# Patient Record
Sex: Female | Born: 1960 | Race: White | Hispanic: No | Smoking: Former smoker
Health system: Southern US, Community
[De-identification: ages and names within clinical notes are randomized; demographics above are authoritative.]

## PROBLEM LIST (undated history)

## (undated) DIAGNOSIS — I69351 Hemiplegia and hemiparesis following cerebral infarction affecting right dominant side: Secondary | ICD-10-CM

## (undated) DIAGNOSIS — R9430 Abnormal result of cardiovascular function study, unspecified: Secondary | ICD-10-CM

## (undated) DIAGNOSIS — R531 Weakness: Secondary | ICD-10-CM

## (undated) DIAGNOSIS — R4701 Aphasia: Secondary | ICD-10-CM

## (undated) DIAGNOSIS — I69320 Aphasia following cerebral infarction: Secondary | ICD-10-CM

## (undated) DIAGNOSIS — E0500 Thyrotoxicosis with diffuse goiter without thyrotoxic crisis or storm: Secondary | ICD-10-CM

## (undated) DIAGNOSIS — E785 Hyperlipidemia, unspecified: Secondary | ICD-10-CM

## (undated) DIAGNOSIS — G629 Polyneuropathy, unspecified: Secondary | ICD-10-CM

## (undated) DIAGNOSIS — L400 Psoriasis vulgaris: Secondary | ICD-10-CM

## (undated) DIAGNOSIS — S43001A Unspecified subluxation of right shoulder joint, initial encounter: Secondary | ICD-10-CM

## (undated) DIAGNOSIS — W19XXXA Unspecified fall, initial encounter: Secondary | ICD-10-CM

## (undated) DIAGNOSIS — I10 Essential (primary) hypertension: Secondary | ICD-10-CM

## (undated) DIAGNOSIS — R931 Abnormal findings on diagnostic imaging of heart and coronary circulation: Secondary | ICD-10-CM

## (undated) DIAGNOSIS — I429 Cardiomyopathy, unspecified: Secondary | ICD-10-CM

## (undated) DIAGNOSIS — I639 Cerebral infarction, unspecified: Secondary | ICD-10-CM

## (undated) DIAGNOSIS — I69891 Dysphagia following other cerebrovascular disease: Secondary | ICD-10-CM

## (undated) DIAGNOSIS — R1310 Dysphagia, unspecified: Secondary | ICD-10-CM

## (undated) DIAGNOSIS — Z8673 Personal history of transient ischemic attack (TIA), and cerebral infarction without residual deficits: Secondary | ICD-10-CM

## (undated) HISTORY — DX: Aphasia following cerebral infarction: I69.320

## (undated) HISTORY — DX: Unspecified fall, initial encounter: W19.XXXA

## (undated) HISTORY — DX: Hemiplegia and hemiparesis following cerebral infarction affecting right dominant side: I69.351

## (undated) HISTORY — DX: Aphasia: R47.01

## (undated) HISTORY — DX: Abnormal result of cardiovascular function study, unspecified: R94.30

## (undated) HISTORY — DX: Cardiomyopathy, unspecified: I42.9

## (undated) HISTORY — DX: Thyrotoxicosis with diffuse goiter without thyrotoxic crisis or storm: E05.00

## (undated) HISTORY — PX: NO PAST SURGERIES: SHX2092

## (undated) HISTORY — DX: Dysphagia following other cerebrovascular disease: I69.891

## (undated) HISTORY — DX: Unspecified subluxation of right shoulder joint, initial encounter: S43.001A

## (undated) HISTORY — DX: Psoriasis vulgaris: L40.0

## (undated) HISTORY — DX: Polyneuropathy, unspecified: G62.9

## (undated) HISTORY — DX: Weakness: R53.1

## (undated) HISTORY — DX: Cerebral infarction, unspecified: I63.9

## (undated) HISTORY — DX: Dysphagia, unspecified: R13.10

## (undated) HISTORY — DX: Hyperlipidemia, unspecified: E78.5

## (undated) HISTORY — DX: Personal history of transient ischemic attack (TIA), and cerebral infarction without residual deficits: Z86.73

## (undated) HISTORY — DX: Essential (primary) hypertension: I10

## (undated) HISTORY — DX: Abnormal findings on diagnostic imaging of heart and coronary circulation: R93.1

---

## 2017-04-24 DIAGNOSIS — I429 Cardiomyopathy, unspecified: Secondary | ICD-10-CM

## 2017-04-24 HISTORY — DX: Cardiomyopathy, unspecified: I42.9

## 2017-05-19 DIAGNOSIS — I639 Cerebral infarction, unspecified: Secondary | ICD-10-CM

## 2017-05-19 DIAGNOSIS — R1310 Dysphagia, unspecified: Secondary | ICD-10-CM

## 2017-05-19 DIAGNOSIS — I69320 Aphasia following cerebral infarction: Secondary | ICD-10-CM

## 2017-05-19 DIAGNOSIS — G629 Polyneuropathy, unspecified: Secondary | ICD-10-CM

## 2017-05-19 HISTORY — DX: Cerebral infarction, unspecified: I63.9

## 2017-05-19 HISTORY — DX: Polyneuropathy, unspecified: G62.9

## 2017-05-19 HISTORY — DX: Aphasia following cerebral infarction: I69.320

## 2017-05-19 HISTORY — DX: Dysphagia, unspecified: R13.10

## 2017-09-16 ENCOUNTER — Ambulatory Visit (HOSPITAL_BASED_OUTPATIENT_CLINIC_OR_DEPARTMENT_OTHER)
Admission: RE | Admit: 2017-09-16 | Discharge: 2017-09-16 | Disposition: A | Discharge status: Home / Self Care | Payer: BLUE CROSS/BLUE SHIELD | Source: Ambulatory Visit | Attending: Family Medicine | Admitting: Family Medicine

## 2017-09-16 ENCOUNTER — Ambulatory Visit (INDEPENDENT_AMBULATORY_CARE_PROVIDER_SITE_OTHER): Payer: BLUE CROSS/BLUE SHIELD | Admitting: Family Medicine

## 2017-09-16 ENCOUNTER — Encounter: Payer: Self-pay | Admitting: Family Medicine

## 2017-09-16 VITALS — BP 109/74 | HR 81 | Temp 97.7°F | Ht 62.0 in | Wt 125.6 lb

## 2017-09-16 DIAGNOSIS — I429 Cardiomyopathy, unspecified: Secondary | ICD-10-CM

## 2017-09-16 DIAGNOSIS — M25511 Pain in right shoulder: Secondary | ICD-10-CM

## 2017-09-16 DIAGNOSIS — R531 Weakness: Secondary | ICD-10-CM

## 2017-09-16 DIAGNOSIS — R4701 Aphasia: Secondary | ICD-10-CM

## 2017-09-16 DIAGNOSIS — L400 Psoriasis vulgaris: Secondary | ICD-10-CM

## 2017-09-16 DIAGNOSIS — E0500 Thyrotoxicosis with diffuse goiter without thyrotoxic crisis or storm: Secondary | ICD-10-CM | POA: Diagnosis not present

## 2017-09-16 DIAGNOSIS — R9430 Abnormal result of cardiovascular function study, unspecified: Secondary | ICD-10-CM

## 2017-09-16 DIAGNOSIS — W19XXXA Unspecified fall, initial encounter: Secondary | ICD-10-CM

## 2017-09-16 DIAGNOSIS — I69351 Hemiplegia and hemiparesis following cerebral infarction affecting right dominant side: Secondary | ICD-10-CM | POA: Diagnosis not present

## 2017-09-16 DIAGNOSIS — R931 Abnormal findings on diagnostic imaging of heart and coronary circulation: Secondary | ICD-10-CM | POA: Diagnosis not present

## 2017-09-16 DIAGNOSIS — G629 Polyneuropathy, unspecified: Secondary | ICD-10-CM

## 2017-09-16 DIAGNOSIS — Z7689 Persons encountering health services in other specified circumstances: Secondary | ICD-10-CM

## 2017-09-16 DIAGNOSIS — S43001A Unspecified subluxation of right shoulder joint, initial encounter: Secondary | ICD-10-CM | POA: Diagnosis not present

## 2017-09-16 DIAGNOSIS — I69391 Dysphagia following cerebral infarction: Secondary | ICD-10-CM | POA: Diagnosis not present

## 2017-09-16 DIAGNOSIS — I639 Cerebral infarction, unspecified: Secondary | ICD-10-CM | POA: Diagnosis not present

## 2017-09-16 DIAGNOSIS — Z8673 Personal history of transient ischemic attack (TIA), and cerebral infarction without residual deficits: Secondary | ICD-10-CM

## 2017-09-16 NOTE — Progress Notes (Signed)
Patient ID: Margaret Ortiz, female  DOB: Nov 08, 1960, 57 y.o.   MRN: [id number] Patient Care Team    Relationship Specialty Notifications Start End  Ma Hillock, DO PCP - General Family Medicine  09/16/17     Chief Complaint  Patient presents with  . Establish Care    pt c/o shoulder pain due to falling down 4 days ago. she states that she also fell two months ago and dislocated her shoulder    Subjective:  Margaret Ortiz is a 57 y.o.  female present for new patient establishment. All past medical history, surgical history, allergies, family history, immunizations, medications and social history were obtained and entered in the electronic medical record today. All recent labs, ED visits and hospitalizations within the last year were reviewed.  Patient is seen today for acute compliant and establishment of care only. She many chronic medical conditions that will need to be addressed on return visit.  History is obtained through sister and brother-in-law, secondary to patient suffering from a aphasia from recent stroke.  She has just moved to this area to live with her sister secondary to disability after her stroke.  No records were received prior to appointment today.  Once received all records will be loaded into electronic medical record and history update again.  Fall: Patient's family reports she fell 4 days ago after being her into their home.  They did not seek immediate treatment.  They state she fell backwards and landed in a bush, fell on her right shoulder.  This is the same shoulder she had dislocated 2 months prior when she had her stroke.  Asked to point to the location of discomfort she points to her deltoid and mid tricep area of her right arm.  Her arm is in a sling today and she is holding her right arm with her left arm.  She has deficits of her right side secondary to stroke 2 months ago.  They are concerned she has a dislocated shoulder.  History of recent CVA with  right hemiparesis: Patient was found down May 19, 2017 was found to be secondary to a left MCA stroke.  Was felt to likely have been cardioembolic from undiagnosed cardiomyopathy since her ejection fraction was found to be 15-20%.  Repeat echo on May 23, 2017 showed improved ejection fraction of 20-25%.  She suffered from dysphasia required an NG tube intially.  He was admitted to acute rehab December 4 to July 01, 2017.  She was upgraded to a regular diet with thin liquids via MBS on June 27, 2017.  Apparently ambulated with assistance at acute rehab.  She had been following in the outpatient setting with  visits including cardiology and neurology appointments. Otherwise she stayed at a SAR ([facility]) until 09/11/2017 when she was discharged and moved to [facility].   Depression screen [facility] 2/9 09/16/2017  Decreased Interest 0  Down, Depressed, Hopeless 0  PHQ - 2 Score 0   No flowsheet data found.   Exercise limited by: neurologic condition(s);cardiac condition(s) Fall Risk  09/16/2017  Falls in the past year? Yes  Number falls in past yr: 2 or more  Injury with Fall? Yes  Comment shoulder dislocation.  Risk Factor Category  High Fall Risk  Risk for fall due to : History of fall(s);Impaired balance/gait;Impaired mobility  Follow up Education provided;Falls evaluation completed     There is no immunization history on file for this patient.  No exam data present  Past Medical  History:  Diagnosis Date  . 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  . Cardiac LV ejection fraction 21-30%   . Cardioembolic stroke ([place]) 54/00/8676   Right hemiparesis  . Cardiomyopathy ([place]) 04/2017  . Dysphagia 05/19/2017   Last known diet upgraded to regular diet with thin liquids on 06/27/2017, no records available  . Graves disease   . Hyperlipidemia   . Hypertension   . Neuropathy 05/19/2017   s/p stroke  . Plaque  psoriasis    Allergies  Allergen Reactions  . Dust Mite Extract   . Penicillins    Past Surgical History:  Procedure Laterality Date  . NO PAST SURGERIES     Family History  Problem Relation Age of Onset  . Hyperlipidemia Mother   . Hypertension Mother   . Early death Father   . Hypertension Sister   . Heart attack Brother   . Mental illness Brother    Social History   Socioeconomic History  . Marital status: Divorced    Spouse name: Not on file  . Number of children: Not on file  . Years of education: 56  . Highest education level: Not on file  Occupational History  . 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 Financial resource strain: Not on file  . Food insecurity:    Worry: Not on file    Inability: Not on file  . Transportation needs:    Medical: Not on file    Non-medical: Not on file  Tobacco Use  . Smoking status: Former Research scientist (life sciences)  . Smokeless tobacco: Never Used  Substance and Sexual Activity  . Alcohol use: Never    Frequency: Never  . Drug use: Never  . Sexual activity: Not Currently  Lifestyle  . Physical activity:    Days per week: Not on file    Minutes per session: Not on file  . Stress: Not on file  Relationships  . Social connections:    Talks on phone: Not on file    Gets together: Not on file    Attends religious service: Not on file    Active member of club or organization: Not on file    Attends meetings of clubs or organizations: Not on file    Relationship status: Not on file  . Intimate partner violence:    Fear of current or ex partner: Not on file    Emotionally abused: Not on file    Physically abused: Not on file    Forced sexual activity: Not on file  Other Topics Concern  . Not on file  Social History Narrative   Divorced.  From [place], moved to [place] to stay with her family after having a stroke March 2019   Graduate degree, works as a Community education officer.   Former smoker.   Exercise routinely prior to stroke.     Wears a hearing aid.   Smoke alarms in the home.   Wears her seatbelt.   Allergies as of 09/16/2017      Reactions   Dust Mite Extract    Penicillins       Medication List        Accurate as of 09/16/17 11:59 PM. Always use your most recent med list.          ACETAMINOPHEN PO Take by mouth.   acetaminophen 325 MG tablet Commonly known as:  TYLENOL Take 650 mg by mouth  every 6 (six) hours as needed.   atorvastatin 40 MG tablet Commonly known as:  LIPITOR Take 40 mg by mouth daily.   docusate sodium 100 MG capsule Commonly known as:  COLACE Take 100 mg by mouth 2 (two) times daily.   ELIQUIS 5 MG Tabs tablet Generic drug:  apixaban Take 5 mg by mouth 2 (two) times daily.   esomeprazole 40 MG capsule Commonly known as:  NEXIUM Take 40 mg by mouth daily at 12 noon.   gabapentin 300 MG capsule Commonly known as:  NEURONTIN Take 300 mg by mouth 1 day or 1 dose.   senna 8.6 MG Tabs tablet Commonly known as:  SENOKOT Take 1 tablet by mouth.   sodium chloride 0.65 % Soln nasal spray Commonly known as:  OCEAN Place 1 spray into both nostrils as needed for congestion.   traMADol 50 MG tablet Commonly known as:  ULTRAM Take by mouth every 4 (four) hours as needed.       All past medical history, surgical history, allergies, family history, immunizations andmedications were updated in the EMR today and reviewed under the history and medication portions of their EMR.    No results found for this or any previous visit (from the past 2160 hour(s)).  Patient was never admitted.   ROS: 14 pt review of systems performed and negative (unless mentioned in an HPI)  Objective: BP 109/74 (BP Location: Left Arm, Patient Position: Sitting, Cuff Size: Normal)   Pulse 81   Temp 97.7 F (36.5 C) (Oral)   Ht 5\' 2"  (1.575 m)   Wt 125 lb 9.6 oz (57 kg)   SpO2 95%   BMI 22.97 kg/m  Gen: Afebrile. No acute distress. Nontoxic in appearance, well-developed, well-nourished,  in a wheelchair.  Difficult to communicate secondary to aphasia. HENT: AT. [place].  MMM, no oral lesions, no cough on exam Eyes:Pupils Equal Round Reactive to light, Extraocular movements intact,  Conjunctiva without redness, discharge or icterus. Neck/lymp/endocrine: Supple CV: RRR no murmurs, rubs or gallops, trace edema Chest: CTAB, no wheeze, rhonchi or crackles. Abd: Soft. NTND. BS present  skin: No bruising.  Warm and well-perfused. Skin intact. Neuro/Msk:  In wheel chair, rt. Hemiparesis, aphasia.  Normal hearing. PERLA. EOMi. Alert. Oriented.  Guarding right arm/shoulder.  difficult to assess shoulder without known mobility.  Patient moves upper body with shoulder when attempting to manipulate uncertain if this protective or she does not understand the directions. Psych: Normal affect, dress and demeanor. Normal speech. Normal thought content and judgment.   Assessment/plan: Margaret Ortiz is a 57 y.o. female present for new patient establishment with initial encounter for a fall in a very complicated patient unknown to this provider.  Extremely limited records available (1 paragraph in a CIR summary) and history mostly obtained from family members present today.  Pt Unable to communicate secondary to aphasia, making case even more difficult. Fall, initial encounter/ Acute pain of right shoulder/Shoulder subluxation, right, initial encounter -The instability and weakness secondary to recent stroke right hemiparesis.  Recently moved into a new location,  unfamiliar with her surroundings fell on right shoulder.  Of note, right shoulder subluxation occurred after stroke as well. 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  There is requested from neurology and cardiology,  as well as PCP.  In obvious need  of home health resources for medication management, PT/OT/speech therapy, safety eval, eval and treat, gait training and strengthening. -Prescribed atorvastatin 40 mg daily, Eliquis 5 mg twice daily. -Gabapentin 300 mg nightly and tramadol 50 mg every 4 hours as needed. - Ambulatory referral to [facility] - Ambulatory referral to Neurology - Ambulatory referral to Cardiology  Cardiomyopathy, unspecified type (HCC)/Cardiac LV ejection fraction 21-30% Extremely limited records.  Records requested.  Last known ejection fraction of 20-25% May 23, 2017.  Cardiomyopathy present.  Cardioembolic stroke. -Only taking atorvastatin 40 mg daily, Eliquis 5 mg twice daily. - Ambulatory referral to Cardiology  Graves disease Awaiting further records  Neuropathy Currently on gabapentin 300 mg nightly - Ambulatory referral to Neurology  Plaque psoriasis Awaiting records   Return in about 2 weeks (around 09/30/2017) for [facility] est.  Greater than 60 minutes was spent with patient, greater than 50% of that time was spent face-to-face with patient counseling and coordinating care.   Note is dictated utilizing voice recognition software. Although note has been proof read prior to signing, occasional typographical errors still can be missed. If any questions arise, please do not hesitate to call for verification.  Electronically signed by: Howard Pouch, DO [employer]

## 2017-09-16 NOTE — Patient Instructions (Signed)
Please have xray completed at [facility] on [address].  After I get these results we will call you and make a plan.  We will also request all records and start referrals to cardiology and neurology and therapy.    Please help [place] help you:  We are honored you have chosen [facility] for your Primary Care home. Below you will find basic instructions that you may need to access in the future. Please help [place] help you by reading the instructions, which cover many of the frequent questions we experience.   Prescription refills and request:  -In order to allow more efficient response time, please call your pharmacy for all refills. They will forward the request electronically to [place]. This allows for the quickest possible response. Request left on a nurse line can take longer to refill, since these are checked as time allows between office patients and other phone calls.  - refill request can take up to 3-5 working days to complete.  - If request is sent electronically and request is appropiate, it is usually completed in 1-2 business days.  - all patients will need to be seen routinely for all chronic medical conditions requiring prescription medications (see follow-up below). If you are overdue for follow up on your condition, you will be asked to make an appointment and we will call in enough medication to cover you until your appointment (up to 30 days).  - all controlled substances will require a face to face visit to request/refill.  - if you desire your prescriptions to go through a new pharmacy, and have an active script at original pharmacy, you will need to call your pharmacy and have scripts transferred to new pharmacy. This is completed between the pharmacy locations and not by your provider.    Results: If any images or labs were ordered, it can take up to 1 week to get results depending on the test ordered and the lab/facility running and resulting the test. - Normal or stable  results, which do not need further discussion, may be released to your mychart immediately with attached note to you. A call may not be generated for normal results. Please make certain to sign up for mychart. If you have questions on how to activate your mychart you can call the front office.  - If your results need further discussion, our office will attempt to contact you via phone, and if unable to reach you after 2 attempts, we will release your abnormal result to your mychart with instructions.  - All results will be automatically released in mychart after 1 week.  - Your provider will provide you with explanation and instruction on all relevant material in your results. Please keep in mind, results and labs may appear confusing or abnormal to the untrained eye, but it does not mean they are actually abnormal for you personally. If you have any questions about your results that are not covered, or you desire more detailed explanation than what was provided, you should make an appointment with your provider to do so.   Our office handles many outgoing and incoming calls daily. If we have not contacted you within 1 week about your results, please check your mychart to see if there is a message first and if not, then contact our office.  In helping with this matter, you help decrease call volume, and therefore allow [place] to be able to respond to patients needs more efficiently.   Acute office visits (  sick visit):  An acute visit is intended for a new problem and are scheduled in shorter time slots to allow schedule openings for patients with new problems. This is the appropriate visit to discuss a new problem. In order to provide you with excellent quality medical care with proper time for you to explain your problem, have an exam and receive treatment with instructions, these appointments should be limited to one new problem per visit. If you experience a new problem, in which you desire to be addressed,  please make an acute office visit, we save openings on the schedule to accommodate you. Please do not save your new problem for any other type of visit, let us take care of it properly and quickly for you.   Follow up visits:  Depending on your condition(s) your provider will need to see you routinely in order to provide you with quality care and prescribe medication(s). Most chronic conditions (Example: hypertension, Diabetes, depression/anxiety... etc), require visits a couple times a year. Your provider will instruct you on proper follow up for your personal medical conditions and history. Please make certain to make follow up appointments for your condition as instructed. Failing to do so could result in lapse in your medication treatment/refills. If you request a refill, and are overdue to be seen on a condition, we will always provide you with a 30 day script (once) to allow you time to schedule.    Medicare wellness (well visit): - we have a wonderful Nurse Maudie Mercury), that will meet with you and provide you will yearly medicare wellness visits. These visits should occur yearly (can not be scheduled less than 1 calendar year apart) and cover preventive health, immunizations, advance directives and screenings you are entitled to yearly through your medicare benefits. Do not miss out on your entitled benefits, this is when medicare will pay for these benefits to be ordered for you.  These are strongly encouraged by your provider and is the appropriate type of visit to make certain you are up to date with all preventive health benefits. If you have not had your medicare wellness exam in the last 12 months, please make certain to schedule one by calling the office and schedule your medicare wellness with Maudie Mercury as soon as possible.   Yearly physical (well visit):  - Adults are recommended to be seen yearly for physicals. Check with your insurance and date of your last physical, most insurances require one  calendar year between physicals. Physicals include all preventive health topics, screenings, medical exam and labs that are appropriate for gender/age and history. You may have fasting labs needed at this visit. This is a well visit (not a sick visit), new problems should not be covered during this visit (see acute visit).  - Pediatric patients are seen more frequently when they are younger. Your provider will advise you on well child visit timing that is appropriate for your their age. - This is not a medicare wellness visit. Medicare wellness exams do not have an exam portion to the visit. Some medicare companies allow for a physical, some do not allow a yearly physical. If your medicare allows a yearly physical you can schedule the medicare wellness with our nurse Maudie Mercury and have your physical with your provider after, on the same day. Please check with insurance for your full benefits.   Late Policy/No Shows:  - all new patients should arrive 15-30 minutes earlier than appointment to allow [place] time  to  obtain all  personal demographics,  insurance information and for you to complete office paperwork. - All established patients should arrive 10-15 minutes earlier than appointment time to update all information and be checked in .  - In our best efforts to run on time, if you are late for your appointment you will be asked to either reschedule or if able, we will work you back into the schedule. There will be a wait time to work you back in the schedule,  depending on availability.  - If you are unable to make it to your appointment as scheduled, please call 24 hours ahead of time to allow [place] to fill the time slot with someone else who needs to be seen. If you do not cancel your appointment ahead of time, you may be charged a no show fee.

## 2017-09-17 ENCOUNTER — Telehealth: Payer: Self-pay | Admitting: Family Medicine

## 2017-09-17 ENCOUNTER — Encounter: Payer: Self-pay | Admitting: Family Medicine

## 2017-09-17 DIAGNOSIS — I69891 Dysphagia following other cerebrovascular disease: Secondary | ICD-10-CM

## 2017-09-17 DIAGNOSIS — Z8673 Personal history of transient ischemic attack (TIA), and cerebral infarction without residual deficits: Secondary | ICD-10-CM

## 2017-09-17 DIAGNOSIS — R4701 Aphasia: Secondary | ICD-10-CM | POA: Insufficient documentation

## 2017-09-17 DIAGNOSIS — S43001A Unspecified subluxation of right shoulder joint, initial encounter: Secondary | ICD-10-CM

## 2017-09-17 DIAGNOSIS — W19XXXA Unspecified fall, initial encounter: Secondary | ICD-10-CM | POA: Insufficient documentation

## 2017-09-17 DIAGNOSIS — I69321 Dysphasia following cerebral infarction: Secondary | ICD-10-CM | POA: Insufficient documentation

## 2017-09-17 DIAGNOSIS — R531 Weakness: Secondary | ICD-10-CM | POA: Insufficient documentation

## 2017-09-17 DIAGNOSIS — E0500 Thyrotoxicosis with diffuse goiter without thyrotoxic crisis or storm: Secondary | ICD-10-CM | POA: Insufficient documentation

## 2017-09-17 DIAGNOSIS — M25511 Pain in right shoulder: Secondary | ICD-10-CM | POA: Insufficient documentation

## 2017-09-17 DIAGNOSIS — L400 Psoriasis vulgaris: Secondary | ICD-10-CM | POA: Insufficient documentation

## 2017-09-17 DIAGNOSIS — I429 Cardiomyopathy, unspecified: Secondary | ICD-10-CM | POA: Insufficient documentation

## 2017-09-17 DIAGNOSIS — I639 Cerebral infarction, unspecified: Secondary | ICD-10-CM | POA: Insufficient documentation

## 2017-09-17 DIAGNOSIS — I69351 Hemiplegia and hemiparesis following cerebral infarction affecting right dominant side: Secondary | ICD-10-CM | POA: Insufficient documentation

## 2017-09-17 DIAGNOSIS — R931 Abnormal findings on diagnostic imaging of heart and coronary circulation: Secondary | ICD-10-CM | POA: Insufficient documentation

## 2017-09-17 DIAGNOSIS — R9430 Abnormal result of cardiovascular function study, unspecified: Secondary | ICD-10-CM | POA: Insufficient documentation

## 2017-09-17 HISTORY — DX: Hemiplegia and hemiparesis following cerebral infarction affecting right dominant side: I69.351

## 2017-09-17 HISTORY — DX: Aphasia: R47.01

## 2017-09-17 HISTORY — DX: Personal history of transient ischemic attack (TIA), and cerebral infarction without residual deficits: Z86.73

## 2017-09-17 HISTORY — DX: Weakness: R53.1

## 2017-09-17 HISTORY — DX: Unspecified subluxation of right shoulder joint, initial encounter: S43.001A

## 2017-09-17 HISTORY — DX: Unspecified fall, initial encounter: W19.XXXA

## 2017-09-17 HISTORY — DX: Dysphagia following other cerebrovascular disease: I69.891

## 2017-09-17 NOTE — Telephone Encounter (Signed)
I called brother-in-law to give appointment information for urgent referral. Appointment 10:30 today at [facility]. Also gave results from xray, read physician note to brother-in-law.

## 2017-09-17 NOTE — Telephone Encounter (Signed)
Noted  

## 2017-09-17 NOTE — Telephone Encounter (Signed)
Please inform patient (family) the following information: Her shoulder is dislocated. I have referred her to orthopedics. Keep in sling until seen. They will call to schedule very soon.  I am working on getting her other referrals concerning her stroke as well and obtaining her full records. We will inform them when we do and when to follow up here again.

## 2017-09-18 ENCOUNTER — Telehealth: Payer: Self-pay | Admitting: Family Medicine

## 2017-09-18 NOTE — Telephone Encounter (Signed)
Please inform patient/family the following information: -Make them aware that I have placed referrals to cardiology, neurology, PT/OT and speech therapy home health.  They should be receiving calls from multiple locations to start scheduling appointments. -We are still waiting on her records here, I hope to have them within the next week.  I would like her to follow-up here in 2-4 weeks to cover all of her chronic medical conditions.

## 2017-09-18 NOTE — Telephone Encounter (Signed)
Spoke with patients brother in law [place] reviewed information and instructions .Richardson Landry verbalized understanding.

## 2017-09-19 ENCOUNTER — Encounter: Payer: Self-pay | Admitting: Neurology

## 2017-09-22 ENCOUNTER — Ambulatory Visit: Payer: Self-pay | Admitting: Family Medicine

## 2017-09-23 DIAGNOSIS — I69398 Other sequelae of cerebral infarction: Secondary | ICD-10-CM

## 2017-09-23 DIAGNOSIS — I69351 Hemiplegia and hemiparesis following cerebral infarction affecting right dominant side: Secondary | ICD-10-CM

## 2017-09-23 DIAGNOSIS — I10 Essential (primary) hypertension: Secondary | ICD-10-CM

## 2017-09-23 DIAGNOSIS — L400 Psoriasis vulgaris: Secondary | ICD-10-CM

## 2017-09-23 DIAGNOSIS — Z7901 Long term (current) use of anticoagulants: Secondary | ICD-10-CM

## 2017-09-23 DIAGNOSIS — S43001D Unspecified subluxation of right shoulder joint, subsequent encounter: Secondary | ICD-10-CM

## 2017-09-23 DIAGNOSIS — Z8639 Personal history of other endocrine, nutritional and metabolic disease: Secondary | ICD-10-CM

## 2017-09-23 DIAGNOSIS — I429 Cardiomyopathy, unspecified: Secondary | ICD-10-CM

## 2017-09-23 DIAGNOSIS — I69320 Aphasia following cerebral infarction: Secondary | ICD-10-CM

## 2017-09-23 DIAGNOSIS — Z87891 Personal history of nicotine dependence: Secondary | ICD-10-CM

## 2017-09-23 DIAGNOSIS — Z9181 History of falling: Secondary | ICD-10-CM

## 2017-09-23 DIAGNOSIS — M6281 Muscle weakness (generalized): Secondary | ICD-10-CM

## 2017-09-24 ENCOUNTER — Telehealth: Payer: Self-pay | Admitting: Family Medicine

## 2017-09-24 NOTE — Telephone Encounter (Signed)
Copied from [place] [phone]. Topic: General - Other >> Sep 24, 2017  2:40 PM Valla Leaver wrote: Reason for CRM: Elsie Lincoln, PT with [facility] calling for verbal orders 2x a wk for 4 wks. He has completed her initial eval.

## 2017-09-24 NOTE — Telephone Encounter (Signed)
Spoke with Ronalee Belts at [facility] care [phone] verbal order given for PT.

## 2017-09-26 ENCOUNTER — Telehealth: Payer: Self-pay | Admitting: Family Medicine

## 2017-09-26 NOTE — Telephone Encounter (Signed)
Copied from [place] [phone]. Topic: Quick Communication - See Telephone Encounter >> Sep 26, 2017  2:23 PM Synthia Innocent wrote: CRM for notification. See Telephone encounter for: 09/26/17. Requesting verbal orders for 2x for 3 weeks for speech therapy

## 2017-09-29 ENCOUNTER — Telehealth: Payer: Self-pay | Admitting: Family Medicine

## 2017-09-29 NOTE — Telephone Encounter (Signed)
Patient experiencing cough, wants to know what otc medication she should take.  Also, requesting refills of the following medications:  Baclofen 10mg  tablet  apixaban (ELIQUIS) 5 MG TABS tablet  esomeprazole (NEXIUM) 40 MG capsule  gabapentin (NEURONTIN) 300 MG capsule  traMADol (ULTRAM) 50 MG tablet   Pharmacy:  [facility]

## 2017-09-29 NOTE — Telephone Encounter (Signed)
Patient has an appt schedule for 10/01/17 left message for patient to bring her medication bottles to her appt along with any medical records she might have.

## 2017-09-29 NOTE — Telephone Encounter (Signed)
Verbal order ok to give.

## 2017-09-29 NOTE — Telephone Encounter (Signed)
I have yet to receive any records on this patient.  She was seen 2 weeks ago for establish appointment with an acute issue. -At the very least I would like to have patient come in for labs and bring her medication bottles with her so that we can verify medications and doses.  30 minute appointment.

## 2017-10-01 ENCOUNTER — Encounter: Payer: Self-pay | Admitting: Family Medicine

## 2017-10-01 ENCOUNTER — Ambulatory Visit (INDEPENDENT_AMBULATORY_CARE_PROVIDER_SITE_OTHER): Payer: BLUE CROSS/BLUE SHIELD | Admitting: Family Medicine

## 2017-10-01 VITALS — BP 120/80 | HR 76 | Temp 98.2°F | Resp 20 | Ht 62.0 in | Wt 125.0 lb

## 2017-10-01 DIAGNOSIS — I639 Cerebral infarction, unspecified: Secondary | ICD-10-CM

## 2017-10-01 DIAGNOSIS — R4701 Aphasia: Secondary | ICD-10-CM

## 2017-10-01 DIAGNOSIS — Z79899 Other long term (current) drug therapy: Secondary | ICD-10-CM | POA: Diagnosis not present

## 2017-10-01 DIAGNOSIS — S43001A Unspecified subluxation of right shoulder joint, initial encounter: Secondary | ICD-10-CM

## 2017-10-01 DIAGNOSIS — I429 Cardiomyopathy, unspecified: Secondary | ICD-10-CM | POA: Diagnosis not present

## 2017-10-01 DIAGNOSIS — E0500 Thyrotoxicosis with diffuse goiter without thyrotoxic crisis or storm: Secondary | ICD-10-CM | POA: Diagnosis not present

## 2017-10-01 DIAGNOSIS — G629 Polyneuropathy, unspecified: Secondary | ICD-10-CM | POA: Diagnosis not present

## 2017-10-01 DIAGNOSIS — R531 Weakness: Secondary | ICD-10-CM

## 2017-10-01 DIAGNOSIS — R931 Abnormal findings on diagnostic imaging of heart and coronary circulation: Secondary | ICD-10-CM

## 2017-10-01 DIAGNOSIS — R9430 Abnormal result of cardiovascular function study, unspecified: Secondary | ICD-10-CM

## 2017-10-01 DIAGNOSIS — Z8673 Personal history of transient ischemic attack (TIA), and cerebral infarction without residual deficits: Secondary | ICD-10-CM

## 2017-10-01 DIAGNOSIS — I69351 Hemiplegia and hemiparesis following cerebral infarction affecting right dominant side: Secondary | ICD-10-CM

## 2017-10-01 DIAGNOSIS — I69891 Dysphagia following other cerebrovascular disease: Secondary | ICD-10-CM

## 2017-10-01 DIAGNOSIS — L400 Psoriasis vulgaris: Secondary | ICD-10-CM | POA: Diagnosis not present

## 2017-10-01 DIAGNOSIS — Z7901 Long term (current) use of anticoagulants: Secondary | ICD-10-CM

## 2017-10-01 LAB — CBC
HCT: 41.4 % (ref 36.0–46.0)
Hemoglobin: 13.6 g/dL (ref 12.0–15.0)
MCHC: 32.9 g/dL (ref 30.0–36.0)
MCV: 91.1 fl (ref 78.0–100.0)
Platelets: 332 10*3/uL (ref 150.0–400.0)
RBC: 4.55 Mil/uL (ref 3.87–5.11)
RDW: 14.3 % (ref 11.5–15.5)
WBC: 7.1 10*3/uL (ref 4.0–10.5)

## 2017-10-01 LAB — COMPREHENSIVE METABOLIC PANEL
ALT: 21 U/L (ref 0–35)
AST: 22 U/L (ref 0–37)
Albumin: 4.4 g/dL (ref 3.5–5.2)
Alkaline Phosphatase: 90 U/L (ref 39–117)
BUN: 11 mg/dL (ref 6–23)
CO2: 29 mEq/L (ref 19–32)
CREATININE: 0.84 mg/dL (ref 0.40–1.20)
Calcium: 10.2 mg/dL (ref 8.4–10.5)
Chloride: 101 mEq/L (ref 96–112)
GFR: 74.39 mL/min (ref >60.00)
GLUCOSE: 85 mg/dL (ref 70–99)
Potassium: 4.7 mEq/L (ref 3.5–5.1)
SODIUM: 136 meq/L (ref 135–145)
TOTAL PROTEIN: 7.3 g/dL (ref 6.0–8.3)
Total Bilirubin: 0.8 mg/dL (ref 0.2–1.2)

## 2017-10-01 LAB — T4, FREE: FREE T4: 0.99 ng/dL (ref 0.60–1.60)

## 2017-10-01 LAB — TSH: TSH: 1.27 u[IU]/mL (ref 0.35–4.50)

## 2017-10-01 LAB — T3, FREE: T3, Free: 3.8 pg/mL (ref 2.3–4.2)

## 2017-10-01 MED ORDER — GABAPENTIN 300 MG PO CAPS: 600.0000 mg | ORAL_CAPSULE | Freq: Every day | ORAL | 1 refills | Status: DC

## 2017-10-01 MED ORDER — BACLOFEN 5 MG PO TABS: 5.0000 mg | ORAL_TABLET | Freq: Three times a day (TID) | ORAL | 1 refills | Status: DC

## 2017-10-01 MED ORDER — ATORVASTATIN CALCIUM 40 MG PO TABS: 40.0000 mg | ORAL_TABLET | Freq: Every day | ORAL | 1 refills | Status: DC

## 2017-10-01 MED ORDER — APIXABAN 5 MG PO TABS
5.0000 mg | ORAL_TABLET | Freq: Two times a day (BID) | ORAL | 1 refills | Status: DC
Start: 2017-10-01 — End: 2018-03-16

## 2017-10-01 MED ORDER — CLOBETASOL PROPIONATE 0.05 % EX CREA: 1.0000 "application " | TOPICAL_CREAM | Freq: Two times a day (BID) | CUTANEOUS | 0 refills | Status: DC

## 2017-10-01 MED ORDER — ESOMEPRAZOLE MAGNESIUM 40 MG PO CPDR: 40.0000 mg | DELAYED_RELEASE_CAPSULE | Freq: Every day | ORAL | 1 refills | Status: DC

## 2017-10-01 NOTE — Progress Notes (Signed)
Lynzy, Rawles May 08, 1961, 57 y.o., female MRN: [id number] Patient Care Team    Relationship Specialty Notifications Start End  Ma Hillock, DO PCP - General Family Medicine  09/16/17     Chief Complaint  Patient presents with  . Establish Care    chronic conditions     Subjective: Pt presents with brother-in-law and sister today, who help with HPI.    Shoulder pain: Much improved. Seen ortho. Mobility is improving.  Prior note:  Patient's family reports she fell 4 days ago after being her into their home.  They did not seek immediate treatment.  They state she fell backwards and landed in a bush, fell on her right shoulder.  This is the same shoulder she had dislocated 2 months prior when she had her stroke.  Asked to point to the location of discomfort she points to her deltoid and mid tricep area of her right arm.  Her arm is in a sling today and she is holding her right arm with her left arm.  She has deficits of her right side secondary to stroke 2 months ago.  They are concerned she has a dislocated shoulder.  History of recent CVA with right hemiparesis:   Graves disease Pt reports she has graves disease and had to take medications, but doe snot recall when that was and if she has been tested recently.   Dysphagia Taking omeprazole. Not having nay continue difficulties. Working with Theme park manager.   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 Compliant with atorvastatin and eliquis. Taking gabapentin 300 mg QHS. Still having trouble sleeping. Was not taking baclofen. Feels like she occassionally get shaky, especially after PT.  Prior note: FULL RECORDS STILL NOT RECEIVED 10/01/2017 Patient was found down May 19, 2017 was found to be secondary to a left MCA stroke.  Was felt to likely have been cardioembolic from undiagnosed cardiomyopathy since her  ejection fraction was found to be 15-20%.  Repeat echo on May 23, 2017 showed improved ejection fraction of 20-25%.  She suffered from dysphasia required an NG tube intially.  SHe was admitted to acute rehab December 4 to July 01, 2017.  She was upgraded to a regular diet with thin liquids via MBS on June 27, 2017.  Apparently ambulated with assistance at acute rehab.  She had been following in the outpatient setting with  visits including cardiology and neurology appointments. Otherwise she stayed at a SAR (CIR) until 09/11/2017 when she was discharged and moved to [facility].  Cardiomyopathy, unspecified type (HCC)/Cardiac LV ejection fraction 21-30% She has establishment appt with Dr. Chelsea Aus scheduled. Still awaiting records from prior PCP and cardio.    Depression screen [facility] 2/9 09/16/2017  Decreased Interest 0  Down, Depressed, Hopeless 0  PHQ - 2 Score 0    Allergies  Allergen Reactions  . Dust Mite Extract   . Penicillins    Social History   Tobacco Use  . Smoking status: Former Research scientist (life sciences)  . Smokeless tobacco: Never Used  Substance Use Topics  . Alcohol use: Never    Frequency: Never   Past Medical History:  Diagnosis Date  . 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  . Cardiac LV ejection fraction 21-30%   . Cardioembolic stroke ([place]) 66/44/0347   Right hemiparesis  . Cardiomyopathy ([place]) 04/2017  . Dysphagia 05/19/2017  Last known diet upgraded to regular diet with thin liquids on 06/27/2017, no records available  . Graves disease   . Hyperlipidemia   . Hypertension   . Neuropathy 05/19/2017   s/p stroke  . Plaque psoriasis    Past Surgical History:  Procedure Laterality Date  . NO PAST SURGERIES     Family History  Problem Relation Age of Onset  . Hyperlipidemia Mother   . Hypertension Mother   . Early death Father   . Hypertension Sister   . Heart attack Brother   .  Mental illness Brother    Allergies as of 10/01/2017      Reactions   Dust Mite Extract    Penicillins       Medication List        Accurate as of 10/01/17 10:18 AM. Always use your most recent med list.          acetaminophen 325 MG tablet Commonly known as:  TYLENOL Take 650 mg by mouth every 6 (six) hours as needed.   atorvastatin 40 MG tablet Commonly known as:  LIPITOR Take 40 mg by mouth daily.   baclofen 10 MG tablet Commonly known as:  LIORESAL Take 1 tablet by mouth 3 (three) times daily.   docusate sodium 100 MG capsule Commonly known as:  COLACE Take 100 mg by mouth 2 (two) times daily.   ELIQUIS 5 MG Tabs tablet Generic drug:  apixaban Take 5 mg by mouth 2 (two) times daily.   esomeprazole 40 MG capsule Commonly known as:  NEXIUM Take 40 mg by mouth daily at 12 noon.   gabapentin 300 MG capsule Commonly known as:  NEURONTIN Take 300 mg by mouth at bedtime.   senna 8.6 MG Tabs tablet Commonly known as:  SENOKOT Take 1 tablet by mouth.   sodium chloride 0.65 % Soln nasal spray Commonly known as:  OCEAN Place 1 spray into both nostrils as needed for congestion.   traMADol 50 MG tablet Commonly known as:  ULTRAM Take by mouth every 4 (four) hours as needed.       All past medical history, surgical history, allergies, family history, immunizations andmedications were updated in the EMR today and reviewed under the history and medication portions of their EMR.     ROS: Negative, with the exception of above mentioned in HPI   Objective:  BP 120/80 (BP Location: Left Arm, Patient Position: Sitting, Cuff Size: Normal)   Pulse 76   Temp 98.2 F (36.8 C)   Resp 20   Ht 5\' 2"  (1.575 m)   Wt 125 lb (56.7 kg)   SpO2 97%   BMI 22.86 kg/m  Body mass index is 22.86 kg/m. Gen: Afebrile. No acute distress. Nontoxic in appearance, well developed, well nourished. Walking with cane today. Speech is slurred, but much improved . HENT: AT. [place].  MMM Eyes:Pupils Equal Round Reactive to light, Extraocular movements intact,  Conjunctiva without redness, discharge or icterus. CV: RRR no murmur, noedema Chest: CTAB, no wheeze or crackles. Good air movement, normal resp effort.  Abd: Soft. NTND. BS present. no Masses palpated. No rebound or guarding.  Skin: WWW. Intact.   Neuro/MSK:  walking with cane and leg brace today. PERLA. EOMi. Alert. Oriented x3. Doing great, talking, communication better. Has very little movement.mobility and control of right arm but has improved. RLE foot/heel brace in place, moving leg well, foot drop an issue.  Psych: Normal affect, dress and demeanor. Normal thought content and judgment.  No exam data present No results found. No results found for this or any previous visit (from the past 24 hour(s)).  Assessment/Plan: Margaret Ortiz is a 57 y.o. female present for OV for  Fall, initial encounter/ Acute pain of right shoulder/Shoulder subluxation, right, initial encounter - much improved. 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 - pt is working hard with PT/OT/speech therapy, safety eval, eval and treat, gait training and strengthening. She is doing rather well and is walking with assistance today. Speech has also improved.  - Prescribed atorvastatin 40 mg daily, Eliquis 5 mg twice daily.--> refilled today - Gabapentin 600 mg nightly.--> increased to 600 mg QHS - continue baclofen 5 mg TID. - Ambulatory referral to Home Health--> working with and doing well.  - Ambulatory referral to Neurology--> now scheduled - Ambulatory referral to Cardiology--> now scheduled - f/u 4-8 weeks as she graduates from [facility] and will need outpatient neuro rehab Cardiomyopathy, unspecified type (HCC)/Cardiac LV ejection fraction 21-30% Still awaiting  records, multiple attempts to receive.Last known ejection fraction of 20-25% May 23, 2017.  Cardiomyopathy present.  Cardioembolic stroke. - taking atorvastatin 40 mg daily, Eliquis 5 mg twice daily. - Ambulatory referral to Cardiology scheduled.   Graves disease Awaiting further records. Labs collected today  Neuropathy Currently on gabapentin 300 mg nightly, still having symptoms, increase to 600 mg QHS - Ambulatory referral to Neurology scheduled.   Plaque psoriasis Awaiting records. Clobetasol cream ordered today.    Reviewed expectations re: course of current medical issues.  Discussed self-management of symptoms.  Outlined signs and symptoms indicating need for more acute intervention.  Patient verbalized understanding and all questions were answered.  Patient received an After-Visit Summary.    No orders of the defined types were placed in this encounter.    Note is dictated utilizing voice recognition software. Although note has been proof read prior to signing, occasional typographical errors still can be missed. If any questions arise, please do not hesitate to call for verification.   electronically signed by:  Howard Pouch, DO  [place]

## 2017-10-01 NOTE — Patient Instructions (Signed)
I have refilled all your medications.  Followup with me after PT releases you from home health and we will get you to outpatient neuro rehab. Probably about 4 weeks, no longer than 3 months.   You are doing really well!! Keep up the good work!

## 2017-10-03 ENCOUNTER — Telehealth: Payer: Self-pay | Admitting: Family Medicine

## 2017-10-03 NOTE — Telephone Encounter (Signed)
Copied from [place] [id number]. Topic: Quick Communication - See Telephone Encounter >> Oct 03, 2017  9:56 AM Robina Ade, Helene Kelp D wrote: CRM for notification. See Telephone encounter for: 10/03/17. Bruce with [facility] called requesting verbal orders as follow: 2X for 3 week for occupational therapy. He can be reached at [phone].

## 2017-10-03 NOTE — Telephone Encounter (Signed)
Verbal order given as requested. 

## 2017-10-14 ENCOUNTER — Encounter: Payer: Self-pay | Admitting: Family Medicine

## 2017-10-14 ENCOUNTER — Ambulatory Visit (INDEPENDENT_AMBULATORY_CARE_PROVIDER_SITE_OTHER): Payer: BLUE CROSS/BLUE SHIELD | Admitting: Family Medicine

## 2017-10-14 VITALS — BP 113/75 | HR 67 | Temp 98.2°F | Ht 62.0 in | Wt 124.0 lb

## 2017-10-14 DIAGNOSIS — I69891 Dysphagia following other cerebrovascular disease: Secondary | ICD-10-CM | POA: Diagnosis not present

## 2017-10-14 DIAGNOSIS — I69351 Hemiplegia and hemiparesis following cerebral infarction affecting right dominant side: Secondary | ICD-10-CM

## 2017-10-14 DIAGNOSIS — S43001A Unspecified subluxation of right shoulder joint, initial encounter: Secondary | ICD-10-CM

## 2017-10-14 NOTE — Progress Notes (Signed)
Margaret Ortiz, Margaret Ortiz, 57 y.o., female MRN: [id number] Patient Care Team    Relationship Specialty Notifications Start End  Ma Hillock, DO PCP - General Family Medicine  09/16/17     Chief Complaint  Patient presents with  . Follow-up    [facility]     Subjective: Pt presents with brother-in-law and sister today, who help with HPI.    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 Compliant with atorvastatin, baclofen, gabapentin, PPI and eliquis. Making great progress with HH and graduating to outpatient program. She is excited to start at  the outpatient facility.  Prior note: FULL RECORDS STILL NOT RECEIVED 10/01/2017 Patient was found down May 19, 2017 was found to be secondary to a left MCA stroke.  Was felt to likely have been cardioembolic from undiagnosed cardiomyopathy since her ejection fraction was found to be 15-20%.  Repeat echo on May 23, 2017 showed improved ejection fraction of 20-25%.  She suffered from dysphasia required an NG tube intially.  SHe was admitted to acute rehab December 4 to July 01, 2017.  She was upgraded to a regular diet with thin liquids via MBS on June 27, 2017.  Apparently ambulated with assistance at acute rehab.  She had been following in the outpatient setting with  visits including cardiology and neurology appointments. Otherwise she stayed at a SAR (CIR) until 09/11/2017 when she was discharged and moved to [facility].  Cardiomyopathy, unspecified type (HCC)/Cardiac LV ejection fraction 21-30% She has establishment appt with Dr. Chelsea Aus scheduled. Records recived from prior cardiology.   Depression screen [facility] 2/9 09/16/2017  Decreased Interest 0  Down, Depressed, Hopeless 0  PHQ - 2 Score 0    Allergies  Allergen Reactions  . Dust Mite Extract   . Penicillins    Social History   Tobacco Use  . Smoking  status: Former Research scientist (life sciences)  . Smokeless tobacco: Never Used  Substance Use Topics  . Alcohol use: Never    Frequency: Never   Past Medical History:  Diagnosis Date  . 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  . Cardiac LV ejection fraction 21-30%   . Cardioembolic stroke ([place]) 73/22/0254   Right hemiparesis  . Cardiomyopathy ([place]) 04/2017  . Dysphagia 05/19/2017   Last known diet upgraded to regular diet with thin liquids on 06/27/2017, no records available  . Graves disease   . Hyperlipidemia   . Hypertension   . Neuropathy 05/19/2017   s/p stroke  . Plaque psoriasis    Past Surgical History:  Procedure Laterality Date  . NO PAST SURGERIES     Family History  Problem Relation Age of Onset  . Hyperlipidemia Mother   . Hypertension Mother   . Early death Father   . Hypertension Sister   . Heart attack Brother   . Mental illness Brother    Allergies as of 10/14/2017      Reactions   Dust Mite Extract    Penicillins       Medication List        Accurate as of 10/14/17 11:23 AM. Always use your most recent med list.          acetaminophen 325 MG tablet Commonly known as:  TYLENOL Take 650 mg by mouth every 6 (six) hours as needed.   apixaban 5 MG Tabs tablet Commonly  known as:  ELIQUIS Take 1 tablet (5 mg total) by mouth 2 (two) times daily.   atorvastatin 40 MG tablet Commonly known as:  LIPITOR Take 1 tablet (40 mg total) by mouth daily.   Baclofen 5 MG Tabs Take 5 mg by mouth 3 (three) times daily.   clobetasol cream 0.05 % Commonly known as:  TEMOVATE Apply 1 application topically 2 (two) times daily.   esomeprazole 40 MG capsule Commonly known as:  NEXIUM Take 1 capsule (40 mg total) by mouth daily at 12 noon.   gabapentin 300 MG capsule Commonly known as:  NEURONTIN Take 2 capsules (600 mg total) by mouth at bedtime.   senna 8.6 MG Tabs tablet Commonly known  as:  SENOKOT Take 1 tablet by mouth.   sodium chloride 0.65 % Soln nasal spray Commonly known as:  OCEAN Place 1 spray into both nostrils as needed for congestion.       All past medical history, surgical history, allergies, family history, immunizations andmedications were updated in the EMR today and reviewed under the history and medication portions of their EMR.     ROS: Negative, with the exception of above mentioned in HPI   Objective:  BP 113/75 (BP Location: Left Arm, Patient Position: Sitting, Cuff Size: Normal)   Pulse 67   Temp 98.2 F (36.8 C) (Oral)   Ht 5\' 2"  (1.575 m)   Wt 124 lb (56.2 kg)   SpO2 96%   BMI 22.68 kg/m  Body mass index is 22.68 kg/m.  Gen: Afebrile. No acute distress.  HENT: AT. [place].MMM.  Eyes:Pupils Equal Round Reactive to light, Extraocular movements intact,  Conjunctiva without redness, discharge or icterus. CV: RRR, no murmur, no edema, +2/4 P posterior tibialis pulses Chest: CTAB, no wheeze or crackles Abd: Soft. NTND. BS present Skin:  WWW. Intact. Neuro: walking holding cane up today, using for back up. PERLA. EOMi. Alert. Oriented x 3. Talking better, improved RUE, still very weak, but has some hand grasping strength returned since last visit. RLE foot/hheel brace in place.  Psych: Normal affect, dress and demeanor. Normal speech. Normal thought content and judgment..    No exam data present No results found. No results found for this or any previous visit (from the past 24 hour(s)).  Assessment/Plan: Margaret Ortiz is a 57 y.o. 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 - pt is working hard with PT/OT/speech therapy, safety eval, eval and treat, gait training and strengthening. She is doing great  and is walking with assistance today (cane for back up). Speech has also  improved greatly.  - Continue atorvastatin 40 mg daily, Eliquis 5 mg twice daily. - Gabapentin 600 mg nightly. - continue baclofen 5 mg TID. -  Home Health--> graduated HH--> referral to outpatient rehab for PT/OT and speech. - Ambulatory referral to Neurology--> now scheduled - Ambulatory referral to Cardiology--> now scheduled  Cardiomyopathy, unspecified type (HCC)/Cardiac LV ejection fraction 21-30% Last known ejection fraction of 20-25% May 23, 2017.  Cardiomyopathy present.  Cardioembolic stroke. - Continue taking atorvastatin 40 mg daily, Eliquis 5 mg twice daily. - Ambulatory referral to Cardiology scheduled. Records from prior cardiology received and scanned. A hard copy was also provided to pt to take to the cardiologist   Neuropathy Currently on gabapentin 600 mg nightly - Ambulatory referral to Neurology scheduled.    Reviewed expectations re: course  of current medical issues.  Discussed self-management of symptoms.  Outlined signs and symptoms indicating need for more acute intervention.  Patient verbalized understanding and all questions were answered.  Patient received an After-Visit Summary.    No orders of the defined types were placed in this encounter.    Note is dictated utilizing voice recognition software. Although note has been proof read prior to signing, occasional typographical errors still can be missed. If any questions arise, please do not hesitate to call for verification.   electronically signed by:  Howard Pouch, DO  [place]

## 2017-10-14 NOTE — Patient Instructions (Signed)
I have copied your cardiology notes, take the copy to  cardiology appt, just in case the scanned copies are not uploaded yet.   I have referred you to the outpatient Neuro rehab for PT/OT and speech. If they do not do speech therapy there or need extra referral just call in and I will place that referral in the system for you.    You are doing great.

## 2017-10-15 ENCOUNTER — Telehealth: Payer: Self-pay | Admitting: Family Medicine

## 2017-10-15 NOTE — Telephone Encounter (Signed)
Spoke with Ebony Hail all orders are in [employer]. She states she will get patient scheduled.

## 2017-10-15 NOTE — Telephone Encounter (Signed)
Copied from [place] #[id number]. Topic: Quick Communication - See Telephone Encounter >> Oct 15, 2017  9:30 AM Percell Belt A wrote: CRM for notification. See Telephone encounter for: 10/15/17. Ebony Hail speak therapist [facility] - [phone] Need orders for outpatient therapy for speak therapy, OT, and PT  They would like her to go to [facility] out patient - once they have order Ebony Hail would like a call back to help sch appt for pt Fax number is [phone]

## 2017-10-15 NOTE — Telephone Encounter (Signed)
Please check referrals tab. This was completed yesterday during her appt for [facility], if there is something else that needs to be ordered I will be happy to do it. Placed order for Pt/OT and speech yesterday.

## 2017-10-17 ENCOUNTER — Encounter: Payer: Self-pay | Admitting: Interventional Cardiology

## 2017-10-22 ENCOUNTER — Encounter: Payer: Self-pay | Admitting: Neurology

## 2017-10-27 ENCOUNTER — Encounter: Payer: Self-pay | Admitting: Rehabilitative and Restorative Service Providers"

## 2017-10-27 ENCOUNTER — Other Ambulatory Visit: Payer: Self-pay

## 2017-10-27 ENCOUNTER — Ambulatory Visit: Payer: BLUE CROSS/BLUE SHIELD | Admitting: Occupational Therapy

## 2017-10-27 ENCOUNTER — Ambulatory Visit
Payer: BLUE CROSS/BLUE SHIELD | Attending: Family Medicine | Admitting: Rehabilitative and Restorative Service Providers"

## 2017-10-27 DIAGNOSIS — R29818 Other symptoms and signs involving the nervous system: Secondary | ICD-10-CM | POA: Diagnosis present

## 2017-10-27 DIAGNOSIS — R208 Other disturbances of skin sensation: Secondary | ICD-10-CM | POA: Insufficient documentation

## 2017-10-27 DIAGNOSIS — R41842 Visuospatial deficit: Secondary | ICD-10-CM | POA: Insufficient documentation

## 2017-10-27 DIAGNOSIS — R471 Dysarthria and anarthria: Secondary | ICD-10-CM | POA: Diagnosis present

## 2017-10-27 DIAGNOSIS — M6281 Muscle weakness (generalized): Secondary | ICD-10-CM

## 2017-10-27 DIAGNOSIS — R278 Other lack of coordination: Secondary | ICD-10-CM | POA: Diagnosis present

## 2017-10-27 DIAGNOSIS — I69318 Other symptoms and signs involving cognitive functions following cerebral infarction: Secondary | ICD-10-CM | POA: Diagnosis present

## 2017-10-27 DIAGNOSIS — R4701 Aphasia: Secondary | ICD-10-CM | POA: Insufficient documentation

## 2017-10-27 DIAGNOSIS — R2681 Unsteadiness on feet: Secondary | ICD-10-CM

## 2017-10-27 DIAGNOSIS — M25511 Pain in right shoulder: Secondary | ICD-10-CM | POA: Diagnosis present

## 2017-10-27 DIAGNOSIS — I69351 Hemiplegia and hemiparesis following cerebral infarction affecting right dominant side: Secondary | ICD-10-CM

## 2017-10-27 DIAGNOSIS — G8929 Other chronic pain: Secondary | ICD-10-CM

## 2017-10-27 DIAGNOSIS — R2689 Other abnormalities of gait and mobility: Secondary | ICD-10-CM

## 2017-10-27 NOTE — Therapy (Signed)
[facility] [address] [place], [place], [address] Phone: [phone]   Fax:  [phone]  Occupational Therapy Evaluation  Patient Details  Name: Margaret Ortiz MRN: [id number] Date of Birth: [birth year] Referring Provider: Howard Pouch, DO   Encounter Date: 10/27/2017  OT End of Session - 10/27/17 1334    Visit Number  1    Number of Visits  24    Date for OT Re-Evaluation  12/27/17    Authorization Type  BC/BS    OT Start Time  1015    OT Stop Time  1100    OT Time Calculation (min)  45 min    Activity Tolerance  Patient tolerated treatment well    Behavior During Therapy  [facility] for tasks assessed/performed       Past Medical History:  Diagnosis Date  . 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  . Cardiac LV ejection fraction 21-30%   . Cardioembolic stroke ([place]) 81/85/6314   Right hemiparesis  . Cardiomyopathy ([place]) 04/2017  . Dysphagia 05/19/2017   Last known diet upgraded to regular diet with thin liquids on 06/27/2017, no records available  . Graves disease   . Hyperlipidemia   . Hypertension   . Neuropathy 05/19/2017   s/p stroke  . Plaque psoriasis     Past Surgical History:  Procedure Laterality Date  . NO PAST SURGERIES      There were no vitals filed for this visit.  Subjective Assessment - 10/27/17 1023    Patient is accompained by:  Family member Sister and brother-n-law    Pertinent History  Lt MCA CVA 05/19/2017, Graves dz    Currently in Pain?  Yes    Pain Score  -- none today, but up to 7/10    Pain Location  Shoulder    Pain Orientation  Right    Pain Descriptors / Indicators  Aching    Pain Type  Acute pain    Pain Onset  More than a month ago    Pain Frequency  Intermittent    Aggravating Factors   malpositioning    Pain Relieving Factors  exercising it        [facility] OT Assessment -  10/27/17 1029      Assessment   Medical Diagnosis  CVA Rt dominant side hemiparesis    Referring Provider  Howard Pouch, DO    Onset Date/Surgical Date  05/19/17    Hand Dominance  Right    Prior Therapy  Had therapy in IP rehab [place], [place] and home health [place], [place]      Precautions   Precautions  Fall    Precaution Comments  has had a fall, Rt shoulder subluxation    Other Brace/Splint  R AFO      Restrictions   Weight Bearing Restrictions  No      Balance Screen   Has the patient fallen in the past 6 months  Yes    How many times?  2      Home  Environment   Additional Comments  Currently living with sister and brother-n-law since March 2019 in 2 story house with basement, 2 steps to enter from back. Pt stays on 1st floor. (home in [place] has 4 steps to enter, then 2 story home with only 1/2 bath on 1st floor)     Lives With  Alone prior to stroke  Prior Function   Level of Independence  Independent    Vocation  Full time employment    Journalist, newspaper in Cardiovascular studies at [employer]    Leisure  Exercise      ADL   Eating/Feeding  Needs assist with cutting food eating w/ Lt non dominant hand    Grooming  Minimal assistance assist to style hair, uses Lt non dominant hand    Upper Body Bathing  Maximal assistance    Lower Body Bathing  Maximal assistance    Upper Body Dressing  Increased time    Lower Body Dressing  Increased time    Toilet Transfer  Modified independent    Toileting - Clothing Manipulation  Modified independent    Toileting -  Hygiene  Modified Independent    Tub/Shower Transfer  Minimal assistance      IADL   Shopping  Completely unable to shop    Light Housekeeping  Does not participate in any housekeeping tasks    Meal Prep  -- only gets simple snack, cannot get coffee     Community Mobility  Relies on family or friends for transportation    Medication Management  Has difficulty remembering to  take medication;Takes responsibility if medication is prepared in advance in seperate dosage    Financial Management  Requires assistance      Mobility   Mobility Status  Needs assist    Mobility Status Comments  walks with quad cane      Written Expression   Dominant Hand  Right      Vision - History   Baseline Vision  Wears glasses only for reading    Additional Comments  blurred vision, occasional diplopia w/o glasses      Cognition   Overall Cognitive Status  Difficult to assess    Cognition Comments  noted dysarthria, difficulty expressing deficits      Observation/Other Assessments   Observations  subluxation Rt shoulder, Rt scapula winging. Pt does have 50% scapula elevation and retraction/protraction. Pt appears to have inattention to Rt and Lt sides, but difficult to determine d/t possible visual and cognitive deficits - will need to assess further but unable today d/t time constraints.       Sensation   Light Touch  Impaired by gross assessment not formally assessed d/t time constraints      Coordination   Gross Motor Movements are Fluid and Coordinated  No    Fine Motor Movements are Fluid and Coordinated  No    Coordination  NO functional movement RUE, only approx 25% gross finger flexion dominated by synergy pattern      Edema   Edema  very mild edema Rt hand      Tone   Assessment Location  Right Upper Extremity      ROM / Strength   AROM / PROM / Strength  AROM;PROM      AROM   Overall AROM Comments  Pt has little A/ROM - approx 50% scapula elevation, protraction, and retraction, and some elbow and finger flexion dominated by synergy pattern      PROM   Overall PROM Comments  Pt can tolerate approx 75% passive shoulder flex and abduction when facilitating scapula. Elbow distally WFL's passively      RUE Tone   RUE Tone  Hypotonic                      OT Education - 10/27/17 1353  Education provided  Yes    Education Details  bed  positioning for hemiplegic side, OT POC    Person(s) Educated  Patient family   family   Methods  Handout    Comprehension  Verbalized understanding       OT Short Term Goals - 10/27/17 1352      OT SHORT TERM GOAL #1   Title  Pt/family independent with HEP for RUE    Time  4    Period  Weeks    Status  New    Target Date  11/27/17      OT SHORT TERM GOAL #2   Title  Independent with splint wear and care for Rt hand prn    Time  4    Period  Weeks    Status  New      OT SHORT TERM GOAL #3   Title  Pt/family to verbalize understanding with A/E and task modifications to increase indpendence with ADLS/IADLS (for bathing, tying shoes, cutting food, simple meal prep)    Time  4    Period  Weeks    Status  New      OT SHORT TERM GOAL #4   Title  Pt to perform bathing with only min assist and A/E prn while seated     Time  4    Period  Weeks    Status  New      OT SHORT TERM GOAL #5   Title  Pt to make sandwich w/ only supervision/cues prn    Time  4    Period  Weeks    Status  New      Additional Short Term Goals   Additional Short Term Goals  Yes      OT SHORT TERM GOAL #6   Title  Pt to demo 25 degrees shoulder flexion in prep for low level reaching with gross finger flexion    Time  4    Period  Weeks    Status  New      OT SHORT TERM GOAL #7   Title  Pt/family to verbalize understanding with strategies to increase independence and safety with medication management including memory strategies prn     Time  4    Period  Weeks    Status  New        OT Long Term Goals - 10/27/17 1357      OT LONG TERM GOAL #1   Title  Pt to perform shower transfers at mod I level w/ DME prn and no LOB    Time  8    Period  Weeks    Status  New    Target Date  12/27/17      OT LONG TERM GOAL #2   Title  Pt to perform simple meal prep at sup level using A/E and task modifications prn    Time  8    Period  Weeks    Status  New      OT LONG TERM GOAL #3   Title  Pt to  perform light house management tasks at sup level including: laundry, washing dishes, cleaning, making bed    Time  8    Period  Weeks    Status  New      OT LONG TERM GOAL #4   Title  Pt to demo 25% finger extension in prep for releasing objects Rt hand     Time  8  Period  Weeks    Status  New      OT LONG TERM GOAL #5   Title  Pt to perform environmental scanning while performing simple physical task at 90% accuracy    Time  8    Period  Weeks    Status  New      Long Term Additional Goals   Additional Long Term Goals  Yes      OT LONG TERM GOAL #6   Title  Pt to use Rt hand as stabalizer 25% of the time    Time  8    Period  Weeks    Status  New            Plan - 10/27/17 1344    Clinical Impression Statement  Pt is a 57 y.o. female who presents to outpatient rehab s/p Lt MCA CVA on 05/19/17 with significant residual Rt dominant side UE hemiplegia. Pt also with pain in Rt shoulder, decreased balance, visual and cognitive deficits from stroke. Pt was living alone in [place], [place] prior to stroke and working full time in research at [employer] currently living with sister and her husband and needs assist with all ADLS at this time.     Occupational Profile and client history currently impacting functional performance  no significant PMH but current impairments extensive    Occupational performance deficits (Please refer to evaluation for details):  ADL's;IADL's;Work;Leisure;Social Participation    Rehab Potential  Fair    Current Impairments/barriers affecting progress:  severity of deficits    OT Frequency  3x / week    OT Duration  8 weeks may reduce to 2x/wk when appropriate, or if pt has insurance visit limitations    OT Treatment/Interventions  Self-care/ADL training;Moist Heat;DME and/or AE instruction;Splinting;Therapeutic activities;Psychosocial skills training;Aquatic Therapy;Cognitive remediation/compensation;Therapeutic exercise;Coping strategies  training;Neuromuscular education;Functional Mobility Training;Passive range of motion;Visual/perceptual remediation/compensation;Electrical Stimulation;Manual Therapy;Patient/family education    Plan  Further assess vision, sensation, and inattention as able; add/adjust goals prn.  Assess current splint and fabricate new resting hand splint prn. If not required, update/initiate HEP prn    Clinical Decision Making  Several treatment options, min-mod task modification necessary    Consulted and Agree with Plan of Care  Patient;Family member/caregiver    Family Member Consulted  sister and brother-n-law       Patient will benefit from skilled therapeutic intervention in order to improve the following deficits and impairments:  Decreased coordination, Decreased range of motion, Difficulty walking, Improper body mechanics, Decreased endurance, Decreased safety awareness, Impaired sensation, Improper spinal/pelvic alignment, Impaired tone, Decreased knowledge of precautions, Decreased knowledge of use of DME, Decreased balance, Impaired UE functional use, Pain, Decreased cognition, Decreased mobility, Decreased strength, Impaired perceived functional ability, Impaired vision/preception  Visit Diagnosis: Hemiplegia and hemiparesis following cerebral infarction affecting right dominant side ([place]) - Plan: Ot plan of care cert/re-cert  Unsteadiness on feet - Plan: Ot plan of care cert/re-cert  Other symptoms and signs involving cognitive functions following cerebral infarction - Plan: Ot plan of care cert/re-cert  Visuospatial deficit - Plan: Ot plan of care cert/re-cert  Other disturbances of skin sensation - Plan: Ot plan of care cert/re-cert  Chronic right shoulder pain - Plan: Ot plan of care cert/re-cert  Muscle weakness (generalized) - Plan: Ot plan of care cert/re-cert  Other lack of coordination - Plan: Ot plan of care cert/re-cert    Problem List Patient Active Problem List   Diagnosis  Date Noted  . Fall 09/17/2017  .  Hemiparesis of right dominant side as late effect of cerebral infarction ([place]) 09/17/2017  . Shoulder subluxation, right, initial encounter 09/17/2017  . Cardioembolic stroke ([place]) 56/25/6389  . Weakness 09/17/2017  . Aphasia 09/17/2017  . 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 Cardiomyopathy ([place]) 09/17/2017  . H/O ischemic left MCA stroke 09/17/2017  . Cardiac LV ejection fraction 21-30%   . Graves disease   . Plaque psoriasis   . Neuropathy 05/19/2017    Carey Bullocks, OTR/L 10/27/2017, 2:06 PM  [facility] [address] [place], [place], [address] Phone: [phone]   Fax:  [phone]  Name: Margaret Ortiz MRN: [id number] Date of Birth: 15-Dec-1960

## 2017-10-28 NOTE — Therapy (Signed)
[facility] [address] [place], [place], [address] Phone: [phone]   Fax:  [phone]  Physical Therapy Evaluation  Patient Details  Name: Margaret Ortiz MRN: [id number] Date of Birth: [birth year] Referring Provider: Howard Pouch, DO   Encounter Date: 10/27/2017  PT End of Session - 10/27/17 0627    Visit Number  1    Number of Visits  17    Date for PT Re-Evaluation  12/27/17    Authorization Type  [employer] *awaiting information on plan    PT Start Time  0935    PT Stop Time  1020    PT Time Calculation (min)  45 min    Equipment Utilized During Treatment  Gait belt    Activity Tolerance  Patient tolerated treatment well    Behavior During Therapy  [facility] for tasks assessed/performed       Past Medical History:  Diagnosis Date  . 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  . Cardiac LV ejection fraction 21-30%   . Cardioembolic stroke ([place]) 29/79/8921   Right hemiparesis  . Cardiomyopathy ([place]) 04/2017  . Dysphagia 05/19/2017   Last known diet upgraded to regular diet with thin liquids on 06/27/2017, no records available  . Graves disease   . Hyperlipidemia   . Hypertension   . Neuropathy 05/19/2017   s/p stroke  . Plaque psoriasis     Past Surgical History:  Procedure Laterality Date  . NO PAST SURGERIES      There were no vitals filed for this visit.   Subjective Assessment - 10/27/17 0945    Subjective  The patient is s/p CVA 05/19/2017 with hospitalization in [place].  She moved to [facility] in 08/2017 to live with her sister and brother in law.  Her cc: imbalance on her feet and she is still needing pain medicine for her shoulder.  She notes the brace bothers her with walking (she is wearing a right AFO).      Patient is accompained by:  Family member sister and brother in Sports coach.    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      Currently in Pain?  No/denies no pain at rest; has h/o shoulder pain    Pain Score  -- does have intermittent pain.         [facility] PT Assessment - 10/27/17 0953      Assessment   Medical Diagnosis  CVA    Referring Provider  Howard Pouch, DO    Onset Date/Surgical Date  05/19/17    Prior Therapy  Had therapy in IP rehab [place], [place] and home health [place], [place]      Precautions   Precautions  Fall    Precaution Comments  has had a fall    Required Braces or Orthoses  Other Brace/Splint    Other Brace/Splint  R AFO      Restrictions   Weight Bearing Restrictions  No      Balance Screen   Has the patient fallen in the past 6 months  Yes    How many times?  2 or 3    Has the patient had a decrease in activity level because of a fear of falling?   Yes due to stroke  in 04/2017    Is the patient reluctant to leave their home because of a fear of falling?   No      Home Social worker  Private residence    Living Arrangements  -- sibling- moved to [place] to enter    Entrance [place] of Steps  2 uses back entrance    [place] - quad;Shower seat;Wheelchair - manual    Additional Comments  She has 4 steps to front porch in [place] and then 14 steps to go upstairs      Prior Function   Level of Independence  Independent    Vocation  Full time employment    Journalist, newspaper in Cardiovascular studies    Leisure  Exercise      Cognition   Overall Cognitive Status  -- hard to assess due to aphasia- see ST note      Sensation   Light Touch  -- diminished right foot      Posture/Postural Control   Posture Comments  Shifts weight to the left side with dec'd right  weight bearing; arm maintained in sling with IR and forward right shoulder      ROM / Strength   AROM / PROM / Strength  AROM;Strength      Strength   Overall Strength Comments  L LE is 4/5 for hip flexion knee extension, knee fleixon, ankle DF.  R hip flexion is 2/5, knee extension 3/5, knee flexion 2/5, *did not isolate ankle.  Muscle tone may be hindering accurate MMT R LE.       Ambulation/Gait   Ambulation/Gait  Yes    Ambulation/Gait Assistance  5: Supervision notes ambulates mod indep with SBQC in home per sister's rep    Ambulation/Gait Assistance Details  Patient has decreased knee flexion/hip flexion at initial swing phase R side leading to R rotation of the hips in the transverse plane to initiate R swing phase.  She has dec'd right foot clearance with some circumduction.  She responds well to tactile cues for R hip initiation.    Ambulation Distance (Feet)  150 Feet    Assistive device  Small based quad cane    Gait Pattern  Decreased stance time - right;Decreased step length - left;Decreased stride length;Decreased hip/knee flexion - right;Decreased dorsiflexion - right;Decreased weight shift to right;Right circumduction;Right hip hike;Lateral hip instability;Poor foot clearance - right R arm held in flexion with sling.    Ambulation Surface  Level;Indoor    Gait velocity  38.60 seconds/ 0.85 ft/sec pace.     Stairs  Yes    Stairs Assistance  4: Min assist    Stair Management Technique  Step to pattern;One rail Left    Number of Stairs  4    Gait Comments  Patient wears R custom, articulating AFO.  She uses circumduction on stairs to advance the right LE.      Standardized Balance Assessment   Standardized Balance Assessment  Berg Balance Test      Berg Balance Test   Sit to Stand  Able to stand  independently using hands    Standing Unsupported  Able to stand safely 2 minutes    Sitting with Back Unsupported but Feet Supported on Floor or Stool  Able to sit safely and  securely 2 minutes    Stand to  Sit  Controls descent by using hands    Transfers  Able to transfer safely, definite need of hands    Standing Unsupported with Eyes Closed  Able to stand 10 seconds with supervision    Standing Ubsupported with Feet Together  Able to place feet together independently and stand for 1 minute with supervision    From Standing, Reach Forward with Outstretched Arm  Reaches forward but needs supervision    From Standing Position, Pick up Object from [place] to pick up shoe, needs supervision    From Standing Position, Turn to Look Behind Over each Shoulder  Looks behind from both sides and weight shifts well    Turn 360 Degrees  Needs assistance while turning    Standing Unsupported, Alternately Place Feet on Step/Stool  Needs assistance to keep from falling or unable to try    Standing Unsupported, One Foot in Front  Needs help to step but can hold 15 seconds    Standing on One Leg  Unable to try or needs assist to prevent fall    Total Score  32    Berg comment:  32/56 indicating high fall risk.                Objective measurements completed on examination: See above findings.              PT Education - 10/27/17 1527    Education provided  Yes    Education Details  PT plan of care    Person(s) Educated  Patient    Methods  Explanation    Comprehension  Verbalized understanding       PT Short Term Goals - 10/27/17 1761      PT SHORT TERM GOAL #1   Title  The patient will perform HEP with cues from family for LE strength, standing balance, and motor control.    Baseline  STG due date 11/27/2017    Time  4    Period  Weeks    Target Date  11/27/17      PT SHORT TERM GOAL #2   Title  The patient will improve gait speed from 0.85 ft/sec to > or equal to 1.4 ft/sec to demonstrate transition from "household ambulator" to "limited community ambulator" classification of gait.    Time  4    Period  Weeks    Target Date  11/27/17       PT SHORT TERM GOAL #3   Title  The patient will improve Berg balance score from 32/56 to > or equal to 38/56 to demo dec'ing risk for falls.    Time  4    Period  Weeks    Target Date  11/27/17      PT SHORT TERM GOAL #4   Title  The patient will negotiate 4 steps with one rail and step to pattern mod indep.    Time  4    Period  Weeks    Target Date  11/27/17      PT SHORT TERM GOAL #5   Title  The patient will ambulate x 500 ft nonstop mod indep for improving access to community surfaces.    Time  4    Period  Weeks    Target Date  11/27/17        PT Long Term Goals - 10/28/17 0740      PT LONG TERM GOAL #1   Title  The patient will perform HEP with  intermittent assist for post d/c progression of exercise.    Time  8    Period  Weeks    Target Date  12/27/17      PT LONG TERM GOAL #2   Title  The patient will move floor<>stand with mod indep using L UE support on surface due to h/o falls.    Time  8    Period  Weeks    Target Date  12/27/17      PT LONG TERM GOAL #3   Title  The patient will improve gait speed from 0.85 ft/sec to > or equal to 1.8 ft/sec to demo dec'ing risk for falls.    Time  8    Period  Weeks    Target Date  12/27/17      PT LONG TERM GOAL #4   Title  The patient will improve Berg score from 32/56 to > or equal to 44/56 to demo dec'ing risk for falls.    Time  8    Period  Weeks    Target Date  12/27/17      PT LONG TERM GOAL #5   Title  The patient will negotiate 12 steps without a handrail with step to pattern mod indep to access home entries.    Time  8    Period  Weeks    Target Date  12/27/17             Plan - 10/27/17 0743    Clinical Impression Statement  The patient is a 57 year old female s/p left MCA stroke 05/19/2017 presenting to OP physical therapy with impairments in right LE motor control, right UE motor control (OT addressing), balance, gait, sensation. and endurance.  Anticipate some visual/perceptual  impairments that need further assessment.  Impairments are limiting her role in work activities, household activities, community integration, and recreational tasks.  The patient is currently residing with family due to need for supervision.      History and Personal Factors relevant to plan of care:  L MCA, aphasia, R hemimotor impairments, falls, cardiomyopathy, graves disease, shoulder subluxation s/p CVA    Clinical Presentation  Evolving    Clinical Presentation due to:  recent h/o falls, moving in with sister/family    Clinical Decision Making  Moderate    Rehab Potential  Good    Clinical Impairments Affecting Rehab Potential  Ability to follow commands hindered during evaluation, patient appears motivated to participate    PT Frequency  2x / week eval +    PT Duration  8 weeks    PT Treatment/Interventions  ADLs/Self Care Home Management;Gait training;Stair training;Functional mobility training;Therapeutic activities;Therapeutic exercise;Balance training;Neuromuscular re-education;Patient/family education;Orthotic Fit/Training;Electrical Stimulation;Manual techniques;DME Instruction    PT Next Visit Plan  Establish HEP, gait training (in clinic without SBQC to encourage more reciprocal pattern), set up on Bioness functional e-stim (*PT reviewing precautions for cardiomyopathy?), R side strengthening, hip initiation during gait tasks, right LE loading/weight shifting.    Consulted and Agree with Plan of Care  Family member/caregiver;Patient       Patient will benefit from skilled therapeutic intervention in order to improve the following deficits and impairments:  Abnormal gait, Impaired sensation, Pain, Postural dysfunction, Impaired tone, Decreased mobility, Decreased coordination, Decreased activity tolerance, Decreased endurance, Decreased strength, Difficulty walking, Decreased balance  Visit Diagnosis: Other abnormalities of gait and mobility  Muscle weakness (generalized)  Other  symptoms and signs involving the nervous system     Problem List Patient Active  Problem List   Diagnosis Date Noted  . Fall 09/17/2017  . Hemiparesis of right dominant side as late effect of cerebral infarction ([place]) 09/17/2017  . Shoulder subluxation, right, initial encounter 09/17/2017  . Cardioembolic stroke ([place]) 63/89/3734  . Weakness 09/17/2017  . Aphasia 09/17/2017  . 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 Cardiomyopathy ([place]) 09/17/2017  . H/O ischemic left MCA stroke 09/17/2017  . Cardiac LV ejection fraction 21-30%   . Graves disease   . Plaque psoriasis   . Neuropathy 05/19/2017    Sheanna Dail,PT 10/28/2017, 7:55 AM  [facility] [address] [place], [place], [address] Phone: [phone]   Fax:  [phone]  Name: Lilyann Gravelle MRN: [id number] Date of Birth: 01/02/1961

## 2017-11-03 ENCOUNTER — Other Ambulatory Visit: Payer: Self-pay

## 2017-11-03 ENCOUNTER — Ambulatory Visit: Payer: BLUE CROSS/BLUE SHIELD | Admitting: Speech Pathology

## 2017-11-03 DIAGNOSIS — R471 Dysarthria and anarthria: Secondary | ICD-10-CM

## 2017-11-03 DIAGNOSIS — R4701 Aphasia: Secondary | ICD-10-CM

## 2017-11-03 DIAGNOSIS — R2689 Other abnormalities of gait and mobility: Secondary | ICD-10-CM | POA: Diagnosis not present

## 2017-11-04 ENCOUNTER — Ambulatory Visit (INDEPENDENT_AMBULATORY_CARE_PROVIDER_SITE_OTHER): Payer: BLUE CROSS/BLUE SHIELD | Admitting: Interventional Cardiology

## 2017-11-04 ENCOUNTER — Encounter: Payer: Self-pay | Admitting: Interventional Cardiology

## 2017-11-04 ENCOUNTER — Encounter (INDEPENDENT_AMBULATORY_CARE_PROVIDER_SITE_OTHER): Payer: Self-pay

## 2017-11-04 VITALS — BP 102/74 | HR 67 | Ht 62.0 in | Wt 120.0 lb

## 2017-11-04 DIAGNOSIS — I639 Cerebral infarction, unspecified: Secondary | ICD-10-CM

## 2017-11-04 DIAGNOSIS — I429 Cardiomyopathy, unspecified: Secondary | ICD-10-CM | POA: Diagnosis not present

## 2017-11-04 DIAGNOSIS — I340 Nonrheumatic mitral (valve) insufficiency: Secondary | ICD-10-CM

## 2017-11-04 DIAGNOSIS — I5022 Chronic systolic (congestive) heart failure: Secondary | ICD-10-CM

## 2017-11-04 MED ORDER — METOPROLOL TARTRATE 25 MG PO TABS: 12.5000 mg | ORAL_TABLET | Freq: Two times a day (BID) | ORAL | 3 refills | Status: DC

## 2017-11-04 NOTE — Progress Notes (Signed)
Cardiology Office Note   Date:  11/04/2017   ID:  Margaret Ortiz, DOB 1960-12-30, MRN [id number]  PCP:  Howard Pouch A, DO    No chief complaint on file.  Systolic heart failure  Wt Readings from Last 3 Encounters:  11/04/17 120 lb (54.4 kg)  10/14/17 124 lb (56.2 kg)  10/01/17 125 lb (56.7 kg)       History of Present Illness: Margaret Ortiz is a 57 y.o. female  Who had a stroke in November 2018.  Per the records: "Patient was found down May 19, 2017 was found to be secondary to a left MCA stroke. Was felt to likely have been cardioembolic from undiagnosed cardiomyopathy since her ejection fraction was found to be 15-20%. Repeat echo on November 30, 2018showed improved ejection fraction of 20-25%. She suffered from dysphasia required an NG tubeintially. SHe was admitted to acute rehab December 4 to July 01, 2017. She was upgraded to a regular diet with thin liquids via MBS on June 27, 2017. Apparently ambulated with assistance at acute rehab. She had been following in the outpatient setting withvisits including cardiology and neurology appointments. Otherwise she stayed at a [facility] ([facility]) until 09/11/2017 when she was discharged and moved to [facility]. Cardiomyopathy, unspecified type (HCC)/Cardiac LV ejection fraction 21-30%"  Most recent echocardiogram done in [place] in February 2019 showed ejection fraction 15 to 20% with mild to moderate mitral regurgitation.  She continues to exercise daily.  She is in OP rehab.  SHe is doing the exercises that PT gaive as an inpatient.    She was started on lisinopril but she had low BP.    History is obtained with the help of the patient's sister and brother-in-law.  Past Medical History:  Diagnosis Date  . 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  . Cardiac LV ejection fraction 21-30%   . Cardioembolic stroke ([place]) 63/33/5456   Right  hemiparesis  . Cardiomyopathy ([place]) 04/2017  . Dysphagia 05/19/2017   Last known diet upgraded to regular diet with thin liquids on 06/27/2017, no records available  . Graves disease   . Hyperlipidemia   . Hypertension   . Neuropathy 05/19/2017   s/p stroke  . Plaque psoriasis     Past Surgical History:  Procedure Laterality Date  . NO PAST SURGERIES       Current Outpatient Medications  Medication Sig Dispense Refill  . acetaminophen (TYLENOL) 325 MG tablet Take 650 mg by mouth every 6 (six) hours as needed.    Marland Kitchen apixaban (ELIQUIS) 5 MG TABS tablet Take 1 tablet (5 mg total) by mouth 2 (two) times daily. 180 tablet 1  . atorvastatin (LIPITOR) 40 MG tablet Take 1 tablet (40 mg total) by mouth daily. 90 tablet 1  . baclofen 5 MG TABS Take 5 mg by mouth 3 (three) times daily. 270 tablet 1  . clobetasol cream (TEMOVATE) 2.56 % Apply 1 application topically 2 (two) times daily. 30 g 0  . esomeprazole (NEXIUM) 40 MG capsule Take 1 capsule (40 mg total) by mouth daily at 12 noon. 90 capsule 1  . gabapentin (NEURONTIN) 300 MG capsule Take 2 capsules (600 mg total) by mouth at bedtime. 180 capsule 1  . senna (SENOKOT) 8.6 MG TABS tablet Take 1 tablet by mouth.    . sodium chloride (OCEAN) 0.65 % SOLN nasal spray Place 1 spray into both nostrils as needed for congestion.    Marland Kitchen  metoprolol tartrate (LOPRESSOR) 25 MG tablet Take 0.5 tablets (12.5 mg total) by mouth 2 (two) times daily. 90 tablet 3   No current facility-administered medications for this visit.     Allergies:   Dust mite extract and Penicillins    Social History:  The patient  reports that she has quit smoking. She has never used smokeless tobacco. She reports that she does not drink alcohol or use drugs.   Family History:  The patient's family history includes Early death in her father; Heart attack in her brother; Hyperlipidemia in her mother; Hypertension in her mother and sister; Mental illness in her brother.    ROS:   Please see the history of present illness.   Otherwise, review of systems are positive for difficulty with speech since her stroke.   All other systems are reviewed and negative.    PHYSICAL EXAM: VS:  BP 102/74   Pulse 67   Ht 5\' 2"  (1.575 m)   Wt 120 lb (54.4 kg)   SpO2 98%   BMI 21.95 kg/m  , BMI Body mass index is 21.95 kg/m. GEN: Well nourished, well developed, in no acute distress  HEENT: normal  Neck: no JVD, carotid bruits, or masses Cardiac: RRR; no murmurs, rubs, or gallops,no edema  Respiratory:  clear to auscultation bilaterally, normal work of breathing GI: soft, nontender, nondistended, + BS MS: no deformity or atrophy  Skin: warm and dry, no rash Neuro:  Strength and sensation are intact Psych: euthymic mood, full affect   EKG:   The ekg ordered today demonstrates normal sinus rhythm, septal Q waves, no ST-T wave changes   Recent Labs: 10/01/2017: ALT 21; BUN 11; Creatinine, Ser 0.84; Hemoglobin 13.6; Platelets 332.0; Potassium 4.7; Sodium 136; TSH 1.27   Lipid Panel No results found for: CHOL, TRIG, HDL, CHOLHDL, VLDL, LDLCALC, LDLDIRECT   Other studies Reviewed: Additional studies/ records that were reviewed today with results demonstrating: Prior echocardiograms reviewed.   ASSESSMENT AND PLAN:  1. Chronic systolic heart failure : She appears euvolemic.  She did not tolerate lisinopril apparently due to low blood pressure.  We will try low-dose beta-blocker to see if her LV systolic function improves.  We will plan echocardiogram in 3 months. 2. Prior CVA: She is now taking Eliquis for anticoagulation due to suspicion that this was from a cardioembolic source.  She did not have any mention of an LV thrombus on echo.  She is certainly at higher risk of atrial fibrillation given her structural heart disease.  She is not having any bleeding problems.  Continue anticoagulation for now. 3. Mitral regurgitation: Mild to moderate by last echo.  No evidence of heart  failure on exam.   Current medicines are reviewed at length with the patient today.  The patient concerns regarding her medicines were addressed.  The following changes have been made:  No change  Labs/ tests ordered today include:   Orders Placed This Encounter  Procedures  . EKG 12-Lead  . ECHOCARDIOGRAM COMPLETE    Recommend 150 minutes/week of aerobic exercise Low fat, low carb, high fiber diet recommended  Disposition:   FU in 4 months   Signed, Larae Grooms, MD  11/04/2017 2:59 PM    [facility] [address], [place], [place]  [address] Phone: [phone]; Fax: [phone]

## 2017-11-04 NOTE — Therapy (Signed)
[facility] [address] [place], [place], [address] Phone: [phone]   Fax:  [phone]  Speech Language Pathology Evaluation  Patient Details  Name: Margaret Ortiz MRN: [id number] Date of Birth: January 23, 1961 Referring Provider: Ma Hillock, DO   Encounter Date: 11/03/2017  End of Session - 11/03/17 1820    Visit Number  1    Number of Visits  17    Date for SLP Re-Evaluation  01/09/18    Authorization Type  BCBS    SLP Start Time  0840    SLP Stop Time   0932    SLP Time Calculation (min)  52 min    Activity Tolerance  Patient tolerated treatment well       Past Medical History:  Diagnosis Date  . 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  . Cardiac LV ejection fraction 21-30%   . Cardioembolic stroke ([place]) 96/22/2979   Right hemiparesis  . Cardiomyopathy ([place]) 04/2017  . Dysphagia 05/19/2017   Last known diet upgraded to regular diet with thin liquids on 06/27/2017, no records available  . Graves disease   . Hyperlipidemia   . Hypertension   . Neuropathy 05/19/2017   s/p stroke  . Plaque psoriasis     Past Surgical History:  Procedure Laterality Date  . NO PAST SURGERIES      There were no vitals filed for this visit.  Subjective Assessment - 11/03/17 0849    Subjective  "i probably have st-stomich."    Patient is accompained by:  Family member Houria    Currently in Pain?  No/denies         SLP Evaluation [facility] - 11/03/17 0849      SLP Visit Information   SLP Received On  11/03/17    Referring Provider  Howard Pouch A, DO    Onset Date  05/19/2017    Medical Diagnosis  L MCA CVA      Subjective   Patient/Family Stated Goal  go back to work      General Information   HPI  Margaret Ortiz is a 57 y.o. female who had Lt MCA CVA 05/19/2017, with resulting right hemiparesis, aphasia, dysphagia (Last  known diet upgraded to regular diet with thin liquids on 06/27/2017), hx Graves dz. She had therapy in IP rehab [place], [place] and home health [place], [place].    Behavioral/Cognition  alert, cooperative    Mobility Status  ambulates with cane      Balance Screen   Has the patient fallen in the past 6 months  Yes currently seeing PT    How many times?  2      Prior Functional Status   Cognitive/Linguistic Baseline  Within functional limits     Lives With  Other (Comment) sister    Available Support  Family    Education  Ph.D from [employer]   Overall Cognitive Status  -- Will assess further; suspect language>cognitive impairments    Attention  Focused;Sustained;Selective    Focused Attention  Appears intact    Sustained Attention  Appears intact    Selective Attention  Appears intact      Auditory Comprehension   Overall Auditory Comprehension  Impaired    Yes/No Questions  Within Functional Limits    Commands  Impaired    One Step Basic Commands  75-100% accurate  Two Step Basic Commands  75-100% accurate    Multistep Basic Commands  -- 3+ steps impaired    Complex Commands  25-49% accurate    Conversation  Simple    EffectiveTechniques  Pausing;Repetition;Other (Comment);Visual/Gestural cues rephrasing      Visual Recognition/Discrimination   Discrimination  Within Function Limits      Reading Comprehension   Reading Status  Impaired    Word level  76-100% accurate    Sentence Level  51-75% accurate 75%    Paragraph Level  Not tested      Expression   Primary Mode of Expression  Verbal      Verbal Expression   Overall Verbal Expression  Impaired    Initiation  No impairment    Automatic Speech  Name;Social Response;Counting days (70%), months impaired (40% accuracy)    Level of Generative/Spontaneous Verbalization  Sentence    Repetition  Impaired    Level of Impairment  Sentence level    Naming  Impairment    Responsive  0-25% accurate 20%     Confrontation  0-24% accurate    Convergent  -- 2/15 BNT short form    Pragmatics  No impairment    Non-Verbal Means of Communication  Not applicable      Written Expression   Dominant Hand  Right    Written Expression  Not tested will assess further      Oral Motor/Sensory Function   Overall Oral Motor/Sensory Function  Impaired    Labial ROM  Reduced right    Labial Symmetry  Abnormal symmetry right    Labial Strength  Within Functional Limits    Lingual ROM  Within Functional Limits    Lingual Symmetry  Within Functional Limits    Lingual Strength  Within Functional Limits    Facial ROM  Reduced right    Facial Symmetry  Right droop    Velum  Within Functional Limits    Mandible  Within Functional Limits      Motor Speech   Overall Motor Speech  Impaired    Respiration  Impaired    Level of Impairment  Word    Phonation  Breathy;Low vocal intensity    Resonance  Within functional limits    Articulation  Within functional limitis    Intelligibility  Intelligibility reduced    Sentence  75-100% accurate ~85% in quiet environment; paraphasias/neologisms contribute    Motor Planning  Witnin functional limits    Phonation  Impaired    Volume  Soft    Pitch  Low      Standardized Assessments   Standardized Assessments   [place] Diagnostic Apashia Examiniation-3rd    [place] Diagnostic Apashia Examiniation-3rd edition   Mild right sided CN VII deficits impacting facial ROM; voice is low intensity and breathy, however palate elevation intact bilaterally. Suspect pt is using decreased vocal effort due to her insecurity re: language deficits vs vocal cord impairment. Picture description is fluent with English-like syntax, consisting of primarily neologisms, paraphasias with some intact phrases ("taking a cookie", "they are in a kitchen"). Word comprehension 15/16, Commands 7/10 (multistep and complex impaired), complex ideational material 5/6. Automatized sequences 3/4; numbers 1-21  intact; days of the week 5/7, months 4/12 with perseverations and paraphasias, single word repetition 5/5 (paraphasias), sentence 1/2, responsive naming 2/10, word ID 3/4, oral reading of words 5/5, oral reading with comprehension 5/5, sentences and paragraphs 3/4.  SLP Education - 11/03/17 1822    Education provided  Yes    Education Details  aphasia education handout, ST plan of care    Person(s) Educated  Patient;Other (comment) sister   sister   Methods  Explanation    Comprehension  Verbalized understanding       SLP Short Term Goals - 11/03/17 0845      SLP SHORT TERM GOAL #1   Title  Pt will complete simple naming tasks with error awareness 80% accuracy with occasional min A (compensations allowed)    Time  4    Period  Weeks    Status  New      SLP SHORT TERM GOAL #2   Title  Pt will participate functionally in 5 minutes simple conversation with conversational supports for aphasia over 3 sessions.     Time  4    Period  Weeks    Status  New      SLP SHORT TERM GOAL #3   Title  Pt will comprehend 3-step/3 component verbal directions with occasional min A 80% accuracy    Time  4    Period  Weeks    Status  New      SLP SHORT TERM GOAL #4   Title  Pt will undergo further assessment of writing, reading comprehension.    Time  4    Period  Weeks    Status  New      SLP SHORT TERM GOAL #5   Title  Pt will demo understanding of 8 minutes of simple conversation over 3 sessions with compensations for comprehension    Time  4    Period  Weeks    Status  New       SLP Long Term Goals - 11/03/17 0845      SLP LONG TERM GOAL #1   Title  Pt will participate functionally in 10 minutes simple-mod complex conversation with conversational supports for aphasia over 3 sessions.    Time  8    Period  Weeks    Status  New      SLP LONG TERM GOAL #2   Title  Pt will demo understanding of 10 minutes simple-mod complex conversation with  compensations over 3 sessions    Time  8    Period  Weeks    Status  New      SLP LONG TERM GOAL #3   Title  Pt will demo error awareness by attempting correction or by nonverbal response to errors with 90% success    Time  8    Period  Weeks    Status  New       Plan - 11/03/17 1819    Clinical Impression Statement  Ms. Lalley presents with moderate-severe Wernicke's aphasia, with greater expressive vs receptive impairments, resulting from L MCA in 04/2017. Vocal quality is breathy and low intensity; this appears to be behavioral vs due to CN impairment. Speech is fluent with English-like syntax, with frequent neologisms, paraphasias and occasionally intact words and phrases. Auditory comprehension impaired for multi-step and complex commands. Naming 2/15, however pt able to identify 15/15 from f:4 written words. 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 Pt is bilingual; her first language is [place]. She has a goal to return to work again. She is eating a regular diet with thin liquids and denies difficulties. Cognition, writing, voice not formally assessed today;  will assess as appropriate during future sessions and add goals if necessary. I recommend skilled ST to address aphasia in order to improve functional communication and quality of life.     Speech Therapy Frequency  2x / week    Duration  -- 8 weeks or 16 additional visits    Treatment/Interventions  Multimodal communcation approach;Compensatory strategies;Language facilitation;Compensatory techniques;Cueing hierarchy;Internal/external aids;Functional tasks;SLP instruction and feedback;Patient/family education    Potential to Achieve Goals  Good    Potential Considerations  Other (comment);Severity of impairments 6 months post-onset    SLP Home Exercise Plan  provided    Consulted and Agree with Plan of Care  Patient;Family member/caregiver       Patient will benefit from  skilled therapeutic intervention in order to improve the following deficits and impairments:   Aphasia  Dysarthria and anarthria    Problem List Patient Active Problem List   Diagnosis Date Noted  . Fall 09/17/2017  . Hemiparesis of right dominant side as late effect of cerebral infarction ([place]) 09/17/2017  . Shoulder subluxation, right, initial encounter 09/17/2017  . Cardioembolic stroke ([place]) 12/75/1700  . Weakness 09/17/2017  . Aphasia 09/17/2017  . 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 Cardiomyopathy ([place]) 09/17/2017  . H/O ischemic left MCA stroke 09/17/2017  . Cardiac LV ejection fraction 21-30%   . Graves disease   . Plaque psoriasis   . Neuropathy 05/19/2017   Deneise Lever, [place], [place] 11/04/2017, 8:07 AM  [facility] [address] [place], [place], [address] Phone: [phone]   Fax:  [phone]  Name: Annise Boran MRN: [id number] Date of Birth: [birth year]

## 2017-11-04 NOTE — Patient Instructions (Addendum)
Medication Instructions:  Your physician has recommended you make the following change in your medication:   START: metoprolol tartrate 25 mg tablet: Take 1/2 tablet (12.5 mg total) by mouth TWICE A DAY   Labwork: None ordered  Testing/Procedures: Your physician has requested that you have an echocardiogram IN 3 MONTHS. Echocardiography is a painless test that uses sound waves to create images of your heart. It provides your doctor with information about the size and shape of your heart and how well your heart's chambers and valves are working. This procedure takes approximately one hour. There are no restrictions for this procedure.    Follow-Up: Your physician recommends that you schedule a follow-up appointment in: 3 months with Dr. Irish Lack    Any Other Special Instructions Will Be Listed Below (If Applicable).  CONTINUE TO MONITOR YOUR BLOOD PRESSURE AT HOME   Echocardiogram An echocardiogram, or echocardiography, uses sound waves (ultrasound) to produce an image of your heart. The echocardiogram is simple, painless, obtained within a short period of time, and offers valuable information to your health care provider. The images from an echocardiogram can provide information such as:  Evidence of coronary artery disease (CAD).  Heart size.  Heart muscle function.  Heart valve function.  Aneurysm detection.  Evidence of a past heart attack.  Fluid buildup around the heart.  Heart muscle thickening.  Assess heart valve function.  Tell a health care provider about:  Any allergies you have.  All medicines you are taking, including vitamins, herbs, eye drops, creams, and over-the-counter medicines.  Any problems you or family members have had with anesthetic medicines.  Any blood disorders you have.  Any surgeries you have had.  Any medical conditions you have.  Whether you are pregnant or may be pregnant. What happens before the procedure? No special  preparation is needed. Eat and drink normally. What happens during the procedure?  In order to produce an image of your heart, gel will be applied to your chest and a wand-like tool (transducer) will be moved over your chest. The gel will help transmit the sound waves from the transducer. The sound waves will harmlessly bounce off your heart to allow the heart images to be captured in real-time motion. These images will then be recorded.  You may need an IV to receive a medicine that improves the quality of the pictures. What happens after the procedure? You may return to your normal schedule including diet, activities, and medicines, unless your health care provider tells you otherwise. This information is not intended to replace advice given to you by your health care provider. Make sure you discuss any questions you have with your health care provider. Document Released: 06/07/2000 Document Revised: 01/27/2016 Document Reviewed: 02/15/2013 Elsevier Interactive Patient Education  2017 [employer].    If you need a refill on your cardiac medications before your next appointment, please call your pharmacy.

## 2017-11-05 ENCOUNTER — Ambulatory Visit: Payer: BLUE CROSS/BLUE SHIELD | Admitting: Occupational Therapy

## 2017-11-05 ENCOUNTER — Ambulatory Visit: Payer: BLUE CROSS/BLUE SHIELD

## 2017-11-05 DIAGNOSIS — I69351 Hemiplegia and hemiparesis following cerebral infarction affecting right dominant side: Secondary | ICD-10-CM

## 2017-11-05 DIAGNOSIS — I69318 Other symptoms and signs involving cognitive functions following cerebral infarction: Secondary | ICD-10-CM

## 2017-11-05 DIAGNOSIS — R471 Dysarthria and anarthria: Secondary | ICD-10-CM

## 2017-11-05 DIAGNOSIS — M25511 Pain in right shoulder: Secondary | ICD-10-CM

## 2017-11-05 DIAGNOSIS — G8929 Other chronic pain: Secondary | ICD-10-CM

## 2017-11-05 DIAGNOSIS — R4701 Aphasia: Secondary | ICD-10-CM

## 2017-11-05 DIAGNOSIS — R2689 Other abnormalities of gait and mobility: Secondary | ICD-10-CM | POA: Diagnosis not present

## 2017-11-05 NOTE — Therapy (Signed)
[facility] [address] [place], [place], [address] Phone: [phone]   Fax:  [phone]  Occupational Therapy Treatment  Patient Details  Name: Margaret Ortiz MRN: [id number] Date of Birth: 1960-08-13 Referring Provider: Howard Pouch, DO   Encounter Date: 11/05/2017  OT End of Session - 11/05/17 1223    Visit Number  2    Number of Visits  24    Date for OT Re-Evaluation  12/27/17    Authorization Type  BC/BS    OT Start Time  1015    OT Stop Time  1110    OT Time Calculation (min)  55 min    Activity Tolerance  Patient tolerated treatment well    Behavior During Therapy  [facility] for tasks assessed/performed       Past Medical History:  Diagnosis Date  . 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  . Cardiac LV ejection fraction 21-30%   . Cardioembolic stroke ([place]) 89/21/1941   Right hemiparesis  . Cardiomyopathy ([place]) 04/2017  . Dysphagia 05/19/2017   Last known diet upgraded to regular diet with thin liquids on 06/27/2017, no records available  . Graves disease   . Hyperlipidemia   . Hypertension   . Neuropathy 05/19/2017   s/p stroke  . Plaque psoriasis     Past Surgical History:  Procedure Laterality Date  . NO PAST SURGERIES      There were no vitals filed for this visit.  Subjective Assessment - 11/05/17 1024    Pertinent History  Lt MCA CVA 05/19/2017, Graves dz    Currently in Pain?  No/denies however sister reports asking for tylenol everyday for Rt shoulder but cannot rate or describe         [facility] OT Assessment - 11/05/17 0001      Vision Assessment   Ocular Range of Motion  Within Functional Limits    Tracking/Visual Pursuits  Impaired - to be further tested in functional context    Visual Fields  No apparent deficits however difficult to assess d/t cognitive deficits    Comment  pt loses stimuli  with visual tracking - unsure if due to visual inattention or decreased ability to follow directions, or decr. vision w/ tracking. Pt reports bluriness but pt also aphasic and difficulty expressing deficits. Pt appears to have some ptosis Lt eye      Sensation   Light Touch  Appears Intact Rt hand with testing    Stereognosis  -- unable to test d/t inability to use Rt hand to move object    Additional Comments  pt may still have deficits other sensory tracts but light touch appears intact with gross testing               OT Treatments/Exercises (OP) - 11/05/17 0001      ADLs   ADL Comments  Pt's family forgot to bring in splints previously issued - instructed to bring in next session to assess if fitting well or if splint needs to be fabricated      Neurological Re-education Exercises   Other Exercises 1  Pt/family able to demo ex's from HEP pt is doing for RUE (already previously issued from inpatient rehab facility) including self ROM for Rt shoulder, elbow, forearm, wrist, and fingers, and wt bearing ex's in standing, sit-stand, and over elbows. Reviewed proper technique in each self ROM ex and wt bearing ex  for proper joint alignment and best quality of movement and control for maximum benefit.     Other Exercises 2  Standing: wt bearing over RUE while disengaging LUE to place coins in cups at various positions on table to activate RUE as able w/ mod facilitation - pt requires mod simple cueing                OT Short Term Goals - 11/05/17 1224      OT SHORT TERM GOAL #1   Title  Pt/family independent with HEP for RUE    Time  4    Period  Weeks    Status  On-going Pt has HEP already previously issued (reviewed by outpatient therapist)       OT SHORT TERM GOAL #2   Title  Independent with splint wear and care for Rt hand prn    Time  4    Period  Weeks    Status  New      OT SHORT TERM GOAL #3   Title  Pt/family to verbalize understanding with A/E and task  modifications to increase indpendence with ADLS/IADLS (for bathing, tying shoes, cutting food, simple meal prep)    Time  4    Period  Weeks    Status  New      OT SHORT TERM GOAL #4   Title  Pt to perform bathing with only min assist and A/E prn while seated     Time  4    Period  Weeks    Status  New      OT SHORT TERM GOAL #5   Title  Pt to make sandwich w/ only supervision/cues prn    Time  4    Period  Weeks    Status  New      OT SHORT TERM GOAL #6   Title  Pt to demo 25 degrees shoulder flexion in prep for low level reaching with gross finger flexion    Time  4    Period  Weeks    Status  New      OT SHORT TERM GOAL #7   Title  Pt/family to verbalize understanding with strategies to increase independence and safety with medication management including memory strategies prn     Time  4    Period  Weeks    Status  New        OT Long Term Goals - 10/27/17 1357      OT LONG TERM GOAL #1   Title  Pt to perform shower transfers at mod I level w/ DME prn and no LOB    Time  8    Period  Weeks    Status  New    Target Date  12/27/17      OT LONG TERM GOAL #2   Title  Pt to perform simple meal prep at sup level using A/E and task modifications prn    Time  8    Period  Weeks    Status  New      OT LONG TERM GOAL #3   Title  Pt to perform light house management tasks at sup level including: laundry, washing dishes, cleaning, making bed    Time  8    Period  Weeks    Status  New      OT LONG TERM GOAL #4   Title  Pt to demo 25% finger extension in prep for releasing objects Rt hand  Time  8    Period  Weeks    Status  New      OT LONG TERM GOAL #5   Title  Pt to perform environmental scanning while performing simple physical task at 90% accuracy    Time  8    Period  Weeks    Status  New      Long Term Additional Goals   Additional Long Term Goals  Yes      OT LONG TERM GOAL #6   Title  Pt to use Rt hand as stabalizer 25% of the time    Time  8     Period  Weeks    Status  New            Plan - 11/05/17 1225    Clinical Impression Statement  Pt limited by severity of deficits. Pt's sister appears to have unrealistic expectations of pt's outcomes in re: to RUE.     Occupational Profile and client history currently impacting functional performance  no significant PMH but current impairments extensive    Occupational performance deficits (Please refer to evaluation for details):  ADL's;IADL's;Work;Leisure;Social Participation    Rehab Potential  Fair    Current Impairments/barriers affecting progress:  severity of deficits    OT Frequency  3x / week    OT Duration  8 weeks may reduce to 2x/wk when appropriate    OT Treatment/Interventions  Self-care/ADL training;Moist Heat;DME and/or AE instruction;Splinting;Therapeutic activities;Psychosocial skills training;Aquatic Therapy;Cognitive remediation/compensation;Therapeutic exercise;Coping strategies training;Neuromuscular education;Functional Mobility Training;Passive range of motion;Visual/perceptual remediation/compensation;Electrical Stimulation;Manual Therapy;Patient/family education    Plan  assess current splint and fabricate new resting hand splint prn, begin exploring A/E (shoe options, rocker knife, bathing, styling hair)    Consulted and Agree with Plan of Care  Patient;Family member/caregiver    Family Member Consulted  sister       Patient will benefit from skilled therapeutic intervention in order to improve the following deficits and impairments:  Decreased coordination, Decreased range of motion, Difficulty walking, Improper body mechanics, Decreased endurance, Decreased safety awareness, Impaired sensation, Improper spinal/pelvic alignment, Impaired tone, Decreased knowledge of precautions, Decreased knowledge of use of DME, Decreased balance, Impaired UE functional use, Pain, Decreased cognition, Decreased mobility, Decreased strength, Impaired perceived functional ability,  Impaired vision/preception  Visit Diagnosis: Hemiplegia and hemiparesis following cerebral infarction affecting right dominant side (HCC)  Chronic right shoulder pain  Other symptoms and signs involving cognitive functions following cerebral infarction    Problem List Patient Active Problem List   Diagnosis Date Noted  . Fall 09/17/2017  . Hemiparesis of right dominant side as late effect of cerebral infarction ([place]) 09/17/2017  . Shoulder subluxation, right, initial encounter 09/17/2017  . Cardioembolic stroke ([place]) 09/32/6712  . Weakness 09/17/2017  . Aphasia 09/17/2017  . 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 Cardiomyopathy ([place]) 09/17/2017  . H/O ischemic left MCA stroke 09/17/2017  . Cardiac LV ejection fraction 21-30%   . Graves disease   . Plaque psoriasis   . Neuropathy 05/19/2017    Carey Bullocks, OTR/L 11/05/2017, 12:29 PM  [facility] [address] [place], [place], [address] Phone: [phone]   Fax:  [phone]  Name: Margaret Ortiz MRN: [id number] Date of Birth: [birth year]

## 2017-11-05 NOTE — Therapy (Signed)
[facility] [address] [place], [place], [address] Phone: [phone]   Fax:  [phone]  Speech Language Pathology Treatment  Patient Details  Name: Margaret Ortiz MRN: [id number] Date of Birth: 10-07-1960 Referring Provider: Ma Hillock, DO   Encounter Date: 11/05/2017  End of Session - 11/05/17 1703    Visit Number  2    Number of Visits  17    Date for SLP Re-Evaluation  01/09/18    SLP Start Time  1105    SLP Stop Time   1145    SLP Time Calculation (min)  40 min    Activity Tolerance  Patient tolerated treatment well       Past Medical History:  Diagnosis Date  . 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  . Cardiac LV ejection fraction 21-30%   . Cardioembolic stroke ([place]) 69/62/9528   Right hemiparesis  . Cardiomyopathy ([place]) 04/2017  . Dysphagia 05/19/2017   Last known diet upgraded to regular diet with thin liquids on 06/27/2017, no records available  . Graves disease   . Hyperlipidemia   . Hypertension   . Neuropathy 05/19/2017   s/p stroke  . Plaque psoriasis     Past Surgical History:  Procedure Laterality Date  . NO PAST SURGERIES      There were no vitals filed for this visit.  Subjective Assessment - 11/05/17 1152    Subjective  (gibberish)    Currently in Pain?  No/denies            ADULT SLP TREATMENT - 11/05/17 1152      General Information   Behavior/Cognition  Cooperative;Alert flat affect      Treatment Provided   Treatment provided  Cognitive-Linquistic      Cognitive-Linquistic Treatment   Treatment focused on  Aphasia    Skilled Treatment  SLP engaged pt in conversation re: meaningful life items including her house in [place], and her son. Pt was able to convey a salient message with extra time and mod SLP questioning cues. With review at the end of the session (over items  SLP learned about pt) she filled in ends of phrases/sentences with correct articulation and word choice 100% of the time with phonemic cue x1.      Assessment / Recommendations / Plan   Plan  Continue with current plan of care      Progression Toward Goals   Progression toward goals  Progressing toward goals         SLP Short Term Goals - 11/05/17 1708      SLP SHORT TERM GOAL #1   Status  On-going      SLP SHORT TERM GOAL #2   Status  On-going      SLP SHORT TERM GOAL #3   Status  On-going      SLP SHORT TERM GOAL #4   Status  On-going      SLP SHORT TERM GOAL #5   Status  On-going       SLP Long Term Goals - 11/05/17 1708      SLP LONG TERM GOAL #1   Title  Pt will participate functionally in 10 minutes simple-mod complex conversation with conversational supports for aphasia over 3 sessions.    Time  8    Period  Weeks    Status  On-going      SLP LONG TERM  GOAL #2   Title  Pt will demo understanding of 10 minutes simple-mod complex conversation with compensations over 3 sessions    Time  8    Period  Weeks    Status  On-going      SLP LONG TERM GOAL #3   Title  Pt will demo error awareness by attempting correction or by nonverbal response to errors with 90% success    Time  8    Period  Weeks    Status  On-going       Plan - 11/05/17 1703    Clinical Impression Statement  Moderate-severe Wernicke's aphasia was seen today in pt's session with SLP.  Neologisms, paraphasias and occasionally intact words and phrases were seen in simple conversation however slower rate tended to abate some of these errors. Extra time was necessary along with SLP questioning cues for listener to gain understanding. SLP had to repeat some simple questions/statements as pt demonstrated decr'd comprhension of auditory message presented to her. I recommend skilled ST to address aphasia in order to improve functional communication and quality of life.     Speech Therapy Frequency  2x /  week    Duration  -- 8 weeks or 16 additional visits    Treatment/Interventions  Multimodal communcation approach;Compensatory strategies;Language facilitation;Compensatory techniques;Cueing hierarchy;Internal/external aids;Functional tasks;SLP instruction and feedback;Patient/family education    Potential to Achieve Goals  Good    Potential Considerations  Other (comment);Severity of impairments 6 months post-onset    SLP Home Exercise Plan  provided    Consulted and Agree with Plan of Care  Patient;Family member/caregiver       Patient will benefit from skilled therapeutic intervention in order to improve the following deficits and impairments:   Aphasia  Dysarthria and anarthria    Problem List Patient Active Problem List   Diagnosis Date Noted  . Fall 09/17/2017  . Hemiparesis of right dominant side as late effect of cerebral infarction ([facility]) 09/17/2017  . Shoulder subluxation, right, initial encounter 09/17/2017  . Cardioembolic stroke ([facility]) 75/03/2584  . Weakness 09/17/2017  . Aphasia 09/17/2017  . 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 Cardiomyopathy ([facility]) 09/17/2017  . H/O ischemic left MCA stroke 09/17/2017  . Cardiac LV ejection fraction 21-30%   . Graves disease   . Plaque psoriasis   . Neuropathy 05/19/2017    [facility] ,[place], CCC-SLP  11/05/2017, 5:09 PM  [facility] [address] [place], [place], [address] Phone: [phone]   Fax:  [phone]   Name: Margaret Ortiz MRN: [id number] Date of Birth: 05/19/1961

## 2017-11-05 NOTE — Patient Instructions (Signed)
[facility]

## 2017-11-06 ENCOUNTER — Encounter: Payer: Self-pay | Admitting: Speech Pathology

## 2017-11-06 ENCOUNTER — Ambulatory Visit: Payer: BLUE CROSS/BLUE SHIELD | Admitting: Speech Pathology

## 2017-11-06 DIAGNOSIS — R2689 Other abnormalities of gait and mobility: Secondary | ICD-10-CM | POA: Diagnosis not present

## 2017-11-06 DIAGNOSIS — R4701 Aphasia: Secondary | ICD-10-CM

## 2017-11-06 NOTE — Therapy (Signed)
[facility] [address] [place], [place], [address] Phone: [phone]   Fax:  [phone]  Speech Language Pathology Treatment  Patient Details  Name: Margaret Ortiz MRN: [id number] Date of Birth: [birth year] Referring Provider: Ma Hillock, DO   Encounter Date: 11/06/2017  End of Session - 11/06/17 1019    Visit Number  3    Number of Visits  17    Date for SLP Re-Evaluation  01/09/18    Authorization Type  BCBS    SLP Start Time  0930    SLP Stop Time   1017    SLP Time Calculation (min)  47 min    Activity Tolerance  Patient tolerated treatment well       Past Medical History:  Diagnosis Date  . 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  . Cardiac LV ejection fraction 21-30%   . Cardioembolic stroke ([facility]) 38/75/6433   Right hemiparesis  . Cardiomyopathy ([facility]) 04/2017  . Dysphagia 05/19/2017   Last known diet upgraded to regular diet with thin liquids on 06/27/2017, no records available  . Graves disease   . Hyperlipidemia   . Hypertension   . Neuropathy 05/19/2017   s/p stroke  . Plaque psoriasis     Past Surgical History:  Procedure Laterality Date  . NO PAST SURGERIES      There were no vitals filed for this visit.  Subjective Assessment - 11/06/17 0938    Subjective  "I got a new for heart - it started yesterday"    Currently in Pain?  No/denies            ADULT SLP TREATMENT - 11/06/17 0939      General Information   Behavior/Cognition  Cooperative;Alert      Treatment Provided   Treatment provided  Cognitive-Linquistic      Cognitive-Linquistic Treatment   Treatment focused on  Aphasia    Skilled Treatment  Facilitated auditory comprehension describing simple items for pt to identify. She required occasional repetition and reduced rate for comprehension of 3 sentence descriptions. Pt  comprehended 85% of utterances. Pt generated 1-2 sentence descriptions with spontaneous use of appropriate gestures to describe simple items with occasional min questioning cues. Written expression facilitated writing at word level names of common objects with      Assessment / Recommendations / Plan   Plan  Continue with current plan of care      Progression Toward Goals   Progression toward goals  Progressing toward goals         SLP Short Term Goals - 11/06/17 1018      SLP SHORT TERM GOAL #1   Title  Pt will complete simple naming tasks with error awareness 80% accuracy with occasional min A (compensations allowed)    Time  4    Period  Weeks    Status  New      SLP SHORT TERM GOAL #2   Title  Pt will participate functionally in 5 minutes simple conversation with conversational supports for aphasia over 3 sessions.     Time  4    Period  Weeks    Status  New      SLP SHORT TERM GOAL #3   Title  Pt will comprehend 3-step/3 component verbal directions with occasional min A 80% accuracy    Time  4    Period  Weeks  Status  New      SLP SHORT TERM GOAL #4   Title  Pt will undergo further assessment of writing, reading comprehension.    Time  4    Period  Weeks    Status  New      SLP SHORT TERM GOAL #5   Title  Pt will demo understanding of 8 minutes of simple conversation over 3 sessions with compensations for comprehension    Time  4    Period  Weeks    Status  New       SLP Long Term Goals - 11/06/17 1019      SLP LONG TERM GOAL #1   Title  Pt will participate functionally in 10 minutes simple-mod complex conversation with conversational supports for aphasia over 3 sessions.    Time  8    Period  Weeks    Status  On-going      SLP LONG TERM GOAL #2   Title  Pt will demo understanding of 10 minutes simple-mod complex conversation with compensations over 3 sessions    Time  8    Period  Weeks    Status  On-going      SLP LONG TERM GOAL #3   Title  Pt  will demo error awareness by attempting correction or by nonverbal response to errors with 90% success    Time  8    Period  Weeks    Status  On-going       Plan - 11/06/17 1018    Clinical Impression Statement  Moderate-severe Wernicke's aphasia was seen today in pt's session with SLP.  Neologisms, paraphasias and occasionally intact words and phrases were seen in simple conversation however slower rate tended to abate some of these errors. Extra time was necessary along with SLP questioning cues for listener to gain understanding. SLP had to repeat some simple questions/statements as pt demonstrated decr'd comprhension of auditory message presented to her. I recommend skilled ST to address aphasia in order to improve functional communication and quality of life.     Speech Therapy Frequency  2x / week    Treatment/Interventions  Multimodal communcation approach;Compensatory strategies;Language facilitation;Compensatory techniques;Cueing hierarchy;Internal/external aids;Functional tasks;SLP instruction and feedback;Patient/family education    Potential Considerations  Other (comment);Severity of impairments       Patient will benefit from skilled therapeutic intervention in order to improve the following deficits and impairments:   Aphasia    Problem List Patient Active Problem List   Diagnosis Date Noted  . Fall 09/17/2017  . Hemiparesis of right dominant side as late effect of cerebral infarction ([place]) 09/17/2017  . Shoulder subluxation, right, initial encounter 09/17/2017  . Cardioembolic stroke ([facility] [place]) 92/04/9416  . Weakness 09/17/2017  . Aphasia 09/17/2017  . 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 Cardiomyopathy ([facility]) 09/17/2017  . H/O ischemic left MCA stroke 09/17/2017  . Cardiac LV ejection fraction 21-30%   . Graves disease   . Plaque psoriasis   . Neuropathy 05/19/2017    Jasleen Riepe, Annye Rusk MS, CCC-SLP 11/06/2017, 10:19 AM  [facility] [address] [place], [place], [address] Phone: [phone]   Fax:  [phone]   Name: Margaret Ortiz MRN: [id number] Date of Birth: 12-03-1960

## 2017-11-10 ENCOUNTER — Ambulatory Visit: Payer: BLUE CROSS/BLUE SHIELD | Admitting: Occupational Therapy

## 2017-11-10 ENCOUNTER — Ambulatory Visit: Payer: BLUE CROSS/BLUE SHIELD | Admitting: Rehabilitation

## 2017-11-10 ENCOUNTER — Encounter: Payer: Self-pay | Admitting: Rehabilitation

## 2017-11-10 DIAGNOSIS — M6281 Muscle weakness (generalized): Secondary | ICD-10-CM

## 2017-11-10 DIAGNOSIS — R2689 Other abnormalities of gait and mobility: Secondary | ICD-10-CM | POA: Diagnosis not present

## 2017-11-10 DIAGNOSIS — I69351 Hemiplegia and hemiparesis following cerebral infarction affecting right dominant side: Secondary | ICD-10-CM

## 2017-11-10 DIAGNOSIS — R2681 Unsteadiness on feet: Secondary | ICD-10-CM

## 2017-11-10 NOTE — Therapy (Signed)
[facility] [address] [place], [place], [address] Phone: [phone]   Fax:  [phone]  Occupational Therapy Treatment  Patient Details  Name: Margaret Ortiz MRN: [id number] Date of Birth: Sep 22, 1960 Referring Provider: Howard Pouch, DO   Encounter Date: 11/10/2017  OT End of Session - 11/10/17 1216    Visit Number  3    Number of Visits  24    Date for OT Re-Evaluation  12/27/17    Authorization Type  BC/BS    OT Start Time  1020    OT Stop Time  1100    OT Time Calculation (min)  40 min    Activity Tolerance  Patient tolerated treatment well    Behavior During Therapy  [facility] for tasks assessed/performed       Past Medical History:  Diagnosis Date  . 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  . Cardiac LV ejection fraction 21-30%   . Cardioembolic stroke ([facility]) 36/64/4034   Right hemiparesis  . Cardiomyopathy ([facility]) 04/2017  . Dysphagia 05/19/2017   Last known diet upgraded to regular diet with thin liquids on 06/27/2017, no records available  . Graves disease   . Hyperlipidemia   . Hypertension   . Neuropathy 05/19/2017   s/p stroke  . Plaque psoriasis     Past Surgical History:  Procedure Laterality Date  . NO PAST SURGERIES      There were no vitals filed for this visit.  Subjective Assessment - 11/10/17 1020    Pertinent History  Lt MCA CVA 05/19/2017, Graves dz    Currently in Pain?  Yes    Pain Score  -- unable to rate d/t aphasia    Pain Location  Shoulder    Pain Orientation  Right    Pain Onset  More than a month ago    Pain Frequency  Intermittent                   OT Treatments/Exercises (OP) - 11/10/17 0001      ADLs   ADL Comments  Assessed current pre-fab splints: resting hand splint does not fit well especially at thumb. Wrist cock up is ok, but may benefit from D-ring splint for  daytime use. Pt also reports having Giv Mohr sling but did not bring with her today. Pt has compression glove but no longer needs d/t resolved edema Rt hand.       Splinting   Splinting  Fabricated and fitted resting hand splint - however did not issue secondary to no family present to educate in splint wear and care, and to time constraints               OT Short Term Goals - 11/05/17 1224      OT SHORT TERM GOAL #1   Title  Pt/family independent with HEP for RUE    Time  4    Period  Weeks    Status  On-going Pt has HEP already previously issued (reviewed by outpatient therapist)       OT SHORT TERM GOAL #2   Title  Independent with splint wear and care for Rt hand prn    Time  4    Period  Weeks    Status  New      OT SHORT TERM GOAL #3   Title  Pt/family to verbalize understanding with A/E and task modifications  to increase indpendence with ADLS/IADLS (for bathing, tying shoes, cutting food, simple meal prep)    Time  4    Period  Weeks    Status  New      OT SHORT TERM GOAL #4   Title  Pt to perform bathing with only min assist and A/E prn while seated     Time  4    Period  Weeks    Status  New      OT SHORT TERM GOAL #5   Title  Pt to make sandwich w/ only supervision/cues prn    Time  4    Period  Weeks    Status  New      OT SHORT TERM GOAL #6   Title  Pt to demo 25 degrees shoulder flexion in prep for low level reaching with gross finger flexion    Time  4    Period  Weeks    Status  New      OT SHORT TERM GOAL #7   Title  Pt/family to verbalize understanding with strategies to increase independence and safety with medication management including memory strategies prn     Time  4    Period  Weeks    Status  New        OT Long Term Goals - 10/27/17 1357      OT LONG TERM GOAL #1   Title  Pt to perform shower transfers at mod I level w/ DME prn and no LOB    Time  8    Period  Weeks    Status  New    Target Date  12/27/17      OT LONG  TERM GOAL #2   Title  Pt to perform simple meal prep at sup level using A/E and task modifications prn    Time  8    Period  Weeks    Status  New      OT LONG TERM GOAL #3   Title  Pt to perform light house management tasks at sup level including: laundry, washing dishes, cleaning, making bed    Time  8    Period  Weeks    Status  New      OT LONG TERM GOAL #4   Title  Pt to demo 25% finger extension in prep for releasing objects Rt hand     Time  8    Period  Weeks    Status  New      OT LONG TERM GOAL #5   Title  Pt to perform environmental scanning while performing simple physical task at 90% accuracy    Time  8    Period  Weeks    Status  New      Long Term Additional Goals   Additional Long Term Goals  Yes      OT LONG TERM GOAL #6   Title  Pt to use Rt hand as stabalizer 25% of the time    Time  8    Period  Weeks    Status  New            Plan - 11/10/17 1216    Clinical Impression Statement  Pt with aphasia which limits her ability to effectively communicate her concerns or questions. Pt however appears concerned and tends to perseverate on shoulder subluxation and wants O.T. to focus on this.     Occupational Profile and client history currently impacting functional  performance  no significant PMH but current impairments extensive    Occupational performance deficits (Please refer to evaluation for details):  ADL's;IADL's;Work;Leisure;Social Participation    Rehab Potential  Fair    Current Impairments/barriers affecting progress:  severity of deficits, unrealistic expectations    OT Frequency  3x / week    OT Duration  8 weeks    OT Treatment/Interventions  Self-care/ADL training;Moist Heat;DME and/or AE instruction;Splinting;Therapeutic activities;Psychosocial skills training;Aquatic Therapy;Cognitive remediation/compensation;Therapeutic exercise;Coping strategies training;Neuromuscular education;Functional Mobility Training;Passive range of  motion;Visual/perceptual remediation/compensation;Electrical Stimulation;Manual Therapy;Patient/family education    Plan  review resting hand splint wear and care and issue, begin exploring A/E prn (shoe A/E options, rocker knife, a/e for bathing and styling hair). Explained to pt that the following session would focus more on RUE NMR      Patient will benefit from skilled therapeutic intervention in order to improve the following deficits and impairments:  Decreased coordination, Decreased range of motion, Difficulty walking, Improper body mechanics, Decreased endurance, Decreased safety awareness, Impaired sensation, Improper spinal/pelvic alignment, Impaired tone, Decreased knowledge of precautions, Decreased knowledge of use of DME, Decreased balance, Impaired UE functional use, Pain, Decreased cognition, Decreased mobility, Decreased strength, Impaired perceived functional ability, Impaired vision/preception  Visit Diagnosis: Hemiplegia and hemiparesis following cerebral infarction affecting right dominant side [facility])    Problem List Patient Active Problem List   Diagnosis Date Noted  . Fall 09/17/2017  . Hemiparesis of right dominant side as late effect of cerebral infarction ([place]) 09/17/2017  . Shoulder subluxation, right, initial encounter 09/17/2017  . Cardioembolic stroke ([place]) 40/98/1191  . Weakness 09/17/2017  . Aphasia 09/17/2017  . 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 Cardiomyopathy ([place]) 09/17/2017  . H/O ischemic left MCA stroke 09/17/2017  . Cardiac LV ejection fraction 21-30%   . Graves disease   . Plaque psoriasis   . Neuropathy 05/19/2017    Carey Bullocks, OTR/L  11/10/2017, 12:20 PM  [facility] [address] [place], [place], [address] Phone: [phone]   Fax:  [phone]  Name: Margaret Ortiz MRN: [id number] Date of Birth: 08-11-1960

## 2017-11-10 NOTE — Therapy (Signed)
[facility] [address] [place], [place], [address] Phone: [phone]   Fax:  [phone]  Physical Therapy Treatment  Patient Details  Name: Margaret Ortiz MRN: [id number] Date of Birth: May 05, 1961 Referring Provider: Howard Pouch, DO   Encounter Date: 11/10/2017  PT End of Session - 11/10/17 1252    Visit Number  2    Number of Visits  17    Date for PT Re-Evaluation  12/27/17    Authorization Type  BCBS *awaiting information on plan    PT Start Time  0847    PT Stop Time  0932    PT Time Calculation (min)  45 min    Equipment Utilized During Treatment  Gait belt    Activity Tolerance  Patient tolerated treatment well    Behavior During Therapy  [facility] for tasks assessed/performed       Past Medical History:  Diagnosis Date  . 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  . Cardiac LV ejection fraction 21-30%   . Cardioembolic stroke ([place]) 76/19/5093   Right hemiparesis  . Cardiomyopathy ([place]) 04/2017  . Dysphagia 05/19/2017   Last known diet upgraded to regular diet with thin liquids on 06/27/2017, no records available  . Graves disease   . Hyperlipidemia   . Hypertension   . Neuropathy 05/19/2017   s/p stroke  . Plaque psoriasis     Past Surgical History:  Procedure Laterality Date  . NO PAST SURGERIES      There were no vitals filed for this visit.  Subjective Assessment - 11/10/17 0853    Subjective  Reports no changes since last visit, no falls.  Went to see cardiologist, she reports they did change medication. Difficult to discern which one due to aphasia.    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      Currently in Pain?  No/denies                        [facility] Adult PT Treatment/Exercise - 11/10/17 0001      Ambulation/Gait   Ambulation/Gait  Yes    Ambulation/Gait Assistance  5: Supervision;4: Min assist    Ambulation/Gait Assistance Details  Pt able to ambulate at S level with [facility] into clinic with somewhat improved hip/knee flexion, still has some increased trunk rotation which she continues to repsond to facilitation well.  Also had her ambulate some during session without AD.  She requires min A with PT providing facilitation at trunk/rib cage for more R lateral/forward weight shift during R stance phase of gait.  She takes short quick steps on LLE to avoid WB through RLE despite facilitation and max cues.     Ambulation Distance (Feet)  40 Feet x 2 reps     Assistive device  Small based quad cane;None    Gait Pattern  Decreased stance time - right;Decreased step length - left;Decreased stride length;Decreased hip/knee flexion - right;Decreased dorsiflexion - right;Decreased weight shift to right;Right circumduction;Right hip hike;Lateral hip instability;Poor foot clearance - right    Ambulation Surface  Level;Indoor    Gait Comments  Pt continues to verbalize not liking her custom AFO.  Would like to trial different braces.  Will plan to do so at  next visit.       Neuro Re-ed    Neuro Re-ed Details   Initiated HEP for RLE NMR/strengthening.  Performed supine BLE bridging, x 10 reps with cues for improved control in RLE.  Supine R hip flex into extension off of edge of mat and elevating back to mat x 10 reps with light assist intermittently to avoid overt hip abd.  Attempted sit to stand for HEP, however pt unable to perform with equal WB despite PT placing 2" step under LLE and use of mirror.  PT then continued with task for in session only with facilitation and cues to "keep hip to my hip" during transitional movements.  Performed x 10 reps in this manner, but did not add to HEP.  Standing at counter top  stepping LLE out to side and back for improved automatic R weight shift and WB x 10 reps with cues for upright posture.  See pt instruction for details on exercises performed.  Brother in law not present during session, so unable to educate on how pt may need assist/cues.               PT Education - 11/10/17 1251    Education provided  Yes    Education Details  HEP    Person(s) Educated  Patient    Methods  Explanation;Demonstration;Handout    Comprehension  Verbalized understanding;Returned demonstration;Verbal cues required;Tactile cues required;Need further instruction       PT Short Term Goals - 10/27/17 7062      PT SHORT TERM GOAL #1   Title  The patient will perform HEP with cues from family for LE strength, standing balance, and motor control.    Baseline  STG due date 11/27/2017    Time  4    Period  Weeks    Target Date  11/27/17      PT SHORT TERM GOAL #2   Title  The patient will improve gait speed from 0.85 ft/sec to > or equal to 1.4 ft/sec to demonstrate transition from "household ambulator" to "limited community ambulator" classification of gait.    Time  4    Period  Weeks    Target Date  11/27/17      PT SHORT TERM GOAL #3   Title  The patient will improve Berg balance score from 32/56 to > or equal to 38/56 to demo dec'ing risk for falls.    Time  4    Period  Weeks    Target Date  11/27/17      PT SHORT TERM GOAL #4   Title  The patient will negotiate 4 steps with one rail and step to pattern mod indep.    Time  4    Period  Weeks    Target Date  11/27/17      PT SHORT TERM GOAL #5   Title  The patient will ambulate x 500 ft nonstop mod indep for improving access to community surfaces.    Time  4    Period  Weeks    Target Date  11/27/17        PT Long Term Goals - 10/28/17 0740      PT LONG TERM GOAL #1   Title  The patient will perform HEP with intermittent assist for post d/c progression of exercise.    Time  8    Period  Weeks     Target Date  12/27/17      PT LONG  TERM GOAL #2   Title  The patient will move floor<>stand with mod indep using L UE support on surface due to h/o falls.    Time  8    Period  Weeks    Target Date  12/27/17      PT LONG TERM GOAL #3   Title  The patient will improve gait speed from 0.85 ft/sec to > or equal to 1.8 ft/sec to demo dec'ing risk for falls.    Time  8    Period  Weeks    Target Date  12/27/17      PT LONG TERM GOAL #4   Title  The patient will improve Berg score from 32/56 to > or equal to 44/56 to demo dec'ing risk for falls.    Time  8    Period  Weeks    Target Date  12/27/17      PT LONG TERM GOAL #5   Title  The patient will negotiate 12 steps without a handrail with step to pattern mod indep to access home entries.    Time  8    Period  Weeks    Target Date  12/27/17            Plan - 11/10/17 1252    Clinical Impression Statement  Skilled session focused on initiating HEP for RLE NMR/strengthening.  See pt instruction for details, however brother in law not present during session and feel she may need some assist/cueing to complete exercises appropriately.  Will continue to go over with family as able.  Also performed short distance ambulation without device for NMR on improved postural control and improved R latera/forward weight shift onto RLE during gait.  Will continue to address.      Rehab Potential  Good    Clinical Impairments Affecting Rehab Potential  Ability to follow commands hindered during evaluation, patient appears motivated to participate    PT Frequency  2x / week eval +    PT Duration  8 weeks    PT Treatment/Interventions  ADLs/Self Care Home Management;Gait training;Stair training;Functional mobility training;Therapeutic activities;Therapeutic exercise;Balance training;Neuromuscular re-education;Patient/family education;Orthotic Fit/Training;Electrical Stimulation;Manual techniques;DME Instruction    PT Next Visit Plan  Go over HEP as  needed-go over with family if able to ensure carryover, gait training (in clinic without SBQC to encourage more reciprocal pattern), set up on [employer] functional e-stim (*PT reviewing precautions for cardiomyopathy?), R side strengthening, hip initiation during gait tasks, right LE loading/weight shifting.    Consulted and Agree with Plan of Care  Family member/caregiver;Patient       Patient will benefit from skilled therapeutic intervention in order to improve the following deficits and impairments:  Abnormal gait, Impaired sensation, Pain, Postural dysfunction, Impaired tone, Decreased mobility, Decreased coordination, Decreased activity tolerance, Decreased endurance, Decreased strength, Difficulty walking, Decreased balance  Visit Diagnosis: Hemiplegia and hemiparesis following cerebral infarction affecting right dominant side (HCC)  Unsteadiness on feet  Muscle weakness (generalized)  Other abnormalities of gait and mobility     Problem List Patient Active Problem List   Diagnosis Date Noted  . Fall 09/17/2017  . 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 Shoulder subluxation, right, initial encounter 09/17/2017  . Cardioembolic stroke ([place]) 71/11/2692  . Weakness 09/17/2017  . Aphasia 09/17/2017  . 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 Cardiomyopathy ([place]) 09/17/2017  . H/O ischemic left MCA stroke 09/17/2017  . Cardiac LV ejection  fraction 21-30%   . Graves disease   . Plaque psoriasis   . Neuropathy 05/19/2017    Cameron Sprang, PT, MPT [facility] [address] [place], [place], [address] Phone: [phone]   Fax:  [phone] 11/10/17, 12:56 PM  Name: Nelly Scriven MRN: [id number] Date of Birth: 02/26/1961

## 2017-11-10 NOTE — Patient Instructions (Signed)
Bridging    Slowly raise buttocks from floor, keeping stomach tight. Repeat __10__ times per set. Do __1__ sets per session. Do __2__ sessions per day.  http://orth.exer.us/1097    Refer to paper instruction for next exercise.  The one where you are lying on your back, knees are bent and you are picking up right leg and lowering off of couch and raising back up.  Do 10 reps of that exercise. 2 times per day.   Adduction: Hip - Knees Together (Hook-Lying)    Lie with hips and knees bent, towel roll between knees. Push knees together. Hold for _5__ seconds. Rest for __2-3_ seconds. Repeat _10__ times. Do __2_ times a day.    Copyright  VHI. All rights reserved.   Hip Abduction (Standing)    Stand with support at countertop. Squeeze pelvic floor and hold. Lift left leg out to side, keeping toe forward. Hold for _2-3__ seconds. Relax for _2-3__ seconds.  Step left foot out tap to floor and back to middle.  Do this VERY slowly and make sure you stand up TALL.   Repeat _10__ times. Do __2_ times a day.    Copyright  VHI. All rights reserved.

## 2017-11-11 ENCOUNTER — Encounter: Payer: Self-pay | Admitting: Speech Pathology

## 2017-11-11 ENCOUNTER — Ambulatory Visit: Payer: BLUE CROSS/BLUE SHIELD | Admitting: Speech Pathology

## 2017-11-11 ENCOUNTER — Ambulatory Visit: Payer: BLUE CROSS/BLUE SHIELD | Admitting: Occupational Therapy

## 2017-11-11 ENCOUNTER — Ambulatory Visit: Payer: BLUE CROSS/BLUE SHIELD

## 2017-11-11 ENCOUNTER — Encounter: Payer: Self-pay | Admitting: Occupational Therapy

## 2017-11-11 DIAGNOSIS — G8929 Other chronic pain: Secondary | ICD-10-CM

## 2017-11-11 DIAGNOSIS — R41842 Visuospatial deficit: Secondary | ICD-10-CM

## 2017-11-11 DIAGNOSIS — R2681 Unsteadiness on feet: Secondary | ICD-10-CM

## 2017-11-11 DIAGNOSIS — I69318 Other symptoms and signs involving cognitive functions following cerebral infarction: Secondary | ICD-10-CM

## 2017-11-11 DIAGNOSIS — R2689 Other abnormalities of gait and mobility: Secondary | ICD-10-CM | POA: Diagnosis not present

## 2017-11-11 DIAGNOSIS — M25511 Pain in right shoulder: Secondary | ICD-10-CM

## 2017-11-11 DIAGNOSIS — R278 Other lack of coordination: Secondary | ICD-10-CM

## 2017-11-11 DIAGNOSIS — I69351 Hemiplegia and hemiparesis following cerebral infarction affecting right dominant side: Secondary | ICD-10-CM

## 2017-11-11 DIAGNOSIS — R29818 Other symptoms and signs involving the nervous system: Secondary | ICD-10-CM

## 2017-11-11 DIAGNOSIS — M6281 Muscle weakness (generalized): Secondary | ICD-10-CM

## 2017-11-11 DIAGNOSIS — R4701 Aphasia: Secondary | ICD-10-CM

## 2017-11-11 DIAGNOSIS — R208 Other disturbances of skin sensation: Secondary | ICD-10-CM

## 2017-11-11 NOTE — Patient Instructions (Signed)
Bridging    Slowly raise buttocks from floor, keeping stomach tight. Repeat __10__ times per set. Do __1__ sets per session. Do __2__ sessions per day.  http://orth.exer.us/1097    Refer to paper instruction for next exercise.  The one where you are lying on your back, knees are bent and you are picking up right leg and lowering off of couch and raising back up.  Do 10 reps of that exercise. 2 times per day.   Adduction: Hip - Knees Together (Hook-Lying)    Lie with hips and knees bent, towel roll between knees. Push knees together. Hold for _5__ seconds. Rest for __2-3_ seconds. Repeat _10__ times. Do __2_ times a day.    Copyright  VHI. All rights reserved.   Hip Abduction (Standing)    Stand with support at countertop. Squeeze pelvic floor and hold. Lift left leg out to side, keeping toe forward. Hold for _2-3__ seconds. Relax for _2-3__ seconds.  Step left foot out tap to floor and back to middle.  Do this VERY slowly and make sure you stand up TALL.   Repeat _10__ times. Do __2_ times a day.    Copyright  VHI. All rights reserved.

## 2017-11-11 NOTE — Patient Instructions (Signed)
Your Splint This splint should initially be fitted by a healthcare practitioner.  The healthcare practitioner is responsible for providing wearing instructions and precautions to the patient, other healthcare practitioners and care provider involved in the patient's care.  This splint was custom made for you. Please read the following instructions to learn about wearing and caring for your splint.  Precautions Should your splint cause any of the following problems, remove the splint immediately and contact your therapist/physician.  Swelling  Severe Pain  Pressure Areas  Stiffness  Numbness  Do not wear your splint while operating machinery unless it has been fabricated for that purpose.  When To Wear Your Splint Where your splint as follows: Day One (tomorrow):  Wear 2 hours in the morning and 2 hours in the afternoon Day Two (Thursday):  Wear 2 hours in the morning and 2 hours in the afternoon Day Three (Friday):  Wear 4 hours in the morning and 4 hours in the late afternoon Day Four (Saturday):  Wear 4 hours in the morning and 4 hours in the late afternoon Day Five (Sunday):  DO NOT WEAR DURING THE DAY. From this point forward only wear at night when you are sleeping   Care and Cleaning of Your Splint 1. Keep your splint away from open flames. 2. Your splint will lose its shape in temperatures over 135 degrees Farenheit, ( in car windows, near radiators, ovens or in hot water).  Never make any adjustments to your splint, if the splint needs adjusting remove it and make an appointment to see your therapist. 3. Your splint, including the cushion liner may be cleaned with soap and lukewarm water.  Do not immerse in hot water over 135 degrees Farenheit. 4. Straps may be washed with soap and water, but do not moisten the self-adhesive portion. 5. For ink or hard to remove spots use a scouring cleanser which contains chlorine.  Rinse the splint thoroughly after using chlorine  cleanser.

## 2017-11-11 NOTE — Therapy (Signed)
[facility] [address] [place], [place], [address] Phone: [phone]   Fax:  [phone]  Speech Language Pathology Treatment  Patient Details  Name: Margaret Ortiz MRN: [id number] Date of Birth: 06-Mar-1961 Referring Provider: Ma Hillock, DO   Encounter Date: 11/11/2017  End of Session - 11/11/17 1203    Visit Number  4    Number of Visits  17    Date for SLP Re-Evaluation  01/09/18    Authorization Type  BCBS    SLP Start Time  4166    SLP Stop Time   1144    SLP Time Calculation (min)  42 min    Activity Tolerance  Patient tolerated treatment well       Past Medical History:  Diagnosis Date  . 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  . Cardiac LV ejection fraction 21-30%   . Cardioembolic stroke ([place]) 12/22/1599   Right hemiparesis  . Cardiomyopathy ([place]) 04/2017  . Dysphagia 05/19/2017   Last known diet upgraded to regular diet with thin liquids on 06/27/2017, no records available  . Graves disease   . Hyperlipidemia   . Hypertension   . Neuropathy 05/19/2017   s/p stroke  . Plaque psoriasis     Past Surgical History:  Procedure Laterality Date  . NO PAST SURGERIES      There were no vitals filed for this visit.  Subjective Assessment - 11/11/17 1112    Subjective  "I uh did 2 strokes, uh exercises"            ADULT SLP TREATMENT - 11/11/17 1128      General Information   Behavior/Cognition  Cooperative;Alert      Treatment Provided   Treatment provided  Cognitive-Linquistic      Cognitive-Linquistic Treatment   Treatment focused on  Aphasia    Skilled Treatment  Pt named 5-6 items in simple category with occasional written and semantic cues. Auditory comprehension of 3 sentence descriptions to ID object with rare min repetition, however occasional written/1st letter cues for naming. Pt  indicates she knows what the object is, but can't think of the word. Verbal expression facilitated with pt generating 2-3 phrases about a simple object with cues to use 2-4 word phrases instead of 1 word utterances. Simple conversation required questioning cues to utilize compensations for word finding and questioning cues for clarification of message due to aphasia.       Assessment / Recommendations / Plan   Plan  Continue with current plan of care      Progression Toward Goals   Progression toward goals  Progressing toward goals         SLP Short Term Goals - 11/11/17 1202      SLP SHORT TERM GOAL #1   Title  Pt will complete simple naming tasks with error awareness 80% accuracy with occasional min A (compensations allowed)    Time  3    Period  Weeks    Status  On-going      SLP SHORT TERM GOAL #2   Title  Pt will participate functionally in 5 minutes simple conversation with conversational supports for aphasia over 3 sessions.     Time  3    Period  Weeks    Status  On-going      SLP SHORT TERM GOAL #3   Title  Pt will comprehend 3-step/3  component verbal directions with occasional min A 80% accuracy    Time  3    Period  Weeks    Status  On-going      SLP SHORT TERM GOAL #4   Title  Pt will undergo further assessment of writing, reading comprehension.    Time  4    Period  Weeks    Status  New      SLP SHORT TERM GOAL #5   Title  Pt will demo understanding of 8 minutes of simple conversation over 3 sessions with compensations for comprehension    Time  3    Period  Weeks    Status  On-going       SLP Long Term Goals - 11/11/17 1203      SLP LONG TERM GOAL #1   Title  Pt will participate functionally in 10 minutes simple-mod complex conversation with conversational supports for aphasia over 3 sessions.    Time  7    Period  Weeks    Status  On-going      SLP LONG TERM GOAL #2   Title  Pt will demo understanding of 10 minutes simple-mod complex conversation  with compensations over 3 sessions    Time  7    Period  Weeks    Status  On-going      SLP LONG TERM GOAL #3   Title  Pt will demo error awareness by attempting correction or by nonverbal response to errors with 90% success    Time  7    Period  Weeks    Status  On-going       Plan - 11/11/17 1158    Clinical Impression Statement  Pt continues to present with moderate fluent aphasia affecting comprhension and expression. Auditory comprehension of multi sentence utterances requires extended time, reduced rate and gestures for A.  Paraphasias and neologisms persist affecting simple conversation. Continue skilled ST to maximize functional communication for independence and QOL.     Speech Therapy Frequency  2x / week    Treatment/Interventions  Multimodal communcation approach;Compensatory strategies;Language facilitation;Compensatory techniques;Cueing hierarchy;Internal/external aids;Functional tasks;SLP instruction and feedback;Patient/family education       Patient will benefit from skilled therapeutic intervention in order to improve the following deficits and impairments:   Aphasia    Problem List Patient Active Problem List   Diagnosis Date Noted  . Fall 09/17/2017  . Hemiparesis of right dominant side as late effect of cerebral infarction ([place]) 09/17/2017  . Shoulder subluxation, right, initial encounter 09/17/2017  . Cardioembolic stroke ([place]) 41/66/0630  . Weakness 09/17/2017  . Aphasia 09/17/2017  . 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 Cardiomyopathy ([place]) 09/17/2017  . H/O ischemic left MCA stroke 09/17/2017  . Cardiac LV ejection fraction 21-30%   . Graves disease   . Plaque psoriasis   . Neuropathy 05/19/2017    Calani Gick, Annye Rusk MS, CCC-SLP 11/11/2017, 12:05 PM  [facility] [address] [place], [place], [address] Phone: [phone]   Fax:  [phone]   Name: Janelli Welling MRN: [id number] Date of Birth: 1961/03/06

## 2017-11-11 NOTE — Therapy (Signed)
[facility] [address] [place], [place], [address] Phone: [phone]   Fax:  [phone]  Occupational Therapy Treatment  Patient Details  Name: Margaret Ortiz MRN: [id number] Date of Birth: 24-Mar-1961 Referring Provider: Howard Pouch, DO   Encounter Date: 11/11/2017  OT End of Session - 11/11/17 1621    Visit Number  4    Number of Visits  24    Date for OT Re-Evaluation  12/27/17    Authorization Type  BC/BS    OT Start Time  1446    OT Stop Time  1533    OT Time Calculation (min)  47 min    Activity Tolerance  Patient tolerated treatment well       Past Medical History:  Diagnosis Date  . 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  . Cardiac LV ejection fraction 21-30%   . Cardioembolic stroke ([facility]) 28/36/6294   Right hemiparesis  . Cardiomyopathy ([facility]) 04/2017  . Dysphagia 05/19/2017   Last known diet upgraded to regular diet with thin liquids on 06/27/2017, no records available  . Graves disease   . Hyperlipidemia   . Hypertension   . Neuropathy 05/19/2017   s/p stroke  . Plaque psoriasis     Past Surgical History:  Procedure Laterality Date  . NO PAST SURGERIES      There were no vitals filed for this visit.  Subjective Assessment - 11/11/17 1454    Subjective   I don't like this (pointing to Give Mohr sling)    Patient is accompained by:  Family member sister    Pertinent History  Lt MCA CVA 05/19/2017, Graves dz    Currently in Pain?  No/denies                   OT Treatments/Exercises (OP) - 11/11/17 0001      ADLs   ADL Comments  Assessed and fitted current sling as sling was not supporting RUE.  Pt able to indicate that she does not like the sling. Observed pt walk with and without sling - pt should be safe to ambulate short distances without sling but discussed that she should wear it  when walking for longer distances or up on her feet for longer periods of time to help with intermittent shoulder pain and to prevent worsening pain. Pt verbalized understanding and in agreement with plan.       Neurological Re-education Exercises   Other Exercises 1  Neuro re ed with standing, stepping and functional ambulation with emphasis on increasing weight shift and active weight bearing on RLE and R trunk/R scapula and more active shoulder girdle.        Splinting   Splinting  Instructed pt and sister in donning/doffing, care and wearing schedule for new splint.  Recommended slower build up for tolerance due to sensory deficits in pt's RUE. Pt and dtr verbalized understanding and all instructions given in writing.              OT Education - 11/11/17 1619    Education provided  Yes    Education Details  splint wear and care, donning and doffing.    Person(s) Educated  Patient;Other (comment) sister   sister   Methods  Explanation;Demonstration;Handout    Comprehension  Verbalized understanding;Returned demonstration       OT Short Term Goals - 11/11/17 1620  OT SHORT TERM GOAL #1   Title  Pt/family independent with HEP for RUE    Time  4    Period  Weeks    Status  On-going Pt has HEP already previously issued (reviewed by outpatient therapist)       OT SHORT TERM GOAL #2   Title  Independent with splint wear and care for Rt hand prn    Time  4    Period  Weeks    Status  On-going      OT SHORT TERM GOAL #3   Title  Pt/family to verbalize understanding with A/E and task modifications to increase indpendence with ADLS/IADLS (for bathing, tying shoes, cutting food, simple meal prep)    Time  4    Period  Weeks    Status  On-going      OT SHORT TERM GOAL #4   Title  Pt to perform bathing with only min assist and A/E prn while seated     Time  4    Period  Weeks    Status  On-going      OT SHORT TERM GOAL #5   Title  Pt to make sandwich w/ only  supervision/cues prn    Time  4    Period  Weeks    Status  On-going      OT SHORT TERM GOAL #6   Title  Pt to demo 25 degrees shoulder flexion in prep for low level reaching with gross finger flexion    Time  4    Period  Weeks    Status  On-going      OT SHORT TERM GOAL #7   Title  Pt/family to verbalize understanding with strategies to increase independence and safety with medication management including memory strategies prn     Time  4    Period  Weeks    Status  On-going        OT Long Term Goals - 11/11/17 1620      OT LONG TERM GOAL #1   Title  Pt to perform shower transfers at mod I level w/ DME prn and no LOB    Time  8    Period  Weeks    Status  New      OT LONG TERM GOAL #2   Title  Pt to perform simple meal prep at sup level using A/E and task modifications prn    Time  8    Period  Weeks    Status  New      OT LONG TERM GOAL #3   Title  Pt to perform light house management tasks at sup level including: laundry, washing dishes, cleaning, making bed    Time  8    Period  Weeks    Status  New      OT LONG TERM GOAL #4   Title  Pt to demo 25% finger extension in prep for releasing objects Rt hand     Time  8    Period  Weeks    Status  New      OT LONG TERM GOAL #5   Title  Pt to perform environmental scanning while performing simple physical task at 90% accuracy    Time  8    Period  Weeks    Status  New      OT LONG TERM GOAL #6   Title  Pt to use Rt hand as stabalizer 25% of the  time    Time  8    Period  Weeks    Status  New            Plan - 11/11/17 1620    Clinical Impression Statement  Pt progressing toward goals. Pt responded well to facilitation today    Occupational Profile and client history currently impacting functional performance  no significant PMH but current impairments extensive    Occupational performance deficits (Please refer to evaluation for details):  ADL's;IADL's;Work;Leisure;Social Participation    Rehab  Potential  Fair    Current Impairments/barriers affecting progress:  severity of deficits, unrealistic expectations    OT Frequency  3x / week    OT Duration  8 weeks    OT Treatment/Interventions  Self-care/ADL training;Moist Heat;DME and/or AE instruction;Splinting;Therapeutic activities;Psychosocial skills training;Aquatic Therapy;Cognitive remediation/compensation;Therapeutic exercise;Coping strategies training;Neuromuscular education;Functional Mobility Training;Passive range of motion;Visual/perceptual remediation/compensation;Electrical Stimulation;Manual Therapy;Patient/family education    Plan  Check splint tolerance, NMR for RUE/trunk/balance to improve proximal control, begin exploring A/E prn (shoe A/E options, rocker knife, a/e for bathing and styling hair)    Consulted and Agree with Plan of Care  Patient;Family member/caregiver    Family Member Consulted  sister       Patient will benefit from skilled therapeutic intervention in order to improve the following deficits and impairments:  Decreased coordination, Decreased range of motion, Difficulty walking, Improper body mechanics, Decreased endurance, Decreased safety awareness, Impaired sensation, Improper spinal/pelvic alignment, Impaired tone, Decreased knowledge of precautions, Decreased knowledge of use of DME, Decreased balance, Impaired UE functional use, Pain, Decreased cognition, Decreased mobility, Decreased strength, Impaired perceived functional ability, Impaired vision/preception  Visit Diagnosis: Hemiplegia and hemiparesis following cerebral infarction affecting right dominant side (HCC)  Unsteadiness on feet  Muscle weakness (generalized)  Chronic right shoulder pain  Other symptoms and signs involving cognitive functions following cerebral infarction  Visuospatial deficit  Other disturbances of skin sensation  Other lack of coordination  Other symptoms and signs involving the nervous system    Problem  List Patient Active Problem List   Diagnosis Date Noted  . Fall 09/17/2017  . Hemiparesis of right dominant side as late effect of cerebral infarction ([facility]) 09/17/2017  . Shoulder subluxation, right, initial encounter 09/17/2017  . Cardioembolic stroke ([facility]) 38/46/6599  . Weakness 09/17/2017  . Aphasia 09/17/2017  . 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 Cardiomyopathy ([facility]) 09/17/2017  . H/O ischemic left MCA stroke 09/17/2017  . Cardiac LV ejection fraction 21-30%   . Graves disease   . Plaque psoriasis   . Neuropathy 05/19/2017    Quay Burow, OTR/L 11/11/2017, 4:23 PM  [facility] [address] [place], [place], [address] Phone: [phone]   Fax:  [phone]  Name: Margaret Ortiz MRN: [id number] Date of Birth: 12-29-1960

## 2017-11-11 NOTE — Therapy (Signed)
[facility] [address] [place], [place], [address] Phone: [phone]   Fax:  [phone]  Physical Therapy Treatment  Patient Details  Name: Margaret Ortiz MRN: [id number] Date of Birth: 01/30/1961 Referring Provider: Howard Pouch, DO   Encounter Date: 11/11/2017  PT End of Session - 11/11/17 1551    Visit Number  3    Number of Visits  17    Date for PT Re-Evaluation  12/27/17    Authorization Type  BCBS *awaiting information on plan    PT Start Time  1404    PT Stop Time  1445    PT Time Calculation (min)  41 min    Equipment Utilized During Treatment  -- S prn    Activity Tolerance  Patient tolerated treatment well    Behavior During Therapy  [facility] for tasks assessed/performed       Past Medical History:  Diagnosis Date  . 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  . Cardiac LV ejection fraction 21-30%   . Cardioembolic stroke ([facility]) 14/48/1856   Right hemiparesis  . Cardiomyopathy ([facility]) 04/2017  . Dysphagia 05/19/2017   Last known diet upgraded to regular diet with thin liquids on 06/27/2017, no records available  . Graves disease   . Hyperlipidemia   . Hypertension   . Neuropathy 05/19/2017   s/p stroke  . Plaque psoriasis     Past Surgical History:  Procedure Laterality Date  . NO PAST SURGERIES      There were no vitals filed for this visit.  Subjective Assessment - 11/11/17 1409    Subjective  Pt denied changes or falls since last visit.     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      Currently in Pain?  No/denies         Neuro re-ed: Pt reviewed HEP and required cues for technique, along  with demo. Please see pt instructions for HEP details. Pt also perform bridges with BLEs on physioball with cues to engage abdominals 3x10reps, supine hip abd with RLE only with pt attempting 10 sec. Hold in neutral hip position for stabilization. 3x10 reps. Pt attempted R clamshells but was unable to perform 2/2 weakness. Clamshells attempted 2/2 pt had difficulty performing standing hip abd. At counter.  Incr. Time required 2/2 aphasia.                       PT Education - 11/11/17 1550    Education provided  Yes    Education Details  PT reviewed HEP with pt and her sister with cues for technique. PT also trialed bridges with physioball, as bridges were "easy" per pt. However, pt would need assist from family to perform at home.     Person(s) Educated  Patient;Other (comment) sister    Methods  Explanation;Demonstration;Handout    Comprehension  Verbalized understanding;Returned demonstration;Verbal cues required;Tactile cues required       PT Short Term Goals - 10/27/17 3149      PT SHORT TERM GOAL #1   Title  The patient will perform HEP with cues from family for LE strength, standing balance, and motor control.  Baseline  STG due date 11/27/2017    Time  4    Period  Weeks    Target Date  11/27/17      PT SHORT TERM GOAL #2   Title  The patient will improve gait speed from 0.85 ft/sec to > or equal to 1.4 ft/sec to demonstrate transition from "household ambulator" to "limited community ambulator" classification of gait.    Time  4    Period  Weeks    Target Date  11/27/17      PT SHORT TERM GOAL #3   Title  The patient will improve Berg balance score from 32/56 to > or equal to 38/56 to demo dec'ing risk for falls.    Time  4    Period  Weeks    Target Date  11/27/17      PT SHORT TERM GOAL #4   Title  The patient will negotiate 4 steps with one rail and step to pattern mod indep.    Time  4    Period  Weeks    Target Date  11/27/17      PT SHORT TERM  GOAL #5   Title  The patient will ambulate x 500 ft nonstop mod indep for improving access to community surfaces.    Time  4    Period  Weeks    Target Date  11/27/17        PT Long Term Goals - 10/28/17 0740      PT LONG TERM GOAL #1   Title  The patient will perform HEP with intermittent assist for post d/c progression of exercise.    Time  8    Period  Weeks    Target Date  12/27/17      PT LONG TERM GOAL #2   Title  The patient will move floor<>stand with mod indep using L UE support on surface due to h/o falls.    Time  8    Period  Weeks    Target Date  12/27/17      PT LONG TERM GOAL #3   Title  The patient will improve gait speed from 0.85 ft/sec to > or equal to 1.8 ft/sec to demo dec'ing risk for falls.    Time  8    Period  Weeks    Target Date  12/27/17      PT LONG TERM GOAL #4   Title  The patient will improve Berg score from 32/56 to > or equal to 44/56 to demo dec'ing risk for falls.    Time  8    Period  Weeks    Target Date  12/27/17      PT LONG TERM GOAL #5   Title  The patient will negotiate 12 steps without a handrail with step to pattern mod indep to access home entries.    Time  8    Period  Weeks    Target Date  12/27/17            Plan - 11/11/17 1552    Clinical Impression Statement  Skilled session focused on reviewing HEP with pt and family to ensure proper technique and carryover at home. Pt requires tactile and verbal cues, along with demo for proper technique. Pt would continue to benefit from skilled PT to improve safety during functional mobility.     Rehab Potential  Good    Clinical Impairments Affecting Rehab Potential  Ability to follow commands hindered during  evaluation, patient appears motivated to participate    PT Frequency  2x / week eval +    PT Duration  8 weeks    PT Treatment/Interventions  ADLs/Self Care Home Management;Gait training;Stair training;Functional mobility training;Therapeutic activities;Therapeutic  exercise;Balance training;Neuromuscular re-education;Patient/family education;Orthotic Fit/Training;Electrical Stimulation;Manual techniques;DME Instruction    PT Next Visit Plan   gait training (in clinic without SBQC to encourage more reciprocal pattern), set up on Bioness functional e-stim (*PT reviewing precautions for cardiomyopathy?), R side strengthening, hip initiation during gait tasks, right LE loading/weight shifting.    Consulted and Agree with Plan of Care  Family member/caregiver;Patient       Patient will benefit from skilled therapeutic intervention in order to improve the following deficits and impairments:  Abnormal gait, Impaired sensation, Pain, Postural dysfunction, Impaired tone, Decreased mobility, Decreased coordination, Decreased activity tolerance, Decreased endurance, Decreased strength, Difficulty walking, Decreased balance  Visit Diagnosis: Hemiplegia and hemiparesis following cerebral infarction affecting right dominant side (HCC)  Unsteadiness on feet  Other abnormalities of gait and mobility     Problem List Patient Active Problem List   Diagnosis Date Noted  . Fall 09/17/2017  . Hemiparesis of right dominant side as late effect of cerebral infarction ([facility]) 09/17/2017  . Shoulder subluxation, right, initial encounter 09/17/2017  . Cardioembolic stroke ([facility]) 96/78/9381  . Weakness 09/17/2017  . Aphasia 09/17/2017  . 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 Cardiomyopathy ([facility]) 09/17/2017  . H/O ischemic left MCA stroke 09/17/2017  . Cardiac LV ejection fraction 21-30%   . Graves disease   . Plaque psoriasis   . Neuropathy 05/19/2017    Emilliano Dilworth L 11/11/2017, 3:54 PM  [facility] [address] [place], [place], [address] Phone: [phone]   Fax:  [phone]  Name: Margaret Ortiz MRN: [id number] Date of Birth: 1961-04-05  Geoffry Paradise, PT,DPT 11/11/17  3:57 PM Phone: [phone] Fax: [phone]

## 2017-11-13 ENCOUNTER — Ambulatory Visit: Payer: BLUE CROSS/BLUE SHIELD | Admitting: Speech Pathology

## 2017-11-13 ENCOUNTER — Encounter: Payer: Self-pay | Admitting: Speech Pathology

## 2017-11-13 DIAGNOSIS — R2689 Other abnormalities of gait and mobility: Secondary | ICD-10-CM | POA: Diagnosis not present

## 2017-11-13 DIAGNOSIS — R4701 Aphasia: Secondary | ICD-10-CM

## 2017-11-13 NOTE — Therapy (Signed)
[facility] [address] [place], [place], [address] Phone: [phone]   Fax:  [phone]  Speech Language Pathology Treatment  Patient Details  Name: Margaret Ortiz MRN: [id number] Date of Birth: 07/21/1960 Referring Provider: Ma Hillock, DO   Encounter Date: 11/13/2017  End of Session - 11/13/17 1153    Visit Number  5       Past Medical History:  Diagnosis Date  . 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  . Cardiac LV ejection fraction 21-30%   . Cardioembolic stroke ([facility]) 41/66/0630   Right hemiparesis  . Cardiomyopathy ([facility]) 04/2017  . Dysphagia 05/19/2017   Last known diet upgraded to regular diet with thin liquids on 06/27/2017, no records available  . Graves disease   . Hyperlipidemia   . Hypertension   . Neuropathy 05/19/2017   s/p stroke  . Plaque psoriasis     Past Surgical History:  Procedure Laterality Date  . NO PAST SURGERIES      There were no vitals filed for this visit.  Subjective Assessment - 11/13/17 1107    Subjective  "I wear it for 4 hour" re: sling/splint    Currently in Pain?  No/denies            ADULT SLP TREATMENT - 11/13/17 1107      General Information   Behavior/Cognition  Cooperative;Alert      Treatment Provided   Treatment provided  Cognitive-Linquistic      Cognitive-Linquistic Treatment   Treatment focused on  Aphasia    Skilled Treatment  Facilitated naming in simple category pt named 3-4 independently, 6-7 with frequent mod written, semantic and phonemic cues. Verbal expression facilitated generating similarities and differences with usual mod questioning cues, written cues. Auditory comprehension of conversation with min request for repetition.       Assessment / Recommendations / Plan   Plan  Continue with current plan of care      Progression Toward Goals    Progression toward goals  Progressing toward goals         SLP Short Term Goals - 11/13/17 1153      SLP SHORT TERM GOAL #1   Title  Pt will complete simple naming tasks with error awareness 80% accuracy with occasional min A (compensations allowed)    Time  3    Period  Weeks    Status  On-going      SLP SHORT TERM GOAL #2   Title  Pt will participate functionally in 5 minutes simple conversation with conversational supports for aphasia over 3 sessions.     Time  3    Period  Weeks    Status  On-going      SLP SHORT TERM GOAL #3   Title  Pt will comprehend 3-step/3 component verbal directions with occasional min A 80% accuracy    Time  3    Period  Weeks    Status  On-going      SLP SHORT TERM GOAL #4   Title  Pt will undergo further assessment of writing, reading comprehension.    Time  4    Period  Weeks    Status  New      SLP SHORT TERM GOAL #5   Title  Pt will demo understanding of 8 minutes of simple conversation over 3 sessions with compensations for comprehension    Time  3    Period  Weeks    Status  On-going       SLP Long Term Goals - 11/13/17 1153      SLP LONG TERM GOAL #1   Title  Pt will participate functionally in 10 minutes simple-mod complex conversation with conversational supports for aphasia over 3 sessions.    Time  7    Period  Weeks    Status  On-going      SLP LONG TERM GOAL #2   Title  Pt will demo understanding of 10 minutes simple-mod complex conversation with compensations over 3 sessions    Time  7    Period  Weeks    Status  On-going      SLP LONG TERM GOAL #3   Title  Pt will demo error awareness by attempting correction or by nonverbal response to errors with 90% success    Time  7    Period  Weeks    Status  On-going       Plan - 11/13/17 1152    Clinical Impression Statement  Pt continues to present with moderate fluent aphasia affecting comprhension and expression. Auditory comprehension of multi sentence  utterances requires extended time, reduced rate and gestures for A.  Paraphasias and neologisms persist affecting simple conversation. Continue skilled ST to maximize functional communication for independence and QOL.     Speech Therapy Frequency  2x / week    Treatment/Interventions  Multimodal communcation approach;Compensatory strategies;Language facilitation;Compensatory techniques;Cueing hierarchy;Internal/external aids;Functional tasks;SLP instruction and feedback;Patient/family education    Potential to Achieve Goals  Good    Potential Considerations  Severity of impairments       Patient will benefit from skilled therapeutic intervention in order to improve the following deficits and impairments:   Aphasia    Problem List Patient Active Problem List   Diagnosis Date Noted  . Fall 09/17/2017  . Hemiparesis of right dominant side as late effect of cerebral infarction ([facility]) 09/17/2017  . Shoulder subluxation, right, initial encounter 09/17/2017  . Cardioembolic stroke ([facility]) 30/86/5784  . Weakness 09/17/2017  . Aphasia 09/17/2017  . 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 Cardiomyopathy ([facility]) 09/17/2017  . H/O ischemic left MCA stroke 09/17/2017  . Cardiac LV ejection fraction 21-30%   . Graves disease   . Plaque psoriasis   . Neuropathy 05/19/2017    Lovvorn, Annye Rusk  MS, CCC-SLP 11/13/2017, 11:54 AM  [facility] [address] [place], [place], [address] Phone: [phone]   Fax:  [phone]   Name: Margaret Ortiz MRN: [id number] Date of Birth: 07/28/1960

## 2017-11-14 ENCOUNTER — Telehealth: Payer: Self-pay | Admitting: Rehabilitative and Restorative Service Providers"

## 2017-11-14 NOTE — Telephone Encounter (Signed)
Note opened in error.

## 2017-11-18 ENCOUNTER — Ambulatory Visit: Payer: BLUE CROSS/BLUE SHIELD

## 2017-11-18 ENCOUNTER — Ambulatory Visit: Payer: BLUE CROSS/BLUE SHIELD | Admitting: Occupational Therapy

## 2017-11-18 DIAGNOSIS — I69351 Hemiplegia and hemiparesis following cerebral infarction affecting right dominant side: Secondary | ICD-10-CM

## 2017-11-18 DIAGNOSIS — R208 Other disturbances of skin sensation: Secondary | ICD-10-CM

## 2017-11-18 DIAGNOSIS — I69318 Other symptoms and signs involving cognitive functions following cerebral infarction: Secondary | ICD-10-CM

## 2017-11-18 DIAGNOSIS — R29818 Other symptoms and signs involving the nervous system: Secondary | ICD-10-CM

## 2017-11-18 DIAGNOSIS — R2689 Other abnormalities of gait and mobility: Secondary | ICD-10-CM | POA: Diagnosis not present

## 2017-11-18 DIAGNOSIS — R2681 Unsteadiness on feet: Secondary | ICD-10-CM

## 2017-11-18 NOTE — Therapy (Signed)
[facility] [address] [place], [place], [address] Phone: [phone]   Fax:  [phone]  Physical Therapy Treatment  Patient Details  Name: Margaret Ortiz MRN: [id number] Date of Birth: 1961-02-20 Referring Provider: Howard Pouch, DO   Encounter Date: 11/18/2017  PT End of Session - 11/18/17 1046    Visit Number  4    Number of Visits  17    Authorization Type  BCBS *awaiting information on plan    PT Start Time  0848    PT Stop Time  0928    PT Time Calculation (min)  40 min    Equipment Utilized During Treatment  -- min guard to S    Activity Tolerance  Patient tolerated treatment well    Behavior During Therapy  [facility] for tasks assessed/performed       Past Medical History:  Diagnosis Date  . 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  . Cardiac LV ejection fraction 21-30%   . Cardioembolic stroke ([place]) 26/37/8588   Right hemiparesis  . Cardiomyopathy ([place]) 04/2017  . Dysphagia 05/19/2017   Last known diet upgraded to regular diet with thin liquids on 06/27/2017, no records available  . Graves disease   . Hyperlipidemia   . Hypertension   . Neuropathy 05/19/2017   s/p stroke  . Plaque psoriasis     Past Surgical History:  Procedure Laterality Date  . NO PAST SURGERIES      There were no vitals filed for this visit.  Subjective Assessment - 11/18/17 0853    Subjective  Pt denied changes or falls since last visit.     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      Currently in Pain?  No/denies                       [facility] Adult PT Treatment/Exercise - 11/18/17 0853      Ambulation/Gait   Ambulation/Gait  Yes    Ambulation/Gait Assistance  5: Supervision;4: Min guard    Ambulation/Gait Assistance Details  Pt trialed amb. with various AFOs today, as her AFO is heavy.  R [place] PLS-pt experienced R foot coming out of shoe 2/2 elastic shoe laces and incr. R genu recurvatum. pt also amb. without AFO and with her R plastic AFO, pt also trialed R anterior [place] with heel wedge to decr. R genu recurvatum. Gait deviations Incr. issues without AFO and pt experienced incr. genu recurvatum and toe clearance noted without AFO donned.     Ambulation Distance (Feet)  40 Feet x4 and 115'     Assistive device  Small based quad cane    Gait Pattern  Decreased stance time - right;Decreased step length - left;Decreased stride length;Decreased hip/knee flexion - right;Decreased dorsiflexion - right;Decreased weight shift to right;Right circumduction;Right hip hike;Lateral hip instability;Poor foot clearance - right    Ambulation Surface  Level;Indoor    Gait Comments  Pt c/o RLE fatigue after amb. with all AFOs. Pt noted to experience R hip hike with her R custom AFO donned.              PT Education - 11/18/17 1045  Education provided  Yes    Education Details  PT educated pt that next time, PT will trial Bioness. PT requested pt wear shorts next session. PT also educated pt on potentially getting velcro shoe vs. elastic shoe strings, as they stretch and the lighter AFOs come out of shoe.     Person(s) Educated  Patient;Other (comment) Steve: brother in law?    Methods  Explanation    Comprehension  Verbalized understanding       PT Short Term Goals - 10/27/17 4742      PT SHORT TERM GOAL #1   Title  The patient will perform HEP with cues from family for LE strength, standing balance, and motor control.    Baseline  STG due date 11/27/2017    Time  4    Period  Weeks    Target Date  11/27/17      PT SHORT TERM GOAL #2   Title  The patient will improve gait speed from 0.85 ft/sec  to > or equal to 1.4 ft/sec to demonstrate transition from "household ambulator" to "limited community ambulator" classification of gait.    Time  4    Period  Weeks    Target Date  11/27/17      PT SHORT TERM GOAL #3   Title  The patient will improve Berg balance score from 32/56 to > or equal to 38/56 to demo dec'ing risk for falls.    Time  4    Period  Weeks    Target Date  11/27/17      PT SHORT TERM GOAL #4   Title  The patient will negotiate 4 steps with one rail and step to pattern mod indep.    Time  4    Period  Weeks    Target Date  11/27/17      PT SHORT TERM GOAL #5   Title  The patient will ambulate x 500 ft nonstop mod indep for improving access to community surfaces.    Time  4    Period  Weeks    Target Date  11/27/17        PT Long Term Goals - 10/28/17 0740      PT LONG TERM GOAL #1   Title  The patient will perform HEP with intermittent assist for post d/c progression of exercise.    Time  8    Period  Weeks    Target Date  12/27/17      PT LONG TERM GOAL #2   Title  The patient will move floor<>stand with mod indep using L UE support on surface due to h/o falls.    Time  8    Period  Weeks    Target Date  12/27/17      PT LONG TERM GOAL #3   Title  The patient will improve gait speed from 0.85 ft/sec to > or equal to 1.8 ft/sec to demo dec'ing risk for falls.    Time  8    Period  Weeks    Target Date  12/27/17      PT LONG TERM GOAL #4   Title  The patient will improve Berg score from 32/56 to > or equal to 44/56 to demo dec'ing risk for falls.    Time  8    Period  Weeks    Target Date  12/27/17      PT LONG TERM GOAL #5   Title  The patient will  negotiate 12 steps without a handrail with step to pattern mod indep to access home entries.    Time  8    Period  Weeks    Target Date  12/27/17            Plan - 11/18/17 1046    Clinical Impression Statement  Pt would benefit from further ambulation with posterior (lightweight) AFOs  donned with velcro shoes vs. elastic shoes, as it is difficult to determine which brace is most effective based on foot rising out of shoe. Pt continues to experience hip hike, decr. R knee flexion, even with her custom AFO donned. PT will trial Bioness next session. Continue with POC.     Rehab Potential  Good    Clinical Impairments Affecting Rehab Potential  Ability to follow commands hindered during evaluation, patient appears motivated to participate    PT Frequency  2x / week eval +    PT Duration  8 weeks    PT Treatment/Interventions  ADLs/Self Care Home Management;Gait training;Stair training;Functional mobility training;Therapeutic activities;Therapeutic exercise;Balance training;Neuromuscular re-education;Patient/family education;Orthotic Fit/Training;Electrical Stimulation;Manual techniques;DME Instruction    PT Next Visit Plan  set up on Bioness functional e-stim (*PT reviewing precautions for cardiomyopathy?), R side strengthening, hip initiation during gait tasks, right LE loading/weight shifting.  gait training (in clinic without SBQC to encourage more reciprocal pattern),     Consulted and Agree with Plan of Care  Family member/caregiver;Patient       Patient will benefit from skilled therapeutic intervention in order to improve the following deficits and impairments:  Abnormal gait, Impaired sensation, Pain, Postural dysfunction, Impaired tone, Decreased mobility, Decreased coordination, Decreased activity tolerance, Decreased endurance, Decreased strength, Difficulty walking, Decreased balance  Visit Diagnosis: Hemiplegia and hemiparesis following cerebral infarction affecting right dominant side (HCC)  Unsteadiness on feet     Problem List Patient Active Problem List   Diagnosis Date Noted  . Fall 09/17/2017  . Hemiparesis of right dominant side as late effect of cerebral infarction ([place]) 09/17/2017  . Shoulder subluxation, right, initial encounter 09/17/2017  .  Cardioembolic stroke ([place]) 93/81/8299  . Weakness 09/17/2017  . Aphasia 09/17/2017  . 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 Cardiomyopathy ([place]) 09/17/2017  . H/O ischemic left MCA stroke 09/17/2017  . Cardiac LV ejection fraction 21-30%   . Graves disease   . Plaque psoriasis   . Neuropathy 05/19/2017    Genee Rann L 11/18/2017, 10:48 AM  [facility] [address] [place], [place], [address] Phone: [phone]   Fax:  [phone]  Name: Margaret Ortiz MRN: [id number] Date of Birth: [birth year]  Geoffry Paradise, PT,DPT 11/18/17 10:48 AM Phone: [phone] Fax: [phone]

## 2017-11-18 NOTE — Therapy (Signed)
[facility] [address] [place] [address], [place], [address] Phone: [phone]   Fax:  [phone]  Occupational Therapy Treatment  Patient Details  Name: Margaret Ortiz MRN: [id number] Date of Birth: 12-28-1960 Referring Provider: Howard Pouch, DO   Encounter Date: 11/18/2017  OT End of Session - 11/18/17 1036    Visit Number  5    Number of Visits  24    Date for OT Re-Evaluation  12/27/17    Authorization Type  BC/BS    OT Start Time  0930    OT Stop Time  1015    OT Time Calculation (min)  45 min    Activity Tolerance  Patient tolerated treatment well    Behavior During Therapy  [facility] for tasks assessed/performed       Past Medical History:  Diagnosis Date  . 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  . Cardiac LV ejection fraction 21-30%   . Cardioembolic stroke ([place]) 54/62/7035   Right hemiparesis  . Cardiomyopathy ([place]) 04/2017  . Dysphagia 05/19/2017   Last known diet upgraded to regular diet with thin liquids on 06/27/2017, no records available  . Graves disease   . Hyperlipidemia   . Hypertension   . Neuropathy 05/19/2017   s/p stroke  . Plaque psoriasis     Past Surgical History:  Procedure Laterality Date  . NO PAST SURGERIES      There were no vitals filed for this visit.  Subjective Assessment - 11/18/17 0933    Subjective   Pt reports splint is doing ok    Pertinent History  Lt MCA CVA 05/19/2017, Graves dz    Currently in Pain?  No/denies                   OT Treatments/Exercises (OP) - 11/18/17 0001      ADLs   ADL Comments  Pt/family educated in recommended A/E to increase ease and independence with BADLS including options for tying shoes (pt/brother-n-law shown how to use shoe buttons), rocker knife for cutting food, LH sponge for bathing, and hair dryer stand for blowing drying hair.  Pt/family also shown how to bathe Lt arm, and suggestions for styling hair using one handed clips      Neurological Re-education Exercises   Other Exercises 1  Closed chain low range AA/ROM RUE against therapist hand, followed by using tilted stool all with max facilitation. Pt has trace activation RUE    Other Weight-Bearing Exercises 1  BUE wt bearing for sit to low level squatting position followed by holding position w/ small wt shifts to Rt side.     Other Weight-Bearing Exercises 2  Wt bearing over Rt elbow with mod to max assist and support Rt shoulder, however, pt using trunk to push back to center (vs. RUE)               OT Short Term Goals - 11/11/17 1620      OT SHORT TERM GOAL #1   Title  Pt/family independent with HEP for RUE    Time  4    Period  Weeks    Status  On-going Pt has HEP already previously issued (reviewed by outpatient therapist)       OT SHORT TERM GOAL #2   Title  Independent with splint wear and care for Rt hand prn    Time  4  Period  Weeks    Status  On-going      OT SHORT TERM GOAL #3   Title  Pt/family to verbalize understanding with A/E and task modifications to increase indpendence with ADLS/IADLS (for bathing, tying shoes, cutting food, simple meal prep)    Time  4    Period  Weeks    Status  On-going      OT SHORT TERM GOAL #4   Title  Pt to perform bathing with only min assist and A/E prn while seated     Time  4    Period  Weeks    Status  On-going      OT SHORT TERM GOAL #5   Title  Pt to make sandwich w/ only supervision/cues prn    Time  4    Period  Weeks    Status  On-going      OT SHORT TERM GOAL #6   Title  Pt to demo 25 degrees shoulder flexion in prep for low level reaching with gross finger flexion    Time  4    Period  Weeks    Status  On-going      OT SHORT TERM GOAL #7   Title  Pt/family to verbalize understanding with strategies to increase independence and safety with medication management including  memory strategies prn     Time  4    Period  Weeks    Status  On-going        OT Long Term Goals - 11/11/17 1620      OT LONG TERM GOAL #1   Title  Pt to perform shower transfers at mod I level w/ DME prn and no LOB    Time  8    Period  Weeks    Status  New      OT LONG TERM GOAL #2   Title  Pt to perform simple meal prep at sup level using A/E and task modifications prn    Time  8    Period  Weeks    Status  New      OT LONG TERM GOAL #3   Title  Pt to perform light house management tasks at sup level including: laundry, washing dishes, cleaning, making bed    Time  8    Period  Weeks    Status  New      OT LONG TERM GOAL #4   Title  Pt to demo 25% finger extension in prep for releasing objects Rt hand     Time  8    Period  Weeks    Status  New      OT LONG TERM GOAL #5   Title  Pt to perform environmental scanning while performing simple physical task at 90% accuracy    Time  8    Period  Weeks    Status  New      OT LONG TERM GOAL #6   Title  Pt to use Rt hand as stabalizer 25% of the time    Time  8    Period  Weeks    Status  New            Plan - 11/18/17 1037    Clinical Impression Statement  Pt slowly progressing toward STG's. Pt responded well to facilitation today    Occupational Profile and client history currently impacting functional performance  no significant PMH but current impairments extensive    Occupational performance  deficits (Please refer to evaluation for details):  ADL's;IADL's;Work;Leisure;Social Participation    Rehab Potential  Fair    Current Impairments/barriers affecting progress:  severity of deficits, unrealistic expectations    OT Frequency  3x / week    OT Duration  8 weeks    OT Treatment/Interventions  Self-care/ADL training;Moist Heat;DME and/or AE instruction;Splinting;Therapeutic activities;Psychosocial skills training;Aquatic Therapy;Cognitive remediation/compensation;Therapeutic exercise;Coping strategies  training;Neuromuscular education;Functional Mobility Training;Passive range of motion;Visual/perceptual remediation/compensation;Electrical Stimulation;Manual Therapy;Patient/family education    Plan  continue NMR, try estim for wrist/fingers    Consulted and Agree with Plan of Care  Patient;Family member/caregiver    Family Member Consulted  brother-n-law       Patient will benefit from skilled therapeutic intervention in order to improve the following deficits and impairments:  Decreased coordination, Decreased range of motion, Difficulty walking, Improper body mechanics, Decreased endurance, Decreased safety awareness, Impaired sensation, Improper spinal/pelvic alignment, Impaired tone, Decreased knowledge of precautions, Decreased knowledge of use of DME, Decreased balance, Impaired UE functional use, Pain, Decreased cognition, Decreased mobility, Decreased strength, Impaired perceived functional ability, Impaired vision/preception  Visit Diagnosis: Hemiplegia and hemiparesis following cerebral infarction affecting right dominant side (HCC)  Other symptoms and signs involving cognitive functions following cerebral infarction  Other disturbances of skin sensation  Other symptoms and signs involving the nervous system    Problem List Patient Active Problem List   Diagnosis Date Noted  . Fall 09/17/2017  . Hemiparesis of right dominant side as late effect of cerebral infarction ([place]) 09/17/2017  . Shoulder subluxation, right, initial encounter 09/17/2017  . Cardioembolic stroke ([place]) 65/68/1275  . Weakness 09/17/2017  . Aphasia 09/17/2017  . 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 Cardiomyopathy ([place]) 09/17/2017  . H/O ischemic left MCA stroke 09/17/2017  . Cardiac LV ejection fraction 21-30%   . Graves disease   . Plaque psoriasis   . Neuropathy 05/19/2017    Carey Bullocks, OTR/L 11/18/2017, 10:39 AM  [facility] [address] [place], [place], [address] Phone: [phone]   Fax:  [phone]  Name: Margaret Ortiz MRN: [id number] Date of Birth: 1960/08/13

## 2017-11-20 ENCOUNTER — Ambulatory Visit: Payer: BLUE CROSS/BLUE SHIELD | Admitting: Occupational Therapy

## 2017-11-20 ENCOUNTER — Ambulatory Visit: Payer: BLUE CROSS/BLUE SHIELD | Admitting: Rehabilitation

## 2017-11-20 ENCOUNTER — Encounter: Payer: Self-pay | Admitting: Rehabilitation

## 2017-11-20 ENCOUNTER — Ambulatory Visit: Payer: BLUE CROSS/BLUE SHIELD | Admitting: Speech Pathology

## 2017-11-20 DIAGNOSIS — I69351 Hemiplegia and hemiparesis following cerebral infarction affecting right dominant side: Secondary | ICD-10-CM

## 2017-11-20 DIAGNOSIS — R2681 Unsteadiness on feet: Secondary | ICD-10-CM

## 2017-11-20 DIAGNOSIS — R2689 Other abnormalities of gait and mobility: Secondary | ICD-10-CM

## 2017-11-20 DIAGNOSIS — R4701 Aphasia: Secondary | ICD-10-CM

## 2017-11-20 DIAGNOSIS — R208 Other disturbances of skin sensation: Secondary | ICD-10-CM

## 2017-11-20 DIAGNOSIS — R29818 Other symptoms and signs involving the nervous system: Secondary | ICD-10-CM

## 2017-11-20 DIAGNOSIS — M6281 Muscle weakness (generalized): Secondary | ICD-10-CM

## 2017-11-20 NOTE — Patient Instructions (Signed)
Applications for Aphasia and home therapy activities   [facility] (free 30 day trial) [facility] (for iPhone) totally free [place]  Heads up Stop-fun categories What if [facility]

## 2017-11-20 NOTE — Therapy (Signed)
[facility] [address] [place], [place], [address] Phone: [phone]   Fax:  [phone]  Physical Therapy Treatment  Patient Details  Name: Margaret Ortiz MRN: [id number] Date of Birth: Mar 23, 1961 Referring Provider: Howard Pouch, DO   Encounter Date: 11/20/2017  PT End of Session - 11/20/17 2013    Visit Number  5    Number of Visits  17    Authorization Type  BCBS     PT Start Time  5638    PT Stop Time  1448    PT Time Calculation (min)  43 min    Equipment Utilized During Treatment  -- min guard to S    Activity Tolerance  Patient tolerated treatment well    Behavior During Therapy  [facility] for tasks assessed/performed       Past Medical History:  Diagnosis Date  . 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  . Cardiac LV ejection fraction 21-30%   . Cardioembolic stroke ([place]) 75/64/3329   Right hemiparesis  . Cardiomyopathy ([place]) 04/2017  . Dysphagia 05/19/2017   Last known diet upgraded to regular diet with thin liquids on 06/27/2017, no records available  . Graves disease   . Hyperlipidemia   . Hypertension   . Neuropathy 05/19/2017   s/p stroke  . Plaque psoriasis     Past Surgical History:  Procedure Laterality Date  . NO PAST SURGERIES      There were no vitals filed for this visit.  Subjective Assessment - 11/20/17 1407    Subjective  Pt reports no changes since last visit, no falls.     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      Currently in Pain?  No/denies                       [facility] Adult PT Treatment/Exercise - 11/20/17 1410      Ambulation/Gait   Ambulation/Gait  Yes    Ambulation/Gait Assistance  4: Min assist;3: Mod assist    Ambulation/Gait Assistance Details  Initiated use of Bioness with use of R lower cuff and R thigh cuff on hamstring for improved DF activation for improved foot clearance and decreased R knee recurvatum in stance phase of gait.  Had pt ambulate with SPC during training in order to better facilitate R lateral weight shift and R LE weight bearing.  Max cues for increased L step length (along with more controlled L step).  Requires max facilitation at pelvis for adequate weight shift and forward weight shift during gait.  Note marked improvement during second rep with use of SPC.     Ambulation Distance (Feet)  20 Feet then another 39' x 1, 70' x 1    Assistive device  Straight cane    Gait Pattern  Decreased stance time - right;Decreased step length - left;Decreased stride length;Decreased hip/knee flexion - right;Decreased dorsiflexion - right;Decreased weight shift to right;Right circumduction;Right hip hike;Lateral hip instability;Poor foot clearance - right    Ambulation Surface  Level;Indoor    Gait Comments  Pt needing several standing breaks initially as she wanted to keep stating her ankle was demonstrating increased ankle eversion.  Made adjustments as needed within session for more pure ankle DF.  Self-Care   Self-Care  Other Self-Care Comments    Other Self-Care Comments   Purpose and use of Bioness on RLE      Modalities   Modalities  Electrical Stimulation      Electrical Stimulation   Electrical Stimulation Location  R lower leg for DF activation, R hamstring to prevent recurvatum during stance.  Note that once quick fit electrodes were donned, stim produced increased ankle inversion, therefore switched to steering electrodes.  This took many attempts to get adequate DF with most neutral position.  Pt with increased sensitivity to stim and had difficulty not having overt ankle inversion/eversion.      Electrical Stimulation Action  ankle DF and R hamstring     Electrical Stimulation  Parameters  See parameters in tablet 1    Electrical Stimulation Goals  Neuromuscular facilitation             PT Education - 11/20/17 2012    Education provided  Yes    Education Details  purpose and benefits of Bioness on RLE    Person(s) Educated  Patient    Methods  Explanation    Comprehension  Verbalized understanding       PT Short Term Goals - 10/27/17 0738      PT SHORT TERM GOAL #1   Title  The patient will perform HEP with cues from family for LE strength, standing balance, and motor control.    Baseline  STG due date 11/27/2017    Time  4    Period  Weeks    Target Date  11/27/17      PT SHORT TERM GOAL #2   Title  The patient will improve gait speed from 0.85 ft/sec to > or equal to 1.4 ft/sec to demonstrate transition from "household ambulator" to "limited community ambulator" classification of gait.    Time  4    Period  Weeks    Target Date  11/27/17      PT SHORT TERM GOAL #3   Title  The patient will improve Berg balance score from 32/56 to > or equal to 38/56 to demo dec'ing risk for falls.    Time  4    Period  Weeks    Target Date  11/27/17      PT SHORT TERM GOAL #4   Title  The patient will negotiate 4 steps with one rail and step to pattern mod indep.    Time  4    Period  Weeks    Target Date  11/27/17      PT SHORT TERM GOAL #5   Title  The patient will ambulate x 500 ft nonstop mod indep for improving access to community surfaces.    Time  4    Period  Weeks    Target Date  11/27/17        PT Long Term Goals - 10/28/17 0740      PT LONG TERM GOAL #1   Title  The patient will perform HEP with intermittent assist for post d/c progression of exercise.    Time  8    Period  Weeks    Target Date  12/27/17      PT LONG TERM GOAL #2   Title  The patient will move floor<>stand with mod indep using L UE support on surface due to h/o falls.    Time  8    Period  Weeks    Target Date  12/27/17  PT LONG TERM GOAL #3   Title  The  patient will improve gait speed from 0.85 ft/sec to > or equal to 1.8 ft/sec to demo dec'ing risk for falls.    Time  8    Period  Weeks    Target Date  12/27/17      PT LONG TERM GOAL #4   Title  The patient will improve Berg score from 32/56 to > or equal to 44/56 to demo dec'ing risk for falls.    Time  8    Period  Weeks    Target Date  12/27/17      PT LONG TERM GOAL #5   Title  The patient will negotiate 12 steps without a handrail with step to pattern mod indep to access home entries.    Time  8    Period  Weeks    Target Date  12/27/17            Plan - 11/20/17 2013    Clinical Impression Statement  Session initiated use of Bioness on R lower leg for ankle DF during swing phase of gait (improved foot clearance) and R hamstrings for decreased R knee recurvatum during stance phase of gait.  Pt tolerated very well and began to note carryover with improved gait quality with stim removed.     Rehab Potential  Good    Clinical Impairments Affecting Rehab Potential  Ability to follow commands hindered during evaluation, patient appears motivated to participate    PT Frequency  2x / week eval +    PT Duration  8 weeks    PT Treatment/Interventions  ADLs/Self Care Home Management;Gait training;Stair training;Functional mobility training;Therapeutic activities;Therapeutic exercise;Balance training;Neuromuscular re-education;Patient/family education;Orthotic Fit/Training;Electrical Stimulation;Manual techniques;DME Instruction    PT Next Visit Plan  Continue with Bioness (tablet 1) for both lower and upper unit RLE, R side strengthening, hip initiation during gait tasks, right LE loading/weight shifting.  gait training (in clinic without SBQC to encourage more reciprocal pattern),     Consulted and Agree with Plan of Care  Family member/caregiver;Patient       Patient will benefit from skilled therapeutic intervention in order to improve the following deficits and impairments:   Abnormal gait, Impaired sensation, Pain, Postural dysfunction, Impaired tone, Decreased mobility, Decreased coordination, Decreased activity tolerance, Decreased endurance, Decreased strength, Difficulty walking, Decreased balance  Visit Diagnosis: Hemiplegia and hemiparesis following cerebral infarction affecting right dominant side (HCC)  Unsteadiness on feet  Muscle weakness (generalized)  Other abnormalities of gait and mobility     Problem List Patient Active Problem List   Diagnosis Date Noted  . Fall 09/17/2017  . Hemiparesis of right dominant side as late effect of cerebral infarction ([place]) 09/17/2017  . Shoulder subluxation, right, initial encounter 09/17/2017  . Cardioembolic stroke ([place]) 38/75/6433  . Weakness 09/17/2017  . Aphasia 09/17/2017  . 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 Cardiomyopathy ([place]) 09/17/2017  . H/O ischemic left MCA stroke 09/17/2017  . Cardiac LV ejection fraction 21-30%   . Graves disease   . Plaque psoriasis   . Neuropathy 05/19/2017    Cameron Sprang, PT, MPT [facility] [address] [place], [place], [address] Phone: [phone]   Fax:  [phone] 11/20/17, 8:16 PM  Name: Margaret Ortiz MRN: [id number] Date of Birth: June 25, 1960

## 2017-11-20 NOTE — Therapy (Signed)
[facility] [address] [place], [place], [address] Phone: [phone]   Fax:  [phone]  Occupational Therapy Treatment  Patient Details  Name: Margaret Ortiz MRN: [id number] Date of Birth: 1961/02/20 Referring Provider: Howard Pouch, DO   Encounter Date: 11/20/2017  OT End of Session - 11/20/17 1354    Visit Number  6    Number of Visits  24    Date for OT Re-Evaluation  12/27/17    Authorization Type  BC/BS    OT Start Time  1315    OT Stop Time  1400    OT Time Calculation (min)  45 min    Activity Tolerance  Patient tolerated treatment well       Past Medical History:  Diagnosis Date  . 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  . Cardiac LV ejection fraction 21-30%   . Cardioembolic stroke ([place]) 19/50/9326   Right hemiparesis  . Cardiomyopathy ([place]) 04/2017  . Dysphagia 05/19/2017   Last known diet upgraded to regular diet with thin liquids on 06/27/2017, no records available  . Graves disease   . Hyperlipidemia   . Hypertension   . Neuropathy 05/19/2017   s/p stroke  . Plaque psoriasis     Past Surgical History:  Procedure Laterality Date  . NO PAST SURGERIES      There were no vitals filed for this visit.  Subjective Assessment - 11/20/17 1321    Pertinent History  Lt MCA CVA 05/19/2017, Graves dz    Currently in Pain?  No/denies                   OT Treatments/Exercises (OP) - 11/20/17 0001      Neurological Re-education Exercises   Other Exercises 1  Supine: attempted elbow extension w/ no active movement against gravity. Chest press motion with max assist RUE. Followed by P/ROM RUE    Other Exercises 2  Closed chain low range AA/ROM for shoulder flex with elbow ext using tilted stool - pt w/ slightly better activation and control today    Other Weight-Bearing Exercises 1  BUE wt bearing  for sit to low level squatting position followed by holding position w/ small wt shifts to Rt side.     Other Weight-Bearing Exercises 2  Wt bearing in standing w/ small weight shifts to Rt side - pt fearful of shifting wt to Rt side      Modalities   Modalities  Electrical Stimulation      Electrical Stimulation   Electrical Stimulation Location  dorsal forearm    Electrical Stimulation Action  wrist and finger extension    Electrical Stimulation Parameters  50 pps, 250 pw, 10 sec. on/off cycle, ramp 2 x 15 minutes    Electrical Stimulation Goals  Neuromuscular facilitation               OT Short Term Goals - 11/11/17 1620      OT SHORT TERM GOAL #1   Title  Pt/family independent with HEP for RUE    Time  4    Period  Weeks    Status  On-going Pt has HEP already previously issued (reviewed by outpatient therapist)       OT SHORT TERM GOAL #2   Title  Independent with splint wear and care for Rt hand prn    Time  4    Period  Weeks    Status  On-going      OT SHORT TERM GOAL #3   Title  Pt/family to verbalize understanding with A/E and task modifications to increase indpendence with ADLS/IADLS (for bathing, tying shoes, cutting food, simple meal prep)    Time  4    Period  Weeks    Status  On-going      OT SHORT TERM GOAL #4   Title  Pt to perform bathing with only min assist and A/E prn while seated     Time  4    Period  Weeks    Status  On-going      OT SHORT TERM GOAL #5   Title  Pt to make sandwich w/ only supervision/cues prn    Time  4    Period  Weeks    Status  On-going      OT SHORT TERM GOAL #6   Title  Pt to demo 25 degrees shoulder flexion in prep for low level reaching with gross finger flexion    Time  4    Period  Weeks    Status  On-going      OT SHORT TERM GOAL #7   Title  Pt/family to verbalize understanding with strategies to increase independence and safety with medication management including memory strategies prn     Time  4     Period  Weeks    Status  On-going        OT Long Term Goals - 11/11/17 1620      OT LONG TERM GOAL #1   Title  Pt to perform shower transfers at mod I level w/ DME prn and no LOB    Time  8    Period  Weeks    Status  New      OT LONG TERM GOAL #2   Title  Pt to perform simple meal prep at sup level using A/E and task modifications prn    Time  8    Period  Weeks    Status  New      OT LONG TERM GOAL #3   Title  Pt to perform light house management tasks at sup level including: laundry, washing dishes, cleaning, making bed    Time  8    Period  Weeks    Status  New      OT LONG TERM GOAL #4   Title  Pt to demo 25% finger extension in prep for releasing objects Rt hand     Time  8    Period  Weeks    Status  New      OT LONG TERM GOAL #5   Title  Pt to perform environmental scanning while performing simple physical task at 90% accuracy    Time  8    Period  Weeks    Status  New      OT LONG TERM GOAL #6   Title  Pt to use Rt hand as stabalizer 25% of the time    Time  8    Period  Weeks    Status  New            Plan - 11/20/17 1355    Clinical Impression Statement  Pt slowly progressing toward STG's. Pt responded well to facilitation today    Occupational Profile and client history currently impacting functional performance  no significant PMH but current impairments extensive    Occupational performance deficits (Please  refer to evaluation for details):  ADL's;IADL's;Work;Leisure;Social Participation    Rehab Potential  Fair    Current Impairments/barriers affecting progress:  severity of deficits, unrealistic expectations    OT Frequency  2x / week    OT Duration  8 weeks    OT Treatment/Interventions  Self-care/ADL training;Moist Heat;DME and/or AE instruction;Splinting;Therapeutic activities;Psychosocial skills training;Aquatic Therapy;Cognitive remediation/compensation;Therapeutic exercise;Coping strategies training;Neuromuscular education;Functional  Mobility Training;Passive range of motion;Visual/perceptual remediation/compensation;Electrical Stimulation;Manual Therapy;Patient/family education    Plan  continue NMR, try estim for wrist/fingers    Consulted and Agree with Plan of Care  Patient       Patient will benefit from skilled therapeutic intervention in order to improve the following deficits and impairments:  Decreased coordination, Decreased range of motion, Difficulty walking, Improper body mechanics, Decreased endurance, Decreased safety awareness, Impaired sensation, Improper spinal/pelvic alignment, Impaired tone, Decreased knowledge of precautions, Decreased knowledge of use of DME, Decreased balance, Impaired UE functional use, Pain, Decreased cognition, Decreased mobility, Decreased strength, Impaired perceived functional ability, Impaired vision/preception  Visit Diagnosis: Hemiplegia and hemiparesis following cerebral infarction affecting right dominant side (HCC)  Other disturbances of skin sensation  Other symptoms and signs involving the nervous system  Unsteadiness on feet    Problem List Patient Active Problem List   Diagnosis Date Noted  . Fall 09/17/2017  . 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 Shoulder subluxation, right, initial encounter 09/17/2017  . Cardioembolic stroke ([place]) 01/02/1974  . Weakness 09/17/2017  . Aphasia 09/17/2017  . 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 Cardiomyopathy ([place]) 09/17/2017  . H/O ischemic left MCA stroke 09/17/2017  . Cardiac LV ejection fraction 21-30%   . Graves disease   . Plaque psoriasis   . Neuropathy 05/19/2017    Carey Bullocks, OTR/L 11/20/2017, 1:56 PM  [facility] [address] [place], [place], [address] Phone: [phone]   Fax:  [phone]  Name: Gertude Benito MRN: [id number] Date of Birth: 05-May-1961

## 2017-11-21 NOTE — Therapy (Signed)
[facility] [address] [place], [place], [address] Phone: [phone]   Fax:  [phone]  Speech Language Pathology Treatment  Patient Details  Name: Margaret Ortiz MRN: [id number] Date of Birth: 01-Dec-1960 Referring Provider: Ma Hillock, DO   Encounter Date: 11/20/2017  End of Session - 11/20/17 1449    Visit Number  6    Number of Visits  17    Date for SLP Re-Evaluation  01/09/18    Authorization Type  BCBS    SLP Start Time  1449    SLP Stop Time   1533    SLP Time Calculation (min)  44 min    Activity Tolerance  Patient tolerated treatment well       Past Medical History:  Diagnosis Date  . 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  . Cardiac LV ejection fraction 21-30%   . Cardioembolic stroke ([place]) 83/66/2947   Right hemiparesis  . Cardiomyopathy ([place]) 04/2017  . Dysphagia 05/19/2017   Last known diet upgraded to regular diet with thin liquids on 06/27/2017, no records available  . Graves disease   . Hyperlipidemia   . Hypertension   . Neuropathy 05/19/2017   s/p stroke  . Plaque psoriasis     Past Surgical History:  Procedure Laterality Date  . NO PAST SURGERIES      There were no vitals filed for this visit.  Subjective Assessment - 11/20/17 1452    Subjective  "I haven't seen you for long time."    Currently in Pain?  No/denies            ADULT SLP TREATMENT - 11/20/17 1449      General Information   Behavior/Cognition  Cooperative;Alert    Patient Positioning  Upright in chair      Treatment Provided   Treatment provided  Cognitive-Linquistic      Pain Assessment   Pain Assessment  No/denies pain      Cognitive-Linquistic Treatment   Treatment focused on  Aphasia    Skilled Treatment  SLP targeted simple naming with pictured icons on Lingraphica device; pt accuracy 85% for 1 and 2  syllable simple words. Word scrambles with picture icons 90% accuracy. With supportive conversation techniques and question cues to confirm pt's message, she was able to convey her questions about whether she would recover her language in 1 year, and whether this SLP had seen other pt's with her condition. SLP facilitated verbal expression and comprehension of simple conversation re: recovery timeline for stroke written keywords, drawings. SLP educated pt re: range of deficits with aphasia and factors on recovery including severity of CVA/impairments, time since onset. SLP explained that pt has deficits in both expressive and receptive language, and that given her level of impairment and stage of recovery it would not be expected for her to return to her baseline. SLP also encouraged pt, explaining that by making efforts, she has a good prognosis to continue to improve as she recovers some language abilities and learns new ways of communicating.      Assessment / Recommendations / Plan   Plan  Continue with current plan of care      Progression Toward Goals   Progression toward goals  Progressing toward goals       SLP Education - 11/20/17 1449    Education provided  Yes    Education Details  timeline and  prognosis for recovery, deficits, aphasia    Person(s) Educated  Patient    Methods  Explanation;Demonstration;Other (comment) visual cues   visual cues   Comprehension  Verbalized understanding;Need further instruction       SLP Short Term Goals - 11/20/17 1917      SLP SHORT TERM GOAL #1   Title  Pt will complete simple naming tasks with error awareness 80% accuracy with occasional min A (compensations allowed)    Time  2    Period  Weeks    Status  On-going      SLP SHORT TERM GOAL #2   Title  Pt will participate functionally in 5 minutes simple conversation with conversational supports for aphasia over 3 sessions.     Time  2    Period  Weeks    Status  On-going      SLP SHORT  TERM GOAL #3   Title  Pt will comprehend 3-step/3 component verbal directions with occasional min A 80% accuracy    Time  2    Period  Weeks    Status  On-going      SLP SHORT TERM GOAL #4   Title  Pt will undergo further assessment of writing, reading comprehension.    Time  2    Period  Weeks    Status  On-going      SLP SHORT TERM GOAL #5   Title  Pt will demo understanding of 8 minutes of simple conversation over 3 sessions with compensations for comprehension    Time  2    Period  Weeks    Status  On-going       SLP Long Term Goals - 11/20/17 1449      SLP LONG TERM GOAL #1   Title  Pt will participate functionally in 10 minutes simple-mod complex conversation with conversational supports for aphasia over 3 sessions.    Time  6    Period  Weeks    Status  On-going      SLP LONG TERM GOAL #2   Title  Pt will demo understanding of 10 minutes simple-mod complex conversation with compensations over 3 sessions    Time  6    Period  Weeks    Status  On-going      SLP LONG TERM GOAL #3   Title  Pt will demo error awareness by attempting correction or by nonverbal response to errors with 90% success    Time  6    Period  Weeks    Status  On-going       Plan - 11/20/17 1449    Clinical Impression Statement  Pt continues to present with moderate fluent aphasia affecting comprhension and expression. Auditory comprehension of multi sentence utterances requires extended time, reduced rate and gestures for A.  Paraphasias and neologisms persist affecting simple conversation. Continue skilled ST to maximize functional communication for independence and QOL.     Speech Therapy Frequency  2x / week    Treatment/Interventions  Multimodal communcation approach;Compensatory strategies;Language facilitation;Compensatory techniques;Cueing hierarchy;Internal/external aids;Functional tasks;SLP instruction and feedback;Patient/family education    Potential to Achieve Goals  Good     Potential Considerations  Severity of impairments    SLP Home Exercise Plan  provided    Consulted and Agree with Plan of Care  Patient       Patient will benefit from skilled therapeutic intervention in order to improve the following deficits and impairments:   Aphasia  Problem List Patient Active Problem List   Diagnosis Date Noted  . Fall 09/17/2017  . Hemiparesis of right dominant side as late effect of cerebral infarction ([place]) 09/17/2017  . Shoulder subluxation, right, initial encounter 09/17/2017  . Cardioembolic stroke ([place]) 18/29/9371  . Weakness 09/17/2017  . Aphasia 09/17/2017  . 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 Cardiomyopathy ([place]) 09/17/2017  . H/O ischemic left MCA stroke 09/17/2017  . Cardiac LV ejection fraction 21-30%   . Graves disease   . Plaque psoriasis   . Neuropathy 05/19/2017   Deneise Lever, [place], [place] Speech-Language Pathologist  Aliene Altes 11/21/2017, 7:15 AM  [facility] [address] [place], [place], [address] Phone: [phone]   Fax:  [phone]   Name: Margaret Ortiz MRN: [id number] Date of Birth: 1961/06/17

## 2017-11-24 ENCOUNTER — Ambulatory Visit: Payer: BLUE CROSS/BLUE SHIELD | Admitting: Speech Pathology

## 2017-11-24 ENCOUNTER — Ambulatory Visit: Payer: BLUE CROSS/BLUE SHIELD | Admitting: Occupational Therapy

## 2017-11-24 ENCOUNTER — Ambulatory Visit
Payer: BLUE CROSS/BLUE SHIELD | Attending: Family Medicine | Admitting: Rehabilitative and Restorative Service Providers"

## 2017-11-24 ENCOUNTER — Encounter: Payer: Self-pay | Admitting: Rehabilitative and Restorative Service Providers"

## 2017-11-24 DIAGNOSIS — R2681 Unsteadiness on feet: Secondary | ICD-10-CM | POA: Diagnosis present

## 2017-11-24 DIAGNOSIS — R29818 Other symptoms and signs involving the nervous system: Secondary | ICD-10-CM | POA: Diagnosis present

## 2017-11-24 DIAGNOSIS — R41842 Visuospatial deficit: Secondary | ICD-10-CM | POA: Insufficient documentation

## 2017-11-24 DIAGNOSIS — R208 Other disturbances of skin sensation: Secondary | ICD-10-CM | POA: Insufficient documentation

## 2017-11-24 DIAGNOSIS — R2689 Other abnormalities of gait and mobility: Secondary | ICD-10-CM | POA: Diagnosis present

## 2017-11-24 DIAGNOSIS — G8929 Other chronic pain: Secondary | ICD-10-CM | POA: Insufficient documentation

## 2017-11-24 DIAGNOSIS — I69351 Hemiplegia and hemiparesis following cerebral infarction affecting right dominant side: Secondary | ICD-10-CM | POA: Insufficient documentation

## 2017-11-24 DIAGNOSIS — R4701 Aphasia: Secondary | ICD-10-CM | POA: Insufficient documentation

## 2017-11-24 DIAGNOSIS — M25511 Pain in right shoulder: Secondary | ICD-10-CM | POA: Diagnosis present

## 2017-11-24 DIAGNOSIS — M6281 Muscle weakness (generalized): Secondary | ICD-10-CM

## 2017-11-24 DIAGNOSIS — I69318 Other symptoms and signs involving cognitive functions following cerebral infarction: Secondary | ICD-10-CM | POA: Insufficient documentation

## 2017-11-24 DIAGNOSIS — R278 Other lack of coordination: Secondary | ICD-10-CM | POA: Insufficient documentation

## 2017-11-24 DIAGNOSIS — R471 Dysarthria and anarthria: Secondary | ICD-10-CM | POA: Diagnosis present

## 2017-11-24 NOTE — Therapy (Signed)
[facility] [address] [place], [place], [address] Phone: [phone]   Fax:  [phone]  Speech Language Pathology Treatment  Patient Details  Name: Margaret Ortiz MRN: [id number] Date of Birth: 1961/01/22 Referring Provider: Ma Hillock, DO   Encounter Date: 11/24/2017  End of Session - 11/24/17 1807    Visit Number  7    Number of Visits  17    Date for SLP Re-Evaluation  01/09/18    SLP Start Time  1452 pt used restroom after PT    SLP Stop Time   5188    SLP Time Calculation (min)  38 min    Activity Tolerance  Patient tolerated treatment well       Past Medical History:  Diagnosis Date  . 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  . Cardiac LV ejection fraction 21-30%   . Cardioembolic stroke ([place]) 41/66/0630   Right hemiparesis  . Cardiomyopathy ([place]) 04/2017  . Dysphagia 05/19/2017   Last known diet upgraded to regular diet with thin liquids on 06/27/2017, no records available  . Graves disease   . Hyperlipidemia   . Hypertension   . Neuropathy 05/19/2017   s/p stroke  . Plaque psoriasis     Past Surgical History:  Procedure Laterality Date  . NO PAST SURGERIES      There were no vitals filed for this visit.  Subjective Assessment - 11/24/17 1452    Subjective  "Very slow."    Currently in Pain?  No/denies            ADULT SLP TREATMENT - 11/24/17 1452      General Information   Behavior/Cognition  Cooperative;Alert    Patient Positioning  Upright in chair      Treatment Provided   Treatment provided  Cognitive-Linquistic      Pain Assessment   Pain Assessment  No/denies pain      Cognitive-Linquistic Treatment   Treatment focused on  Aphasia    Skilled Treatment  SLP facilitated simple responsive naming with verbal, written cues required 75% of the time. Usual cues for awareness of  paraphasias. Auditory comprehension simple with cues 80% of the time (writing, drawing, gestures). Pt generated synonyms and antonyms, mod cues.      Assessment / Recommendations / Plan   Plan  Continue with current plan of care      Progression Toward Goals   Progression toward goals  Progressing toward goals         SLP Short Term Goals - 11/24/17 1807      SLP SHORT TERM GOAL #1   Title  Pt will complete simple naming tasks with error awareness 80% accuracy with occasional min A (compensations allowed)    Time  1    Period  Weeks    Status  On-going      SLP SHORT TERM GOAL #2   Title  Pt will participate functionally in 5 minutes simple conversation with conversational supports for aphasia over 3 sessions.     Time  1    Period  Weeks    Status  On-going      SLP SHORT TERM GOAL #3   Title  Pt will comprehend 3-step/3 component verbal directions with occasional min A 80% accuracy    Time  1    Period  Weeks    Status  On-going  SLP SHORT TERM GOAL #4   Title  Pt will undergo further assessment of writing, reading comprehension.    Time  1    Period  Weeks    Status  On-going      SLP SHORT TERM GOAL #5   Title  Pt will demo understanding of 8 minutes of simple conversation over 3 sessions with compensations for comprehension    Time  1    Period  Weeks    Status  On-going       SLP Long Term Goals - 11/24/17 1808      SLP LONG TERM GOAL #1   Title  Pt will participate functionally in 10 minutes simple-mod complex conversation with conversational supports for aphasia over 3 sessions.    Time  5    Period  Weeks    Status  On-going      SLP LONG TERM GOAL #2   Title  Pt will demo understanding of 10 minutes simple-mod complex conversation with compensations over 3 sessions    Time  5    Period  Weeks    Status  On-going      SLP LONG TERM GOAL #3   Title  Pt will demo error awareness by attempting correction or by nonverbal response to errors with  90% success    Time  5    Period  Weeks    Status  On-going       Plan - 11/24/17 1807    Clinical Impression Statement  Pt continues to present with moderate fluent aphasia affecting comprhension and expression. Auditory comprehension of multi sentence utterances requires extended time, reduced rate and gestures for A.  Paraphasias and neologisms persist affecting simple conversation. Continue skilled ST to maximize functional communication for independence and QOL.     Speech Therapy Frequency  2x / week    Treatment/Interventions  Multimodal communcation approach;Compensatory strategies;Language facilitation;Compensatory techniques;Cueing hierarchy;Internal/external aids;Functional tasks;SLP instruction and feedback;Patient/family education    Potential to Achieve Goals  Good    Potential Considerations  Severity of impairments    SLP Home Exercise Plan  provided    Consulted and Agree with Plan of Care  Patient       Patient will benefit from skilled therapeutic intervention in order to improve the following deficits and impairments:   Aphasia    Problem List Patient Active Problem List   Diagnosis Date Noted  . Fall 09/17/2017  . Hemiparesis of right dominant side as late effect of cerebral infarction ([facility] [place]) 09/17/2017  . Shoulder subluxation, right, initial encounter 09/17/2017  . Cardioembolic stroke ([place]) 09/73/5329  . Weakness 09/17/2017  . Aphasia 09/17/2017  . 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 Cardiomyopathy ([facility]) 09/17/2017  . H/O ischemic left MCA stroke 09/17/2017  . Cardiac LV ejection fraction 21-30%   . Graves disease   . Plaque psoriasis   . Neuropathy 05/19/2017   Deneise Lever, [place], [place] Speech-Language Pathologist  Aliene Altes 11/24/2017, 6:09 PM  [facility] [address] [place], [place], [address] Phone: [phone]   Fax:  [phone]   Name: Kelicia Youtz MRN: [id number] Date of Birth: 19-Oct-1960

## 2017-11-24 NOTE — Therapy (Signed)
[facility] [address] [place], [place], [address] Phone: [phone]   Fax:  [phone]  Occupational Therapy Treatment  Patient Details  Name: Margaret Ortiz MRN: [id number] Date of Birth: [birth year] Referring Provider: Howard Pouch, DO   Encounter Date: 11/24/2017  OT End of Session - 11/24/17 1351    Visit Number  7    Number of Visits  24    Date for OT Re-Evaluation  12/27/17    Authorization Type  BC/BS    OT Start Time  1318    OT Stop Time  1400    OT Time Calculation (min)  42 min    Activity Tolerance  Patient tolerated treatment well    Behavior During Therapy  [facility] for tasks assessed/performed       Past Medical History:  Diagnosis Date  . 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  . Cardiac LV ejection fraction 21-30%   . Cardioembolic stroke ([employer]) 62/70/3500   Right hemiparesis  . Cardiomyopathy ([place]) 04/2017  . Dysphagia 05/19/2017   Last known diet upgraded to regular diet with thin liquids on 06/27/2017, no records available  . Graves disease   . Hyperlipidemia   . Hypertension   . Neuropathy 05/19/2017   s/p stroke  . Plaque psoriasis     Past Surgical History:  Procedure Laterality Date  . NO PAST SURGERIES      There were no vitals filed for this visit.  Subjective Assessment - 11/24/17 1318    Subjective   My arm is ok    Pertinent History  Lt MCA CVA 05/19/2017, Graves dz    Currently in Pain?  No/denies                   OT Treatments/Exercises (OP) - 11/24/17 0001      Neurological Re-education Exercises   Other Exercises 1  AA/ROM in low range closed chain sh. flexion w/ elbow flexion using tilted stool w/ mod facilitation    Other Weight-Bearing Exercises 1  BUE wt bearing for sit to low level squatting position followed by holding position w/ small wt shifts to Rt side.      Other Weight-Bearing Exercises 2  Above exercise progressing to disengaging LUE w/ max support provided Rt sh. girdle      Electrical Stimulation   Electrical Stimulation Location  dorsal forearm    Electrical Stimulation Action  wrist and finger extension    Electrical Stimulation Parameters  50 pps, 250 pw, 10 sec. on/off cycle, ramp 2 sec. x 10 mintues total     Electrical Stimulation Goals  Neuromuscular facilitation               OT Short Term Goals - 11/11/17 1620      OT SHORT TERM GOAL #1   Title  Pt/family independent with HEP for RUE    Time  4    Period  Weeks    Status  On-going Pt has HEP already previously issued (reviewed by outpatient therapist)       OT SHORT TERM GOAL #2   Title  Independent with splint wear and care for Rt hand prn    Time  4    Period  Weeks    Status  On-going      OT SHORT TERM GOAL #3   Title  Pt/family to verbalize understanding with A/E and  task modifications to increase indpendence with ADLS/IADLS (for bathing, tying shoes, cutting food, simple meal prep)    Time  4    Period  Weeks    Status  On-going      OT SHORT TERM GOAL #4   Title  Pt to perform bathing with only min assist and A/E prn while seated     Time  4    Period  Weeks    Status  On-going      OT SHORT TERM GOAL #5   Title  Pt to make sandwich w/ only supervision/cues prn    Time  4    Period  Weeks    Status  On-going      OT SHORT TERM GOAL #6   Title  Pt to demo 25 degrees shoulder flexion in prep for low level reaching with gross finger flexion    Time  4    Period  Weeks    Status  On-going      OT SHORT TERM GOAL #7   Title  Pt/family to verbalize understanding with strategies to increase independence and safety with medication management including memory strategies prn     Time  4    Period  Weeks    Status  On-going        OT Long Term Goals - 11/11/17 1620      OT LONG TERM GOAL #1   Title  Pt to perform shower transfers at mod I  level w/ DME prn and no LOB    Time  8    Period  Weeks    Status  New      OT LONG TERM GOAL #2   Title  Pt to perform simple meal prep at sup level using A/E and task modifications prn    Time  8    Period  Weeks    Status  New      OT LONG TERM GOAL #3   Title  Pt to perform light house management tasks at sup level including: laundry, washing dishes, cleaning, making bed    Time  8    Period  Weeks    Status  New      OT LONG TERM GOAL #4   Title  Pt to demo 25% finger extension in prep for releasing objects Rt hand     Time  8    Period  Weeks    Status  New      OT LONG TERM GOAL #5   Title  Pt to perform environmental scanning while performing simple physical task at 90% accuracy    Time  8    Period  Weeks    Status  New      OT LONG TERM GOAL #6   Title  Pt to use Rt hand as stabalizer 25% of the time    Time  8    Period  Weeks    Status  New            Plan - 11/24/17 1352    Clinical Impression Statement  Pt progressing slowly RUE, with increased arm activation in closed chain low level AA/ROM and wt bearing activities    Occupational Profile and client history currently impacting functional performance  no significant PMH but current impairments extensive    Occupational performance deficits (Please refer to evaluation for details):  ADL's;IADL's;Work;Leisure;Social Participation    Rehab Potential  Fair    Current Impairments/barriers affecting  progress:  severity of deficits, unrealistic expectations    OT Frequency  2x / week    OT Duration  8 weeks    OT Treatment/Interventions  Self-care/ADL training;Moist Heat;DME and/or AE instruction;Splinting;Therapeutic activities;Psychosocial skills training;Aquatic Therapy;Cognitive remediation/compensation;Therapeutic exercise;Coping strategies training;Neuromuscular education;Functional Mobility Training;Passive range of motion;Visual/perceptual remediation/compensation;Electrical Stimulation;Manual  Therapy;Patient/family education    Plan  memory strategies, simple snack prep. If time, continue NMR and estim    Consulted and Agree with Plan of Care  Patient       Patient will benefit from skilled therapeutic intervention in order to improve the following deficits and impairments:  Decreased coordination, Decreased range of motion, Difficulty walking, Improper body mechanics, Decreased endurance, Decreased safety awareness, Impaired sensation, Improper spinal/pelvic alignment, Impaired tone, Decreased knowledge of precautions, Decreased knowledge of use of DME, Decreased balance, Impaired UE functional use, Pain, Decreased cognition, Decreased mobility, Decreased strength, Impaired perceived functional ability, Impaired vision/preception  Visit Diagnosis: Hemiplegia and hemiparesis following cerebral infarction affecting right dominant side (HCC)  Other disturbances of skin sensation  Other symptoms and signs involving the nervous system    Problem List Patient Active Problem List   Diagnosis Date Noted  . Fall 09/17/2017  . 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 Shoulder subluxation, right, initial encounter 09/17/2017  . Cardioembolic stroke ([place] [facility]) 62/94/7654  . Weakness 09/17/2017  . Aphasia 09/17/2017  . 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 Cardiomyopathy ([place]) 09/17/2017  . H/O ischemic left MCA stroke 09/17/2017  . Cardiac LV ejection fraction 21-30%   . Graves disease   . Plaque psoriasis   . Neuropathy 05/19/2017    [facility] Bullocks, OTR/L  11/24/2017, 1:54 PM  [facility] [address] [place], [place], [address] Phone: [phone]   Fax:  [phone]  Name: Margaret Ortiz MRN: [id number] Date of Birth: [birth year]

## 2017-11-24 NOTE — Therapy (Signed)
[facility] [address] [place], [place], [address] Phone: [phone]   Fax:  [phone]  Physical Therapy Treatment  Patient Details  Name: Margaret Ortiz MRN: [id number] Date of Birth: [birth year] Referring Provider: Howard Pouch, DO   Encounter Date: 11/24/2017  PT End of Session - 11/24/17 1509    Visit Number  6    Number of Visits  17    Date for PT Re-Evaluation  12/27/17    Authorization Type  BCBS     PT Start Time  6644    PT Stop Time  1445    PT Time Calculation (min)  40 min    Equipment Utilized During Treatment  Gait belt min guard to S    Activity Tolerance  Patient tolerated treatment well;Patient limited by fatigue    Behavior During Therapy  [facility] for tasks assessed/performed       Past Medical History:  Diagnosis Date  . 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  . Cardiac LV ejection fraction 21-30%   . Cardioembolic stroke ([place]) 03/47/4259   Right hemiparesis  . Cardiomyopathy ([place]) 04/2017  . Dysphagia 05/19/2017   Last known diet upgraded to regular diet with thin liquids on 06/27/2017, no records available  . Graves disease   . Hyperlipidemia   . Hypertension   . Neuropathy 05/19/2017   s/p stroke  . Plaque psoriasis     Past Surgical History:  Procedure Laterality Date  . NO PAST SURGERIES      There were no vitals filed for this visit.  Subjective Assessment - 11/24/17 1508    Subjective  The patient asks about removing AFO to walk.  PT reviewed that we can take it off for walking when using Bioness, but for safety, it is recommended to use it during other gait.    Pertinent History  cardiomyopathy, graves disease, shoulder subluxation s/p CVA    Patient Stated Goals  To get better to live independently and go back to research at [employer]      Currently in Pain?  No/denies                        [facility] Adult PT Treatment/Exercise - 11/24/17 1509      Transfers   Transfers  Sit to Stand;Stand to Sit    Sit to Stand  6: Modified independent (Device/Increase time)    Stand to Sit  6: Modified independent (Device/Increase time)      Ambulation/Gait   Ambulation/Gait  Yes    Ambulation/Gait Assistance  4: Min assist    Ambulation/Gait Assistance Details  continued use of Bioness R distal LE and hamstrings for knee flexion and foot clearance. PT had to decrease intensity from last session for tolerance to stimulation.  PT provided moderate tactile/facilitation cues at pelvis to increase right weight shifting, increase L step length and maintain hip upright (versus retraction).    Ambulation Distance (Feet)  60 Feet x 3 reps    Assistive device  Straight cane;Small based quad cane    Gait Pattern  Decreased stance time - right;Decreased step length - left;Decreased stride length;Decreased hip/knee flexion - right;Decreased dorsiflexion - right;Decreased weight shift to right;Right circumduction;Right hip hike;Lateral hip instability;Poor foot clearance - right    Ambulation Surface  Level;Indoor    Gait Comments  Began with SBQC until we got parameters  on bioness comfortable, then transitioned to [facility].  Also performed gait in parallel bars dec'ing UE support with min A x 10 feet x 6 repetitions.      Neuro Re-ed    Neuro Re-ed Details   Neuromuscular facilitation loading R LE for weight shifting in parallel bars performing step ups onto 4" step dec'ing L UE support with min A for R knee control and balance.      Exercises   Exercises  Other Exercises    Other Exercises   With bioness, faciitated knee flexion standing x 8 reps with PT assisting with movement.  Used bioness during right weight shifting activites.      Modalities   Modalities  Teacher, English as a foreign language Location  R anterior tiblialis and  hamstring    Electrical Stimulation Action  ankle DF and knee flexion    Electrical Stimulation Parameters  see parameters in bioness    Electrical Stimulation Goals  Neuromuscular facilitation               PT Short Term Goals - 10/27/17 0738      PT SHORT TERM GOAL #1   Title  The patient will perform HEP with cues from family for LE strength, standing balance, and motor control.    Baseline  STG due date 11/27/2017    Time  4    Period  Weeks    Target Date  11/27/17      PT SHORT TERM GOAL #2   Title  The patient will improve gait speed from 0.85 ft/sec to > or equal to 1.4 ft/sec to demonstrate transition from "household ambulator" to "limited community ambulator" classification of gait.    Time  4    Period  Weeks    Target Date  11/27/17      PT SHORT TERM GOAL #3   Title  The patient will improve Berg balance score from 32/56 to > or equal to 38/56 to demo dec'ing risk for falls.    Time  4    Period  Weeks    Target Date  11/27/17      PT SHORT TERM GOAL #4   Title  The patient will negotiate 4 steps with one rail and step to pattern mod indep.    Time  4    Period  Weeks    Target Date  11/27/17      PT SHORT TERM GOAL #5   Title  The patient will ambulate x 500 ft nonstop mod indep for improving access to community surfaces.    Time  4    Period  Weeks    Target Date  11/27/17        PT Long Term Goals - 10/28/17 0740      PT LONG TERM GOAL #1   Title  The patient will perform HEP with intermittent assist for post d/c progression of exercise.    Time  8    Period  Weeks    Target Date  12/27/17      PT LONG TERM GOAL #2   Title  The patient will move floor<>stand with mod indep using L UE support on surface due to h/o falls.    Time  8    Period  Weeks    Target Date  12/27/17      PT LONG TERM GOAL #3   Title  The patient will improve gait speed  from 0.85 ft/sec to > or equal to 1.8 ft/sec to demo dec'ing risk for falls.    Time  8     Period  Weeks    Target Date  12/27/17      PT LONG TERM GOAL #4   Title  The patient will improve Berg score from 32/56 to > or equal to 44/56 to demo dec'ing risk for falls.    Time  8    Period  Weeks    Target Date  12/27/17      PT LONG TERM GOAL #5   Title  The patient will negotiate 12 steps without a handrail with step to pattern mod indep to access home entries.    Time  8    Period  Weeks    Target Date  12/27/17            Plan - 11/24/17 1514    Clinical Impression Statement  The patient continues to need moderate tactile cues for improved gait mechanics.  Due to motor planning and communication challenges, tactile cues appear to be most effective at increasing right stance time.  PT to check STGs at next session to assess progress.  Plan to continue working to STGs/LTGs.     PT Treatment/Interventions  ADLs/Self Care Home Management;Gait training;Stair training;Functional mobility training;Therapeutic activities;Therapeutic exercise;Balance training;Neuromuscular re-education;Patient/family education;Orthotic Fit/Training;Electrical Stimulation;Manual techniques;DME Instruction    PT Next Visit Plan  CHECK SHORT TERM GOALS*  Continue with Bioness (tablet 1) for both lower and upper unit RLE, R side strengthening, hip initiation during gait tasks, right LE loading/weight shifting.  gait training (in clinic without SBQC to encourage more reciprocal pattern),     Consulted and Agree with Plan of Care  Patient       Patient will benefit from skilled therapeutic intervention in order to improve the following deficits and impairments:  Abnormal gait, Impaired sensation, Pain, Postural dysfunction, Impaired tone, Decreased mobility, Decreased coordination, Decreased activity tolerance, Decreased endurance, Decreased strength, Difficulty walking, Decreased balance  Visit Diagnosis: Other symptoms and signs involving the nervous system  Unsteadiness on feet  Muscle weakness  (generalized)  Other abnormalities of gait and mobility     Problem List Patient Active Problem List   Diagnosis Date Noted  . Fall 09/17/2017  . Hemiparesis of right dominant side as late effect of cerebral infarction ([place]) 09/17/2017  . Shoulder subluxation, right, initial encounter 09/17/2017  . Cardioembolic stroke ([place]) 99/24/2683  . Weakness 09/17/2017  . Aphasia 09/17/2017  . 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 Cardiomyopathy ([place]) 09/17/2017  . H/O ischemic left MCA stroke 09/17/2017  . Cardiac LV ejection fraction 21-30%   . Graves disease   . Plaque psoriasis   . Neuropathy 05/19/2017    Gaberiel Youngblood,PT 11/24/2017, 3:16 PM  [facility] [address] [place], [place], [address] Phone: [phone]   Fax:  [phone]  Name: Margaret Ortiz MRN: [id number] Date of Birth: [birth year]

## 2017-11-26 ENCOUNTER — Encounter: Payer: Self-pay | Admitting: Rehabilitative and Restorative Service Providers"

## 2017-11-26 ENCOUNTER — Ambulatory Visit: Payer: BLUE CROSS/BLUE SHIELD | Admitting: Occupational Therapy

## 2017-11-26 ENCOUNTER — Ambulatory Visit: Payer: BLUE CROSS/BLUE SHIELD | Admitting: Rehabilitative and Restorative Service Providers"

## 2017-11-26 DIAGNOSIS — R2681 Unsteadiness on feet: Secondary | ICD-10-CM

## 2017-11-26 DIAGNOSIS — R29818 Other symptoms and signs involving the nervous system: Secondary | ICD-10-CM

## 2017-11-26 DIAGNOSIS — I69351 Hemiplegia and hemiparesis following cerebral infarction affecting right dominant side: Secondary | ICD-10-CM

## 2017-11-26 DIAGNOSIS — M6281 Muscle weakness (generalized): Secondary | ICD-10-CM

## 2017-11-26 DIAGNOSIS — R2689 Other abnormalities of gait and mobility: Secondary | ICD-10-CM

## 2017-11-26 NOTE — Therapy (Signed)
[facility] [address] [place], [place], [address] Phone: [phone]   Fax:  [phone]  Physical Therapy Treatment  Patient Details  Name: Margaret Ortiz MRN: [id number] Date of Birth: Nov 08, 1960 Referring Provider: Howard Pouch, DO   Encounter Date: 11/26/2017  PT End of Session - 11/26/17 1536    Visit Number  7    Number of Visits  17    Date for PT Re-Evaluation  12/27/17    Authorization Type  BCBS     PT Start Time  1320    PT Stop Time  1408    PT Time Calculation (min)  48 min    Equipment Utilized During Treatment  Gait belt min guard to S    Activity Tolerance  Patient tolerated treatment well;Patient limited by fatigue    Behavior During Therapy  [facility] for tasks assessed/performed       Past Medical History:  Diagnosis Date  . 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  . Cardiac LV ejection fraction 21-30%   . Cardioembolic stroke ([place]) 72/82/0601   Right hemiparesis  . Cardiomyopathy ([place]) 04/2017  . Dysphagia 05/19/2017   Last known diet upgraded to regular diet with thin liquids on 06/27/2017, no records available  . Graves disease   . Hyperlipidemia   . Hypertension   . Neuropathy 05/19/2017   s/p stroke  . Plaque psoriasis     Past Surgical History:  Procedure Laterality Date  . NO PAST SURGERIES      There were no vitals filed for this visit.  Subjective Assessment - 11/26/17 1529    Subjective  The patient inquires about removing AFO to walk again.  PT reviewed that she needs it for now at home, but that we can try to begin working without it some in therapy.    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      Currently in Pain?  No/denies                        [facility] Adult PT Treatment/Exercise - 11/26/17 1530      Ambulation/Gait   Ambulation/Gait  Yes    Ambulation/Gait Assistance  4: Min assist    Ambulation/Gait Assistance Details  PT utilized stockinette to facilitate right foot clearance during gait activities.  *emphasized need for AFO to patient.    Ambulation Distance (Feet)  115 Feet x 2 reps    Assistive device  Straight cane;Small based quad cane    Gait Pattern  Decreased stance time - right;Decreased step length - left;Decreased stride length;Decreased hip/knee flexion - right;Decreased dorsiflexion - right;Decreased weight shift to right;Right circumduction;Right hip hike;Lateral hip instability;Poor foot clearance - right    Ambulation Surface  Level;Indoor    Gait Comments  Tactile, demo and verbal cues to elongate L step length.      Neuro Re-ed    Neuro Re-ed Details   Standing right weight shift without AFO donned.  Activities included:  !) Standing with L foot on compliant surface with PT facilitating R weight shift with some hip and knee cues for elongation.  2)  Standing in stride position emphasizing weight through right heel and faciitating hip positioing.  3) Sit>Stand with right  foot posteriorly placed and tactile cues to weight shift through right LE.  4) single leg stance on R leg with min to mod tactile cues with L UE support.  5) Standing with R UE supported on handle of walking stick (and PT blocking degrees of freedom of walking stick) with facilitation for R scapular and trunk engagement.      Exercises   Exercises  Other Exercises    Other Exercises   Seated ankle DF/PF with rocker board and then lateral mobility with board.  Supine knee to chest with manual facilitation, sidelying PNF D1/D2 pattern with facilitation of movement.  Unilateral R pelvic tilt.               PT Short Term Goals - 10/27/17 2440      PT SHORT TERM GOAL #1   Title  The patient will perform HEP  with cues from family for LE strength, standing balance, and motor control.    Baseline  STG due date 11/27/2017    Time  4    Period  Weeks    Target Date  11/27/17      PT SHORT TERM GOAL #2   Title  The patient will improve gait speed from 0.85 ft/sec to > or equal to 1.4 ft/sec to demonstrate transition from "household ambulator" to "limited community ambulator" classification of gait.    Time  4    Period  Weeks    Target Date  11/27/17      PT SHORT TERM GOAL #3   Title  The patient will improve Berg balance score from 32/56 to > or equal to 38/56 to demo dec'ing risk for falls.    Time  4    Period  Weeks    Target Date  11/27/17      PT SHORT TERM GOAL #4   Title  The patient will negotiate 4 steps with one rail and step to pattern mod indep.    Time  4    Period  Weeks    Target Date  11/27/17      PT SHORT TERM GOAL #5   Title  The patient will ambulate x 500 ft nonstop mod indep for improving access to community surfaces.    Time  4    Period  Weeks    Target Date  11/27/17        PT Long Term Goals - 10/28/17 0740      PT LONG TERM GOAL #1   Title  The patient will perform HEP with intermittent assist for post d/c progression of exercise.    Time  8    Period  Weeks    Target Date  12/27/17      PT LONG TERM GOAL #2   Title  The patient will move floor<>stand with mod indep using L UE support on surface due to h/o falls.    Time  8    Period  Weeks    Target Date  12/27/17      PT LONG TERM GOAL #3   Title  The patient will improve gait speed from 0.85 ft/sec to > or equal to 1.8 ft/sec to demo dec'ing risk for falls.    Time  8    Period  Weeks    Target Date  12/27/17      PT LONG TERM GOAL #4   Title  The patient will improve Berg score from 32/56 to > or equal to 44/56  to demo dec'ing risk for falls.    Time  8    Period  Weeks    Target Date  12/27/17      PT LONG TERM GOAL #5   Title  The patient will negotiate 12 steps without a handrail  with step to pattern mod indep to access home entries.    Time  8    Period  Weeks    Target Date  12/27/17            Plan - 11/26/17 1537    Clinical Impression Statement  PT beginning to review STGs anticipating that patient has made progress towards goals, but not met.  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  Also emphasizing R motor control and weight shifting.  Plan to formally assess goals next visit.     PT Treatment/Interventions  ADLs/Self Care Home Management;Gait training;Stair training;Functional mobility training;Therapeutic activities;Therapeutic exercise;Balance training;Neuromuscular re-education;Patient/family education;Orthotic Fit/Training;Electrical Stimulation;Manual techniques;DME Instruction    PT Next Visit Plan  CHECK SHORT TERM GOALS*  Continue with Bioness (tablet 1) for both lower and upper unit RLE, R side strengthening, hip initiation during gait tasks, right LE loading/weight shifting.  gait training (in clinic without SBQC to encourage more reciprocal pattern),     Consulted and Agree with Plan of Care  Patient       Patient will benefit from skilled therapeutic intervention in order to improve the following deficits and impairments:  Abnormal gait, Impaired sensation, Pain, Postural dysfunction, Impaired tone, Decreased mobility, Decreased coordination, Decreased activity tolerance, Decreased endurance, Decreased strength, Difficulty walking, Decreased balance  Visit Diagnosis: Unsteadiness on feet  Muscle weakness (generalized)  Other abnormalities of gait and mobility  Other symptoms and signs involving the nervous system     Problem List Patient Active Problem List   Diagnosis Date Noted  . Fall 09/17/2017  . Hemiparesis of right dominant side as late effect of cerebral infarction ([place]) 09/17/2017  . Shoulder subluxation, right, initial encounter 09/17/2017  . Cardioembolic stroke ([place]) 38/93/7342   . Weakness 09/17/2017  . Aphasia 09/17/2017  . 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 Cardiomyopathy ([place]) 09/17/2017  . H/O ischemic left MCA stroke 09/17/2017  . Cardiac LV ejection fraction 21-30%   . Graves disease   . Plaque psoriasis   . Neuropathy 05/19/2017    Kadin Bera, PT 11/26/2017, 3:38 PM  [facility] [address] [place], [place], [address] Phone: [phone]   Fax:  [phone]  Name: Margaret Ortiz MRN: [id number] Date of Birth: 1961-02-26

## 2017-11-26 NOTE — Patient Instructions (Signed)

## 2017-11-26 NOTE — Therapy (Signed)
[facility] [address] [place], [place], [address] Phone: [phone]   Fax:  [phone]  Occupational Therapy Treatment  Patient Details  Name: Margaret Ortiz MRN: [id number] Date of Birth: 1961/04/19 Referring Provider: Howard Pouch, DO   Encounter Date: 11/26/2017  OT End of Session - 11/26/17 1446    Visit Number  8    Number of Visits  24    Date for OT Re-Evaluation  12/27/17    Authorization Type  BC/BS    OT Start Time  1230    OT Stop Time  1318    OT Time Calculation (min)  48 min    Activity Tolerance  Patient tolerated treatment well    Behavior During Therapy  [facility] for tasks assessed/performed       Past Medical History:  Diagnosis Date  . 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  . Cardiac LV ejection fraction 21-30%   . Cardioembolic stroke ([place]) 78/93/8101   Right hemiparesis  . Cardiomyopathy ([place]) 04/2017  . Dysphagia 05/19/2017   Last known diet upgraded to regular diet with thin liquids on 06/27/2017, no records available  . Graves disease   . Hyperlipidemia   . Hypertension   . Neuropathy 05/19/2017   s/p stroke  . Plaque psoriasis     Past Surgical History:  Procedure Laterality Date  . NO PAST SURGERIES      There were no vitals filed for this visit.  Subjective Assessment - 11/26/17 1236    Subjective   I don't want to work on this stuff. I already know how to do it (re: ADLS/snack prep)    Pertinent History  Lt MCA CVA 05/19/2017, Graves dz    Currently in Pain?  No/denies                   OT Treatments/Exercises (OP) - 11/26/17 0001      ADLs   Functional Mobility  Functional mobility with quad cane in kitchen to prepare snack. Therapist had pt get out bread and butter and spread butter on bread, but pt had difficulty w/ this and therapist offered suggestions to make  easier. However, pt did not want to work on [employer] and only wants to focus on RUE retraining. Therapist explained rationale for working on functional tasks including to increase ease, safety and independence with ADLS in prep for potential returning to living alone, but pt did not wish to pursue further    ADL Comments  Pt shown and recommended cup holder for cane, one handed cutting board, pot stabalizer, ? one handed cutting kit to including cutting board, jar opener, can opener, and rocker knife. Pt provided handouts of each (therapist discussed A/E and provided handouts while pt on estim). Pt also provided w/ memory strategies handout, but reports no difficulty remembering medications, appts.       Acupuncturist Location  dorsal forearm    Electrical Stimulation Action  wrist and finger ext    Electrical Stimulation Parameters  50 pps, 250 pw, 10 sec. on/off cycle x 10 minutes while pt on estim - pt was shown A/E recommendations (see above)    Electrical Stimulation Goals  Neuromuscular facilitation             OT Education - 11/26/17 1430    Education provided  Yes    Education Details  memory  strategies, further A/E recommendation     Person(s) Educated  Patient    Methods  Explanation;Handout    Comprehension  Verbalized understanding       OT Short Term Goals - 11/26/17 1447      OT SHORT TERM GOAL #1   Title  Pt/family independent with HEP for RUE - 11/27/17    Time  4    Period  Weeks    Status  Achieved Pt has HEP already previously issued (reviewed by outpatient therapist)       OT SHORT TERM GOAL #2   Title  Independent with splint wear and care for Rt hand prn    Time  4    Period  Weeks    Status  Achieved      OT SHORT TERM GOAL #3   Title  Pt/family to verbalize understanding with A/E and task modifications to increase indpendence with ADLS/IADLS (for bathing, tying shoes, cutting food, simple meal prep)    Time  4    Period   Weeks    Status  Achieved      OT SHORT TERM GOAL #4   Title  Pt to perform bathing with only min assist and A/E prn while seated     Time  4    Period  Weeks    Status  On-going      OT SHORT TERM GOAL #5   Title  Pt to make sandwich w/ only supervision/cues prn    Time  4    Period  Weeks    Status  Achieved however, needs modifications and/or A/E to be independent      OT SHORT TERM GOAL #6   Title  Pt to demo 25 degrees shoulder flexion in prep for low level reaching with gross finger flexion    Time  4    Period  Weeks    Status  On-going      OT SHORT TERM GOAL #7   Title  Pt/family to verbalize understanding with strategies to increase independence and safety with medication management including memory strategies prn     Time  4    Period  Weeks    Status  Deferred        OT Long Term Goals - 11/26/17 1450      OT LONG TERM GOAL #1   Title  Pt to perform shower transfers at mod I level w/ DME prn and no LOB - 12/27/17    Time  8    Period  Weeks    Status  Deferred pt no longer wants to work on this      [place] #2   Title  Pt to perform simple meal prep at sup level using A/E and task modifications prn    Time  8    Period  Weeks    Status  Deferred Pt no longer wants to work on this      [place] #3   Title  Pt to perform light house management tasks at sup level including: laundry, washing dishes, cleaning, making bed    Time  8    Period  Weeks    Status  On-going Pt reports she is already washing dishes      OT LONG TERM GOAL #4   Title  Pt to demo 25% finger extension in prep for releasing objects Rt hand     Time  8  Period  Weeks    Status  On-going      OT LONG TERM GOAL #5   Title  Pt to perform environmental scanning while performing simple physical task at 90% accuracy    Time  8    Period  Weeks    Status  New      OT LONG TERM GOAL #6   Title  Pt to use Rt hand as stabalizer 25% of the time    Time  8    Period   Weeks    Status  New            Plan - 11/26/17 1448    Clinical Impression Statement  Pt has met some STG's. Pt resistant to accept task modifications and/or A/E for one handed techniques because she expects recovery in RUE. Pt became upset/tearful today and wants to just focus on RUE retraining going forward    Occupational Profile and client history currently impacting functional performance  no significant PMH but current impairments extensive    Occupational performance deficits (Please refer to evaluation for details):  ADL's;IADL's;Work;Leisure;Social Participation    Rehab Potential  Fair    Current Impairments/barriers affecting progress:  severity of deficits, unrealistic expectations    OT Frequency  2x / week    OT Duration  8 weeks    OT Treatment/Interventions  Self-care/ADL training;Moist Heat;DME and/or AE instruction;Splinting;Therapeutic activities;Psychosocial skills training;Aquatic Therapy;Cognitive remediation/compensation;Therapeutic exercise;Coping strategies training;Neuromuscular education;Functional Mobility Training;Passive range of motion;Visual/perceptual remediation/compensation;Electrical Stimulation;Manual Therapy;Patient/family education    Plan  NMR RUE and trunk, estim       Patient will benefit from skilled therapeutic intervention in order to improve the following deficits and impairments:  Decreased coordination, Decreased range of motion, Difficulty walking, Improper body mechanics, Decreased endurance, Decreased safety awareness, Impaired sensation, Improper spinal/pelvic alignment, Impaired tone, Decreased knowledge of precautions, Decreased knowledge of use of DME, Decreased balance, Impaired UE functional use, Pain, Decreased cognition, Decreased mobility, Decreased strength, Impaired perceived functional ability, Impaired vision/preception  Visit Diagnosis: Hemiplegia and hemiparesis following cerebral infarction affecting right dominant side  (HCC)  Other symptoms and signs involving the nervous system  Unsteadiness on feet    Problem List Patient Active Problem List   Diagnosis Date Noted  . Fall 09/17/2017  . Hemiparesis of right dominant side as late effect of cerebral infarction ([place]) 09/17/2017  . Shoulder subluxation, right, initial encounter 09/17/2017  . Cardioembolic stroke ([place]) 91/66/0600  . Weakness 09/17/2017  . Aphasia 09/17/2017  . 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 Cardiomyopathy ([place]) 09/17/2017  . H/O ischemic left MCA stroke 09/17/2017  . Cardiac LV ejection fraction 21-30%   . Graves disease   . Plaque psoriasis   . Neuropathy 05/19/2017    Carey Bullocks, OTR/L 11/26/2017, 2:52 PM  [facility] [address] [place], [place], [address] Phone: [phone]   Fax:  [phone]  Name: Margaret Ortiz MRN: [id number] Date of Birth: 1961/01/28

## 2017-11-27 ENCOUNTER — Ambulatory Visit: Payer: BLUE CROSS/BLUE SHIELD

## 2017-11-27 DIAGNOSIS — R4701 Aphasia: Secondary | ICD-10-CM

## 2017-11-27 DIAGNOSIS — R29818 Other symptoms and signs involving the nervous system: Secondary | ICD-10-CM | POA: Diagnosis not present

## 2017-11-27 NOTE — Therapy (Signed)
[facility] [address] [place], [place], [address] Phone: [phone]   Fax:  [phone]  Speech Language Pathology Treatment  Patient Details  Name: Margaret Ortiz MRN: [id number] Date of Birth: January 22, 1961 Referring Provider: Ma Hillock, DO   Encounter Date: 11/27/2017  End of Session - 11/27/17 1707    Visit Number  8    Number of Visits  17    Date for SLP Re-Evaluation  01/09/18    SLP Start Time  0936    SLP Stop Time   1016    SLP Time Calculation (min)  40 min    Activity Tolerance  Patient tolerated treatment well       Past Medical History:  Diagnosis Date  . 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  . Cardiac LV ejection fraction 21-30%   . Cardioembolic stroke ([place]) 09/73/5329   Right hemiparesis  . Cardiomyopathy ([place]) 04/2017  . Dysphagia 05/19/2017   Last known diet upgraded to regular diet with thin liquids on 06/27/2017, no records available  . Graves disease   . Hyperlipidemia   . Hypertension   . Neuropathy 05/19/2017   s/p stroke  . Plaque psoriasis     Past Surgical History:  Procedure Laterality Date  . NO PAST SURGERIES      There were no vitals filed for this visit.  Subjective Assessment - 11/27/17 1009    Subjective  "I - - have - - to go - so (grimace) - - slow."    Currently in Pain?  No/denies            ADULT SLP TREATMENT - 11/27/17 0001      General Information   Behavior/Cognition  Alert;Cooperative;Pleasant mood      Treatment Provided   Treatment provided  Cognitive-Linquistic      Cognitive-Linquistic Treatment   Treatment focused on  Aphasia    Skilled Treatment  SLP targeted pt's naming ability in strucutured tasks with divergnent naming. Although pt's responses had phonemic paraphasias/apraxic qualities she was able to successfully provide average 6 items in  a simple category with extra time and multiple attempts. Error awareness appeared good (not excellent) during this task. Pt attempted writing answers as well but writing demo'd aphasic qualities.       Assessment / Recommendations / Plan   Plan  Continue with current plan of care      Progression Toward Goals   Progression toward goals  Progressing toward goals         SLP Short Term Goals - 11/27/17 1709      SLP SHORT TERM GOAL #1   Title  Pt will complete simple naming tasks with error awareness 80% accuracy with occasional min A (compensations allowed)    Status  Achieved      SLP SHORT TERM GOAL #2   Title  Pt will participate functionally in 5 minutes simple conversation with conversational supports for aphasia over 3 sessions.     Status  Not Met      SLP SHORT TERM GOAL #3   Title  Pt will comprehend 3-step/3 component verbal directions with occasional min A 80% accuracy    Time  1    Period  Weeks    Status  On-going      SLP SHORT TERM GOAL #4   Title  Pt will undergo further assessment of writing, reading  comprehension.    Status  Partially Met writing      SLP SHORT TERM GOAL #5   Title  Pt will demo understanding of 8 minutes of simple conversation over 3 sessions with compensations for comprehension    Status  Partially Met       SLP Long Term Goals - 11/27/17 1710      SLP LONG TERM GOAL #1   Title  Pt will participate functionally in 10 minutes simple-mod complex conversation with conversational supports for aphasia over 3 sessions.    Time  5    Period  Weeks    Status  On-going      SLP LONG TERM GOAL #2   Title  Pt will demo understanding of 10 minutes simple-mod complex conversation with compensations over 3 sessions    Time  5    Period  Weeks    Status  On-going      SLP LONG TERM GOAL #3   Title  Pt will demo error awareness by attempting correction or by nonverbal response to errors with 90% success    Time  5    Period  Weeks    Status   On-going       Plan - 11/27/17 1707    Clinical Impression Statement  Pt continues to present with 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 Auditory comprehension of simple conversation requires extra time and some repeats to ensure pt understanding.  Paraphasias and neologisms persist affecting simple conversation, and aphasic written language has been observed today. Continue skilled ST to maximize functional communication for independence and QOL.     Speech Therapy Frequency  2x / week    Treatment/Interventions  Multimodal communcation approach;Compensatory strategies;Language facilitation;Compensatory techniques;Cueing hierarchy;Internal/external aids;Functional tasks;SLP instruction and feedback;Patient/family education    Potential to Achieve Goals  Good    Potential Considerations  Severity of impairments    SLP Home Exercise Plan  provided    Consulted and Agree with Plan of Care  Patient       Patient will benefit from skilled therapeutic intervention in order to improve the following deficits and impairments:   Aphasia    Problem List Patient Active Problem List   Diagnosis Date Noted  . Fall 09/17/2017  . Hemiparesis of right dominant side as late effect of cerebral infarction ([place]) 09/17/2017  . Shoulder subluxation, right, initial encounter 09/17/2017  . Cardioembolic stroke ([place]) 66/29/4765  . Weakness 09/17/2017  . Aphasia 09/17/2017  . 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 Cardiomyopathy ([place]) 09/17/2017  . H/O ischemic left MCA stroke 09/17/2017  . Cardiac LV ejection fraction 21-30%   . Graves disease   . Plaque psoriasis   . Neuropathy 05/19/2017    [facility] ,[place], CCC-SLP  11/27/2017, 5:12 PM  [facility] [address] [place], [place], [address] Phone: [phone]   Fax:  [phone]   Name: Margaret Ortiz MRN: [id number] Date of Birth:  20-Aug-1960

## 2017-12-02 ENCOUNTER — Ambulatory Visit: Payer: BLUE CROSS/BLUE SHIELD | Admitting: Physical Therapy

## 2017-12-02 ENCOUNTER — Ambulatory Visit: Payer: BLUE CROSS/BLUE SHIELD | Admitting: Occupational Therapy

## 2017-12-02 ENCOUNTER — Encounter: Payer: Self-pay | Admitting: Speech Pathology

## 2017-12-02 ENCOUNTER — Encounter: Payer: Self-pay | Admitting: Physical Therapy

## 2017-12-02 ENCOUNTER — Encounter: Payer: Self-pay | Admitting: Occupational Therapy

## 2017-12-02 ENCOUNTER — Ambulatory Visit: Payer: BLUE CROSS/BLUE SHIELD | Admitting: Speech Pathology

## 2017-12-02 DIAGNOSIS — R29818 Other symptoms and signs involving the nervous system: Secondary | ICD-10-CM | POA: Diagnosis not present

## 2017-12-02 DIAGNOSIS — I69318 Other symptoms and signs involving cognitive functions following cerebral infarction: Secondary | ICD-10-CM

## 2017-12-02 DIAGNOSIS — I69351 Hemiplegia and hemiparesis following cerebral infarction affecting right dominant side: Secondary | ICD-10-CM

## 2017-12-02 DIAGNOSIS — R278 Other lack of coordination: Secondary | ICD-10-CM

## 2017-12-02 DIAGNOSIS — G8929 Other chronic pain: Secondary | ICD-10-CM

## 2017-12-02 DIAGNOSIS — R2681 Unsteadiness on feet: Secondary | ICD-10-CM

## 2017-12-02 DIAGNOSIS — M6281 Muscle weakness (generalized): Secondary | ICD-10-CM

## 2017-12-02 DIAGNOSIS — R4701 Aphasia: Secondary | ICD-10-CM

## 2017-12-02 DIAGNOSIS — M25511 Pain in right shoulder: Secondary | ICD-10-CM

## 2017-12-02 DIAGNOSIS — R2689 Other abnormalities of gait and mobility: Secondary | ICD-10-CM

## 2017-12-02 DIAGNOSIS — R41842 Visuospatial deficit: Secondary | ICD-10-CM

## 2017-12-02 DIAGNOSIS — R208 Other disturbances of skin sensation: Secondary | ICD-10-CM

## 2017-12-02 NOTE — Therapy (Signed)
[facility] [address] [place], [place], [address] Phone: [phone]   Fax:  [phone]  Occupational Therapy Treatment  Patient Details  Name: Margaret Ortiz MRN: [id number] Date of Birth: 1960-08-01 Referring Provider: Howard Pouch, DO   Encounter Date: 12/02/2017  OT End of Session - 12/02/17 1625    Visit Number  9    Number of Visits  24    Date for OT Re-Evaluation  12/27/17    Authorization Type  BC/BS    OT Start Time  1448    OT Stop Time  1530    OT Time Calculation (min)  42 min    Activity Tolerance  Patient tolerated treatment well       Past Medical History:  Diagnosis Date  . 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  . Cardiac LV ejection fraction 21-30%   . Cardioembolic stroke ([place]) 41/28/7867   Right hemiparesis  . Cardiomyopathy ([place]) 04/2017  . Dysphagia 05/19/2017   Last known diet upgraded to regular diet with thin liquids on 06/27/2017, no records available  . Graves disease   . Hyperlipidemia   . Hypertension   . Neuropathy 05/19/2017   s/p stroke  . Plaque psoriasis     Past Surgical History:  Procedure Laterality Date  . NO PAST SURGERIES      There were no vitals filed for this visit.  Subjective Assessment - 12/02/17 1453    Subjective   No no when asked if she had pain in her R shoulder    Pertinent History  Lt MCA CVA 05/19/2017, Graves dz    Currently in Pain?  No/denies                   OT Treatments/Exercises (OP) - 12/02/17 0001      Neurological Re-education Exercises   Other Exercises 1  Neuro re ed in supine to address activation of proximal shoulder girdle in closed chain with PVC square ( using faciliated chest press and overhead reach to 95* as well as isolated bilateral elbow extension).  Transitioned into sitting and addressed R side leaning on RUE to  again address increasing proximal activity in R shoulder girdle.Pt initially very fearfull of weight shifting to R onto RUE (therapist with full stabilization of shoulder girdle initially) however as activity increased pt willing to begin to do reaching activities with LUE and eventually able to stabilize over RUE with only min assist from therapist to ensure alignment.  Transitioned into sitting and addressed low unilateral closed chain reach using stool - by end of session pt able to isolate out shoulder flexion along with elbow extension as well as shoulder extension with elbow flexion with vc's only.  Also addressed trunk control and midline postual control in standing and functional ambulation.                OT Short Term Goals - 12/02/17 1623      OT SHORT TERM GOAL #1   Title  Pt/family independent with HEP for RUE - 11/27/17    Time  4    Period  Weeks    Status  Achieved Pt has HEP already previously issued (reviewed by outpatient therapist)       OT SHORT TERM GOAL #2   Title  Independent with splint wear and care for Rt hand prn    Time  4  Period  Weeks    Status  Achieved      OT SHORT TERM GOAL #3   Title  Pt/family to verbalize understanding with A/E and task modifications to increase indpendence with ADLS/IADLS (for bathing, tying shoes, cutting food, simple meal prep)    Time  4    Period  Weeks    Status  Achieved      OT SHORT TERM GOAL #4   Title  Pt to perform bathing with only min assist and A/E prn while seated     Time  4    Period  Weeks    Status  On-going      OT SHORT TERM GOAL #5   Title  Pt to make sandwich w/ only supervision/cues prn    Time  4    Period  Weeks    Status  Achieved however, needs modifications and/or A/E to be independent      OT SHORT TERM GOAL #6   Title  Pt to demo 25 degrees shoulder flexion in prep for low level reaching with gross finger flexion    Time  4    Period  Weeks    Status  On-going      OT SHORT TERM  GOAL #7   Title  Pt/family to verbalize understanding with strategies to increase independence and safety with medication management including memory strategies prn     Time  4    Period  Weeks    Status  Deferred        OT Long Term Goals - 12/02/17 1624      OT LONG TERM GOAL #1   Title  Pt to perform shower transfers at mod I level w/ DME prn and no LOB - 12/27/17    Time  8    Period  Weeks    Status  Deferred pt no longer wants to work on this      [place] #2   Title  Pt to perform simple meal prep at sup level using A/E and task modifications prn    Time  8    Period  Weeks    Status  Deferred Pt no longer wants to work on this      [place] #3   Title  Pt to perform light house management tasks at sup level including: laundry, washing dishes, cleaning, making bed    Time  8    Period  Weeks    Status  On-going Pt reports she is already washing dishes      OT LONG TERM GOAL #4   Title  Pt to demo 25% finger extension in prep for releasing objects Rt hand     Time  8    Period  Weeks    Status  On-going      OT LONG TERM GOAL #5   Title  Pt to perform environmental scanning while performing simple physical task at 90% accuracy    Time  8    Period  Weeks    Status  New      OT LONG TERM GOAL #6   Title  Pt to use Rt hand as stabalizer 25% of the time    Time  8    Period  Weeks    Status  New            Plan - 12/02/17 1624    Clinical Impression Statement  Pt with progress  toward goals. Pt able to initiate active movement for RUE for body on arm as well as arm on body with arm in weight bearing on moveable surface.     Occupational Profile and client history currently impacting functional performance  no significant PMH but current impairments extensive    Occupational performance deficits (Please refer to evaluation for details):  ADL's;IADL's;Work;Leisure;Social Participation    Rehab Potential  Fair    Current Impairments/barriers  affecting progress:  severity of deficits, unrealistic expectations    OT Frequency  2x / week    OT Duration  8 weeks    OT Treatment/Interventions  Self-care/ADL training;Moist Heat;DME and/or AE instruction;Splinting;Therapeutic activities;Psychosocial skills training;Aquatic Therapy;Cognitive remediation/compensation;Therapeutic exercise;Coping strategies training;Neuromuscular education;Functional Mobility Training;Passive range of motion;Visual/perceptual remediation/compensation;Electrical Stimulation;Manual Therapy;Patient/family education    Plan  NMR RUE and trunk, estim    Consulted and Agree with Plan of Care  Patient       Patient will benefit from skilled therapeutic intervention in order to improve the following deficits and impairments:  Decreased coordination, Decreased range of motion, Difficulty walking, Improper body mechanics, Decreased endurance, Decreased safety awareness, Impaired sensation, Improper spinal/pelvic alignment, Impaired tone, Decreased knowledge of precautions, Decreased knowledge of use of DME, Decreased balance, Impaired UE functional use, Pain, Decreased cognition, Decreased mobility, Decreased strength, Impaired perceived functional ability, Impaired vision/preception  Visit Diagnosis: Hemiplegia and hemiparesis following cerebral infarction affecting right dominant side (HCC)  Other symptoms and signs involving the nervous system  Unsteadiness on feet  Muscle weakness (generalized)  Other disturbances of skin sensation  Other symptoms and signs involving cognitive functions following cerebral infarction  Chronic right shoulder pain  Visuospatial deficit  Other lack of coordination    Problem List Patient Active Problem List   Diagnosis Date Noted  . Fall 09/17/2017  . Hemiparesis of right dominant side as late effect of cerebral infarction ([place] [facility]) 09/17/2017  . Shoulder subluxation, right, initial encounter 09/17/2017  . Cardioembolic  stroke ([place]) 78/67/5449  . Weakness 09/17/2017  . Aphasia 09/17/2017  . 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 Cardiomyopathy ([place]) 09/17/2017  . H/O ischemic left MCA stroke 09/17/2017  . Cardiac LV ejection fraction 21-30%   . Graves disease   . Plaque psoriasis   . Neuropathy 05/19/2017    Quay Burow, OTR/L 12/02/2017, 4:26 PM  [facility] [address] [place], [place], [address] Phone: [phone]   Fax:  [phone]  Name: Margaret Ortiz MRN: [id number] Date of Birth: [birth year]

## 2017-12-02 NOTE — Therapy (Signed)
[facility] [address] [place], [place], [address] Phone: [phone]   Fax:  [phone]  Speech Language Pathology Treatment  Patient Details  Name: Margaret Ortiz MRN: [id number] Date of Birth: [birth year] Referring Provider: Ma Hillock, DO   Encounter Date: 12/02/2017  End of Session - 12/02/17 1042    Visit Number  9    Number of Visits  17    Date for SLP Re-Evaluation  01/09/18    Authorization Type  BCBS    SLP Start Time  0932    SLP Stop Time   4818    SLP Time Calculation (min)  42 min    Activity Tolerance  Patient tolerated treatment well       Past Medical History:  Diagnosis Date  . 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  . Cardiac LV ejection fraction 21-30%   . Cardioembolic stroke ([facility]) 56/31/4970   Right hemiparesis  . Cardiomyopathy ([place]) 04/2017  . Dysphagia 05/19/2017   Last known diet upgraded to regular diet with thin liquids on 06/27/2017, no records available  . Graves disease   . Hyperlipidemia   . Hypertension   . Neuropathy 05/19/2017   s/p stroke  . Plaque psoriasis     Past Surgical History:  Procedure Laterality Date  . NO PAST SURGERIES      There were no vitals filed for this visit.  Subjective Assessment - 12/02/17 0943    Subjective  "I uh I have gone slow" re: progress in all therapies    Currently in Pain?  No/denies            ADULT SLP TREATMENT - 12/02/17 1002      General Information   Behavior/Cognition  Alert;Cooperative;Pleasant mood      Treatment Provided   Treatment provided  Cognitive-Linquistic      Cognitive-Linquistic Treatment   Treatment focused on  Aphasia    Skilled Treatment  Pt expressing frustration re: "slowed" progress - educated re: plateau being normal. Pt tearful today. Facilitated verbal expression describing simple objects with  usual min to mod questioning cues. Auditory comprehensionan and resonsive naming targeted with pt IDing mid frequency objects to 3-4 sentence descriptions with 90% accuracy for auditory comprehension with usual gesture cues, and written and phonemic cues for naming. Written expression facilitated at word level, writing 3 items in simple category with fill in the blank cues, 70% accuracy      Assessment / Recommendations / Plan   Plan  Continue with current plan of care      Progression Toward Goals   Progression toward goals  Progressing toward goals       SLP Education - 12/02/17 1040    Education provided  Yes    Education Details  progress in [place], timeline and prognosis    Person(s) Educated  Patient    Methods  Explanation;Demonstration;Verbal cues    Comprehension  Verbalized understanding;Need further instruction       SLP Short Term Goals - 12/02/17 1041      SLP SHORT TERM GOAL #1   Title  Pt will complete simple naming tasks with error awareness 80% accuracy with occasional min A (compensations allowed)    Status  Achieved      SLP SHORT TERM GOAL #2   Title  Pt will participate functionally in 5 minutes simple conversation with conversational supports for aphasia  over 3 sessions.     Status  Not Met      SLP SHORT TERM GOAL #3   Title  Pt will comprehend 3-step/3 component verbal directions with occasional min A 80% accuracy    Time  1    Period  Weeks    Status  On-going      SLP SHORT TERM GOAL #4   Title  Pt will undergo further assessment of writing, reading comprehension.    Status  Partially Met writing      SLP SHORT TERM GOAL #5   Title  Pt will demo understanding of 8 minutes of simple conversation over 3 sessions with compensations for comprehension    Status  Partially Met       SLP Long Term Goals - 12/02/17 1042      SLP LONG TERM GOAL #1   Title  Pt will participate functionally in 10 minutes simple-mod complex conversation with conversational  supports for aphasia over 3 sessions.    Time  4    Period  Weeks    Status  On-going      SLP LONG TERM GOAL #2   Title  Pt will demo understanding of 10 minutes simple-mod complex conversation with compensations over 3 sessions    Time  4    Period  Weeks    Status  On-going      SLP LONG TERM GOAL #3   Title  Pt will demo error awareness by attempting correction or by nonverbal response to errors with 90% success    Time  4    Period  Weeks    Status  On-going       Plan - 12/02/17 1041    Clinical Impression Statement  Pt continues to present with 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 Auditory comprehension of simple conversation requires extra time and some repeats to ensure pt understanding.  Paraphasias and neologisms persist affecting simple conversation, and aphasic written language has been observed today. Continue skilled ST to maximize functional communication for independence and QOL.     Speech Therapy Frequency  2x / week    Treatment/Interventions  Multimodal communcation approach;Compensatory strategies;Language facilitation;Compensatory techniques;Cueing hierarchy;Internal/external aids;Functional tasks;SLP instruction and feedback;Patient/family education    Potential to Achieve Goals  Good    Potential Considerations  Severity of impairments    SLP Home Exercise Plan  provided    Consulted and Agree with Plan of Care  Patient       Patient will benefit from skilled therapeutic intervention in order to improve the following deficits and impairments:   Aphasia    Problem List Patient Active Problem List   Diagnosis Date Noted  . Fall 09/17/2017  . Hemiparesis of right dominant side as late effect of cerebral infarction ([facility]) 09/17/2017  . Shoulder subluxation, right, initial encounter 09/17/2017  . Cardioembolic stroke ([place]) 09/60/4540  . Weakness 09/17/2017  . Aphasia 09/17/2017  . 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 Cardiomyopathy ([place]) 09/17/2017  . H/O ischemic left MCA stroke 09/17/2017  . Cardiac LV ejection fraction 21-30%   . Graves disease   . Plaque psoriasis   . Neuropathy 05/19/2017    Briscoe Daniello, Annye Rusk MS, CCC-SLP 12/02/2017, 10:43 AM  [facility] [address] [place], [place], [address] Phone: [phone]   Fax:  [phone]   Name: Margaret Ortiz MRN: [id number] Date of Birth: 1961/05/17

## 2017-12-03 ENCOUNTER — Ambulatory Visit: Payer: BLUE CROSS/BLUE SHIELD | Admitting: Occupational Therapy

## 2017-12-03 ENCOUNTER — Encounter: Payer: Self-pay | Admitting: Occupational Therapy

## 2017-12-03 DIAGNOSIS — R29818 Other symptoms and signs involving the nervous system: Secondary | ICD-10-CM

## 2017-12-03 DIAGNOSIS — G8929 Other chronic pain: Secondary | ICD-10-CM

## 2017-12-03 DIAGNOSIS — R208 Other disturbances of skin sensation: Secondary | ICD-10-CM

## 2017-12-03 DIAGNOSIS — M6281 Muscle weakness (generalized): Secondary | ICD-10-CM

## 2017-12-03 DIAGNOSIS — I69351 Hemiplegia and hemiparesis following cerebral infarction affecting right dominant side: Secondary | ICD-10-CM

## 2017-12-03 DIAGNOSIS — R278 Other lack of coordination: Secondary | ICD-10-CM

## 2017-12-03 DIAGNOSIS — M25511 Pain in right shoulder: Secondary | ICD-10-CM

## 2017-12-03 DIAGNOSIS — R41842 Visuospatial deficit: Secondary | ICD-10-CM

## 2017-12-03 DIAGNOSIS — R2681 Unsteadiness on feet: Secondary | ICD-10-CM

## 2017-12-03 NOTE — Therapy (Signed)
[facility] [address] [place], [place], [address] Phone: [phone]   Fax:  [phone]  Physical Therapy Treatment  Patient Details  Name: Margaret Ortiz MRN: [id number] Date of Birth: 09/28/1960 Referring Provider: Renee Kuneff, DO   Encounter Date: 12/02/2017  PT End of Session - 12/03/17 0753    Visit Number  8    Number of Visits  17    Date for PT Re-Evaluation  12/27/17    Authorization Type  BCBS     PT Start Time  1405    PT Stop Time  1448    PT Time Calculation (min)  43 min       Past Medical History:  Diagnosis Date  . 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  . Cardiac LV ejection fraction 21-30%   . Cardioembolic stroke ([facility]) 05/19/2017   Right hemiparesis  . Cardiomyopathy ([facility]) 04/2017  . Dysphagia 05/19/2017   Last known diet upgraded to regular diet with thin liquids on 06/27/2017, no records available  . Graves disease   . Hyperlipidemia   . Hypertension   . Neuropathy 05/19/2017   s/p stroke  . Plaque psoriasis     Past Surgical History:  Procedure Laterality Date  . NO PAST SURGERIES      There were no vitals filed for this visit.  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      Currently in Pain?  No/denies                       [facility] Adult PT Treatment/Exercise - 12/03/17 0001      Ambulation/Gait   Ambulation/Gait  Yes    Ambulation/Gait Assistance  5: Supervision    Ambulation Distance (Feet)  424 Feet seated rest period then 56' additional for STG     Assistive device  Small  based quad cane    Gait Pattern  Decreased stance time - right;Decreased step length - left;Decreased stride length;Decreased hip/knee flexion - right;Decreased dorsiflexion - right;Decreased weight shift to right;Right circumduction;Right hip hike;Lateral hip instability;Poor foot clearance - right    Ambulation Surface  Level;Indoor    Stairs  Yes    Stairs Assistance  6: Modified independent (Device/Increase time)    Stair Management Technique  One rail Left;Forwards;Step to pattern    Number of Stairs  4    Height of Stairs  6    Gait Comments  STG assessment       Exercises   Exercises  Knee/Hip      Knee/Hip Exercises: Supine   Heel Slides  AAROM;Right;10 reps cues to increase Rt knee flexion as able    Bridges  AROM;Both;1 set;10 reps    Straight Leg Raises  AROM;Right;1 set;10 reps cues to keep Rt knee straight     Other Supine Knee/Hip Exercises  Pt performed Rt 1/2 bridging x 10 reps:  Rt hip abduction/adduction with yellow theraband in hooklying position x 10 reps;  hip flexion with knee flexed in hooklying x 10 reps with yellow band; Rt hip extension control   off side of mat x 10 reps with min assistance             PT Education - 12/03/17 0726    Education provided  Yes    Education Details  instructed pt in Rt hip abduction in hooklying and hip flexion with knee flexed in hookying with use of yellow theraband for both exercises - pt given yellow band but did not give pictures due to time constraint at end of session - NEEDS HANDOUT    Person(s) Educated  Patient    Methods  Explanation;Demonstration    Comprehension  Verbalized understanding;Returned demonstration       PT Short Term Goals - 12/03/17 0714      PT SHORT TERM GOAL #1   Title  The patient will perform HEP with cues from family for LE strength, standing balance, and motor control.    Baseline  Pt reports doing exercises at home - but only 3 exercises have been given to date - 12-02-17    Time  4     Period  Weeks    Status  Partially Met      PT SHORT TERM GOAL #2   Title  The patient will improve gait speed from 0.85 ft/sec to > or equal to 1.4 ft/sec to demonstrate transition from "household ambulator" to "limited community ambulator" classification of gait.    Status  New      PT SHORT TERM GOAL #3   Title  The patient will improve Berg balance score from 32/56 to > or equal to 38/56 to demo dec'ing risk for falls.    Time  4    Period  Weeks    Status  New      PT SHORT TERM GOAL #4   Title  The patient will negotiate 4 steps with one rail and step to pattern mod indep.    Baseline  met 12-02-17    Status  Achieved      PT SHORT TERM GOAL #5   Title  The patient will ambulate x 500 ft nonstop mod indep for improving access to community surfaces.    Baseline  1 seated rest period needed after (480-56)    Status  Partially Met        PT Long Term Goals - 10/28/17 0740      PT LONG TERM GOAL #1   Title  The patient will perform HEP with intermittent assist for post d/c progression of exercise.    Time  8    Period  Weeks    Target Date  12/27/17      PT LONG TERM GOAL #2   Title  The patient will move floor<>stand with mod indep using L UE support on surface due to h/o falls.    Time  8    Period  Weeks    Target Date  12/27/17      PT LONG TERM GOAL #3   Title  The patient will improve gait speed from 0.85 ft/sec to > or equal to 1.8 ft/sec to demo dec'ing risk for falls.    Time  8    Period  Weeks    Target Date  12/27/17      PT LONG TERM GOAL #4   Title  The patient will improve Berg score from 32/56 to > or equal to 44/56 to demo dec'ing risk for falls.    Time  8    Period  Weeks  Target Date  12/27/17      PT LONG TERM GOAL #5   Title  The patient will negotiate 12 steps without a handrail with step to pattern mod indep to access home entries.    Time  8    Period  Weeks    Target Date  12/27/17            Plan - 12/03/17 0755     Clinical Impression Statement  Pt has partially met STG #1 as pt reports doing HEP, however, exercises given to date have been minimal.  Exercises to be added to HEP.  STG's #2 and 3 not assessed due to time constraint with focus shifted to exercises to be added to HEP as pt requested more excercises to do at home.  Pt met STG #4 and partially met STG# 5 as pt able to amb. 500' with [facility] but required 1 seated rest period - unable to amb. this distance nonstop due to fatigue.      Rehab Potential  Good    Clinical Impairments Affecting Rehab Potential  Ability to follow commands hindered during evaluation, patient appears motivated to participate    PT Frequency  2x / week    PT Duration  8 weeks    PT Treatment/Interventions  ADLs/Self Care Home Management;Gait training;Stair training;Functional mobility training;Therapeutic activities;Therapeutic exercise;Balance training;Neuromuscular re-education;Patient/family education;Orthotic Fit/Training;Electrical Stimulation;Manual techniques;DME Instruction    PT Next Visit Plan  CHECK STG's #2 & 3 -- PLEASE GIVE EXERCISES in PT INSTRUCTIONS to pt to add to HEP - not done today due to lack of time - pt was given yellow theraband - Continue with [employer]ion[employer] (tablet 1) for both lower and upper unit RLE, R side strengthening, hip initiation during gait tasks, right LE loading/weight shifting.  gait training (in clinic without SBQC to encourage more reciprocal pattern),     Consulted and Agree with Plan of Care  Patient       Patient will benefit from skilled therapeutic intervention in order to improve the following deficits and impairments:  Abnormal gait, Impaired sensation, Pain, Postural dysfunction, Impaired tone, Decreased mobility, Decreased coordination, Decreased activity tolerance, Decreased endurance, Decreased strength, Difficulty walking, Decreased balance  Visit Diagnosis: Other abnormalities of gait and mobility  Muscle weakness  (generalized)     Problem List Patient Active Problem List   Diagnosis Date Noted  . Fall 09/17/2017  . Hemiparesis of right dominant side as late effect of cerebral infarction ([place]) 09/17/2017  . Shoulder subluxation, right, initial encounter 09/17/2017  . Cardioembolic stroke ([place]) 00/92/3300  . Weakness 09/17/2017  . Aphasia 09/17/2017  . 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 Cardiomyopathy ([place]) 09/17/2017  . H/O ischemic left MCA stroke 09/17/2017  . Cardiac LV ejection fraction 21-30%   . Graves disease   . Plaque psoriasis   . Neuropathy 05/19/2017    [place], PT 12/03/2017, 8:01 AM  [facility] [address] [place], [place], [address] Phone: [phone]   Fax:  [phone]  Name: Margaret Ortiz MRN: [id number] Date of Birth: 1961/03/21

## 2017-12-03 NOTE — Therapy (Signed)
[facility] [address] [place], [place], [address] Phone: [phone]   Fax:  [phone]  Occupational Therapy Treatment  Patient Details  Name: Margaret Ortiz MRN: [id number] Date of Birth: 04-20-1961 Referring Provider: Howard Pouch, DO   Encounter Date: 12/03/2017  OT End of Session - 12/03/17 1653    Visit Number  10    Number of Visits  24    Date for OT Re-Evaluation  12/27/17    Authorization Type  BC/BS    OT Start Time  1530    OT Stop Time  1617    OT Time Calculation (min)  47 min    Activity Tolerance  Patient tolerated treatment well       Past Medical History:  Diagnosis Date  . 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  . Cardiac LV ejection fraction 21-30%   . Cardioembolic stroke ([place]) 17/51/0258   Right hemiparesis  . Cardiomyopathy ([place]) 04/2017  . Dysphagia 05/19/2017   Last known diet upgraded to regular diet with thin liquids on 06/27/2017, no records available  . Graves disease   . Hyperlipidemia   . Hypertension   . Neuropathy 05/19/2017   s/p stroke  . Plaque psoriasis     Past Surgical History:  Procedure Laterality Date  . NO PAST SURGERIES      There were no vitals filed for this visit.  Subjective Assessment - 12/03/17 1643    Subjective   My hand is cold    Pertinent History  Lt MCA CVA 05/19/2017, Graves dz    Currently in Pain?  No/denies    Pain Score  0-No pain                   OT Treatments/Exercises (OP) - 12/03/17 0001      Neurological Re-education Exercises   Other Exercises 1  Neuro re education in sidelying to address isolated movement of forearm and elbow.  Followed with seated positon to address assisted shoulder flexion and extension.  Patient needs facilitation for weight shift to right, and forward.  When aligned over right side, with trunk flexed  slightly forward, able to activate shoulder flex/ ext.                 OT Short Term Goals - 12/02/17 1623      OT SHORT TERM GOAL #1   Title  Pt/family independent with HEP for RUE - 11/27/17    Time  4    Period  Weeks    Status  Achieved Pt has HEP already previously issued (reviewed by outpatient therapist)       OT SHORT TERM GOAL #2   Title  Independent with splint wear and care for Rt hand prn    Time  4    Period  Weeks    Status  Achieved      OT SHORT TERM GOAL #3   Title  Pt/family to verbalize understanding with A/E and task modifications to increase indpendence with ADLS/IADLS (for bathing, tying shoes, cutting food, simple meal prep)    Time  4    Period  Weeks    Status  Achieved      OT SHORT TERM GOAL #4   Title  Pt to perform bathing with only min assist and A/E prn while seated     Time  4    Period  Weeks    Status  On-going      OT SHORT TERM GOAL #5   Title  Pt to make sandwich w/ only supervision/cues prn    Time  4    Period  Weeks    Status  Achieved however, needs modifications and/or A/E to be independent      OT SHORT TERM GOAL #6   Title  Pt to demo 25 degrees shoulder flexion in prep for low level reaching with gross finger flexion    Time  4    Period  Weeks    Status  On-going      OT SHORT TERM GOAL #7   Title  Pt/family to verbalize understanding with strategies to increase independence and safety with medication management including memory strategies prn     Time  4    Period  Weeks    Status  Deferred        OT Long Term Goals - 12/02/17 1624      OT LONG TERM GOAL #1   Title  Pt to perform shower transfers at mod I level w/ DME prn and no LOB - 12/27/17    Time  8    Period  Weeks    Status  Deferred pt no longer wants to work on this      [place] #2   Title  Pt to perform simple meal prep at sup level using A/E and task modifications prn    Time  8    Period  Weeks    Status  Deferred Pt no longer wants  to work on this      [place] #3   Title  Pt to perform light house management tasks at sup level including: laundry, washing dishes, cleaning, making bed    Time  8    Period  Weeks    Status  On-going Pt reports she is already washing dishes      OT LONG TERM GOAL #4   Title  Pt to demo 25% finger extension in prep for releasing objects Rt hand     Time  8    Period  Weeks    Status  On-going      OT LONG TERM GOAL #5   Title  Pt to perform environmental scanning while performing simple physical task at 90% accuracy    Time  8    Period  Weeks    Status  New      OT LONG TERM GOAL #6   Title  Pt to use Rt hand as stabalizer 25% of the time    Time  8    Period  Weeks    Status  New            Plan - 12/03/17 1654    Clinical Impression Statement  Pt with progress toward goals. Pt able to initiate active movement for RUE for body on arm as well as arm on body with arm in weight bearing on moveable surface.     Occupational Profile and client history currently impacting functional performance  no significant PMH but current impairments extensive    Occupational performance deficits (Please refer to evaluation for details):  ADL's;IADL's;Work;Leisure;Social Participation    Rehab Potential  Fair    OT Frequency  2x / week    OT Duration  8 weeks    OT Treatment/Interventions  Self-care/ADL training;Moist Heat;DME and/or AE instruction;Splinting;Therapeutic activities;Psychosocial skills training;Aquatic  Therapy;Cognitive remediation/compensation;Therapeutic exercise;Coping strategies training;Neuromuscular education;Functional Mobility Training;Passive range of motion;Visual/perceptual remediation/compensation;Electrical Stimulation;Manual Therapy;Patient/family education    Plan  NMR RUE and trunk, estim    Clinical Decision Making  Several treatment options, min-mod task modification necessary    Consulted and Agree with Plan of Care  Patient       Patient  will benefit from skilled therapeutic intervention in order to improve the following deficits and impairments:  Decreased coordination, Decreased range of motion, Difficulty walking, Improper body mechanics, Decreased endurance, Decreased safety awareness, Impaired sensation, Improper spinal/pelvic alignment, Impaired tone, Decreased knowledge of precautions, Decreased knowledge of use of DME, Decreased balance, Impaired UE functional use, Pain, Decreased cognition, Decreased mobility, Decreased strength, Impaired perceived functional ability, Impaired vision/preception  Visit Diagnosis: Hemiplegia and hemiparesis following cerebral infarction affecting right dominant side (HCC)  Other disturbances of skin sensation  Muscle weakness (generalized)  Unsteadiness on feet  Other symptoms and signs involving the nervous system  Visuospatial deficit  Chronic right shoulder pain  Other lack of coordination    Problem List Patient Active Problem List   Diagnosis Date Noted  . Fall 09/17/2017  . 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 Shoulder subluxation, right, initial encounter 09/17/2017  . Cardioembolic stroke ([place]) 40/98/1191  . Weakness 09/17/2017  . Aphasia 09/17/2017  . 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 Cardiomyopathy ([place]) 09/17/2017  . H/O ischemic left MCA stroke 09/17/2017  . Cardiac LV ejection fraction 21-30%   . Graves disease   . Plaque psoriasis   . Neuropathy 05/19/2017    Mariah Milling, OTR/L 12/03/2017, 4:55 PM  [facility] [address] [place], [place], [address] Phone: [phone]   Fax:  [phone]  Name: Margaret Ortiz MRN: [id number] Date of Birth: 01-04-1961

## 2017-12-03 NOTE — Patient Instructions (Addendum)
HIP: Extension, Bridging Unilateral    Bend knee and place foot on surface. Push down through bent leg, tighten glutes. Raise hips up. ___ reps per set, ___ sets per day, ___ days per week     Hip Abduction / Adduction: with Knee Flexion (Supine)    With right knee bent, gently lower knee to side and return. Repeat __10__ times per set. Do _3___ sets per session. Do _1__ sessions per day.KEEP BOTH KNEES BENT - MOVE RIGHT LEG OUT TO SIDE AGAINST BAND - HOLD LEFT LEG STILL ; THEN MOVE LEFT LEG OUT TO SIDE - KEEP RIGHT LEG STILL - 10 REPS EACH LEG  Hip Flexion / Knee Extension: Straight-Leg Raise (Eccentric)    Lie on back. Lift leg with knee straight. Slowly lower leg for 3-5 seconds. __10_ reps per set, _3__ sets per day, _5__ days per week. Lower like elevator, stopping at each floor.  Rest on straight arms.   Hip Flexion - Supine    Lying on back, knees bent, feet on floor, bend hips, bringing knees toward trunk. Repeat _10  Times -  3 sets Do _1__ times per day.  Copyright  VHI. All rights reserved.   HIP EXTENSION CONTROL OFF SIDE OF MAT OR BED -see other picture

## 2017-12-04 ENCOUNTER — Ambulatory Visit: Payer: BLUE CROSS/BLUE SHIELD

## 2017-12-04 ENCOUNTER — Ambulatory Visit: Payer: BLUE CROSS/BLUE SHIELD | Admitting: Occupational Therapy

## 2017-12-04 ENCOUNTER — Encounter: Payer: Self-pay | Admitting: Occupational Therapy

## 2017-12-04 DIAGNOSIS — R471 Dysarthria and anarthria: Secondary | ICD-10-CM

## 2017-12-04 DIAGNOSIS — R29818 Other symptoms and signs involving the nervous system: Secondary | ICD-10-CM | POA: Diagnosis not present

## 2017-12-04 DIAGNOSIS — R2681 Unsteadiness on feet: Secondary | ICD-10-CM

## 2017-12-04 DIAGNOSIS — R2689 Other abnormalities of gait and mobility: Secondary | ICD-10-CM

## 2017-12-04 DIAGNOSIS — R41842 Visuospatial deficit: Secondary | ICD-10-CM

## 2017-12-04 DIAGNOSIS — G8929 Other chronic pain: Secondary | ICD-10-CM

## 2017-12-04 DIAGNOSIS — R4701 Aphasia: Secondary | ICD-10-CM

## 2017-12-04 DIAGNOSIS — M6281 Muscle weakness (generalized): Secondary | ICD-10-CM

## 2017-12-04 DIAGNOSIS — R208 Other disturbances of skin sensation: Secondary | ICD-10-CM

## 2017-12-04 DIAGNOSIS — M25511 Pain in right shoulder: Secondary | ICD-10-CM

## 2017-12-04 DIAGNOSIS — I69351 Hemiplegia and hemiparesis following cerebral infarction affecting right dominant side: Secondary | ICD-10-CM

## 2017-12-04 DIAGNOSIS — R278 Other lack of coordination: Secondary | ICD-10-CM

## 2017-12-04 NOTE — Therapy (Signed)
[facility] [address] [place], [place], [address] Phone: [phone]   Fax:  [phone]  Occupational Therapy Treatment  Patient Details  Name: Margaret Ortiz MRN: [id number] Date of Birth: 05/31/1961 Referring Provider: Howard Pouch, DO   Encounter Date: 12/04/2017  OT End of Session - 12/04/17 1715    Visit Number  11    Number of Visits  24    Date for OT Re-Evaluation  12/27/17    Authorization Type  BC/BS    OT Start Time  1447    OT Stop Time  1530    OT Time Calculation (min)  43 min    Activity Tolerance  Patient tolerated treatment well    Behavior During Therapy  [facility] for tasks assessed/performed       Past Medical History:  Diagnosis Date  . 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  . Cardiac LV ejection fraction 21-30%   . Cardioembolic stroke ([place]) 90/24/0973   Right hemiparesis  . Cardiomyopathy ([place]) 04/2017  . Dysphagia 05/19/2017   Last known diet upgraded to regular diet with thin liquids on 06/27/2017, no records available  . Graves disease   . Hyperlipidemia   . Hypertension   . Neuropathy 05/19/2017   s/p stroke  . Plaque psoriasis     Past Surgical History:  Procedure Laterality Date  . NO PAST SURGERIES      There were no vitals filed for this visit.  Subjective Assessment - 12/04/17 1452    Subjective   My brother wants to see you    Patient is accompained by:  Family member    Pertinent History  Lt MCA CVA 05/19/2017, Graves dz    Currently in Pain?  No/denies    Pain Score  0-No pain                   OT Treatments/Exercises (OP) - 12/04/17 0001      Neurological Re-education Exercises   Other Exercises 1  Patient's brother in law, Richardson Landry, came back today to ask about use of estim unit for home.  Deferred to patient's primary OT to address this issue.  It is evident that  Estim has been used in OT,- will allow primary OT to determine need for home unit.      Other Exercises 2  Worked on postural control and weight shifting toward right side in sitting, standing, kneeling and quadruped.  Patient needed max facilitation due to significant percetual deficits and now habit - however, able to reduce facilitation with repetiton, and patient able to activate right hip extensors and trunk extension with weight shift toward right side.  In quadruped, patient able to accept weight through right UE, once structures aligned in shoulder girdle and trunk.  Followed with active guided  reach in sitting position.               OT Education - 12/04/17 1714    Education provided  Yes    Education Details  importance of accepting weight on right side to help improve arm function    Person(s) Educated  Patient    Methods  Explanation;Handout    Comprehension  Verbalized understanding       OT Short Term Goals - 12/02/17 1623      OT SHORT TERM GOAL #1   Title  Pt/family independent with HEP for RUE - 11/27/17  Time  4    Period  Weeks    Status  Achieved Pt has HEP already previously issued (reviewed by outpatient therapist)       OT SHORT TERM GOAL #2   Title  Independent with splint wear and care for Rt hand prn    Time  4    Period  Weeks    Status  Achieved      OT SHORT TERM GOAL #3   Title  Pt/family to verbalize understanding with A/E and task modifications to increase indpendence with ADLS/IADLS (for bathing, tying shoes, cutting food, simple meal prep)    Time  4    Period  Weeks    Status  Achieved      OT SHORT TERM GOAL #4   Title  Pt to perform bathing with only min assist and A/E prn while seated     Time  4    Period  Weeks    Status  On-going      OT SHORT TERM GOAL #5   Title  Pt to make sandwich w/ only supervision/cues prn    Time  4    Period  Weeks    Status  Achieved however, needs modifications and/or A/E to be independent       OT SHORT TERM GOAL #6   Title  Pt to demo 25 degrees shoulder flexion in prep for low level reaching with gross finger flexion    Time  4    Period  Weeks    Status  On-going      OT SHORT TERM GOAL #7   Title  Pt/family to verbalize understanding with strategies to increase independence and safety with medication management including memory strategies prn     Time  4    Period  Weeks    Status  Deferred        OT Long Term Goals - 12/02/17 1624      OT LONG TERM GOAL #1   Title  Pt to perform shower transfers at mod I level w/ DME prn and no LOB - 12/27/17    Time  8    Period  Weeks    Status  Deferred pt no longer wants to work on this      [place] #2   Title  Pt to perform simple meal prep at sup level using A/E and task modifications prn    Time  8    Period  Weeks    Status  Deferred Pt no longer wants to work on this      [place] #3   Title  Pt to perform light house management tasks at sup level including: laundry, washing dishes, cleaning, making bed    Time  8    Period  Weeks    Status  On-going Pt reports she is already washing dishes      OT LONG TERM GOAL #4   Title  Pt to demo 25% finger extension in prep for releasing objects Rt hand     Time  8    Period  Weeks    Status  On-going      OT LONG TERM GOAL #5   Title  Pt to perform environmental scanning while performing simple physical task at 90% accuracy    Time  8    Period  Weeks    Status  New      OT LONG TERM GOAL #  6   Title  Pt to use Rt hand as stabalizer 25% of the time    Time  8    Period  Weeks    Status  New            Plan - 12/04/17 1715    Clinical Impression Statement  Patient showing progress toward OT goals.  Patient has significant perceptual deficits which impact her ability to functionally move right arm, although movement consistently elicited following facilitation    Occupational Profile and client history currently impacting functional  performance  no significant PMH but current impairments extensive    Occupational performance deficits (Please refer to evaluation for details):  ADL's;IADL's;Work;Leisure;Social Participation    Rehab Potential  Fair    Current Impairments/barriers affecting progress:  severity of deficits    OT Frequency  2x / week    OT Duration  8 weeks    OT Treatment/Interventions  Self-care/ADL training;Moist Heat;DME and/or AE instruction;Splinting;Therapeutic activities;Psychosocial skills training;Aquatic Therapy;Cognitive remediation/compensation;Therapeutic exercise;Coping strategies training;Neuromuscular education;Functional Mobility Training;Passive range of motion;Visual/perceptual remediation/compensation;Electrical Stimulation;Manual Therapy;Patient/family education    Plan  NMR RUE and trunk, estim    Clinical Decision Making  Several treatment options, min-mod task modification necessary    Consulted and Agree with Plan of Care  Patient       Patient will benefit from skilled therapeutic intervention in order to improve the following deficits and impairments:  Decreased coordination, Decreased range of motion, Difficulty walking, Improper body mechanics, Decreased endurance, Decreased safety awareness, Impaired sensation, Improper spinal/pelvic alignment, Impaired tone, Decreased knowledge of precautions, Decreased knowledge of use of DME, Decreased balance, Impaired UE functional use, Pain, Decreased cognition, Decreased mobility, Decreased strength, Impaired perceived functional ability, Impaired vision/preception  Visit Diagnosis: Unsteadiness on feet  Hemiplegia and hemiparesis following cerebral infarction affecting right dominant side (HCC)  Other disturbances of skin sensation  Muscle weakness (generalized)  Other symptoms and signs involving the nervous system  Visuospatial deficit  Chronic right shoulder pain  Other lack of coordination    Problem List Patient Active  Problem List   Diagnosis Date Noted  . Fall 09/17/2017  . 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 Shoulder subluxation, right, initial encounter 09/17/2017  . Cardioembolic stroke ([place]) 66/29/4765  . Weakness 09/17/2017  . Aphasia 09/17/2017  . 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 Cardiomyopathy ([place]) 09/17/2017  . H/O ischemic left MCA stroke 09/17/2017  . Cardiac LV ejection fraction 21-30%   . Graves disease   . Plaque psoriasis   . Neuropathy 05/19/2017    Margaret Ortiz, OTR/L 12/04/2017, 5:21 PM  [facility] [address] [place], [place], [address] Phone: [phone]   Fax:  [phone]  Name: Christy Ehrsam MRN: [id number] Date of Birth: 25-Jun-1960

## 2017-12-04 NOTE — Therapy (Signed)
[facility] [address] [place], [place], [address] Phone: [phone]   Fax:  [phone]  Speech Language Pathology Treatment  Patient Details  Name: Margaret Ortiz MRN: [id number] Date of Birth: Aug 07, 1960 Referring Provider: Ma Hillock, DO   Encounter Date: 12/04/2017  End of Session - 12/04/17 1201    Visit Number  10    Number of Visits  17    Date for SLP Re-Evaluation  01/09/18    SLP Start Time  0850    SLP Stop Time   0930    SLP Time Calculation (min)  40 min    Activity Tolerance  Patient tolerated treatment well       Past Medical History:  Diagnosis Date  . 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  . Cardiac LV ejection fraction 21-30%   . Cardioembolic stroke ([place]) 39/76/7341   Right hemiparesis  . Cardiomyopathy ([place]) 04/2017  . Dysphagia 05/19/2017   Last known diet upgraded to regular diet with thin liquids on 06/27/2017, no records available  . Graves disease   . Hyperlipidemia   . Hypertension   . Neuropathy 05/19/2017   s/p stroke  . Plaque psoriasis     Past Surgical History:  Procedure Laterality Date  . NO PAST SURGERIES      There were no vitals filed for this visit.  Subjective Assessment - 12/04/17 0855    Subjective  "How are you?"    Currently in Pain?  No/denies            ADULT SLP TREATMENT - 12/04/17 0855      General Information   Behavior/Cognition  Alert;Cooperative;Pleasant mood      Treatment Provided   Treatment provided  Cognitive-Linquistic      Cognitive-Linquistic Treatment   Treatment focused on  Aphasia    Skilled Treatment  In confrontation naming task, pt had 50% success and this increased to 80% with mod A. Pt named colors of objects f:5 with 100% success. SLP then engaged pt in conversation re: each item with pt requiring mod questioning cues and  phonemic cues.       Assessment / Recommendations / Plan   Plan  Continue with current plan of care      Progression Toward Goals   Progression toward goals  Progressing toward goals         SLP Short Term Goals - 12/02/17 1041      SLP SHORT TERM GOAL #1   Title  Pt will complete simple naming tasks with error awareness 80% accuracy with occasional min A (compensations allowed)    Status  Achieved      SLP SHORT TERM GOAL #2   Title  Pt will participate functionally in 5 minutes simple conversation with conversational supports for aphasia over 3 sessions.     Status  Not Met      SLP SHORT TERM GOAL #3   Title  Pt will comprehend 3-step/3 component verbal directions with occasional min A 80% accuracy    Time  1    Period  Weeks    Status  On-going      SLP SHORT TERM GOAL #4   Title  Pt will undergo further assessment of writing, reading comprehension.    Status  Partially Met writing      SLP SHORT TERM GOAL #5   Title  Pt will  demo understanding of 8 minutes of simple conversation over 3 sessions with compensations for comprehension    Status  Partially Met       SLP Long Term Goals - 12/04/17 1202      SLP LONG TERM GOAL #1   Title  Pt will participate functionally in 10 minutes simple-mod complex conversation with conversational supports for aphasia over 3 sessions.    Time  4    Period  Weeks    Status  On-going      SLP LONG TERM GOAL #2   Title  Pt will demo understanding of 10 minutes simple-mod complex conversation with compensations over 3 sessions    Time  4    Period  Weeks    Status  On-going      SLP LONG TERM GOAL #3   Title  Pt will demo error awareness by attempting correction or by nonverbal response to errors with 90% success    Time  4    Period  Weeks    Status  On-going       Plan - 12/04/17 1201    Clinical Impression Statement  Pt continues to present with 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 Auditory  comprehension of simple conversation requires extra time and some repeats to ensure pt understanding.  Paraphasias and neologisms persist affecting simple conversation, and aphasic written language has been observed today. Continue skilled ST to maximize functional communication for independence and QOL.     Speech Therapy Frequency  2x / week    Duration  -- 8 weeks, 17 sessions    Treatment/Interventions  Multimodal communcation approach;Compensatory strategies;Language facilitation;Compensatory techniques;Cueing hierarchy;Internal/external aids;Functional tasks;SLP instruction and feedback;Patient/family education    Potential to Achieve Goals  Good    Potential Considerations  Severity of impairments       Patient will benefit from skilled therapeutic intervention in order to improve the following deficits and impairments:   Aphasia  Dysarthria and anarthria    Problem List Patient Active Problem List   Diagnosis Date Noted  . Fall 09/17/2017  . 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 Shoulder subluxation, right, initial encounter 09/17/2017  . Cardioembolic stroke ([place]) 06/58/2608  . Weakness 09/17/2017  . Aphasia 09/17/2017  . 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 Cardiomyopathy ([place]) 09/17/2017  . H/O ischemic left MCA stroke 09/17/2017  . Cardiac LV ejection fraction 21-30%   . Graves disease   . Plaque psoriasis   . Neuropathy 05/19/2017    [facility] ,[place], CCC-SLP  12/04/2017, 12:02 PM  [facility] [address] [place], [place], [address] Phone: [phone]   Fax:  [phone]   Name: Margaret Ortiz MRN: [id number] Date of Birth: Apr 01, 1961

## 2017-12-04 NOTE — Patient Instructions (Signed)
HIP: Extension, Bridging Unilateral    Bend knee and place foot on surface. Push down through bent leg, tighten glutes. Raise hips up. ___ reps per set, ___ sets per day, ___ days per week     Hip Abduction / Adduction: with Knee Flexion (Supine)    With right knee bent, gently lower knee to side and return. Repeat __10__ times per set. Do _3___ sets per session. Do _1__ sessions per day.KEEP BOTH KNEES BENT - MOVE RIGHT LEG OUT TO SIDE AGAINST BAND - HOLD LEFT LEG STILL ; THEN MOVE LEFT LEG OUT TO SIDE - KEEP RIGHT LEG STILL - 10 REPS EACH LEG  Hip Flexion / Knee Extension: Straight-Leg Raise (Eccentric)    Lie on back. Lift leg with knee straight. Slowly lower leg for 3-5 seconds. __10_ reps per set, _3__ sets per day, _5__ days per week. Lower like elevator, stopping at each floor.  Rest on straight arms.   Hip Flexion - Supine    Lying on back, knees bent, feet on floor, bend hips, bringing knees toward trunk. Repeat _10  Times -  3 sets Do _1__ times per day.  Copyright  VHI. All rights reserved.   HIP EXTENSION CONTROL OFF SIDE OF MAT OR BED -see other picture        Instructions   HIP: Extension, Bridging Unilateral    Bend knee and place foot on surface. Push down through bent leg, tighten glutes. Raise hips up. ___ reps per set, ___ sets per day, ___ days per week     Hip Abduction / Adduction: with Knee Flexion (Supine)    With right knee bent, gently lower knee to side and return. Repeat __10__ times per set. Do _3___ sets per session. Do _1__ sessions per day.KEEP BOTH KNEES BENT - MOVE RIGHT LEG OUT TO SIDE AGAINST BAND - HOLD LEFT LEG STILL ; THEN MOVE LEFT LEG OUT TO SIDE - KEEP RIGHT LEG STILL - 10 REPS EACH LEG  Hip Flexion / Knee Extension: Straight-Leg Raise (Eccentric)    Lie on back. Lift leg with knee straight. Slowly lower leg for 3-5 seconds. __10_ reps per set, _3__ sets per day, _5__ days per week. Lower like elevator,  stopping at each floor.  Rest on straight arms.   Hip Flexion - Supine    Lying on back, knees bent, feet on floor, bend hips, bringing knees toward trunk. Repeat _10  Times -  3 sets Do _1__ times per day.  Copyright  VHI. All rights reserved.   HIP EXTENSION CONTROL OFF SIDE OF MAT OR BED -see other picture

## 2017-12-04 NOTE — Therapy (Signed)
[facility] [address] [place], [place], [address] Phone: [phone]   Fax:  [phone]  Physical Therapy Treatment  Patient Details  Name: Margaret Ortiz MRN: [id number] Date of Birth: 26-Sep-1960 Referring Provider: Howard Pouch, DO   Encounter Date: 12/04/2017  PT End of Session - 12/04/17 1519    Visit Number  9    Number of Visits  17    Date for PT Re-Evaluation  12/27/17    Authorization Type  BCBS     PT Start Time  1403    PT Stop Time  1442    PT Time Calculation (min)  39 min    Equipment Utilized During Treatment  -- min guard to S    Activity Tolerance  Patient tolerated treatment well    Behavior During Therapy  [facility] for tasks assessed/performed       Past Medical History:  Diagnosis Date  . 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  . Cardiac LV ejection fraction 21-30%   . Cardioembolic stroke ([place] [place]) 49/70/2637   Right hemiparesis  . Cardiomyopathy ([place] [facility]) 04/2017  . Dysphagia 05/19/2017   Last known diet upgraded to regular diet with thin liquids on 06/27/2017, no records available  . Graves disease   . Hyperlipidemia   . Hypertension   . Neuropathy 05/19/2017   s/p stroke  . Plaque psoriasis     Past Surgical History:  Procedure Laterality Date  . NO PAST SURGERIES      There were no vitals filed for this visit.  Subjective Assessment - 12/04/17 1410    Subjective  Pt reports no changes since last session and denied falls.     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      Currently in Pain?  No/denies                       [facility] Adult PT Treatment/Exercise - 12/04/17 1411      Ambulation/Gait   Ambulation/Gait  Yes     Ambulation/Gait Assistance  5: Supervision    Ambulation/Gait Assistance Details  Pt demonstrated improve stride length during first 100' but gait deviations incr. with longer distances.      Ambulation Distance (Feet)  100 Feet 125'    Assistive device  Small based quad cane    Gait Pattern  Decreased stance time - right;Decreased step length - left;Decreased stride length;Decreased hip/knee flexion - right;Decreased dorsiflexion - right;Decreased weight shift to right;Right circumduction;Right hip hike;Lateral hip instability;Poor foot clearance - right    Ambulation Surface  Level;Indoor    Gait velocity  0.21f/sec. and 0.79 ft/sec. (38.54sec. and 42.1 sec.)      Standardized Balance Assessment   Standardized Balance Assessment  Berg Balance Test      Berg Balance Test   Sit to Stand  Able to stand without using hands and stabilize independently    Standing Unsupported  Able to stand 2 minutes with supervision    Sitting with Back Unsupported but Feet Supported on Floor or Stool  Able to sit safely and securely 2 minutes    Stand to Sit  Sits safely with minimal use of hands    Transfers  Able to transfer safely, minor use of  hands    Standing Unsupported with Eyes Closed  Able to stand 10 seconds safely    Standing Ubsupported with Feet Together  Able to place feet together independently and stand for 1 minute with supervision    From Standing, Reach Forward with Outstretched Arm  Can reach forward >12 cm safely (5") 7"    From Standing Position, Pick up Object from [place] to pick up shoe, needs supervision    From Standing Position, Turn to Look Behind Over each Shoulder  Looks behind one side only/other side shows less weight shift    Turn 360 Degrees  Needs close supervision or verbal cueing    Standing Unsupported, Alternately Place Feet on Step/Stool  Needs assistance to keep from falling or unable to try    Standing Unsupported, One Foot in [place] to take small step  independently and hold 30 seconds    Standing on One Leg  Tries to lift leg/unable to hold 3 seconds but remains standing independently    Total Score  39            Self Care: PT Education - 12/04/17 1513    Education provided  Yes    Education Details  PT reviewed pt's HEP from last session and printed off a copy, as she told therapist last session that she didn't have a copy. Pt verbalized that she understood HEP. PT educated pt on goal progress and outcome measure results.     Person(s) Educated  Patient    Methods  Explanation;Verbal cues;Handout    Comprehension  Returned demonstration;Verbalized understanding       PT Short Term Goals - 12/04/17 1521      PT SHORT TERM GOAL #1   Title  The patient will perform HEP with cues from family for LE strength, standing balance, and motor control.    Baseline  Pt reports doing exercises at home - but only 3 exercises have been given to date - 12-02-17    Time  4    Period  Weeks    Status  Partially Met      PT SHORT TERM GOAL #2   Title  The patient will improve gait speed from 0.85 ft/sec to > or equal to 1.4 ft/sec to demonstrate transition from "household ambulator" to "limited community ambulator" classification of gait.    Status  Not Met      PT SHORT TERM GOAL #3   Title  The patient will improve Berg balance score from 32/56 to > or equal to 38/56 to demo dec'ing risk for falls.    Baseline  39/56 on 12/04/17    Time  4    Period  Weeks    Status  Achieved      PT SHORT TERM GOAL #4   Title  The patient will negotiate 4 steps with one rail and step to pattern mod indep.    Baseline  met 12-02-17    Status  Achieved      PT SHORT TERM GOAL #5   Title  The patient will ambulate x 500 ft nonstop mod indep for improving access to community surfaces.    Baseline  1 seated rest period needed after ([id number])    Status  Partially Met        PT Long Term Goals - 10/28/17 0740      PT LONG TERM GOAL #1   Title  The  patient will perform HEP with intermittent  assist for post d/c progression of exercise.    Time  8    Period  Weeks    Target Date  12/27/17      PT LONG TERM GOAL #2   Title  The patient will move floor<>stand with mod indep using L UE support on surface due to h/o falls.    Time  8    Period  Weeks    Target Date  12/27/17      PT LONG TERM GOAL #3   Title  The patient will improve gait speed from 0.85 ft/sec to > or equal to 1.8 ft/sec to demo dec'ing risk for falls.    Time  8    Period  Weeks    Target Date  12/27/17      PT LONG TERM GOAL #4   Title  The patient will improve Berg score from 32/56 to > or equal to 44/56 to demo dec'ing risk for falls.    Time  8    Period  Weeks    Target Date  12/27/17      PT LONG TERM GOAL #5   Title  The patient will negotiate 12 steps without a handrail with step to pattern mod indep to access home entries.    Time  8    Period  Weeks    Target Date  12/27/17            Plan - 12/04/17 1520    Clinical Impression Statement  Pt did not meet STG 2 but did meet STG 3. Pt's BERG score continues to indicate pt is at a high falls risk. Pt's gait speed did not improve, however, pt's gait does appear more fluid with improve stride length and weight shift onto RLE. Pt continues to require incr. time to ensure pt understands cues for activity 2/2 aphasia. Continue with POC.     Rehab Potential  Good    Clinical Impairments Affecting Rehab Potential  Ability to follow commands hindered during evaluation, patient appears motivated to participate    PT Frequency  2x / week    PT Duration  8 weeks    PT Treatment/Interventions  ADLs/Self Care Home Management;Gait training;Stair training;Functional mobility training;Therapeutic activities;Therapeutic exercise;Balance training;Neuromuscular re-education;Patient/family education;Orthotic Fit/Training;Electrical Stimulation;Manual techniques;DME Instruction    PT Next Visit Plan  Continue with  Bioness (tablet 1) for both lower and upper unit RLE, R side strengthening, hip initiation during gait tasks, right LE loading/weight shifting.  gait training (in clinic without SBQC to encourage more reciprocal pattern),     Consulted and Agree with Plan of Care  Patient       Patient will benefit from skilled therapeutic intervention in order to improve the following deficits and impairments:  Abnormal gait, Impaired sensation, Pain, Postural dysfunction, Impaired tone, Decreased mobility, Decreased coordination, Decreased activity tolerance, Decreased endurance, Decreased strength, Difficulty walking, Decreased balance  Visit Diagnosis: Unsteadiness on feet  Hemiplegia and hemiparesis following cerebral infarction affecting right dominant side (HCC)  Other abnormalities of gait and mobility     Problem List Patient Active Problem List   Diagnosis Date Noted  . Fall 09/17/2017  . Hemiparesis of right dominant side as late effect of cerebral infarction ([place]) 09/17/2017  . Shoulder subluxation, right, initial encounter 09/17/2017  . Cardioembolic stroke ([place]) 42/35/3614  . Weakness 09/17/2017  . Aphasia 09/17/2017  . 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 Cardiomyopathy ([place]) 09/17/2017  . H/O ischemic left MCA stroke  09/17/2017  . Cardiac LV ejection fraction 21-30%   . Graves disease   . Plaque psoriasis   . Neuropathy 05/19/2017    Bartlomiej Jenkinson L 12/04/2017, 3:23 PM  [facility] [address] [place], [place], [address] Phone: [phone]   Fax:  [phone]  Name: Arneshia Ade MRN: [id number] Date of Birth: [birth year]  Geoffry Paradise, PT,DPT 12/04/17 3:23 PM Phone: [phone] Fax: [phone]

## 2017-12-08 ENCOUNTER — Ambulatory Visit: Payer: BLUE CROSS/BLUE SHIELD | Admitting: Rehabilitative and Restorative Service Providers"

## 2017-12-08 ENCOUNTER — Ambulatory Visit: Payer: BLUE CROSS/BLUE SHIELD | Admitting: Speech Pathology

## 2017-12-08 ENCOUNTER — Ambulatory Visit: Payer: BLUE CROSS/BLUE SHIELD | Admitting: Occupational Therapy

## 2017-12-08 DIAGNOSIS — R29818 Other symptoms and signs involving the nervous system: Secondary | ICD-10-CM | POA: Diagnosis not present

## 2017-12-08 DIAGNOSIS — R2681 Unsteadiness on feet: Secondary | ICD-10-CM

## 2017-12-08 DIAGNOSIS — M6281 Muscle weakness (generalized): Secondary | ICD-10-CM

## 2017-12-08 DIAGNOSIS — R2689 Other abnormalities of gait and mobility: Secondary | ICD-10-CM

## 2017-12-08 DIAGNOSIS — I69351 Hemiplegia and hemiparesis following cerebral infarction affecting right dominant side: Secondary | ICD-10-CM

## 2017-12-08 DIAGNOSIS — R4701 Aphasia: Secondary | ICD-10-CM

## 2017-12-08 DIAGNOSIS — R41842 Visuospatial deficit: Secondary | ICD-10-CM

## 2017-12-08 NOTE — Patient Instructions (Signed)
Margaret Ortiz would like to change her schedule for OT   The therapist needs to contact your doctor FIRST, before we can change the schedule.  Santiago Glad (OT) sent a message to your doctor.   When your doctor responds, we can work to try to change your schedule.  Please have a family member come with you inside next week to help with changing your schedule.

## 2017-12-08 NOTE — Therapy (Signed)
[facility] [address] [place], [place], [address] Phone: [phone]   Fax:  [phone]  Speech Language Pathology Treatment  Patient Details  Name: Margaret Ortiz MRN: [id number] Date of Birth: 31-Aug-1960 Referring Provider: Ma Hillock, DO   Encounter Date: 12/08/2017  End of Session - 12/08/17 1754    Visit Number  11    Number of Visits  17    Date for SLP Re-Evaluation  01/09/18    Authorization Type  BCBS    SLP Start Time  1618    SLP Stop Time   1700    SLP Time Calculation (min)  42 min    Activity Tolerance  Patient tolerated treatment well       Past Medical History:  Diagnosis Date  . 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  . Cardiac LV ejection fraction 21-30%   . Cardioembolic stroke ([place]) 11/65/7903   Right hemiparesis  . Cardiomyopathy ([place]) 04/2017  . Dysphagia 05/19/2017   Last known diet upgraded to regular diet with thin liquids on 06/27/2017, no records available  . Graves disease   . Hyperlipidemia   . Hypertension   . Neuropathy 05/19/2017   s/p stroke  . Plaque psoriasis     Past Surgical History:  Procedure Laterality Date  . NO PAST SURGERIES      There were no vitals filed for this visit.  Subjective Assessment - 12/08/17 1750    Subjective  Pt having difficulty communicating about schedule with front office.    Currently in Pain?  No/denies            ADULT SLP TREATMENT - 12/08/17 1618      General Information   Behavior/Cognition  Alert;Cooperative;Pleasant mood      Treatment Provided   Treatment provided  Cognitive-Linquistic      Pain Assessment   Pain Assessment  No/denies pain      Cognitive-Linquistic Treatment   Treatment focused on  Aphasia    Skilled Treatment  SLP worked with pt on comprehension and expression in simple-mod complex conversation re:  scheduling issues. Pt benefitted from written keywords, drawings to faciliate comprehension and to confirm accuracy of message received by SLP. Pt tearful when discussing her stroke and desire to return to PLOF. SLP continued to educate re: recovery timeline for CVA and factors impacting recovery including severity of CVA and time post-onset. Pt reports completing many activities on [place] application at home and SLP encouraged pt to continue to engage in activities for language/cognition at home daily.       Assessment / Recommendations / Plan   Plan  Continue with current plan of care      Progression Toward Goals   Progression toward goals  Progressing toward goals         SLP Short Term Goals - 12/08/17 1754      SLP SHORT TERM GOAL #1   Title  Pt will complete simple naming tasks with error awareness 80% accuracy with occasional min A (compensations allowed)    Status  Achieved      SLP SHORT TERM GOAL #2   Title  Pt will participate functionally in 5 minutes simple conversation with conversational supports for aphasia over 3 sessions.     Status  Not Met      SLP SHORT TERM GOAL #3   Title  Pt will comprehend  3-step/3 component verbal directions with occasional min A 80% accuracy    Status  Not Met      SLP SHORT TERM GOAL #4   Title  Pt will undergo further assessment of writing, reading comprehension.    Status  Partially Met      SLP SHORT TERM GOAL #5   Title  Pt will demo understanding of 8 minutes of simple conversation over 3 sessions with compensations for comprehension    Status  Partially Met       SLP Long Term Goals - 12/08/17 1754      SLP LONG TERM GOAL #1   Title  Pt will participate functionally in 10 minutes simple-mod complex conversation with conversational supports for aphasia over 3 sessions.    Time  3    Period  Weeks    Status  On-going      SLP LONG TERM GOAL #2   Title  Pt will demo understanding of 10 minutes simple-mod complex  conversation with compensations over 3 sessions    Time  3    Period  Weeks    Status  On-going      SLP LONG TERM GOAL #3   Title  Pt will demo error awareness by attempting correction or by nonverbal response to errors with 90% success    Time  3    Period  Weeks    Status  On-going       Plan - 12/08/17 1803    Clinical Impression Statement  Pt continues to present with 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 Fluidity of simple conversation is improving; patient's awareness of errors deficient. She did spontaneously use drawing today when encountering anomia. Written keywords and drawings helpful for auditory comprehension. Paraphasias and neologisms persist affecting simple conversation, and aphasic written language has been observed today. Continue skilled ST to maximize functional communication for independence and QOL.     Speech Therapy Frequency  2x / week    Treatment/Interventions  Multimodal communcation approach;Compensatory strategies;Language facilitation;Compensatory techniques;Cueing hierarchy;Internal/external aids;Functional tasks;SLP instruction and feedback;Patient/family education    Potential Considerations  Severity of impairments    Consulted and Agree with Plan of Care  Patient       Patient will benefit from skilled therapeutic intervention in order to improve the following deficits and impairments:   Aphasia    Problem List Patient Active Problem List   Diagnosis Date Noted  . Fall 09/17/2017  . Hemiparesis of right dominant side as late effect of cerebral infarction ([facility]) 09/17/2017  . Shoulder subluxation, right, initial encounter 09/17/2017  . Cardioembolic stroke ([facility]) 68/34/1962  . Weakness 09/17/2017  . Aphasia 09/17/2017  . 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 Cardiomyopathy ([facility]) 09/17/2017  . H/O ischemic left MCA stroke 09/17/2017  . Cardiac LV ejection fraction 21-30%   . Graves disease   .  Plaque psoriasis   . Neuropathy 05/19/2017   Deneise Lever, [place], [place] 12/08/2017, 6:06 PM  [facility] [address] [place], [place], [address] Phone: [phone]   Fax:  [phone]   Name: Margaret Ortiz MRN: [id number] Date of Birth: [birth year]

## 2017-12-08 NOTE — Therapy (Signed)
[employer] [address] [place], [place], [address] Phone: [phone]   Fax:  [phone]  Occupational Therapy Treatment  Patient Details  Name: Margaret Ortiz MRN: [id number] Date of Birth: February 25, 1961 Referring Provider: Howard Pouch, DO   Encounter Date: 12/08/2017  OT End of Session - 12/08/17 1325    Visit Number  12    Number of Visits  24    Date for OT Re-Evaluation  12/27/17    Authorization Type  BC/BS    OT Start Time  1230    OT Stop Time  1315    OT Time Calculation (min)  45 min    Activity Tolerance  Patient tolerated treatment well    Behavior During Therapy  [facility] for tasks assessed/performed       Past Medical History:  Diagnosis Date  . 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  . Cardiac LV ejection fraction 21-30%   . Cardioembolic stroke ([place]) 35/57/3220   Right hemiparesis  . Cardiomyopathy ([place]) 04/2017  . Dysphagia 05/19/2017   Last known diet upgraded to regular diet with thin liquids on 06/27/2017, no records available  . Graves disease   . Hyperlipidemia   . Hypertension   . Neuropathy 05/19/2017   s/p stroke  . Plaque psoriasis     Past Surgical History:  Procedure Laterality Date  . NO PAST SURGERIES      There were no vitals filed for this visit.  Subjective Assessment - 12/08/17 1240    Subjective   My shoulder (re: slight pain with wt bearing on elbows, however later reported pain in hips and Lt leg)     Pertinent History  Lt MCA CVA 05/19/2017, Graves dz    Currently in Pain?  No/denies                   OT Treatments/Exercises (OP) - 12/08/17 0001      ADLs   ADL Comments  Adjusted Giv Mohr sling for better shoulder/arm alignment      Neurological Re-education Exercises   Other Weight-Bearing Exercises 1  BUE wt bearing for sit to low level squatting position  followed by holding position w/ small wt shifts to Rt side, then progressed to disengaging LUE. Standing: wt shifts to Rt side to activate as possible    Other Weight-Bearing Exercises 2  Attempted wt bearing prone on elbows w/ max support Rt shoulder - pt unable to tolerate very long, but had difficulty expressing if painful or not.       Acupuncturist Location  dorsal forearm    Electrical Stimulation Action  wrist and finger extensors    Electrical Stimulation Parameters  50 pps, 250 pw, 10 sec. on/off cycle, x 10 minutes               OT Short Term Goals - 12/02/17 1623      OT SHORT TERM GOAL #1   Title  Pt/family independent with HEP for RUE - 11/27/17    Time  4    Period  Weeks    Status  Achieved Pt has HEP already previously issued (reviewed by outpatient therapist)       OT SHORT TERM GOAL #2   Title  Independent with splint wear and care for Rt hand prn    Time  4    Period  Weeks  Status  Achieved      OT SHORT TERM GOAL #3   Title  Pt/family to verbalize understanding with A/E and task modifications to increase indpendence with ADLS/IADLS (for bathing, tying shoes, cutting food, simple meal prep)    Time  4    Period  Weeks    Status  Achieved      OT SHORT TERM GOAL #4   Title  Pt to perform bathing with only min assist and A/E prn while seated     Time  4    Period  Weeks    Status  On-going      OT SHORT TERM GOAL #5   Title  Pt to make sandwich w/ only supervision/cues prn    Time  4    Period  Weeks    Status  Achieved however, needs modifications and/or A/E to be independent      OT SHORT TERM GOAL #6   Title  Pt to demo 25 degrees shoulder flexion in prep for low level reaching with gross finger flexion    Time  4    Period  Weeks    Status  On-going      OT SHORT TERM GOAL #7   Title  Pt/family to verbalize understanding with strategies to increase independence and safety with medication management  including memory strategies prn     Time  4    Period  Weeks    Status  Deferred        OT Long Term Goals - 12/02/17 1624      OT LONG TERM GOAL #1   Title  Pt to perform shower transfers at mod I level w/ DME prn and no LOB - 12/27/17    Time  8    Period  Weeks    Status  Deferred pt no longer wants to work on this      [place] #2   Title  Pt to perform simple meal prep at sup level using A/E and task modifications prn    Time  8    Period  Weeks    Status  Deferred Pt no longer wants to work on this      [place] #3   Title  Pt to perform light house management tasks at sup level including: laundry, washing dishes, cleaning, making bed    Time  8    Period  Weeks    Status  On-going Pt reports she is already washing dishes      OT LONG TERM GOAL #4   Title  Pt to demo 25% finger extension in prep for releasing objects Rt hand     Time  8    Period  Weeks    Status  On-going      OT LONG TERM GOAL #5   Title  Pt to perform environmental scanning while performing simple physical task at 90% accuracy    Time  8    Period  Weeks    Status  New      OT LONG TERM GOAL #6   Title  Pt to use Rt hand as stabalizer 25% of the time    Time  8    Period  Weeks    Status  New            Plan - 12/08/17 1326    Clinical Impression Statement  Patient showing progress toward OT goals.  Patient has  significant perceptual deficits which impact her ability to functionally move right arm, although movement consistently elicited following facilitation    Occupational Profile and client history currently impacting functional performance  no significant PMH but current impairments extensive    Occupational performance deficits (Please refer to evaluation for details):  ADL's;IADL's;Work;Leisure;Social Participation    Rehab Potential  Fair    Current Impairments/barriers affecting progress:  severity of deficits    OT Frequency  2x / week    OT Duration  8  weeks    OT Treatment/Interventions  Self-care/ADL training;Moist Heat;DME and/or AE instruction;Splinting;Therapeutic activities;Psychosocial skills training;Aquatic Therapy;Cognitive remediation/compensation;Therapeutic exercise;Coping strategies training;Neuromuscular education;Functional Mobility Training;Passive range of motion;Visual/perceptual remediation/compensation;Electrical Stimulation;Manual Therapy;Patient/family education    Plan  NMR RUE and trunk, estim    Consulted and Agree with Plan of Care  Patient       Patient will benefit from skilled therapeutic intervention in order to improve the following deficits and impairments:  Decreased coordination, Decreased range of motion, Difficulty walking, Improper body mechanics, Decreased endurance, Decreased safety awareness, Impaired sensation, Improper spinal/pelvic alignment, Impaired tone, Decreased knowledge of precautions, Decreased knowledge of use of DME, Decreased balance, Impaired UE functional use, Pain, Decreased cognition, Decreased mobility, Decreased strength, Impaired perceived functional ability, Impaired vision/preception  Visit Diagnosis: Hemiplegia and hemiparesis following cerebral infarction affecting right dominant side (HCC)  Visuospatial deficit  Unsteadiness on feet    Problem List Patient Active Problem List   Diagnosis Date Noted  . Fall 09/17/2017  . Hemiparesis of right dominant side as late effect of cerebral infarction ([place]) 09/17/2017  . Shoulder subluxation, right, initial encounter 09/17/2017  . Cardioembolic stroke ([place]) 29/92/4268  . Weakness 09/17/2017  . Aphasia 09/17/2017  . 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 Cardiomyopathy ([place]) 09/17/2017  . H/O ischemic left MCA stroke 09/17/2017  . Cardiac LV ejection fraction 21-30%   . Graves disease   . Plaque psoriasis   . Neuropathy 05/19/2017    Carey Bullocks, OTR/L 12/08/2017, 1:27 PM  [facility] [address] [place], [place], [address] Phone: [phone]   Fax:  [phone]  Name: Margaret Ortiz MRN: [id number] Date of Birth: 08/10/1960

## 2017-12-09 ENCOUNTER — Encounter: Payer: Self-pay | Admitting: Rehabilitative and Restorative Service Providers"

## 2017-12-09 ENCOUNTER — Ambulatory Visit: Payer: BLUE CROSS/BLUE SHIELD | Admitting: Occupational Therapy

## 2017-12-09 DIAGNOSIS — R29818 Other symptoms and signs involving the nervous system: Secondary | ICD-10-CM | POA: Diagnosis not present

## 2017-12-09 DIAGNOSIS — R41842 Visuospatial deficit: Secondary | ICD-10-CM

## 2017-12-09 DIAGNOSIS — I69351 Hemiplegia and hemiparesis following cerebral infarction affecting right dominant side: Secondary | ICD-10-CM

## 2017-12-09 DIAGNOSIS — M6281 Muscle weakness (generalized): Secondary | ICD-10-CM

## 2017-12-09 NOTE — Therapy (Signed)
[facility] [address] [place], [place], [address] Phone: [phone]   Fax:  [phone]  Occupational Therapy Treatment  Patient Details  Name: Margaret Ortiz MRN: [id number] Date of Birth: 11/09/1960 Referring Provider: Howard Pouch, DO   Encounter Date: 12/09/2017  OT End of Session - 12/09/17 1216    Visit Number  13    Number of Visits  24    Date for OT Re-Evaluation  12/27/17    Authorization Type  BC/BS    OT Start Time  1100    OT Stop Time  1148    OT Time Calculation (min)  48 min    Activity Tolerance  Patient tolerated treatment well       Past Medical History:  Diagnosis Date  . 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  . Cardiac LV ejection fraction 21-30%   . Cardioembolic stroke ([place]) 28/31/5176   Right hemiparesis  . Cardiomyopathy ([place]) 04/2017  . Dysphagia 05/19/2017   Last known diet upgraded to regular diet with thin liquids on 06/27/2017, no records available  . Graves disease   . Hyperlipidemia   . Hypertension   . Neuropathy 05/19/2017   s/p stroke  . Plaque psoriasis     Past Surgical History:  Procedure Laterality Date  . NO PAST SURGERIES      There were no vitals filed for this visit.  Subjective Assessment - 12/09/17 1207    Subjective   Pt had pain Rt shoulder only with UBE which was d/c after 3 min    Pertinent History  Lt MCA CVA 05/19/2017, Graves dz    Currently in Pain?  No/denies                   OT Treatments/Exercises (OP) - 12/09/17 0001      ADLs   ADL Comments  Discussion w/ family re: pt's refusal to work on ADLs and task modifications/compensatory strategies to increase independence and safety with ADLS. Therapist explained that even though sister's desire is to work on these things, therapist cannot work on these things if pt does not want to/refuses  to.  Reviewed NMES unit and A/E recommendations if pt/family wish to pursue       Exercises   Exercises  Shoulder      Shoulder Exercises: ROM/Strengthening   UBE (Upper Arm Bike)  Attempted UBE with Rt hand wrapped per pt/family request, but d/c after 3 min. due to shoulder pain. Therapist explained that pt did not have enough shoulder activation/musculature to support this activity at this time.       Neurological Re-education Exercises   Other Exercises 1  Supine: chest press with PVC frame and mod to max facilitation RUE for hand placement and triceps/elbow extension. Pt however did have activation at shoulder. Progressed to AA/ROM low ranged using tilted stool with only min facilitation. Family present today to see activities for neuro re-educ to RUE    Other Weight-Bearing Exercises 1  BUE wt bearing for sit to low level squatting position followed by holding position w/ small wt shifts to Rt side, then progressed to disengaging LUE. (Family present for education on how to do this at home if desired)               OT Short Term Goals - 12/02/17 1623      OT SHORT TERM GOAL #1  Title  Pt/family independent with HEP for RUE - 11/27/17    Time  4    Period  Weeks    Status  Achieved Pt has HEP already previously issued (reviewed by outpatient therapist)       OT SHORT TERM GOAL #2   Title  Independent with splint wear and care for Rt hand prn    Time  4    Period  Weeks    Status  Achieved      OT SHORT TERM GOAL #3   Title  Pt/family to verbalize understanding with A/E and task modifications to increase indpendence with ADLS/IADLS (for bathing, tying shoes, cutting food, simple meal prep)    Time  4    Period  Weeks    Status  Achieved      OT SHORT TERM GOAL #4   Title  Pt to perform bathing with only min assist and A/E prn while seated     Time  4    Period  Weeks    Status  On-going      OT SHORT TERM GOAL #5   Title  Pt to make sandwich w/ only supervision/cues  prn    Time  4    Period  Weeks    Status  Achieved however, needs modifications and/or A/E to be independent      OT SHORT TERM GOAL #6   Title  Pt to demo 25 degrees shoulder flexion in prep for low level reaching with gross finger flexion    Time  4    Period  Weeks    Status  On-going      OT SHORT TERM GOAL #7   Title  Pt/family to verbalize understanding with strategies to increase independence and safety with medication management including memory strategies prn     Time  4    Period  Weeks    Status  Deferred        OT Long Term Goals - 12/02/17 1624      OT LONG TERM GOAL #1   Title  Pt to perform shower transfers at mod I level w/ DME prn and no LOB - 12/27/17    Time  8    Period  Weeks    Status  Deferred pt no longer wants to work on this      [place] #2   Title  Pt to perform simple meal prep at sup level using A/E and task modifications prn    Time  8    Period  Weeks    Status  Deferred Pt no longer wants to work on this      [place] #3   Title  Pt to perform light house management tasks at sup level including: laundry, washing dishes, cleaning, making bed    Time  8    Period  Weeks    Status  On-going Pt reports she is already washing dishes      OT LONG TERM GOAL #4   Title  Pt to demo 25% finger extension in prep for releasing objects Rt hand     Time  8    Period  Weeks    Status  On-going      OT LONG TERM GOAL #5   Title  Pt to perform environmental scanning while performing simple physical task at 90% accuracy    Time  8    Period  Weeks  Status  New      OT LONG TERM GOAL #6   Title  Pt to use Rt hand as stabalizer 25% of the time    Time  8    Period  Weeks    Status  New            Plan - 12/09/17 1216    Clinical Impression Statement  Pt demo increased activation of RUE during low range AA/ROM closed chain activities.     Occupational Profile and client history currently impacting functional performance   no significant PMH but current impairments extensive    Occupational performance deficits (Please refer to evaluation for details):  ADL's;IADL's;Work;Leisure;Social Participation    Rehab Potential  Fair    OT Frequency  2x / week    OT Duration  8 weeks    OT Treatment/Interventions  Self-care/ADL training;Moist Heat;DME and/or AE instruction;Splinting;Therapeutic activities;Psychosocial skills training;Aquatic Therapy;Cognitive remediation/compensation;Therapeutic exercise;Coping strategies training;Neuromuscular education;Functional Mobility Training;Passive range of motion;Visual/perceptual remediation/compensation;Electrical Stimulation;Manual Therapy;Patient/family education    Plan  NMR RUE and trunk, estim    Consulted and Agree with Plan of Care  Patient       Patient will benefit from skilled therapeutic intervention in order to improve the following deficits and impairments:  Decreased coordination, Decreased range of motion, Difficulty walking, Improper body mechanics, Decreased endurance, Decreased safety awareness, Impaired sensation, Improper spinal/pelvic alignment, Impaired tone, Decreased knowledge of precautions, Decreased knowledge of use of DME, Decreased balance, Impaired UE functional use, Pain, Decreased cognition, Decreased mobility, Decreased strength, Impaired perceived functional ability, Impaired vision/preception  Visit Diagnosis: Hemiplegia and hemiparesis following cerebral infarction affecting right dominant side (HCC)  Visuospatial deficit  Muscle weakness (generalized)    Problem List Patient Active Problem List   Diagnosis Date Noted  . Fall 09/17/2017  . Hemiparesis of right dominant side as late effect of cerebral infarction ([place]) 09/17/2017  . Shoulder subluxation, right, initial encounter 09/17/2017  . Cardioembolic stroke ([place]) 63/06/6008  . Weakness 09/17/2017  . Aphasia 09/17/2017  . 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 Cardiomyopathy ([place]) 09/17/2017  . H/O ischemic left MCA stroke 09/17/2017  . Cardiac LV ejection fraction 21-30%   . Graves disease   . Plaque psoriasis   . Neuropathy 05/19/2017    Carey Bullocks, OTR/L 12/09/2017, 12:22 PM  [facility] [address] [place], [place], [address] Phone: [phone]   Fax:  [phone]  Name: Rhylan Gross MRN: [id number] Date of Birth: 12/25/1960

## 2017-12-09 NOTE — Therapy (Signed)
[facility] [address] [place], [place], [address] Phone: [phone]   Fax:  [phone]  Physical Therapy Treatment  Patient Details  Name: Margaret Ortiz MRN: [id number] Date of Birth: 12/15/1960 Referring Provider: Howard Pouch, DO   Encounter Date: 12/08/2017  PT End of Session - 12/09/17 0627    Visit Number  10    Number of Visits  17    Date for PT Re-Evaluation  12/27/17    Authorization Type  BCBS     PT Start Time  1320    PT Stop Time  8366    PT Time Calculation (min)  45 min    Equipment Utilized During Treatment  -- min guard to S    Activity Tolerance  Patient tolerated treatment well    Behavior During Therapy  [facility] for tasks assessed/performed       Past Medical History:  Diagnosis Date  . 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  . Cardiac LV ejection fraction 21-30%   . Cardioembolic stroke ([place]) 29/47/6546   Right hemiparesis  . Cardiomyopathy ([place]) 04/2017  . Dysphagia 05/19/2017   Last known diet upgraded to regular diet with thin liquids on 06/27/2017, no records available  . Graves disease   . Hyperlipidemia   . Hypertension   . Neuropathy 05/19/2017   s/p stroke  . Plaque psoriasis     Past Surgical History:  Procedure Laterality Date  . NO PAST SURGERIES      There were no vitals filed for this visit.  Subjective Assessment - 12/08/17 1349    Subjective  No subjective reports (aphasia).    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      Currently in Pain?  No/denies                       [facility] Adult PT Treatment/Exercise - 12/08/17 1356      Ambulation/Gait   Ambulation/Gait  Yes    Ambulation/Gait Assistance  4: Min  guard    Ambulation/Gait Assistance Details  Tactile cues from PT to increase right weight shifting, verbal cues for longer left stride length.    Ambulation Distance (Feet)  230 Feet x 3 reps    Assistive device  Small based quad cane;None;Straight cane    Gait Pattern  Decreased stance time - right;Decreased step length - left;Decreased stride length;Decreased hip/knee flexion - right;Decreased dorsiflexion - right;Decreased weight shift to right;Right circumduction;Right hip hike;Lateral hip instability;Poor foot clearance - right    Ambulation Surface  Level    Gait velocity  0.72 ft/sec    Gait Comments  patient wears R custom AFO.  She performed treadmill x 4 minutes with one rest break (standing) at 0.5 mph with tactile cues by PT to increase R weight shift and encourage R knee flexion to initiate swing phase.   Backwards walking with min A encouraging R knee flexion.      Neuro Re-ed    Neuro Re-ed Details   Standing activities to emphasize R knee and hip control.  Stood with R foot posteriorly and worked on ant/post weight shifting with  hip cues.  Performed R knee mini flexion/extension with resistance.  Placed a pillow case under the left  foot and had patient slide it ant/post and laterally with assist for R quad engagement and upright hip positioning.                PT Short Term Goals - 12/04/17 1521      PT SHORT TERM GOAL #1   Title  The patient will perform HEP with cues from family for LE strength, standing balance, and motor control.    Baseline  Pt reports doing exercises at home - but only 3 exercises have been given to date - 12-02-17    Time  4    Period  Weeks    Status  Partially Met      PT SHORT TERM GOAL #2   Title  The patient will improve gait speed from 0.85 ft/sec to > or equal to 1.4 ft/sec to demonstrate transition from "household ambulator" to "limited community ambulator" classification of gait.    Status  Not Met      PT SHORT TERM GOAL #3    Title  The patient will improve Berg balance score from 32/56 to > or equal to 38/56 to demo dec'ing risk for falls.    Baseline  39/56 on 12/04/17    Time  4    Period  Weeks    Status  Achieved      PT SHORT TERM GOAL #4   Title  The patient will negotiate 4 steps with one rail and step to pattern mod indep.    Baseline  met 12-02-17    Status  Achieved      PT SHORT TERM GOAL #5   Title  The patient will ambulate x 500 ft nonstop mod indep for improving access to community surfaces.    Baseline  1 seated rest period needed after (480-56)    Status  Partially Met        PT Long Term Goals - 10/28/17 0740      PT LONG TERM GOAL #1   Title  The patient will perform HEP with intermittent assist for post d/c progression of exercise.    Time  8    Period  Weeks    Target Date  12/27/17      PT LONG TERM GOAL #2   Title  The patient will move floor<>stand with mod indep using L UE support on surface due to h/o falls.    Time  8    Period  Weeks    Target Date  12/27/17      PT LONG TERM GOAL #3   Title  The patient will improve gait speed from 0.85 ft/sec to > or equal to 1.8 ft/sec to demo dec'ing risk for falls.    Time  8    Period  Weeks    Target Date  12/27/17      PT LONG TERM GOAL #4   Title  The patient will improve Berg score from 32/56 to > or equal to 44/56 to demo dec'ing risk for falls.    Time  8    Period  Weeks    Target Date  12/27/17      PT LONG TERM GOAL #5   Title  The patient will negotiate 12 steps without a handrail with step to pattern mod indep to access home entries.    Time  8    Period  Weeks    Target Date  12/27/17  Plan - 12/09/17 5102    Clinical Impression Statement  PT emphasized increasing right weight shift and gait speed today.  PT to continue to work on R motor control in order to gain increased functional mobility.    PT Treatment/Interventions  ADLs/Self Care Home Management;Gait training;Stair  training;Functional mobility training;Therapeutic activities;Therapeutic exercise;Balance training;Neuromuscular re-education;Patient/family education;Orthotic Fit/Training;Electrical Stimulation;Manual techniques;DME Instruction    PT Next Visit Plan  Continue with Bioness (tablet 1) for both lower and upper unit RLE, R side strengthening, hip initiation during gait tasks, right LE loading/weight shifting.  gait training (in clinic without SBQC to encourage more reciprocal pattern),     Consulted and Agree with Plan of Care  Patient       Patient will benefit from skilled therapeutic intervention in order to improve the following deficits and impairments:  Abnormal gait, Impaired sensation, Pain, Postural dysfunction, Impaired tone, Decreased mobility, Decreased coordination, Decreased activity tolerance, Decreased endurance, Decreased strength, Difficulty walking, Decreased balance  Visit Diagnosis: Unsteadiness on feet  Muscle weakness (generalized)  Other symptoms and signs involving the nervous system  Other abnormalities of gait and mobility     Problem List Patient Active Problem List   Diagnosis Date Noted  . Fall 09/17/2017  . Hemiparesis of right dominant side as late effect of cerebral infarction ([place]) 09/17/2017  . Shoulder subluxation, right, initial encounter 09/17/2017  . Cardioembolic stroke ([place]) 58/52/7782  . Weakness 09/17/2017  . Aphasia 09/17/2017  . 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 Cardiomyopathy ([place]) 09/17/2017  . H/O ischemic left MCA stroke 09/17/2017  . Cardiac LV ejection fraction 21-30%   . Graves disease   . Plaque psoriasis   . Neuropathy 05/19/2017    WEAVER,CHRISTINA, PT 12/09/2017, 6:37 AM  [facility] [address] [place], [place], [address] Phone: [phone]   Fax:  [phone]  Name: Margaret Ortiz MRN: [id number] Date of Birth: 01/04/1961

## 2017-12-11 ENCOUNTER — Ambulatory Visit: Payer: BLUE CROSS/BLUE SHIELD | Admitting: Occupational Therapy

## 2017-12-11 ENCOUNTER — Encounter: Payer: Self-pay | Admitting: Rehabilitation

## 2017-12-11 ENCOUNTER — Ambulatory Visit: Payer: BLUE CROSS/BLUE SHIELD | Admitting: Rehabilitation

## 2017-12-11 ENCOUNTER — Ambulatory Visit: Payer: BLUE CROSS/BLUE SHIELD | Admitting: Speech Pathology

## 2017-12-11 DIAGNOSIS — R4701 Aphasia: Secondary | ICD-10-CM

## 2017-12-11 DIAGNOSIS — R29818 Other symptoms and signs involving the nervous system: Secondary | ICD-10-CM

## 2017-12-11 DIAGNOSIS — R2681 Unsteadiness on feet: Secondary | ICD-10-CM

## 2017-12-11 DIAGNOSIS — I69351 Hemiplegia and hemiparesis following cerebral infarction affecting right dominant side: Secondary | ICD-10-CM

## 2017-12-11 DIAGNOSIS — R2689 Other abnormalities of gait and mobility: Secondary | ICD-10-CM

## 2017-12-11 DIAGNOSIS — M6281 Muscle weakness (generalized): Secondary | ICD-10-CM

## 2017-12-11 NOTE — Therapy (Signed)
[facility] [address] [place], [place], [address] Phone: [phone]   Fax:  [phone]  Occupational Therapy Treatment  Patient Details  Name: Margaret Ortiz MRN: [id number] Date of Birth: 23-Dec-1960 Referring Provider: Howard Pouch, DO   Encounter Date: 12/11/2017  OT End of Session - 12/11/17 1216    Visit Number  14    Number of Visits  24    Date for OT Re-Evaluation  12/27/17    Authorization Type  BC/BS    OT Start Time  0930    OT Stop Time  1015    OT Time Calculation (min)  45 min    Activity Tolerance  Patient tolerated treatment well    Behavior During Therapy  [facility] for tasks assessed/performed       Past Medical History:  Diagnosis Date  . 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  . Cardiac LV ejection fraction 21-30%   . Cardioembolic stroke ([place]) 02/58/5277   Right hemiparesis  . Cardiomyopathy ([place]) 04/2017  . Dysphagia 05/19/2017   Last known diet upgraded to regular diet with thin liquids on 06/27/2017, no records available  . Graves disease   . Hyperlipidemia   . Hypertension   . Neuropathy 05/19/2017   s/p stroke  . Plaque psoriasis     Past Surgical History:  Procedure Laterality Date  . NO PAST SURGERIES      There were no vitals filed for this visit.  Subjective Assessment - 12/11/17 1207    Subjective   Pt now agreeable to working on ADLS/IADLS    Patient is accompained by:  Family member brother-n-law    Pertinent History  Lt MCA CVA 05/19/2017, Graves dz    Currently in Pain?  No/denies                   OT Treatments/Exercises (OP) - 12/11/17 0001      ADLs   ADL Comments  Discussion with patient and brother-n-law (while pt simultaneously on estim) re: POC and anticipated upcoming renewal at beginning of July. Pt now agreeable to working on ADLS/IADLS as part of the [place]  as long as we also work on [employer]. Therapist explained that we always planned on working her arm, but that we would probably d/c earlier if pt refused to work on [employer] also. Pt now fully agrees to ADL goals that had been deferred (d/t previous refusal to work on them). Discussed renewal in July but also gently explained reasoning for d/c when the time comes. Pt/family understood      Neurological Re-education Exercises   Other Exercises 1  Continued low range AA/ROM (closed chain) using tilted stool - however focused on mid ranges to prevent body compensations and momentum. Also worked in low level scaption ranges (to prevent IR) using towel along xfer board    Other Exercises 2  body on arm closed chain activity w/ focus on maintaining entire RUE activation      Electrical Stimulation   Electrical Stimulation Location  dorsal forearm    Electrical Stimulation Action  wrist and finger ext    Electrical Stimulation Parameters  same previous parameters x 15 minutes    Electrical Stimulation Goals  Neuromuscular facilitation               OT Short Term Goals - 12/02/17 1623      OT SHORT TERM GOAL #  1   Title  Pt/family independent with HEP for RUE - 11/27/17    Time  4    Period  Weeks    Status  Achieved Pt has HEP already previously issued (reviewed by outpatient therapist)       OT SHORT TERM GOAL #2   Title  Independent with splint wear and care for Rt hand prn    Time  4    Period  Weeks    Status  Achieved      OT SHORT TERM GOAL #3   Title  Pt/family to verbalize understanding with A/E and task modifications to increase indpendence with ADLS/IADLS (for bathing, tying shoes, cutting food, simple meal prep)    Time  4    Period  Weeks    Status  Achieved      OT SHORT TERM GOAL #4   Title  Pt to perform bathing with only min assist and A/E prn while seated     Time  4    Period  Weeks    Status  On-going      OT SHORT TERM GOAL #5   Title  Pt to make sandwich w/ only  supervision/cues prn    Time  4    Period  Weeks    Status  Achieved however, needs modifications and/or A/E to be independent      OT SHORT TERM GOAL #6   Title  Pt to demo 25 degrees shoulder flexion in prep for low level reaching with gross finger flexion    Time  4    Period  Weeks    Status  On-going      OT SHORT TERM GOAL #7   Title  Pt/family to verbalize understanding with strategies to increase independence and safety with medication management including memory strategies prn     Time  4    Period  Weeks    Status  Deferred        OT Long Term Goals - 12/11/17 1217      OT LONG TERM GOAL #1   Title  Pt to perform shower transfers at mod I level w/ DME prn and no LOB - 12/27/17    Time  8    Period  Weeks    Status  On-going pt now agrees to work on this again      OT [place] #2   Title  Pt to perform simple meal prep at sup level using A/E and task modifications prn    Time  8    Period  Weeks    Status  On-going Pt now agrees to work on this again      OT [place] #3   Title  Pt to perform light house management tasks at sup level including: laundry, washing dishes, cleaning, making bed    Time  8    Period  Weeks    Status  On-going Pt reports she is already washing dishes      OT LONG TERM GOAL #4   Title  Pt to demo 25% finger extension in prep for releasing objects Rt hand     Time  8    Period  Weeks    Status  On-going      OT LONG TERM GOAL #5   Title  Pt to perform environmental scanning while performing simple physical task at 90% accuracy    Time  8    Period  Weeks    Status  New      Long Term Additional Goals   Additional Long Term Goals  Yes      OT LONG TERM GOAL #6   Title  Pt to use Rt hand as stabalizer 25% of the time    Time  8    Period  Weeks    Status  New      OT LONG TERM GOAL #7   Title  Pt/family to verbalize understanding with neccessary accommodations, A/E, etc that would be needed if pt is to return to  living alone    Time  8    Period  Weeks    Status  New            Plan - 12/11/17 1219    Clinical Impression Statement  Pt now agrees to incorporate ADLS into POC and therapist adjusted goals again to reflect this (no longer deferred). Pt demo increased activation of RUE during low range closed chain activities.     Occupational Profile and client history currently impacting functional performance  no significant PMH but current impairments extensive    Occupational performance deficits (Please refer to evaluation for details):  ADL's;IADL's;Work;Leisure;Social Participation    Rehab Potential  Fair    Current Impairments/barriers affecting progress:  severity of deficits    OT Frequency  2x / week    OT Duration  8 weeks    OT Treatment/Interventions  Self-care/ADL training;Moist Heat;DME and/or AE instruction;Splinting;Therapeutic activities;Psychosocial skills training;Aquatic Therapy;Cognitive remediation/compensation;Therapeutic exercise;Coping strategies training;Neuromuscular education;Functional Mobility Training;Passive range of motion;Visual/perceptual remediation/compensation;Electrical Stimulation;Manual Therapy;Patient/family education    Plan  Continue NMR RUE, estim, try prone on elbows again if able, try tall kneeling with arms on ball    Consulted and Agree with Plan of Care  Patient    Family Member Consulted  brother-n-law       Patient will benefit from skilled therapeutic intervention in order to improve the following deficits and impairments:  Decreased coordination, Decreased range of motion, Difficulty walking, Improper body mechanics, Decreased endurance, Decreased safety awareness, Impaired sensation, Improper spinal/pelvic alignment, Impaired tone, Decreased knowledge of precautions, Decreased knowledge of use of DME, Decreased balance, Impaired UE functional use, Pain, Decreased cognition, Decreased mobility, Decreased strength, Impaired perceived functional  ability, Impaired vision/preception  Visit Diagnosis: Hemiplegia and hemiparesis following cerebral infarction affecting right dominant side (HCC)  Other symptoms and signs involving the nervous system    Problem List Patient Active Problem List   Diagnosis Date Noted  . Fall 09/17/2017  . 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 Shoulder subluxation, right, initial encounter 09/17/2017  . Cardioembolic stroke ([place]) 59/16/3846  . Weakness 09/17/2017  . Aphasia 09/17/2017  . 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 Cardiomyopathy ([place]) 09/17/2017  . H/O ischemic left MCA stroke 09/17/2017  . Cardiac LV ejection fraction 21-30%   . Graves disease   . Plaque psoriasis   . Neuropathy 05/19/2017    Carey Bullocks, OTR/L 12/11/2017, 12:23 PM  [facility] [address] [place], [place], [address] Phone: [phone]   Fax:  [phone]  Name: Daveah Varone MRN: [id number] Date of Birth: 1961-03-16

## 2017-12-11 NOTE — Therapy (Signed)
[facility] [address] [place], [place], [address] Phone: [phone]   Fax:  [phone]  Speech Language Pathology Treatment  Patient Details  Name: Margaret Ortiz MRN: [id number] Date of Birth: March 22, 1961 Referring Provider: Ma Hillock, DO   Encounter Date: 12/11/2017  End of Session - 12/11/17 1735    Visit Number  12    Number of Visits  17    Date for SLP Re-Evaluation  01/09/18    Authorization Type  BCBS    SLP Start Time  1618    SLP Stop Time   1702    SLP Time Calculation (min)  44 min    Activity Tolerance  Patient tolerated treatment well       Past Medical History:  Diagnosis Date  . 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  . Cardiac LV ejection fraction 21-30%   . Cardioembolic stroke ([place]) 26/37/8588   Right hemiparesis  . Cardiomyopathy ([place]) 04/2017  . Dysphagia 05/19/2017   Last known diet upgraded to regular diet with thin liquids on 06/27/2017, no records available  . Graves disease   . Hyperlipidemia   . Hypertension   . Neuropathy 05/19/2017   s/p stroke  . Plaque psoriasis     Past Surgical History:  Procedure Laterality Date  . NO PAST SURGERIES      There were no vitals filed for this visit.  Subjective Assessment - 12/11/17 1619    Subjective  "Maybe you can show me." pt brings phone with communication apps installed    Patient is accompained by:  Family member Richardson Landry    Currently in Pain?  No/denies            ADULT SLP TREATMENT - 12/11/17 1618      General Information   Behavior/Cognition  Alert;Cooperative;Pleasant mood      Treatment Provided   Treatment provided  Cognitive-Linquistic      Pain Assessment   Pain Assessment  No/denies pain      Cognitive-Linquistic Treatment   Treatment focused on  Aphasia    Skilled Treatment  SLP facilitated simple  conversation re: scheduling and plan for upcoming appointments for ST, with compensations for aphasia. Pt demo'd understanding. Pt showed SLP applications on her phone, reported having difficulty with [employer] trial. Pt unable to recall password for her account. SLP selected appropriate activities for auditory commands. Pt followed single step auditory commands with 88% accuracy. 2 step, multicomponent commands 60% accuracy; several repetitions required. Cues to reduce impulsivity. 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 SLP demo'd [employer] touchtalk for personalized verbal expression. 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 SLP demo'd creating icons; pt able to assist with typing for internet search (aphasic errors requiring mod cues). Pt agreed she would like to try using a communication device.       Assessment / Recommendations / Plan   Plan  Continue with current plan of care      Progression Toward Goals   Progression toward goals  Progressing toward goals       SLP Education - 12/11/17 1735    Education provided  Yes    Education Details  L[employer] touchtalk device trial    Person(s) Educated  Patient    Methods  Explanation;Demonstration    Comprehension  Verbalized understanding;Need further instruction  SLP Short Term Goals - 12/11/17 1727      SLP SHORT TERM GOAL #1   Title  Pt will complete simple naming tasks with error awareness 80% accuracy with occasional min A (compensations allowed)    Status  Achieved      SLP SHORT TERM GOAL #2   Title  Pt will participate functionally in 5 minutes simple conversation with conversational supports for aphasia over 3 sessions.     Status  Not Met      SLP SHORT TERM GOAL #3   Title  Pt will comprehend 3-step/3 component verbal directions with occasional min A 80% accuracy    Status  Not Met      SLP SHORT TERM GOAL #4   Title  Pt will  undergo further assessment of writing, reading comprehension.    Status  Partially Met      SLP SHORT TERM GOAL #5   Title  Pt will demo understanding of 8 minutes of simple conversation over 3 sessions with compensations for comprehension    Status  Partially Met       SLP Long Term Goals - 12/11/17 1727      SLP LONG TERM GOAL #1   Title  Pt will participate functionally in 10 minutes simple-mod complex conversation with conversational supports for aphasia over 3 sessions.    Time  3    Period  Weeks    Status  On-going      SLP LONG TERM GOAL #2   Title  Pt will demo understanding of 10 minutes simple-mod complex conversation with compensations over 3 sessions    Time  3    Period  Weeks    Status  On-going      SLP LONG TERM GOAL #3   Title  Pt will demo error awareness by attempting correction or by nonverbal response to errors with 90% success    Time  3    Period  Weeks    Status  On-going       Plan - 12/11/17 1736    Clinical Impression Statement  Pt continues to present with 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 Fluidity of simple conversation is improving; patient's awareness of errors deficient. Written keywords and drawings helpful for auditory comprehension. Paraphasias and neologisms persist affecting simple conversation, and aphasic written language has been observed today. Continue skilled ST to maximize functional communication for independence and QOL. SLP initiated request for [employer] TouchTalk communication device trial today.    Speech Therapy Frequency  2x / week    Treatment/Interventions  Multimodal communcation approach;Compensatory strategies;Language facilitation;Compensatory techniques;Cueing hierarchy;Internal/external aids;Functional tasks;SLP instruction and feedback;Patient/family education    Potential to Achieve Goals  Good    Potential Considerations  Severity of impairments    SLP Home Exercise Plan  provided     Consulted and Agree with Plan of Care  Patient       Patient will benefit from skilled therapeutic intervention in order to improve the following deficits and impairments:   Aphasia    Problem List Patient Active Problem List   Diagnosis Date Noted  . Fall 09/17/2017  . Hemiparesis of right dominant side as late effect of cerebral infarction ([place]) 09/17/2017  . Shoulder subluxation, right, initial encounter 09/17/2017  . Cardioembolic stroke ([place]) 67/61/9509  . Weakness 09/17/2017  . Aphasia 09/17/2017  . 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 Cardiomyopathy ([place]) 09/17/2017  . H/O ischemic left MCA stroke 09/17/2017  .  Cardiac LV ejection fraction 21-30%   . Graves disease   . Plaque psoriasis   . Neuropathy 05/19/2017   Deneise Lever, [place], [place] 12/11/2017, 5:37 PM  [facility] [address] [place], [place], [address] Phone: [phone]   Fax:  [phone]   Name: Margaret Ortiz MRN: [id number] Date of Birth: Nov 27, 1960

## 2017-12-11 NOTE — Therapy (Signed)
[facility] [address] [place], [place], [address] Phone: [phone]   Fax:  [phone]  Physical Therapy Treatment  Patient Details  Name: Margaret Ortiz MRN: [id number] Date of Birth: 1961/03/21 Referring Provider: Howard Pouch, DO   Encounter Date: 12/11/2017  PT End of Session - 12/11/17 1252    Visit Number  11    Number of Visits  17    Date for PT Re-Evaluation  12/27/17    Authorization Type  BCBS     PT Start Time  1019    PT Stop Time  1102    PT Time Calculation (min)  43 min    Equipment Utilized During Treatment  -- min guard to S    Activity Tolerance  Patient tolerated treatment well    Behavior During Therapy  [facility] for tasks assessed/performed       Past Medical History:  Diagnosis Date  . 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  . Cardiac LV ejection fraction 21-30%   . Cardioembolic stroke ([place]) 53/97/6734   Right hemiparesis  . Cardiomyopathy ([place]) 04/2017  . Dysphagia 05/19/2017   Last known diet upgraded to regular diet with thin liquids on 06/27/2017, no records available  . Graves disease   . Hyperlipidemia   . Hypertension   . Neuropathy 05/19/2017   s/p stroke  . Plaque psoriasis     Past Surgical History:  Procedure Laterality Date  . NO PAST SURGERIES      There were no vitals filed for this visit.  Subjective Assessment - 12/11/17 1237    Subjective  Pt reports she would like to continue to try different AFOs this session rather than doing Bioness.      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      Currently in Pain?  No/denies                       [facility] Adult PT  Treatment/Exercise - 12/11/17 1030      Ambulation/Gait   Ambulation/Gait  Yes    Ambulation/Gait Assistance  4: Min assist for facilitation    Ambulation/Gait Assistance Details  Session focused on trial of varying AFOs as she does not like her current AFO and it is very bulky per pt report.  She still had elastic shoe laces, however PT was able to take standard laces from practice shoe to better understand how each brace would best assist.  First trialed R walk on PLS AFO with use of heel wedge x 115' with SBQC.  Provided intermittent min A for more forward translation over RLE during stance.  R knee controlled well as well as good clearance however due to AFO strut running medially this tended to cause R LE external rotation and some pronation.   Then tried reaction AFO (ottobock) x 115' with SPC this time for decreased UE support and again due to medial strut, note more ER and pronation.  Pt also keeps RLE abducted away from body.  Then tried use of R allard AFO as this runs laterally.  This provided good support and improved foot alignment during gait.  Pt liked first PLS the most as  she felt it was more flexible.  Certainly it is.  PT would like to find some sort of PLS that runs laterally to better assist with foot alignment.  PT will call [facility] and setup appt for one of her next sessions.  Pt and brother in law verbalized understanding.  Note that during all bouts of gait (with SPC as well), PT providing tactile cues at pelvis and trunk for improved posture and forward movement over RLE during stance.  Intermittent assist for improved RLE placement esp in medially running braces.      Ambulation Distance (Feet)  115 Feet x 3 reps (first with SBQC, then [facility])    Assistive device  Straight cane;Small based quad cane    Gait Pattern  Decreased stance time - right;Decreased step length - left;Decreased stride length;Decreased hip/knee flexion - right;Decreased dorsiflexion - right;Decreased weight shift  to right;Right circumduction;Right hip hike;Lateral hip instability;Poor foot clearance - right    Ambulation Surface  Level;Indoor      Self-Care   Self-Care  Other Self-Care Comments    Other Self-Care Comments   Discussed uses and benefits of each type of brace trialed during session.              PT Education - 12/11/17 1251    Education provided  Yes    Education Details  see self care    Person(s) Educated  Patient;Other (comment) brother in law    Methods  Explanation;Demonstration    Comprehension  Verbalized understanding       PT Short Term Goals - 12/04/17 1521      PT SHORT TERM GOAL #1   Title  The patient will perform HEP with cues from family for LE strength, standing balance, and motor control.    Baseline  Pt reports doing exercises at home - but only 3 exercises have been given to date - 12-02-17    Time  4    Period  Weeks    Status  Partially Met      PT SHORT TERM GOAL #2   Title  The patient will improve gait speed from 0.85 ft/sec to > or equal to 1.4 ft/sec to demonstrate transition from "household ambulator" to "limited community ambulator" classification of gait.    Status  Not Met      PT SHORT TERM GOAL #3   Title  The patient will improve Berg balance score from 32/56 to > or equal to 38/56 to demo dec'ing risk for falls.    Baseline  39/56 on 12/04/17    Time  4    Period  Weeks    Status  Achieved      PT SHORT TERM GOAL #4   Title  The patient will negotiate 4 steps with one rail and step to pattern mod indep.    Baseline  met 12-02-17    Status  Achieved      PT SHORT TERM GOAL #5   Title  The patient will ambulate x 500 ft nonstop mod indep for improving access to community surfaces.    Baseline  1 seated rest period needed after (480-56)    Status  Partially Met        PT Long Term Goals - 10/28/17 0740      PT LONG TERM GOAL #1   Title  The patient will perform HEP with intermittent assist for post d/c progression of  exercise.    Time  8    Period  Weeks    Target Date  12/27/17      PT LONG TERM GOAL #2   Title  The patient will move floor<>stand with mod indep using L UE support on surface due to h/o falls.    Time  8    Period  Weeks    Target Date  12/27/17      PT LONG TERM GOAL #3   Title  The patient will improve gait speed from 0.85 ft/sec to > or equal to 1.8 ft/sec to demo dec'ing risk for falls.    Time  8    Period  Weeks    Target Date  12/27/17      PT LONG TERM GOAL #4   Title  The patient will improve Berg score from 32/56 to > or equal to 44/56 to demo dec'ing risk for falls.    Time  8    Period  Weeks    Target Date  12/27/17      PT LONG TERM GOAL #5   Title  The patient will negotiate 12 steps without a handrail with step to pattern mod indep to access home entries.    Time  8    Period  Weeks    Target Date  12/27/17            Plan - 12/11/17 1252    Clinical Impression Statement  Skilled session focused on trialing varing R AFOs that would provide best support and foot clearance and be less cumbersome than current brace.  Feel that she could benefit from PLS brace that runs purely posteriorly or laterally.  Will schedule appt with [facility] to come to session here.  Pt and brother in law verbalized understanding.     PT Treatment/Interventions  ADLs/Self Care Home Management;Gait training;Stair training;Functional mobility training;Therapeutic activities;Therapeutic exercise;Balance training;Neuromuscular re-education;Patient/family education;Orthotic Fit/Training;Electrical Stimulation;Manual techniques;DME Instruction    PT Next Visit Plan  Session with Gerald Stabs when available, Continue with Bioness (tablet 1) for both lower and upper unit RLE, R side strengthening, hip initiation during gait tasks, right LE loading/weight shifting.  gait training (in clinic without SBQC to encourage more reciprocal pattern),     Consulted and Agree with Plan of Care  Patient        Patient will benefit from skilled therapeutic intervention in order to improve the following deficits and impairments:  Abnormal gait, Impaired sensation, Pain, Postural dysfunction, Impaired tone, Decreased mobility, Decreased coordination, Decreased activity tolerance, Decreased endurance, Decreased strength, Difficulty walking, Decreased balance  Visit Diagnosis: Hemiplegia and hemiparesis following cerebral infarction affecting right dominant side (HCC)  Unsteadiness on feet  Other abnormalities of gait and mobility  Muscle weakness (generalized)     Problem List Patient Active Problem List   Diagnosis Date Noted  . Fall 09/17/2017  . Hemiparesis of right dominant side as late effect of cerebral infarction ([place]) 09/17/2017  . Shoulder subluxation, right, initial encounter 09/17/2017  . Cardioembolic stroke ([place]) 04/09/5101  . Weakness 09/17/2017  . Aphasia 09/17/2017  . 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 Cardiomyopathy ([place]) 09/17/2017  . H/O ischemic left MCA stroke 09/17/2017  . Cardiac LV ejection fraction 21-30%   . Graves disease   . Plaque psoriasis   . Neuropathy 05/19/2017    Cameron Sprang, PT, MPT [facility] [address] [place], [place], [address] Phone: [phone]   Fax:  [phone] 12/11/17, 12:55 PM  Name: Margaret Ortiz MRN: [id number] Date of  Birth: 1961/06/20

## 2017-12-15 ENCOUNTER — Ambulatory Visit: Payer: BLUE CROSS/BLUE SHIELD | Admitting: Occupational Therapy

## 2017-12-15 ENCOUNTER — Ambulatory Visit: Payer: BLUE CROSS/BLUE SHIELD | Admitting: Speech Pathology

## 2017-12-15 ENCOUNTER — Ambulatory Visit: Payer: BLUE CROSS/BLUE SHIELD | Admitting: Rehabilitative and Restorative Service Providers"

## 2017-12-15 ENCOUNTER — Encounter: Payer: Self-pay | Admitting: Rehabilitative and Restorative Service Providers"

## 2017-12-15 DIAGNOSIS — I69351 Hemiplegia and hemiparesis following cerebral infarction affecting right dominant side: Secondary | ICD-10-CM

## 2017-12-15 DIAGNOSIS — R2689 Other abnormalities of gait and mobility: Secondary | ICD-10-CM

## 2017-12-15 DIAGNOSIS — R4701 Aphasia: Secondary | ICD-10-CM

## 2017-12-15 DIAGNOSIS — M6281 Muscle weakness (generalized): Secondary | ICD-10-CM

## 2017-12-15 DIAGNOSIS — R29818 Other symptoms and signs involving the nervous system: Secondary | ICD-10-CM

## 2017-12-15 DIAGNOSIS — G8929 Other chronic pain: Secondary | ICD-10-CM

## 2017-12-15 DIAGNOSIS — M25511 Pain in right shoulder: Secondary | ICD-10-CM

## 2017-12-15 DIAGNOSIS — R2681 Unsteadiness on feet: Secondary | ICD-10-CM

## 2017-12-15 NOTE — Therapy (Signed)
[facility] [address] [place], [place], [address] Phone: [phone]   Fax:  [phone]  Speech Language Pathology Treatment  Patient Details  Name: Margaret Ortiz MRN: [id number] Date of Birth: Oct 26, 1960 Referring Provider: Ma Hillock, DO   Encounter Date: 12/15/2017  End of Session - 12/15/17 1758    Visit Number  13    Number of Visits  17    Date for SLP Re-Evaluation  01/09/18    SLP Start Time  1532    SLP Stop Time   4970    SLP Time Calculation (min)  43 min    Activity Tolerance  Patient tolerated treatment well       Past Medical History:  Diagnosis Date  . 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  . Cardiac LV ejection fraction 21-30%   . Cardioembolic stroke ([place]) 26/37/8588   Right hemiparesis  . Cardiomyopathy ([place]) 04/2017  . Dysphagia 05/19/2017   Last known diet upgraded to regular diet with thin liquids on 06/27/2017, no records available  . Graves disease   . Hyperlipidemia   . Hypertension   . Neuropathy 05/19/2017   s/p stroke  . Plaque psoriasis     Past Surgical History:  Procedure Laterality Date  . NO PAST SURGERIES      There were no vitals filed for this visit.  Subjective Assessment - 12/15/17 1537    Subjective  "It's cold, I mean it's hot out-jide."    Currently in Pain?  No/denies            ADULT SLP TREATMENT - 12/15/17 1534      General Information   Behavior/Cognition  Alert;Cooperative;Pleasant mood    Patient Positioning  Upright in chair      Treatment Provided   Treatment provided  Cognitive-Linquistic      Pain Assessment   Pain Assessment  No/denies pain      Cognitive-Linquistic Treatment   Treatment focused on  Aphasia    Skilled Treatment  Targeted verbal expression and auditory comprehension in simple-mod complex conversation today with  multimodal supports (written keywords, visual aids, drawings and pictures). Occasional cues required for error awareness (pt rephrases or reattempts approximately 70% of opportunities and question clues required for clarification.       Assessment / Recommendations / Plan   Plan  Continue with current plan of care      Progression Toward Goals   Progression toward goals  Progressing toward goals         SLP Short Term Goals - 12/15/17 1759      SLP SHORT TERM GOAL #1   Title  Pt will complete simple naming tasks with error awareness 80% accuracy with occasional min A (compensations allowed)    Status  Achieved      SLP SHORT TERM GOAL #2   Status  Not Met      SLP SHORT TERM GOAL #3   Title  Pt will comprehend 3-step/3 component verbal directions with occasional min A 80% accuracy    Status  Not Met      SLP SHORT TERM GOAL #4   Title  Pt will undergo further assessment of writing, reading comprehension.    Status  Partially Met      SLP SHORT TERM GOAL #5   Title  Pt will demo understanding of 8 minutes of simple conversation over 3  sessions with compensations for comprehension    Status  Partially Met       SLP Long Term Goals - 12/15/17 1759      SLP LONG TERM GOAL #1   Title  Pt will participate functionally in 10 minutes simple-mod complex conversation with conversational supports for aphasia over 3 sessions.    Baseline  12/15/17    Time  2    Period  Weeks    Status  On-going      SLP LONG TERM GOAL #2   Title  Pt will demo understanding of 10 minutes simple-mod complex conversation with compensations over 3 sessions    Time  2    Period  Weeks    Status  On-going      SLP LONG TERM GOAL #3   Title  Pt will demo error awareness by attempting correction or by nonverbal response to errors with 90% success    Period  Weeks    Status  On-going       Plan - 12/15/17 1759    Clinical Impression Statement  Pt continues to present with moderate fluent aphasia  affecting comprehension and expression. Fluidity of simple conversation is improving; patient's awareness of errors deficient. Written keywords and drawings helpful for auditory comprehension. Paraphasias and neologisms persist affecting simple conversation, and aphasic written language has been observed today. Continue skilled ST to maximize functional communication for independence and QOL.     Speech Therapy Frequency  2x / week    Treatment/Interventions  Multimodal communcation approach;Compensatory strategies;Language facilitation;Compensatory techniques;Cueing hierarchy;Internal/external aids;Functional tasks;SLP instruction and feedback;Patient/family education    Potential to Achieve Goals  Good    Potential Considerations  Severity of impairments    SLP Home Exercise Plan  provided    Consulted and Agree with Plan of Care  Patient       Patient will benefit from skilled therapeutic intervention in order to improve the following deficits and impairments:   Aphasia    Problem List Patient Active Problem List   Diagnosis Date Noted  . Fall 09/17/2017  . Hemiparesis of right dominant side as late effect of cerebral infarction ([place]) 09/17/2017  . Shoulder subluxation, right, initial encounter 09/17/2017  . Cardioembolic stroke ([place]) 65/78/4696  . Weakness 09/17/2017  . Aphasia 09/17/2017  . 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 Cardiomyopathy ([place]) 09/17/2017  . H/O ischemic left MCA stroke 09/17/2017  . Cardiac LV ejection fraction 21-30%   . Graves disease   . Plaque psoriasis   . Neuropathy 05/19/2017    Deneise Lever, [place], [place] 12/15/2017, 6:03 PM  [facility] [address] [place], [place], [address] Phone: [phone]   Fax:  [phone]   Name: Margaret Ortiz MRN: [id number] Date of Birth: [birth year]

## 2017-12-15 NOTE — Therapy (Signed)
[facility] [address] [place], [place], [address] Phone: [phone]   Fax:  [phone]  Occupational Therapy Treatment  Patient Details  Name: Margaret Ortiz MRN: [id number] Date of Birth: 1961/05/15 Referring Provider: Howard Pouch, DO   Encounter Date: 12/15/2017  OT End of Session - 12/15/17 1222    Visit Number  15    Number of Visits  24    Date for OT Re-Evaluation  12/27/17    Authorization Type  BC/BS    OT Start Time  1103    OT Stop Time  1148    OT Time Calculation (min)  45 min    Activity Tolerance  Patient tolerated treatment well       Past Medical History:  Diagnosis Date  . 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  . Cardiac LV ejection fraction 21-30%   . Cardioembolic stroke ([place]) 65/08/5463   Right hemiparesis  . Cardiomyopathy ([place]) 04/2017  . Dysphagia 05/19/2017   Last known diet upgraded to regular diet with thin liquids on 06/27/2017, no records available  . Graves disease   . Hyperlipidemia   . Hypertension   . Neuropathy 05/19/2017   s/p stroke  . Plaque psoriasis     Past Surgical History:  Procedure Laterality Date  . NO PAST SURGERIES      There were no vitals filed for this visit.  Subjective Assessment - 12/15/17 1102    Pertinent History  Lt MCA CVA 05/19/2017, Graves dz    Currently in Pain?  No/denies                   OT Treatments/Exercises (OP) - 12/15/17 0001      Neurological Re-education Exercises   Other Exercises 1  Supine: chest press motion using PVC frame with max facilitation RUE for shoulder, elbow and hand.     Other Exercises 2  body on arm movements to maintain RUE against therapist shoulder at approx 80* sh. flexion with mod assist    Other Weight-Bearing Exercises 1  BUE wt bearing for sit to low level squatting position followed by holding  position w/ small wt shifts to Rt side, then progressed to disengaging LUE. (Family present for education on how to do this at home if desired)    Other Weight-Bearing Exercises 2  Attempted wt bearing prone on elbows w/ max support Rt shoulder - pt unable to tolerate very long d/t shoulder pain. Then attempted to transition to tall kneeling but unable. Wt bearing over Rt elbow (on elevated surface) while performing reaching activity with LUE. Followed by lateral trunk flexion wt bearing over RUE w/ ipsilateral high level reaching LUE       Pt given contact info to make referral to local physiatrist.         OT Short Term Goals - 12/02/17 1623      OT SHORT TERM GOAL #1   Title  Pt/family independent with HEP for RUE - 11/27/17    Time  4    Period  Weeks    Status  Achieved Pt has HEP already previously issued (reviewed by outpatient therapist)       OT SHORT TERM GOAL #2   Title  Independent with splint wear and care for Rt hand prn    Time  4    Period  Weeks    Status  Achieved  OT SHORT TERM GOAL #3   Title  Pt/family to verbalize understanding with A/E and task modifications to increase indpendence with ADLS/IADLS (for bathing, tying shoes, cutting food, simple meal prep)    Time  4    Period  Weeks    Status  Achieved      OT SHORT TERM GOAL #4   Title  Pt to perform bathing with only min assist and A/E prn while seated     Time  4    Period  Weeks    Status  On-going      OT SHORT TERM GOAL #5   Title  Pt to make sandwich w/ only supervision/cues prn    Time  4    Period  Weeks    Status  Achieved however, needs modifications and/or A/E to be independent      OT SHORT TERM GOAL #6   Title  Pt to demo 25 degrees shoulder flexion in prep for low level reaching with gross finger flexion    Time  4    Period  Weeks    Status  On-going      OT SHORT TERM GOAL #7   Title  Pt/family to verbalize understanding with strategies to increase independence and safety  with medication management including memory strategies prn     Time  4    Period  Weeks    Status  Deferred        OT Long Term Goals - 12/11/17 1217      OT LONG TERM GOAL #1   Title  Pt to perform shower transfers at mod I level w/ DME prn and no LOB - 12/27/17    Time  8    Period  Weeks    Status  On-going pt now agrees to work on this again      OT [place] #2   Title  Pt to perform simple meal prep at sup level using A/E and task modifications prn    Time  8    Period  Weeks    Status  On-going Pt now agrees to work on this again      OT LONG TERM GOAL #3   Title  Pt to perform light house management tasks at sup level including: laundry, washing dishes, cleaning, making bed    Time  8    Period  Weeks    Status  On-going Pt reports she is already washing dishes      OT LONG TERM GOAL #4   Title  Pt to demo 25% finger extension in prep for releasing objects Rt hand     Time  8    Period  Weeks    Status  On-going      OT LONG TERM GOAL #5   Title  Pt to perform environmental scanning while performing simple physical task at 90% accuracy    Time  8    Period  Weeks    Status  New      Long Term Additional Goals   Additional Long Term Goals  Yes      OT LONG TERM GOAL #6   Title  Pt to use Rt hand as stabalizer 25% of the time    Time  8    Period  Weeks    Status  New      OT LONG TERM GOAL #7   Title  Pt/family to verbalize understanding with neccessary accommodations, A/E,  etc that would be needed if pt is to return to living alone    Time  8    Period  Weeks    Status  New            Plan - 12/15/17 1222    Clinical Impression Statement  Pt progressing gradually with mobiliy and RUE in low level closed chain activities    Occupational Profile and client history currently impacting functional performance  no significant PMH but current impairments extensive    Occupational performance deficits (Please refer to evaluation for details):   ADL's;IADL's;Work;Leisure;Social Participation    Rehab Potential  Fair    Current Impairments/barriers affecting progress:  severity of deficits    OT Frequency  2x / week    OT Duration  8 weeks    OT Treatment/Interventions  Self-care/ADL training;Moist Heat;DME and/or AE instruction;Splinting;Therapeutic activities;Psychosocial skills training;Aquatic Therapy;Cognitive remediation/compensation;Therapeutic exercise;Coping strategies training;Neuromuscular education;Functional Mobility Training;Passive range of motion;Visual/perceptual remediation/compensation;Electrical Stimulation;Manual Therapy;Patient/family education    Plan  continue NMR RUE, estim (following session: ADLS)     Consulted and Agree with Plan of Care  Patient       Patient will benefit from skilled therapeutic intervention in order to improve the following deficits and impairments:  Decreased coordination, Decreased range of motion, Difficulty walking, Improper body mechanics, Decreased endurance, Decreased safety awareness, Impaired sensation, Improper spinal/pelvic alignment, Impaired tone, Decreased knowledge of precautions, Decreased knowledge of use of DME, Decreased balance, Impaired UE functional use, Pain, Decreased cognition, Decreased mobility, Decreased strength, Impaired perceived functional ability, Impaired vision/preception  Visit Diagnosis: Hemiplegia and hemiparesis following cerebral infarction affecting right dominant side (HCC)  Other symptoms and signs involving the nervous system  Muscle weakness (generalized)  Chronic right shoulder pain    Problem List Patient Active Problem List   Diagnosis Date Noted  . Fall 09/17/2017  . Hemiparesis of right dominant side as late effect of cerebral infarction ([place]) 09/17/2017  . Shoulder subluxation, right, initial encounter 09/17/2017  . Cardioembolic stroke ([place]) 49/17/9150  . Weakness 09/17/2017  . Aphasia 09/17/2017  . Dysphagia as late effect of  cerebral aneurysm 09/17/2017  . Cardiomyopathy ([place]) 09/17/2017  . H/O ischemic left MCA stroke 09/17/2017  . Cardiac LV ejection fraction 21-30%   . Graves disease   . Plaque psoriasis   . Neuropathy 05/19/2017    Carey Bullocks, OTR/L 12/15/2017, 12:24 PM  [facility] [address] [place], [place], [address] Phone: [phone]   Fax:  [phone]  Name: Maryella Abood MRN: [id number] Date of Birth: Sep 20, 1960

## 2017-12-15 NOTE — Patient Instructions (Signed)
Margaret Ortiz is able to use a single point cane in the house when family members are home. This could be purchased at a drug store or medical supply store.  HOME USE:   Continue using her small based quad cane in the community.

## 2017-12-15 NOTE — Patient Instructions (Addendum)
Practice your writing (language) by typing at home. You can send an email to [email or web address].  1. Tell me about where you were born. What is it like there?  2. Tell me about your family.  3. 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  4. Tell me about a place you have traveled.   5. What are some hobbies that you enjoy?

## 2017-12-16 ENCOUNTER — Ambulatory Visit: Payer: BLUE CROSS/BLUE SHIELD | Admitting: Occupational Therapy

## 2017-12-16 DIAGNOSIS — I69351 Hemiplegia and hemiparesis following cerebral infarction affecting right dominant side: Secondary | ICD-10-CM

## 2017-12-16 DIAGNOSIS — R29818 Other symptoms and signs involving the nervous system: Secondary | ICD-10-CM

## 2017-12-16 NOTE — Therapy (Signed)
[facility] [address] [place], [place], [address] Phone: [phone]   Fax:  [phone]  Occupational Therapy Treatment  Patient Details  Name: Margaret Ortiz MRN: [id number] Date of Birth: September 18, 1960 Referring Provider: Howard Pouch, DO   Encounter Date: 12/16/2017  OT End of Session - 12/16/17 1118    Visit Number  16    Number of Visits  24    Date for OT Re-Evaluation  12/27/17    Authorization Type  BC/BS    OT Start Time  1015    OT Stop Time  1100    OT Time Calculation (min)  45 min    Activity Tolerance  Patient tolerated treatment well    Behavior During Therapy  [facility] for tasks assessed/performed       Past Medical History:  Diagnosis Date  . 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  . Cardiac LV ejection fraction 21-30%   . Cardioembolic stroke ([facility]) 35/57/3220   Right hemiparesis  . Cardiomyopathy ([facility]) 04/2017  . Dysphagia 05/19/2017   Last known diet upgraded to regular diet with thin liquids on 06/27/2017, no records available  . Graves disease   . Hyperlipidemia   . Hypertension   . Neuropathy 05/19/2017   s/p stroke  . Plaque psoriasis     Past Surgical History:  Procedure Laterality Date  . NO PAST SURGERIES      There were no vitals filed for this visit.  Subjective Assessment - 12/16/17 1113    Patient is accompained by:  Family member brother-n-law    Pertinent History  Lt MCA CVA 05/19/2017, Graves dz    Patient Stated Goals  use my Rt arm    Currently in Pain?  No/denies Pt only had pain at Rt wrist initially w/ estim, but resolved when electrodes moved                   OT Treatments/Exercises (OP) - 12/16/17 0001      Neurological Re-education Exercises   Other Weight-Bearing Exercises 2  Wt bearing over elbows on K bench in tall kneeling with A/P wt shifts, Rt/Lt wt  shifts, followed by disengaging LUE to increase wt over Rt side. Progressed to modified push ups over K bench in tall kneeling.  Seated: wt bearing over Rt elbow with arm on body movements.  Seated: wt bearing over Rt arm (elbow extended) w/ lateral trunk flexion while performing high level ipsilateral reaching LUE       Electrical Stimulation   Electrical Stimulation Location  dorsal forearm    Electrical Stimulation Action  Wrist and finger extension    Electrical Stimulation Parameters  50 pps, 250 pw, 10 sec. on/off cycle, ramp 2 sec. x 10 min    Electrical Stimulation Goals  Neuromuscular facilitation               OT Short Term Goals - 12/02/17 1623      OT SHORT TERM GOAL #1   Title  Pt/family independent with HEP for RUE - 11/27/17    Time  4    Period  Weeks    Status  Achieved Pt has HEP already previously issued (reviewed by outpatient therapist)       OT SHORT TERM GOAL #2   Title  Independent with splint wear and care for Rt hand prn    Time  4  Period  Weeks    Status  Achieved      OT SHORT TERM GOAL #3   Title  Pt/family to verbalize understanding with A/E and task modifications to increase indpendence with ADLS/IADLS (for bathing, tying shoes, cutting food, simple meal prep)    Time  4    Period  Weeks    Status  Achieved      OT SHORT TERM GOAL #4   Title  Pt to perform bathing with only min assist and A/E prn while seated     Time  4    Period  Weeks    Status  On-going      OT SHORT TERM GOAL #5   Title  Pt to make sandwich w/ only supervision/cues prn    Time  4    Period  Weeks    Status  Achieved however, needs modifications and/or A/E to be independent      OT SHORT TERM GOAL #6   Title  Pt to demo 25 degrees shoulder flexion in prep for low level reaching with gross finger flexion    Time  4    Period  Weeks    Status  On-going      OT SHORT TERM GOAL #7   Title  Pt/family to verbalize understanding with strategies to increase  independence and safety with medication management including memory strategies prn     Time  4    Period  Weeks    Status  Deferred        OT Long Term Goals - 12/11/17 1217      OT LONG TERM GOAL #1   Title  Pt to perform shower transfers at mod I level w/ DME prn and no LOB - 12/27/17    Time  8    Period  Weeks    Status  On-going pt now agrees to work on this again      OT [place] #2   Title  Pt to perform simple meal prep at sup level using A/E and task modifications prn    Time  8    Period  Weeks    Status  On-going Pt now agrees to work on this again      OT LONG TERM GOAL #3   Title  Pt to perform light house management tasks at sup level including: laundry, washing dishes, cleaning, making bed    Time  8    Period  Weeks    Status  On-going Pt reports she is already washing dishes      OT LONG TERM GOAL #4   Title  Pt to demo 25% finger extension in prep for releasing objects Rt hand     Time  8    Period  Weeks    Status  On-going      OT LONG TERM GOAL #5   Title  Pt to perform environmental scanning while performing simple physical task at 90% accuracy    Time  8    Period  Weeks    Status  New      Long Term Additional Goals   Additional Long Term Goals  Yes      OT LONG TERM GOAL #6   Title  Pt to use Rt hand as stabalizer 25% of the time    Time  8    Period  Weeks    Status  New      OT LONG TERM  GOAL #7   Title  Pt/family to verbalize understanding with neccessary accommodations, A/E, etc that would be needed if pt is to return to living alone    Time  8    Period  Weeks    Status  New            Plan - 12/16/17 1118    Clinical Impression Statement  Pt progressing gradually with mobiliy and RUE in low level closed chain activities. Pt responds well to controlled wt bearing activities    Occupational Profile and client history currently impacting functional performance  no significant PMH but current impairments extensive     Rehab Potential  Fair    Current Impairments/barriers affecting progress:  severity of deficits    OT Frequency  3x / week    OT Duration  8 weeks    OT Treatment/Interventions  Self-care/ADL training;Moist Heat;DME and/or AE instruction;Splinting;Therapeutic activities;Psychosocial skills training;Aquatic Therapy;Cognitive remediation/compensation;Therapeutic exercise;Coping strategies training;Neuromuscular education;Functional Mobility Training;Passive range of motion;Visual/perceptual remediation/compensation;Electrical Stimulation;Manual Therapy;Patient/family education    Plan  simulate shower transfer and make recommendations prn for safety, functional mobility in kitchen, simulated laundry task    Consulted and Agree with Plan of Care  Patient;Family member/caregiver    Family Member Consulted  brother-n-law       Patient will benefit from skilled therapeutic intervention in order to improve the following deficits and impairments:  Decreased coordination, Decreased range of motion, Difficulty walking, Improper body mechanics, Decreased endurance, Decreased safety awareness, Impaired sensation, Improper spinal/pelvic alignment, Impaired tone, Decreased knowledge of precautions, Decreased knowledge of use of DME, Decreased balance, Impaired UE functional use, Pain, Decreased cognition, Decreased mobility, Decreased strength, Impaired perceived functional ability, Impaired vision/preception  Visit Diagnosis: Hemiplegia and hemiparesis following cerebral infarction affecting right dominant side (HCC)  Other symptoms and signs involving the nervous system    Problem List Patient Active Problem List   Diagnosis Date Noted  . Fall 09/17/2017  . 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 Shoulder subluxation, right, initial encounter 09/17/2017  . Cardioembolic stroke ([facility]) 69/62/9528  . Weakness 09/17/2017  . Aphasia 09/17/2017  .  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 Cardiomyopathy ([facility]) 09/17/2017  . H/O ischemic left MCA stroke 09/17/2017  . Cardiac LV ejection fraction 21-30%   . Graves disease   . Plaque psoriasis   . Neuropathy 05/19/2017    Carey Bullocks, OTR/L 12/16/2017, 11:20 AM  [facility] [address] [place], [place], [address] Phone: [phone]   Fax:  [phone]  Name: Margaret Ortiz MRN: [id number] Date of Birth: March 02, 1961

## 2017-12-16 NOTE — Therapy (Signed)
[facility] [address] [place], [place], [address] Phone: [phone]   Fax:  [phone]  Physical Therapy Treatment  Patient Details  Name: Margaret Ortiz MRN: [id number] Date of Birth: 03/05/1961 Referring Provider: Howard Pouch, DO   Encounter Date: 12/15/2017  PT End of Session - 12/16/17 0909    Visit Number  12    Number of Visits  17    Date for PT Re-Evaluation  12/27/17    Authorization Type  BCBS     PT Start Time  1022    PT Stop Time  1105    PT Time Calculation (min)  43 min    Equipment Utilized During Treatment  -- min guard to S    Activity Tolerance  Patient tolerated treatment well    Behavior During Therapy  [facility] for tasks assessed/performed       Past Medical History:  Diagnosis Date  . 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  . Cardiac LV ejection fraction 21-30%   . Cardioembolic stroke ([facility]) 54/62/7035   Right hemiparesis  . Cardiomyopathy ([facility]) 04/2017  . Dysphagia 05/19/2017   Last known diet upgraded to regular diet with thin liquids on 06/27/2017, no records available  . Graves disease   . Hyperlipidemia   . Hypertension   . Neuropathy 05/19/2017   s/p stroke  . Plaque psoriasis     Past Surgical History:  Procedure Laterality Date  . NO PAST SURGERIES      There were no vitals filed for this visit.  Subjective Assessment - 12/15/17 1028    Subjective  The patient's sister comes back with her today.  She inquires about social work support at our site due to "many questions" related to insurance, disability application, etc.    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       Currently in Pain?  No/denies                       [facility] Adult PT Treatment/Exercise - 12/15/17 1053      Ambulation/Gait   Ambulation/Gait  Yes    Ambulation/Gait Assistance  5: Supervision;4: Min assist    Ambulation/Gait Assistance Details  PT worked on R weight shift and Rhip facing anteriorly to avoid opening up to initiate swing phase of gait.  Patient initially ambulated withut a device with AFO x 115 feet, then used SPC x 340 feet with cues on equal stride length.  Patient does a 3 point gait pattern and PT attempted to work towards a 2 point gait pattern with SPC, however the timing was too challenging during today's session.    Ambulation Distance (Feet)  345 Feet    Assistive device  Straight cane;None    Ambulation Surface  Level;Indoor      Neuro Re-ed    Neuro Re-ed Details   R LE motor control activities including L hip supported on elevated mat table working on R LE knee flexion/hip flexion to mimic swing phase of gait; standing weight shifting activities int he parallel bars working on 4" step up laterally and step down with min A and tactile cues R Le. Standing  right weight shift with tactile cues and min A.   Min A with sit<>stand with mirror and with cues for R weight shift transitioning with left leg on compliant surface, standing  moving left foot to/from compliant surface.             PT Education - 12/16/17 0909    Education Details  Use of single point cane in home when family present for supervision and continued quad cane in community    Person(s) Educated  Patient    Methods  Explanation;Handout    Comprehension  Verbalized understanding       PT Short Term Goals - 12/04/17 1521      PT SHORT TERM GOAL #1   Title  The patient will perform HEP with cues from family for LE strength, standing balance, and motor control.    Baseline  Pt reports doing exercises at home - but only 3 exercises have been given to date - 12-02-17    Time  4     Period  Weeks    Status  Partially Met      PT SHORT TERM GOAL #2   Title  The patient will improve gait speed from 0.85 ft/sec to > or equal to 1.4 ft/sec to demonstrate transition from "household ambulator" to "limited community ambulator" classification of gait.    Status  Not Met      PT SHORT TERM GOAL #3   Title  The patient will improve Berg balance score from 32/56 to > or equal to 38/56 to demo dec'ing risk for falls.    Baseline  39/56 on 12/04/17    Time  4    Period  Weeks    Status  Achieved      PT SHORT TERM GOAL #4   Title  The patient will negotiate 4 steps with one rail and step to pattern mod indep.    Baseline  met 12-02-17    Status  Achieved      PT SHORT TERM GOAL #5   Title  The patient will ambulate x 500 ft nonstop mod indep for improving access to community surfaces.    Baseline  1 seated rest period needed after ([id number])    Status  Partially Met        PT Long Term Goals - 10/28/17 0740      PT LONG TERM GOAL #1   Title  The patient will perform HEP with intermittent assist for post d/c progression of exercise.    Time  8    Period  Weeks    Target Date  12/27/17      PT LONG TERM GOAL #2   Title  The patient will move floor<>stand with mod indep using L UE support on surface due to h/o falls.    Time  8    Period  Weeks    Target Date  12/27/17      PT LONG TERM GOAL #3   Title  The patient will improve gait speed from 0.85 ft/sec to > or equal to 1.8 ft/sec to demo dec'ing risk for falls.    Time  8    Period  Weeks    Target Date  12/27/17      PT LONG TERM GOAL #4   Title  The patient will improve Berg score from 32/56 to > or equal to 44/56 to demo dec'ing risk for falls.    Time  8    Period  Weeks    Target Date  12/27/17      PT LONG TERM GOAL #5   Title  The patient will negotiate 12 steps without a handrail with step to pattern mod indep to access home entries.    Time  8    Period  Weeks    Target Date  12/27/17             Plan - 12/16/17 0913    Clinical Impression Statement  The patient and PT worked on use of SPC and PT recommended in home when supervision present to improve reciprocal pattern of gait.  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  Continue working to [employer] anticipating renewal x 8 more weeks next week.    PT Treatment/Interventions  ADLs/Self Care Home Management;Gait training;Stair training;Functional mobility training;Therapeutic activities;Therapeutic exercise;Balance training;Neuromuscular re-education;Patient/family education;Orthotic Fit/Training;Electrical Stimulation;Manual techniques;DME Instruction    PT Next Visit Plan  LONG TERM GOALS DUE NEXT WEEK.Session with Gerald Stabs when available, Continue with Bioness (tablet 1) for both lower and upper unit RLE, R side strengthening, hip initiation during gait tasks, right LE loading/weight shifting.  gait training (in clinic without SBQC to encourage more reciprocal pattern),     Recommended Other Services  Sent e-mail to social work today for consult    Consulted and Agree with Plan of Care  Patient       Patient will benefit from skilled therapeutic intervention in order to improve the following deficits and impairments:  Abnormal gait, Impaired sensation, Pain, Postural dysfunction, Impaired tone, Decreased mobility, Decreased coordination, Decreased activity tolerance, Decreased endurance, Decreased strength, Difficulty walking, Decreased balance  Visit Diagnosis: Other symptoms and signs involving the nervous system  Unsteadiness on feet  Other abnormalities of gait and mobility  Muscle weakness (generalized)     Problem List Patient Active Problem List   Diagnosis Date Noted  . Fall 09/17/2017  . Hemiparesis of right dominant side as late effect of cerebral infarction ([facility]) 09/17/2017  . Shoulder subluxation, right, initial encounter 09/17/2017  . Cardioembolic stroke ([facility]) 25/10/3974  .  Weakness 09/17/2017  . Aphasia 09/17/2017  . 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 Cardiomyopathy ([facility]) 09/17/2017  . H/O ischemic left MCA stroke 09/17/2017  . Cardiac LV ejection fraction 21-30%   . Graves disease   . Plaque psoriasis   . Neuropathy 05/19/2017    WEAVER,CHRISTINA, PT 12/16/2017, 9:15 AM  [facility] [address] [place], [place], [address] Phone: [phone]   Fax:  [phone]  Name: Margaret Ortiz MRN: [id number] Date of Birth: 1961-05-07

## 2017-12-17 ENCOUNTER — Ambulatory Visit: Payer: BLUE CROSS/BLUE SHIELD | Admitting: Occupational Therapy

## 2017-12-17 ENCOUNTER — Ambulatory Visit: Payer: BLUE CROSS/BLUE SHIELD | Admitting: Rehabilitative and Restorative Service Providers"

## 2017-12-17 ENCOUNTER — Encounter: Payer: Self-pay | Admitting: Rehabilitative and Restorative Service Providers"

## 2017-12-17 DIAGNOSIS — R2689 Other abnormalities of gait and mobility: Secondary | ICD-10-CM

## 2017-12-17 DIAGNOSIS — M6281 Muscle weakness (generalized): Secondary | ICD-10-CM

## 2017-12-17 DIAGNOSIS — R2681 Unsteadiness on feet: Secondary | ICD-10-CM

## 2017-12-17 DIAGNOSIS — R29818 Other symptoms and signs involving the nervous system: Secondary | ICD-10-CM

## 2017-12-17 DIAGNOSIS — I69351 Hemiplegia and hemiparesis following cerebral infarction affecting right dominant side: Secondary | ICD-10-CM

## 2017-12-17 NOTE — Therapy (Signed)
[facility] [address] [place], [place], [address] Phone: [phone]   Fax:  [phone]  Occupational Therapy Treatment  Patient Details  Name: Margaret Ortiz MRN: [id number] Date of Birth: 09/16/1960 Referring Provider: Howard Pouch, DO   Encounter Date: 12/17/2017  OT End of Session - 12/17/17 1404    Visit Number  17    Number of Visits  24    Date for OT Re-Evaluation  12/27/17    Authorization Type  BC/BS    OT Start Time  1102    OT Stop Time  1150    OT Time Calculation (min)  48 min    Activity Tolerance  Patient tolerated treatment well    Behavior During Therapy  [facility] for tasks assessed/performed       Past Medical History:  Diagnosis Date  . 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  . Cardiac LV ejection fraction 21-30%   . Cardioembolic stroke ([place]) 35/57/3220   Right hemiparesis  . Cardiomyopathy ([place]) 04/2017  . Dysphagia 05/19/2017   Last known diet upgraded to regular diet with thin liquids on 06/27/2017, no records available  . Graves disease   . Hyperlipidemia   . Hypertension   . Neuropathy 05/19/2017   s/p stroke  . Plaque psoriasis     Past Surgical History:  Procedure Laterality Date  . NO PAST SURGERIES      There were no vitals filed for this visit.  Subjective Assessment - 12/17/17 1232    Subjective   Pt's sister reports, "This session has been helpful in problem solving" (re: shower transfers and ADLS)    Patient is accompained by:  Family member sister, brother-n-law    Pertinent History  Lt MCA CVA 05/19/2017, Graves dz    Patient Stated Goals  use my Rt arm    Currently in Pain?  No/denies                   OT Treatments/Exercises (OP) - 12/17/17 1356      ADLs   Functional Mobility  Discussed safety with shower transfers and had pt simulate in clinic - recommended  shower mat or stickers to prevent slipping in shower. Also recommended grab bar and told where to place in shower for best support. Also recommended shower chair with back (pt only has stool). Still advised supervision d/t weakness and decr. ability to pick up Rt LE.     Home Maintenance  Pt placed clothes in washer then removed and placed in dryer w/o LOB and demo good safety. Discussed ways to transport laundry     ADL Comments  Discussed further task modifications and A/E needs for kitchen tasks and safety. Recommended using electric kettle for coffee, toaster oven for anything to bake/broil (vs. real oven), pot stabalizer for anything on stove, and one handed cutting board if pt wishes to cut veggies/fruit. Also discussed possibility of [employer] and someone coming in to provide heavier cleaning (changing sheets, vacuuming) if pt were to return to living alone in the future.                OT Short Term Goals - 12/02/17 1623      OT SHORT TERM GOAL #1   Title  Pt/family independent with HEP for RUE - 11/27/17    Time  4    Period  Weeks    Status  Achieved Pt has HEP already previously issued (reviewed by outpatient therapist)       OT SHORT TERM GOAL #2   Title  Independent with splint wear and care for Rt hand prn    Time  4    Period  Weeks    Status  Achieved      OT SHORT TERM GOAL #3   Title  Pt/family to verbalize understanding with A/E and task modifications to increase indpendence with ADLS/IADLS (for bathing, tying shoes, cutting food, simple meal prep)    Time  4    Period  Weeks    Status  Achieved      OT SHORT TERM GOAL #4   Title  Pt to perform bathing with only min assist and A/E prn while seated     Time  4    Period  Weeks    Status  On-going      OT SHORT TERM GOAL #5   Title  Pt to make sandwich w/ only supervision/cues prn    Time  4    Period  Weeks    Status  Achieved however, needs modifications and/or A/E to be independent      OT SHORT TERM  GOAL #6   Title  Pt to demo 25 degrees shoulder flexion in prep for low level reaching with gross finger flexion    Time  4    Period  Weeks    Status  On-going      OT SHORT TERM GOAL #7   Title  Pt/family to verbalize understanding with strategies to increase independence and safety with medication management including memory strategies prn     Time  4    Period  Weeks    Status  Deferred        OT Long Term Goals - 12/11/17 1217      OT LONG TERM GOAL #1   Title  Pt to perform shower transfers at mod I level w/ DME prn and no LOB - 12/27/17    Time  8    Period  Weeks    Status  On-going pt now agrees to work on this again      OT [place] #2   Title  Pt to perform simple meal prep at sup level using A/E and task modifications prn    Time  8    Period  Weeks    Status  On-going Pt now agrees to work on this again      OT LONG TERM GOAL #3   Title  Pt to perform light house management tasks at sup level including: laundry, washing dishes, cleaning, making bed    Time  8    Period  Weeks    Status  On-going Pt reports she is already washing dishes      OT LONG TERM GOAL #4   Title  Pt to demo 25% finger extension in prep for releasing objects Rt hand     Time  8    Period  Weeks    Status  On-going      OT LONG TERM GOAL #5   Title  Pt to perform environmental scanning while performing simple physical task at 90% accuracy    Time  8    Period  Weeks    Status  New      Long Term Additional Goals   Additional Long Term Goals  Yes      OT  LONG TERM GOAL #6   Title  Pt to use Rt hand as stabalizer 25% of the time    Time  8    Period  Weeks    Status  New      OT LONG TERM GOAL #7   Title  Pt/family to verbalize understanding with neccessary accommodations, A/E, etc that would be needed if pt is to return to living alone    Time  8    Period  Weeks    Status  New            Plan - 12/17/17 1405    Clinical Impression Statement  Pt with increased  independence with ADLS and IADLS.     Occupational Profile and client history currently impacting functional performance  no significant PMH but current impairments extensive    Occupational performance deficits (Please refer to evaluation for details):  ADL's;IADL's;Work;Leisure;Social Participation    Rehab Potential  Fair    Current Impairments/barriers affecting progress:  severity of deficits    OT Frequency  3x / week    OT Duration  8 weeks    OT Treatment/Interventions  Self-care/ADL training;Moist Heat;DME and/or AE instruction;Splinting;Therapeutic activities;Psychosocial skills training;Aquatic Therapy;Cognitive remediation/compensation;Therapeutic exercise;Coping strategies training;Neuromuscular education;Functional Mobility Training;Passive range of motion;Visual/perceptual remediation/compensation;Electrical Stimulation;Manual Therapy;Patient/family education    Plan  Continue NMR, Begin checking and modifying/updating remaining goals prn in prep for renewal next week.     Consulted and Agree with Plan of Care  Patient;Family member/caregiver    Family Member Consulted  brother-n-law and pt's sister       Patient will benefit from skilled therapeutic intervention in order to improve the following deficits and impairments:  Decreased coordination, Decreased range of motion, Difficulty walking, Improper body mechanics, Decreased endurance, Decreased safety awareness, Impaired sensation, Improper spinal/pelvic alignment, Impaired tone, Decreased knowledge of precautions, Decreased knowledge of use of DME, Decreased balance, Impaired UE functional use, Pain, Decreased cognition, Decreased mobility, Decreased strength, Impaired perceived functional ability, Impaired vision/preception  Visit Diagnosis: Hemiplegia and hemiparesis following cerebral infarction affecting right dominant side (HCC)  Unsteadiness on feet    Problem List Patient Active Problem List   Diagnosis Date Noted   . Fall 09/17/2017  . Hemiparesis of right dominant side as late effect of cerebral infarction ([place]) 09/17/2017  . Shoulder subluxation, right, initial encounter 09/17/2017  . Cardioembolic stroke ([place]) 93/90/3009  . Weakness 09/17/2017  . Aphasia 09/17/2017  . 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 Cardiomyopathy ([place]) 09/17/2017  . H/O ischemic left MCA stroke 09/17/2017  . Cardiac LV ejection fraction 21-30%   . Graves disease   . Plaque psoriasis   . Neuropathy 05/19/2017    Carey Bullocks, OTR/L 12/17/2017, 2:07 PM  [facility] [address] [place], [place], [address] Phone: [phone]   Fax:  [phone]  Name: Margaret Ortiz MRN: [id number] Date of Birth: 1961-02-22

## 2017-12-17 NOTE — Therapy (Signed)
[facility] [address] [place], [place], [address] Phone: [phone]   Fax:  [phone]  Physical Therapy Treatment  Patient Details  Name: Margaret Ortiz MRN: [id number] Date of Birth: 1961/04/19 Referring Provider: Howard Pouch, DO   Encounter Date: 12/17/2017  PT End of Session - 12/17/17 1250    Visit Number  13    Number of Visits  17    Date for PT Re-Evaluation  12/27/17    Authorization Type  BCBS     PT Start Time  1020    PT Stop Time  1103    PT Time Calculation (min)  43 min    Equipment Utilized During Treatment  -- min guard to S    Activity Tolerance  Patient tolerated treatment well    Behavior During Therapy  [facility] for tasks assessed/performed       Past Medical History:  Diagnosis Date  . 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  . Cardiac LV ejection fraction 21-30%   . Cardioembolic stroke ([place]) 52/48/1859   Right hemiparesis  . Cardiomyopathy ([place]) 04/2017  . Dysphagia 05/19/2017   Last known diet upgraded to regular diet with thin liquids on 06/27/2017, no records available  . Graves disease   . Hyperlipidemia   . Hypertension   . Neuropathy 05/19/2017   s/p stroke  . Plaque psoriasis     Past Surgical History:  Procedure Laterality Date  . NO PAST SURGERIES      There were no vitals filed for this visit.  Subjective Assessment - 12/17/17 1248    Subjective  The patient's sister attended session inquiring about ankle foot orthoses appointment (primary PT to check on progress with making appt).    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      Currently  in Pain?  No/denies                       [facility] Adult PT Treatment/Exercise - 12/17/17 1255      Ambulation/Gait   Ambulation/Gait  Yes    Ambulation/Gait Assistance  4: Min assist    Ambulation/Gait Assistance Details  Emphasized motor control with R LE using bioness for ambulation on R anterior tibialis and R hamstrings.  Tactile cues at right scapula for increase right weight shifting, tactile cues intermittently to facilitate R knee flexion to initiate swing, and tactile cues hips for positioning for anterior cues R hip.    Ambulation Distance (Feet)  230 Feet x 4 reps    Assistive device  Straight cane;Small based quad cane    Ambulation Surface  Level;Indoor      Neuro Re-ed    Neuro Re-ed Details   Sit<>stand with bioness donned and mirror for visual cues working on moving with e-stim for motor re-education.  Standing weight shifting emphasizing R stance knee and hip control and moving L LE onto/off of compliant surface.       Modalities   Modalities  Teacher, English as a foreign language Location  R anterior tibialis and R hamstrings    Electrical Stimulation Action  ankle DF  and knee flexion    Electrical Stimulation Parameters  see bioness parameter    Electrical Stimulation Goals  Neuromuscular facilitation             PT Education - 12/17/17 1249    Education Details  reviewed education for family on recommendation for [facility] in home with family present    Person(s) Educated  Patient;Caregiver(s)    Methods  Explanation    Comprehension  Verbalized understanding       PT Short Term Goals - 12/04/17 1521      PT SHORT TERM GOAL #1   Title  The patient will perform HEP with cues from family for LE strength, standing balance, and motor control.    Baseline  Pt reports doing exercises at home - but only 3 exercises have been given to date - 12-02-17    Time  4    Period  Weeks    Status  Partially Met      PT  SHORT TERM GOAL #2   Title  The patient will improve gait speed from 0.85 ft/sec to > or equal to 1.4 ft/sec to demonstrate transition from "household ambulator" to "limited community ambulator" classification of gait.    Status  Not Met      PT SHORT TERM GOAL #3   Title  The patient will improve Berg balance score from 32/56 to > or equal to 38/56 to demo dec'ing risk for falls.    Baseline  39/56 on 12/04/17    Time  4    Period  Weeks    Status  Achieved      PT SHORT TERM GOAL #4   Title  The patient will negotiate 4 steps with one rail and step to pattern mod indep.    Baseline  met 12-02-17    Status  Achieved      PT SHORT TERM GOAL #5   Title  The patient will ambulate x 500 ft nonstop mod indep for improving access to community surfaces.    Baseline  1 seated rest period needed after (480-56)    Status  Partially Met        PT Long Term Goals - 10/28/17 0740      PT LONG TERM GOAL #1   Title  The patient will perform HEP with intermittent assist for post d/c progression of exercise.    Time  8    Period  Weeks    Target Date  12/27/17      PT LONG TERM GOAL #2   Title  The patient will move floor<>stand with mod indep using L UE support on surface due to h/o falls.    Time  8    Period  Weeks    Target Date  12/27/17      PT LONG TERM GOAL #3   Title  The patient will improve gait speed from 0.85 ft/sec to > or equal to 1.8 ft/sec to demo dec'ing risk for falls.    Time  8    Period  Weeks    Target Date  12/27/17      PT LONG TERM GOAL #4   Title  The patient will improve Berg score from 32/56 to > or equal to 44/56 to demo dec'ing risk for falls.    Time  8    Period  Weeks    Target Date  12/27/17      PT LONG TERM GOAL #5   Title  The patient will negotiate 12 steps without a handrail with step to pattern mod indep to access home entries.    Time  8    Period  Weeks    Target Date  12/27/17            Plan - 12/17/17 1251    Clinical  Impression Statement  Used bioness today during session in order to work on right LE motor control of right hamstrings and ankle dorsiflexors.   PT to continue working to [employer] emphasizing improved right weight shifting, proper fitting AFO, and moe reciprocal nature of gait.     PT Treatment/Interventions  ADLs/Self Care Home Management;Gait training;Stair training;Functional mobility training;Therapeutic activities;Therapeutic exercise;Balance training;Neuromuscular re-education;Patient/family education;Orthotic Fit/Training;Electrical Stimulation;Manual techniques;DME Instruction    PT Next Visit Plan  LONG TERM GOALS DUE NEXT WEEK.Session with Gerald Stabs when available, Continue with Bioness (tablet 1) for both lower and upper unit RLE, R side strengthening, hip initiation during gait tasks, right LE loading/weight shifting.  gait training (in clinic without SBQC to encourage more reciprocal pattern),     Consulted and Agree with Plan of Care  Patient       Patient will benefit from skilled therapeutic intervention in order to improve the following deficits and impairments:  Abnormal gait, Impaired sensation, Pain, Postural dysfunction, Impaired tone, Decreased mobility, Decreased coordination, Decreased activity tolerance, Decreased endurance, Decreased strength, Difficulty walking, Decreased balance  Visit Diagnosis: Other symptoms and signs involving the nervous system  Muscle weakness (generalized)  Unsteadiness on feet  Other abnormalities of gait and mobility     Problem List Patient Active Problem List   Diagnosis Date Noted  . Fall 09/17/2017  . Hemiparesis of right dominant side as late effect of cerebral infarction ([place]) 09/17/2017  . Shoulder subluxation, right, initial encounter 09/17/2017  . Cardioembolic stroke ([place]) 88/41/6606  . Weakness 09/17/2017  . Aphasia 09/17/2017  . 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 Cardiomyopathy ([place]) 09/17/2017  . H/O  ischemic left MCA stroke 09/17/2017  . Cardiac LV ejection fraction 21-30%   . Graves disease   . Plaque psoriasis   . Neuropathy 05/19/2017    Chosen Garron, PT 12/17/2017, 1:07 PM  [facility] [address] [place], [place], [address] Phone: [phone]   Fax:  [phone]  Name: Margaret Ortiz MRN: [id number] Date of Birth: May 29, 1961

## 2017-12-18 ENCOUNTER — Encounter: Payer: Self-pay | Admitting: Neurology

## 2017-12-18 ENCOUNTER — Encounter

## 2017-12-18 ENCOUNTER — Ambulatory Visit (INDEPENDENT_AMBULATORY_CARE_PROVIDER_SITE_OTHER): Payer: BLUE CROSS/BLUE SHIELD | Admitting: Neurology

## 2017-12-18 ENCOUNTER — Other Ambulatory Visit (INDEPENDENT_AMBULATORY_CARE_PROVIDER_SITE_OTHER): Payer: BLUE CROSS/BLUE SHIELD

## 2017-12-18 ENCOUNTER — Ambulatory Visit: Payer: BLUE CROSS/BLUE SHIELD | Admitting: Speech Pathology

## 2017-12-18 VITALS — BP 110/68 | HR 67 | Ht 62.0 in | Wt 125.0 lb

## 2017-12-18 DIAGNOSIS — I69351 Hemiplegia and hemiparesis following cerebral infarction affecting right dominant side: Secondary | ICD-10-CM | POA: Diagnosis not present

## 2017-12-18 DIAGNOSIS — I63412 Cerebral infarction due to embolism of left middle cerebral artery: Secondary | ICD-10-CM

## 2017-12-18 DIAGNOSIS — I69320 Aphasia following cerebral infarction: Secondary | ICD-10-CM | POA: Diagnosis not present

## 2017-12-18 DIAGNOSIS — R4701 Aphasia: Secondary | ICD-10-CM

## 2017-12-18 DIAGNOSIS — I429 Cardiomyopathy, unspecified: Secondary | ICD-10-CM | POA: Diagnosis not present

## 2017-12-18 DIAGNOSIS — R29818 Other symptoms and signs involving the nervous system: Secondary | ICD-10-CM | POA: Diagnosis not present

## 2017-12-18 LAB — LIPID PANEL
Cholesterol: 181 mg/dL (ref 0–200)
HDL: 64.5 mg/dL (ref >39.00)
LDL CALC: 97 mg/dL (ref 0–99)
NonHDL: 116.97
Total CHOL/HDL Ratio: 3
Triglycerides: 98 mg/dL (ref 0.0–149.0)
VLDL: 19.6 mg/dL (ref 0.0–40.0)

## 2017-12-18 NOTE — Progress Notes (Signed)
NEUROLOGY CONSULTATION NOTE  Margaret Ortiz MRN: [id number] DOB: [birth year]  Referring provider: Dr. Raoul Pitch Primary care provider: Dr. 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  She is accompanied by her sister and brother-in-law who supplement history.  History is also supplemented by rehab notes.  She was admitted to [facility] in [place] on 05/19/17 for left MCA stroke.  She was found to have a thrombus on imaging but was determined not to be a candidate for intervention due to completed stroke.  She was diagnosed with cardiomyopathy as echocardiogram demonstrated a LV EF of 15-20%.  Other testing is not available to me at this time.  Repeat echocardiogram on 11/30 showed improved EF of 20-25%.  It was believed that her stroke was likely cardioembolic secondary to undiagnosed cardiomyopathy.  She was started on Coumadin.  She had developed significant dysphagia.  MBS on 06/27/17 indicated that her diet could be upgraded to regular diet with thin liquids.  She was admitted to inpatient rehab until March.   She has since moved to [place] to live with her sister until she recovers well enough to move back to [place] alone.  She continues to have residual right sided upper and lower extremity weakness.  She is currently undergoing outpatient rehab (speech/PT/OT).  Speech is improved.  She is swallowing well and is on a regular diet.  Strength in her right leg is improved and is now able to ambulate with cane.  She is still plegic in the right arm.  She is independent and manages her finances and performs ADLs.  Most recent echocardiogram from 08/20/17 showed EF of 15-20%.  She is taking Eliquis and atorvastatin.  10/01/17 LABS:  CBC with WBC 7.1, HGB  13.6, HCT 41.4, PLT 332; CMP with Na 136, K 4.7, glucose 85, BUN 11, Cr 0.84, t bili 0.8, ALP 90, AST 22, ALT 21.  PAST MEDICAL HISTORY: Past Medical History:  Diagnosis Date  . 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  . Cardiac LV ejection fraction 21-30%   . Cardioembolic stroke ([place]) 98/33/8250   Right hemiparesis  . Cardiomyopathy ([place]) 04/2017  . Dysphagia 05/19/2017   Last known diet upgraded to regular diet with thin liquids on 06/27/2017, no records available  . Graves disease   . Hyperlipidemia   . Hypertension   . Neuropathy 05/19/2017   s/p stroke  . Plaque psoriasis     PAST SURGICAL HISTORY: Past Surgical History:  Procedure Laterality Date  . NO PAST SURGERIES      MEDICATIONS: Current Outpatient Medications on File Prior to Visit  Medication Sig Dispense Refill  . acetaminophen (TYLENOL) 325 MG tablet Take 650 mg by mouth every 6 (six) hours as needed.    Marland Kitchen apixaban (ELIQUIS) 5 MG TABS tablet Take 1 tablet (5 mg total) by mouth 2 (two) times daily. 180 tablet 1  . atorvastatin (LIPITOR) 40 MG tablet Take 1 tablet (40 mg total) by mouth daily. 90 tablet 1  . baclofen 5 MG TABS Take 5 mg by mouth 3 (three) times daily. 270 tablet 1  . clobetasol cream (TEMOVATE) 5.39 % Apply 1 application topically 2 (two) times daily. 30 g 0  . esomeprazole (  NEXIUM) 40 MG capsule Take 1 capsule (40 mg total) by mouth daily at 12 noon. 90 capsule 1  . gabapentin (NEURONTIN) 300 MG capsule Take 2 capsules (600 mg total) by mouth at bedtime. 180 capsule 1  . metoprolol tartrate (LOPRESSOR) 25 MG tablet Take 0.5 tablets (12.5 mg total) by mouth 2 (two) times daily. 90 tablet 3  . senna (SENOKOT) 8.6 MG TABS tablet Take 1 tablet by mouth.    . sodium chloride (OCEAN) 0.65 % SOLN nasal spray Place 1 spray into both nostrils as needed for congestion.     No current facility-administered medications  on file prior to visit.     ALLERGIES: Allergies  Allergen Reactions  . Dust Mite Extract   . Penicillins     FAMILY HISTORY: Family History  Problem Relation Age of Onset  . Hyperlipidemia Mother   . Hypertension Mother   . Early death Father   . Hypertension Sister   . Heart attack Brother   . Mental illness Brother     SOCIAL HISTORY: Social History   Socioeconomic History  . Marital status: Divorced    Spouse name: Not on file  . Number of children: 1  . Years of education: 43  . Highest education level: Professional school degree (e.g., MD, DDS, DVM, JD)  Occupational History  . 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 Financial resource strain: Not on file  . Food insecurity:    Worry: Not on file    Inability: Not on file  . Transportation needs:    Medical: Not on file    Non-medical: Not on file  Tobacco Use  . Smoking status: Former Research scientist (life sciences)  . Smokeless tobacco: Never Used  Substance and Sexual Activity  . Alcohol use: Never    Frequency: Never  . Drug use: Never  . Sexual activity: Not Currently  Lifestyle  . Physical activity:    Days per week: Not on file    Minutes per session: Not on file  . Stress: Not on file  Relationships  . Social connections:    Talks on phone: Not on file    Gets together: Not on file    Attends religious service: Not on file    Active member of club or organization: Not on file    Attends meetings of clubs or organizations: Not on file    Relationship status: Not on file  . Intimate partner violence:    Fear of current or ex partner: Not on file    Emotionally abused: Not on file    Physically abused: Not on file    Forced sexual activity: Not on file  Other Topics Concern  . Not on file  Social History Narrative   Divorced.  From [place], moved to [place] to stay with her family after having a stroke March 2019   Graduate degree, works as a Community education officer.   Former smoker.   Exercise routinely  prior to stroke.   Wears a hearing aid.   Smoke alarms in the home.   Wears her seatbelt.      Patient is right-handed. She drinks one cup of coffee a day. She now resides with her sister and brother-in-law.    REVIEW OF SYSTEMS: Constitutional: No fevers, chills, or sweats, no generalized fatigue, change in appetite Eyes: No visual changes, double vision, eye pain Ear, nose and throat: No hearing loss, ear pain, nasal congestion, sore throat Cardiovascular: No chest pain, palpitations Respiratory:  No shortness of breath at rest or with exertion, wheezes GastrointestinaI: No nausea, vomiting, diarrhea, abdominal pain, fecal incontinence Genitourinary:  No dysuria, urinary retention or frequency Musculoskeletal:  No neck pain, back pain Integumentary: No rash, pruritus, skin lesions Neurological: as above Psychiatric: No depression, insomnia, anxiety Endocrine: No palpitations, fatigue, diaphoresis, mood swings, change in appetite, change in weight, increased thirst Hematologic/Lymphatic:  No purpura, petechiae. Allergic/Immunologic: no itchy/runny eyes, nasal congestion, recent allergic reactions, rashes  PHYSICAL EXAM: Vitals:   12/18/17 0942  BP: 110/68  Pulse: 67  SpO2: 98%   General: No acute distress.  Patient appears well-groomed.  Head:  Normocephalic/atraumatic Eyes:  fundi examined but not visualized Neck: supple, no paraspinal tenderness, full range of motion Back: No paraspinal tenderness Heart: regular rate and rhythm Lungs: Clear to auscultation bilaterally. Vascular: No carotid bruits. Neurological Exam: Mental status: alert and oriented to person, place, and time, recent and remote memory intact, fund of knowledge intact, attention and concentration intact, speech mildly dysfluent and not dysarthric.  Able to repeat with some mild hesitancy.  Able to follow commands. Cranial nerves: CN I: not tested CN II: pupils equal, round and reactive to light, visual  fields intact CN III, IV, VI:  full range of motion, no nystagmus, no ptosis CN V: facial sensation intact CN VII: Mild right lower facial weakness CN VIII: hearing intact CN IX, X: gag intact, uvula midline CN XI: sternocleidomastoid and trapezius muscles intact CN XII: tongue midline Bulk & Tone: normal, no fasciculations. Motor:  Right upper extremity plegic.  1+/5 right finger flexion.  4+/5 right ankle dorsiflexion.  Otherwise, 5/5. Sensation:  Pinprick and vibration sensation intact. Deep Tendon Reflexes:  3+ right upper and lower extremities, 2+ left upper and lower extremities, toes downgoing. Finger to nose testing:  Without dysmetria.  Gait:  Right hemiplegic gait.  Able to turn, unable to ttandem walk. Romberg negative.  IMPRESSION: 1.  Right hemiparesis and expressive aphasia secondary to left MCA stroke, likely cardioembolic 2.  Cardiomyopathy  PLAN: 1.  1.  Continue Eliquis 2.  Continue atorvastatin.  We will check lipid panel today 3.  Continue speech/occupational/physical therapy 4.  Obtain hospital records 5.  Follow up in 6 months  Thank you for allowing me to take part in the care of this patient.  Metta Clines, DO  CC: Howard Pouch, DO

## 2017-12-18 NOTE — Patient Instructions (Addendum)
1.  Continue Eliquis 2.  Continue atorvastatin.  We will check lipid panel today 3.  Continue speech/occupational/physical therapy 4.  Follow up in 6 months  Your provider has requested that you have labwork completed today. Please go to [facility] ([id number]) on the second floor of this building before leaving the office today. You do not need to check in. If you are not called within 15 minutes please check with the front desk.

## 2017-12-18 NOTE — Therapy (Signed)
[facility] [address] [place], [place], [address] Phone: [phone]   Fax:  [phone]  Speech Language Pathology Treatment  Patient Details  Name: Margaret Ortiz MRN: [id number] Date of Birth: 01-08-1961 Referring Provider: Ma Hillock, DO   Encounter Date: 12/18/2017  End of Session - 12/18/17 1744    Visit Number  14    Number of Visits  17    Date for SLP Re-Evaluation  01/09/18    Authorization Type  BCBS    SLP Start Time  1619    SLP Stop Time   1701    SLP Time Calculation (min)  42 min    Activity Tolerance  Patient tolerated treatment well       Past Medical History:  Diagnosis Date  . 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  . Cardiac LV ejection fraction 21-30%   . Cardioembolic stroke ([place]) 72/90/2111   Right hemiparesis  . Cardiomyopathy ([place]) 04/2017  . Dysphagia 05/19/2017   Last known diet upgraded to regular diet with thin liquids on 06/27/2017, no records available  . Graves disease   . Hyperlipidemia   . Hypertension   . Neuropathy 05/19/2017   s/p stroke  . Plaque psoriasis     Past Surgical History:  Procedure Laterality Date  . NO PAST SURGERIES      There were no vitals filed for this visit.  Subjective Assessment - 12/18/17 1735    Subjective  "I went to the doctor this morning"    Currently in Pain?  No/denies            ADULT SLP TREATMENT - 12/18/17 1619      General Information   Behavior/Cognition  Alert;Cooperative;Pleasant mood    Patient Positioning  Upright in chair      Treatment Provided   Treatment provided  Cognitive-Linquistic      Pain Assessment   Pain Assessment  No/denies pain      Cognitive-Linquistic Treatment   Treatment focused on  Aphasia    Skilled Treatment  SLP assessed cognition with Cognitive Linguistic Quick Test (aphasia  administration). Non-linguistic cognition score was 44 (39-49 is considered WNL) and Linguistic/Aphasia score was 19 (0-21 is considered Severe). Slow processing was noted throughout testing tasks including during symbol cancellation, mazes, and symbol trails. Pt completed these tasks just within the allowed time, and decreased alternating attention with errors during trial tasks for symbol trails. Clock drawing was mildly disorganized, with poor spacing/placement of digits.       Assessment / Recommendations / Plan   Plan  Continue with current plan of care      Progression Toward Goals   Progression toward goals  Progressing toward goals       SLP Education - 12/18/17 1743    Education provided  Yes    Education Details  results of cognitive assessment; SLP suspects higher level attention, executive function deficits    Person(s) Educated  Patient    Methods  Explanation    Comprehension  Verbalized understanding;Need further instruction       SLP Short Term Goals - 12/18/17 1746      SLP SHORT TERM GOAL #1   Title  Pt will complete simple naming tasks with error awareness 80% accuracy with occasional min A (compensations allowed)    Status  Achieved      SLP SHORT TERM GOAL #2  Title  Pt will participate functionally in 5 minutes simple conversation with conversational supports for aphasia over 3 sessions.     Status  Not Met      SLP SHORT TERM GOAL #3   Title  Pt will comprehend 3-step/3 component verbal directions with occasional min A 80% accuracy    Status  Not Met      SLP SHORT TERM GOAL #4   Title  Pt will undergo further assessment of writing, reading comprehension.    Status  Partially Met      SLP SHORT TERM GOAL #5   Title  Pt will demo understanding of 8 minutes of simple conversation over 3 sessions with compensations for comprehension    Status  Partially Met       SLP Long Term Goals - 12/18/17 1746      SLP LONG TERM GOAL #1   Title  Pt will  participate functionally in 10 minutes simple-mod complex conversation with conversational supports for aphasia over 3 sessions.    Baseline  12/15/17    Time  2    Period  Weeks    Status  On-going      SLP LONG TERM GOAL #2   Title  Pt will demo understanding of 10 minutes simple-mod complex conversation with compensations over 3 sessions    Time  2    Period  Weeks    Status  On-going      SLP LONG TERM GOAL #3   Title  Pt will demo error awareness by attempting correction or by nonverbal response to errors with 90% success    Time  2    Period  Weeks    Status  On-going       Plan - 12/18/17 1750    Clinical Impression Statement  Pt continues to present with 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 Fluidity of simple conversation is improving; patient's awareness of errors deficient. Written keywords and drawings helpful for auditory comprehension. Paraphasias and neologisms persist affecting simple conversation, and aphasic written language has been observed today. SLP performed further assessment today to differentiate linguistic vs non-linguistic cognitive deficits. 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 Will update POC at recertification and add cognitive goals as appropriate. Continue skilled ST to maximize functional communication for independence and QOL.     Speech Therapy Frequency  2x / week    Treatment/Interventions  Multimodal communcation approach;Compensatory strategies;Language facilitation;Compensatory techniques;Cueing hierarchy;Internal/external aids;Functional tasks;SLP instruction and feedback;Patient/family education    Potential to Achieve Goals  Good    Potential Considerations  Severity of impairments    Consulted and Agree with Plan of Care  Patient        Patient will benefit from skilled therapeutic intervention in order to improve the following deficits and impairments:   Aphasia    Problem List Patient Active Problem List   Diagnosis Date Noted  . Fall 09/17/2017  . Hemiparesis of right dominant side as late effect of cerebral infarction ([place]) 09/17/2017  . Shoulder subluxation, right, initial encounter 09/17/2017  . Cardioembolic stroke ([place]) 80/32/1224  . Weakness 09/17/2017  . Aphasia 09/17/2017  . 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 Cardiomyopathy ([place]) 09/17/2017  . H/O ischemic left MCA stroke 09/17/2017  . Cardiac LV ejection fraction 21-30%   . Graves disease   .  Plaque psoriasis   . Neuropathy 05/19/2017   Deneise Lever, [place], [place] Speech-Language Pathologist  Aliene Altes 12/18/2017, 5:54 PM  [facility] [address] [place], [place], [address] Phone: [phone]   Fax:  [phone]   Name: Margaret Ortiz MRN: [id number] Date of Birth: 11-May-1961

## 2017-12-19 ENCOUNTER — Telehealth: Payer: Self-pay | Admitting: Rehabilitative and Restorative Service Providers"

## 2017-12-19 ENCOUNTER — Telehealth: Payer: Self-pay

## 2017-12-19 DIAGNOSIS — I69359 Hemiplegia and hemiparesis following cerebral infarction affecting unspecified side: Secondary | ICD-10-CM

## 2017-12-19 NOTE — Telephone Encounter (Signed)
Referral placed in Epic.  Thanks you for your help. Dr. Raoul Pitch

## 2017-12-19 NOTE — Telephone Encounter (Signed)
-----   Message from Cameron Sprang, MD sent at 12/19/2017  9:08 AM EDT ----- Pls let patient know cholesterol level looks great. Thanks

## 2017-12-19 NOTE — Telephone Encounter (Signed)
Dr. Raoul Pitch, Margaret Ortiz is being treated by physical therapy for R hemimotor impairments s/p CVA.  She will benefit from use of right AFO in order to improve safety with functional mobility.    If you agree, please submit request in EPIC under referral for DME (list right AFO in comments) or fax to [facility] at [phone].   Thank you, [place], [place] [facility] [address] [place], [place]  [address] Phone:  [phone] Fax:  [phone]

## 2017-12-19 NOTE — Telephone Encounter (Signed)
Called and LMOVM.  

## 2017-12-22 ENCOUNTER — Ambulatory Visit
Payer: BLUE CROSS/BLUE SHIELD | Attending: Family Medicine | Admitting: Rehabilitative and Restorative Service Providers"

## 2017-12-22 ENCOUNTER — Ambulatory Visit: Payer: BLUE CROSS/BLUE SHIELD | Admitting: Occupational Therapy

## 2017-12-22 ENCOUNTER — Ambulatory Visit: Payer: BLUE CROSS/BLUE SHIELD | Admitting: Speech Pathology

## 2017-12-22 ENCOUNTER — Encounter: Payer: Self-pay | Admitting: Rehabilitative and Restorative Service Providers"

## 2017-12-22 DIAGNOSIS — R29818 Other symptoms and signs involving the nervous system: Secondary | ICD-10-CM | POA: Diagnosis present

## 2017-12-22 DIAGNOSIS — R41842 Visuospatial deficit: Secondary | ICD-10-CM | POA: Diagnosis present

## 2017-12-22 DIAGNOSIS — R208 Other disturbances of skin sensation: Secondary | ICD-10-CM | POA: Diagnosis present

## 2017-12-22 DIAGNOSIS — I69351 Hemiplegia and hemiparesis following cerebral infarction affecting right dominant side: Secondary | ICD-10-CM

## 2017-12-22 DIAGNOSIS — M25511 Pain in right shoulder: Secondary | ICD-10-CM | POA: Diagnosis present

## 2017-12-22 DIAGNOSIS — R4701 Aphasia: Secondary | ICD-10-CM | POA: Insufficient documentation

## 2017-12-22 DIAGNOSIS — I69318 Other symptoms and signs involving cognitive functions following cerebral infarction: Secondary | ICD-10-CM | POA: Diagnosis present

## 2017-12-22 DIAGNOSIS — G8929 Other chronic pain: Secondary | ICD-10-CM | POA: Diagnosis present

## 2017-12-22 DIAGNOSIS — R2681 Unsteadiness on feet: Secondary | ICD-10-CM

## 2017-12-22 DIAGNOSIS — R41841 Cognitive communication deficit: Secondary | ICD-10-CM | POA: Insufficient documentation

## 2017-12-22 DIAGNOSIS — M6281 Muscle weakness (generalized): Secondary | ICD-10-CM

## 2017-12-22 DIAGNOSIS — R2689 Other abnormalities of gait and mobility: Secondary | ICD-10-CM

## 2017-12-22 DIAGNOSIS — R278 Other lack of coordination: Secondary | ICD-10-CM | POA: Insufficient documentation

## 2017-12-22 NOTE — Therapy (Signed)
[facility] [address] [place], [place], [address] Phone: [phone]   Fax:  [phone]  Occupational Therapy Treatment  Patient Details  Name: Margaret Ortiz MRN: [id number] Date of Birth: January 07, 1961 Referring Provider: Howard Pouch, DO   Encounter Date: 12/22/2017  OT End of Session - 12/22/17 1205    Visit Number  18    Number of Visits  24    Date for OT Re-Evaluation  12/27/17    Authorization Type  BC/BS    OT Start Time  1105    OT Stop Time  1145    OT Time Calculation (min)  40 min    Activity Tolerance  Patient tolerated treatment well    Behavior During Therapy  [facility] for tasks assessed/performed       Past Medical History:  Diagnosis Date  . 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  . Cardiac LV ejection fraction 21-30%   . Cardioembolic stroke ([place]) 25/85/2778   Right hemiparesis  . Cardiomyopathy ([place]) 04/2017  . Dysphagia 05/19/2017   Last known diet upgraded to regular diet with thin liquids on 06/27/2017, no records available  . Graves disease   . Hyperlipidemia   . Hypertension   . Neuropathy 05/19/2017   s/p stroke  . Plaque psoriasis     Past Surgical History:  Procedure Laterality Date  . NO PAST SURGERIES      There were no vitals filed for this visit.  Subjective Assessment - 12/22/17 1109    Pertinent History  Lt MCA CVA 05/19/2017, Graves dz    Patient Stated Goals  use my Rt arm    Currently in Pain?  No/denies                   OT Treatments/Exercises (OP) - 12/22/17 0001      Neurological Re-education Exercises   Other Exercises 2  AA/ROM RUE using tilted stool - min facilitation working through mid ranges for control     Other Weight-Bearing Exercises 1  BUE wt bearing for sit to low level squatting position followed by holding position w/ small wt shifts to Rt side,  then progressed to disengaging LUE. Seated: wt bearing over RUE with reaching and trunk rotation on Lt     Other Weight-Bearing Exercises 2  Wt bearing over elbows on K bench in tall kneeling with A/P wt shifts, Rt/Lt wt shifts, followed by disengaging LUE to increase wt over Rt side. Progressed to modified push ups over K bench in tall kneeling.  Seated: wt bearing over Rt elbow with arm on body movements.  Seated: wt bearing over Rt arm (elbow extended) w/ lateral trunk flexion while performing high level ipsilateral reaching LUE                OT Short Term Goals - 12/02/17 1623      OT SHORT TERM GOAL #1   Title  Pt/family independent with HEP for RUE - 11/27/17    Time  4    Period  Weeks    Status  Achieved Pt has HEP already previously issued (reviewed by outpatient therapist)       OT SHORT TERM GOAL #2   Title  Independent with splint wear and care for Rt hand prn    Time  4    Period  Weeks    Status  Achieved  OT SHORT TERM GOAL #3   Title  Pt/family to verbalize understanding with A/E and task modifications to increase indpendence with ADLS/IADLS (for bathing, tying shoes, cutting food, simple meal prep)    Time  4    Period  Weeks    Status  Achieved      OT SHORT TERM GOAL #4   Title  Pt to perform bathing with only min assist and A/E prn while seated     Time  4    Period  Weeks    Status  On-going      OT SHORT TERM GOAL #5   Title  Pt to make sandwich w/ only supervision/cues prn    Time  4    Period  Weeks    Status  Achieved however, needs modifications and/or A/E to be independent      OT SHORT TERM GOAL #6   Title  Pt to demo 25 degrees shoulder flexion in prep for low level reaching with gross finger flexion    Time  4    Period  Weeks    Status  On-going      OT SHORT TERM GOAL #7   Title  Pt/family to verbalize understanding with strategies to increase independence and safety with medication management including memory strategies prn      Time  4    Period  Weeks    Status  Deferred        OT Long Term Goals - 12/11/17 1217      OT LONG TERM GOAL #1   Title  Pt to perform shower transfers at mod I level w/ DME prn and no LOB - 12/27/17    Time  8    Period  Weeks    Status  On-going pt now agrees to work on this again      OT [place] #2   Title  Pt to perform simple meal prep at sup level using A/E and task modifications prn    Time  8    Period  Weeks    Status  On-going Pt now agrees to work on this again      OT LONG TERM GOAL #3   Title  Pt to perform light house management tasks at sup level including: laundry, washing dishes, cleaning, making bed    Time  8    Period  Weeks    Status  On-going Pt reports she is already washing dishes      OT LONG TERM GOAL #4   Title  Pt to demo 25% finger extension in prep for releasing objects Rt hand     Time  8    Period  Weeks    Status  On-going      OT LONG TERM GOAL #5   Title  Pt to perform environmental scanning while performing simple physical task at 90% accuracy    Time  8    Period  Weeks    Status  New      Long Term Additional Goals   Additional Long Term Goals  Yes      OT LONG TERM GOAL #6   Title  Pt to use Rt hand as stabalizer 25% of the time    Time  8    Period  Weeks    Status  New      OT LONG TERM GOAL #7   Title  Pt/family to verbalize understanding with neccessary accommodations, A/E,  etc that would be needed if pt is to return to living alone    Time  8    Period  Weeks    Status  New            Plan - 12/22/17 1205    Clinical Impression Statement  Pt making steady progress with control of RUE during closed chain activities    Occupational Profile and client history currently impacting functional performance  no significant PMH but current impairments extensive    Occupational performance deficits (Please refer to evaluation for details):  ADL's;IADL's;Work;Leisure;Social Participation    Rehab Potential  Fair     OT Frequency  3x / week    OT Duration  8 weeks    OT Treatment/Interventions  Self-care/ADL training;Moist Heat;DME and/or AE instruction;Splinting;Therapeutic activities;Psychosocial skills training;Aquatic Therapy;Cognitive remediation/compensation;Therapeutic exercise;Coping strategies training;Neuromuscular education;Functional Mobility Training;Passive range of motion;Visual/perceptual remediation/compensation;Electrical Stimulation;Manual Therapy;Patient/family education    Plan  continue NMR, Estim, renew end of this week    Consulted and Agree with Plan of Care  Patient       Patient will benefit from skilled therapeutic intervention in order to improve the following deficits and impairments:  Decreased coordination, Decreased range of motion, Difficulty walking, Improper body mechanics, Decreased endurance, Decreased safety awareness, Impaired sensation, Improper spinal/pelvic alignment, Impaired tone, Decreased knowledge of precautions, Decreased knowledge of use of DME, Decreased balance, Impaired UE functional use, Pain, Decreased cognition, Decreased mobility, Decreased strength, Impaired perceived functional ability, Impaired vision/preception  Visit Diagnosis: Hemiplegia and hemiparesis following cerebral infarction affecting right dominant side (HCC)  Other symptoms and signs involving the nervous system    Problem List Patient Active Problem List   Diagnosis Date Noted  . Fall 09/17/2017  . 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 Shoulder subluxation, right, initial encounter 09/17/2017  . Cardioembolic stroke ([place]) 96/75/9163  . Weakness 09/17/2017  . Aphasia 09/17/2017  . 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 Cardiomyopathy ([place]) 09/17/2017  . H/O ischemic left MCA stroke 09/17/2017  . Cardiac LV ejection fraction 21-30%   . Graves disease   . Plaque psoriasis   . Neuropathy 05/19/2017     Carey Bullocks, OTR/L 12/22/2017, 12:07 PM  [facility] [address] [place], [place], [address] Phone: [phone]   Fax:  [phone]  Name: Glada Wickstrom MRN: [id number] Date of Birth: 02/02/1961

## 2017-12-22 NOTE — Therapy (Signed)
[facility] [address] [place], [place], [address] Phone: [phone]   Fax:  [phone]  Physical Therapy Treatment  Patient Details  Name: Margaret Ortiz MRN: [id number] Date of Birth: 07-19-1960 Referring Provider: Howard Pouch, DO   Encounter Date: 12/22/2017  PT End of Session - 12/22/17 1512    Visit Number  14    Number of Visits  17    Date for PT Re-Evaluation  12/27/17    Authorization Type  BCBS     PT Start Time  1022    PT Stop Time  1103    PT Time Calculation (min)  41 min    Equipment Utilized During Treatment  -- min guard to S    Activity Tolerance  Patient tolerated treatment well    Behavior During Therapy  [facility] for tasks assessed/performed       Past Medical History:  Diagnosis Date  . 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  . Cardiac LV ejection fraction 21-30%   . Cardioembolic stroke ([place]) 69/62/9528   Right hemiparesis  . Cardiomyopathy ([place]) 04/2017  . Dysphagia 05/19/2017   Last known diet upgraded to regular diet with thin liquids on 06/27/2017, no records available  . Graves disease   . Hyperlipidemia   . Hypertension   . Neuropathy 05/19/2017   s/p stroke  . Plaque psoriasis     Past Surgical History:  Procedure Laterality Date  . NO PAST SURGERIES      There were no vitals filed for this visit.  Subjective Assessment - 12/22/17 1027    Subjective  PT and patient discussed schedule change for next week (to be present on 7/11 for Bioness @ 11-- and this correlates to speech therapy visit).     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      Currently in Pain?  No/denies         [facility] PT Assessment - 12/22/17 1046      Ambulation/Gait   Ambulation Distance (Feet)  115 Feet    Assistive device  Straight  cane;Small based quad cane;None    Gait velocity  0.98 ft/sec with SPC    Stairs  Yes    Stairs Assistance  6: Modified independent (Device/Increase time)    Stair Management Technique  One rail Left;Forwards;Step to pattern    Number of Stairs  4      Standardized Balance Assessment   Standardized Balance Assessment  Berg Balance Test      Berg Balance Test   Sit to Stand  Able to stand without using hands and stabilize independently    Standing Unsupported  Able to stand safely 2 minutes    Sitting with Back Unsupported but Feet Supported on Floor or Stool  Able to sit safely and securely 2 minutes    Stand to Sit  Sits safely with minimal use of hands    Transfers  Able to transfer safely, minor use of hands    Standing Unsupported with Eyes Closed  Able to stand 10 seconds safely    Standing Ubsupported with Feet Together  Able to place feet together independently and stand 1 minute safely    From Standing, Reach Forward with Outstretched Arm  Can reach forward >5 cm safely (2")    From Standing Position, Pick up Object  from Floor  Able to pick up shoe, needs supervision    From Standing Position, Turn to Look Behind Over each Shoulder  Looks behind from both sides and weight shifts well    Turn 360 Degrees  Needs close supervision or verbal cueing    Standing Unsupported, Alternately Place Feet on Step/Stool  Able to complete >2 steps/needs minimal assist    Standing Unsupported, One Foot in [place] to take small step independently and hold 30 seconds    Standing on One Leg  Tries to lift leg/unable to hold 3 seconds but remains standing independently    Total Score  42    Berg comment:  42/56                   [facility] Adult PT Treatment/Exercise - 12/22/17 1046      Ambulation/Gait   Ambulation/Gait  Yes    Ambulation/Gait Assistance  5: Supervision;4: Min guard    Ambulation/Gait Assistance Details  Patient ambulated with [facility] into clinic with supervision.  She  then ambulated with AFO donned and single point cane x 230 feet, 115 ft emphasizing reciprocal nature of gait pattern.  then ambulated without device with CGA to min A for tactile cues for right weight shift.  Attempted to introduce metronome for auditory cues, however patient unable to slow speed of L step due to dec'd right weight shifting.    Ambulation Surface  Level;Indoor      Neuro Re-ed    Neuro Re-ed Details   Standing NMR in parallel bars performing standing on 2" surface with heels over edge working on "hovering" above ground iwth heels without AFO donned.  Rocker board (without AFO) performing ankle DF/PF with tactile cues (min A) to weight shift to the right side.  Step downs from rocker board and back up with min A with R LE weight shifting.  Ambulating forward/backwards at slow pace for motor control.  Mini knee squats and sit<>stand with cues to shift to right side for NMR.                PT Short Term Goals - 12/04/17 1521      PT SHORT TERM GOAL #1   Title  The patient will perform HEP with cues from family for LE strength, standing balance, and motor control.    Baseline  Pt reports doing exercises at home - but only 3 exercises have been given to date - 12-02-17    Time  4    Period  Weeks    Status  Partially Met      PT SHORT TERM GOAL #2   Title  The patient will improve gait speed from 0.85 ft/sec to > or equal to 1.4 ft/sec to demonstrate transition from "household ambulator" to "limited community ambulator" classification of gait.    Status  Not Met      PT SHORT TERM GOAL #3   Title  The patient will improve Berg balance score from 32/56 to > or equal to 38/56 to demo dec'ing risk for falls.    Baseline  39/56 on 12/04/17    Time  4    Period  Weeks    Status  Achieved      PT SHORT TERM GOAL #4   Title  The patient will negotiate 4 steps with one rail and step to pattern mod indep.    Baseline  met 12-02-17    Status  Achieved  PT SHORT TERM GOAL  #5   Title  The patient will ambulate x 500 ft nonstop mod indep for improving access to community surfaces.    Baseline  1 seated rest period needed after (480-56)    Status  Partially Met        PT Long Term Goals - 12/22/17 1028      PT LONG TERM GOAL #1   Title  The patient will perform HEP with intermittent assist for post d/c progression of exercise.    Baseline  Patient is doing prescribed exercises and walking.      Time  8    Period  Weeks    Status  Achieved      PT LONG TERM GOAL #2   Title  The patient will move floor<>stand with mod indep using L UE support on surface due to h/o falls.    Time  8    Period  Weeks      PT LONG TERM GOAL #3   Title  The patient will improve gait speed from 0.85 ft/sec to > or equal to 1.8 ft/sec to demo dec'ing risk for falls.    Baseline  0.98 ft.sec on 12/22/2017    Time  8    Period  Weeks    Status  Partially Met      PT LONG TERM GOAL #4   Title  The patient will improve Berg score from 32/56 to > or equal to 44/56 to demo dec'ing risk for falls.    Baseline  42/56    Time  8    Period  Weeks    Status  Partially Met      PT LONG TERM GOAL #5   Title  The patient will negotiate 12 steps without a handrail with step to pattern mod indep to access home entries.    Baseline  one handrail mod indep with step to pattern.    Time  8    Period  Weeks    Status  Partially Met            Plan - 12/22/17 1517    Clinical Impression Statement  PT to finish checking LTGs at next session and modify goals x next 8 weeks.  Patient is making progress and PT is working with her to transition to [facility] in the home and remain on quad cane in community.  Patient continues with motor control limitations and communication challenges that limit carryover.     PT Treatment/Interventions  ADLs/Self Care Home Management;Gait training;Stair training;Functional mobility training;Therapeutic activities;Therapeutic exercise;Balance  training;Neuromuscular re-education;Patient/family education;Orthotic Fit/Training;Electrical Stimulation;Manual techniques;DME Instruction    PT Next Visit Plan  FINISH CHECKING LTGS. Marland KitchenSession with Gerald Stabs when available, Continue with Bioness (tablet 1) for both lower and upper unit RLE, R side strengthening, hip initiation during gait tasks, right LE loading/weight shifting.  gait training (in clinic without SBQC to encourage more reciprocal pattern),     Consulted and Agree with Plan of Care  Patient       Patient will benefit from skilled therapeutic intervention in order to improve the following deficits and impairments:  Abnormal gait, Impaired sensation, Pain, Postural dysfunction, Impaired tone, Decreased mobility, Decreased coordination, Decreased activity tolerance, Decreased endurance, Decreased strength, Difficulty walking, Decreased balance  Visit Diagnosis: Unsteadiness on feet  Other symptoms and signs involving the nervous system  Muscle weakness (generalized)  Other abnormalities of gait and mobility     Problem List Patient Active Problem List  Diagnosis Date Noted  . Fall 09/17/2017  . Hemiparesis of right dominant side as late effect of cerebral infarction ([place]) 09/17/2017  . Shoulder subluxation, right, initial encounter 09/17/2017  . Cardioembolic stroke ([place]) 59/47/0761  . Weakness 09/17/2017  . Aphasia 09/17/2017  . 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 Cardiomyopathy ([place]) 09/17/2017  . H/O ischemic left MCA stroke 09/17/2017  . Cardiac LV ejection fraction 21-30%   . Graves disease   . Plaque psoriasis   . Neuropathy 05/19/2017    Koree Schopf, PT 12/22/2017, 3:19 PM  [facility] [address] [place], [place], [address] Phone: [phone]   Fax:  [phone]  Name: Margaret Ortiz MRN: [id number] Date of Birth: [birth year]

## 2017-12-22 NOTE — Therapy (Signed)
[facility] [address] [place], [place], [address] Phone: [phone]   Fax:  [phone]  Speech Language Pathology Treatment  Patient Details  Name: Margaret Ortiz MRN: [id number] Date of Birth: 1960/10/06 Referring Provider: Ma Hillock, DO   Encounter Date: 12/22/2017  End of Session - 12/22/17 1314    Visit Number  15    Number of Visits  33    Date for SLP Re-Evaluation  02/20/18    Authorization Type  BCBS    SLP Start Time  1150    SLP Stop Time   1240    SLP Time Calculation (min)  50 min    Activity Tolerance  Patient tolerated treatment well       Past Medical History:  Diagnosis Date  . 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  . Cardiac LV ejection fraction 21-30%   . Cardioembolic stroke ([facility]) 16/96/7893   Right hemiparesis  . Cardiomyopathy ([facility]) 04/2017  . Dysphagia 05/19/2017   Last known diet upgraded to regular diet with thin liquids on 06/27/2017, no records available  . Graves disease   . Hyperlipidemia   . Hypertension   . Neuropathy 05/19/2017   s/p stroke  . Plaque psoriasis     Past Surgical History:  Procedure Laterality Date  . NO PAST SURGERIES      There were no vitals filed for this visit.  Subjective Assessment - 12/22/17 1307    Subjective  "I am look forward to it."    Currently in Pain?  No/denies            ADULT SLP TREATMENT - 12/22/17 1150      General Information   Behavior/Cognition  Alert;Cooperative;Pleasant mood      Treatment Provided   Treatment provided  Cognitive-Linquistic      Pain Assessment   Pain Assessment  No/denies pain      Cognitive-Linquistic Treatment   Treatment focused on  Aphasia;Other (comment) AAC    Skilled Treatment  Pt's Lingraphica device arrived; began device trial today. SLP demo'd turning device on/off and icon creation  including edit text, icon/photo selection. SLP created folders for therapies. Pt able to create new icons within folders, occasional min A. SLP customized content for OT, PT folders and check out procedure for improved communication during therapy. Pt used icons to communicate pain level (min verbal cue). Used at checkout with min visual/verbal cues to communicate with office staff.       Assessment / Recommendations / Plan   Plan  Continue with current plan of care;Goals updated      Progression Toward Goals   Progression toward goals  Progressing toward goals       SLP Education - 12/22/17 1314    Education provided  Yes    Education Details  Lingraphica device features/trial    Person(s) Educated  Patient    Methods  Explanation;Demonstration;Verbal cues    Comprehension  Verbalized understanding;Returned demonstration;Need further instruction       SLP Short Term Goals - 12/22/17 1322      SLP SHORT TERM GOAL #1   Title  n/a      SLP SHORT TERM GOAL #2   Title  n/a      SLP SHORT TERM GOAL #3   Title  n/a      SLP SHORT TERM GOAL #4   Title  n/a  SLP SHORT TERM GOAL #5   Title  n/a      Additional Short Term Goals   Additional Short Term Goals  Yes      SLP SHORT TERM GOAL #6   Title  Pt will reduce safety risks by communicating personal needs, requests for help, directives, using AAC if necessary, with occasional min A over 3 sessions.    Time  4    Period  Weeks    Status  New      SLP SHORT TERM GOAL #7   Title  Pt will increase communication effectiveness by answering questions in 90% of opportunities, using AAC if needed, over 3 sessions independently.    Time  4    Period  Weeks    Status  New      SLP SHORT TERM GOAL #8   Title  Pt will use multimodal communication/AAC when necessary in 5 minutes simple conversation with occasional min A over 3 sessions    Time  4    Period  Weeks    Status  New       SLP Long Term Goals - 12/22/17 1320       SLP LONG TERM GOAL #1   Title  Pt will participate functionally in 15 minutes simple-mod complex conversation with AAC for aphasia over 3 sessions.    Time  8    Period  Weeks    Status  Revised      SLP LONG TERM GOAL #2   Title  Pt will demo understanding of 10 minutes simple-mod complex conversation with compensations over 3 sessions    Time  8 renewed 12/22/17    Status  Revised      SLP LONG TERM GOAL #3   Title  Pt will demo error awareness by attempting correction or by nonverbal response to errors with 90% success    Time  8 renewed 12/22/17    Period  Weeks    Status  Revised      SLP LONG TERM GOAL #4   Title  Pt will use multimodal communication/AAC when necessary in 10 minutes simple-mod complex conversation with rare min A over 6 sessions    Time  8    Period  Weeks    Status  New      SLP LONG TERM GOAL #5   Title  pt will demo alternating attention between two simple cognitive-linguistic tasks for 85% accuracy with self correction/double checking answers (aphasia compensations allowed) over 3 sessions    Time  8    Period  Weeks    Status  New       Plan - 12/22/17 1318    Clinical Impression Statement  Pt continues to present with 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 Fluidity of simple conversation is improving; patient's awareness of errors deficient. Written keywords and drawings helpful for auditory comprehension. Paraphasias and neologisms persist affecting simple conversation, and aphasic written language has been observed today. 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 Lingraphica TouchTalk communication device trial begun today. Renewal completed 2x per week for 8 weeks; cognitive goals added. Continue skilled ST to maximize functional communication for independence and QOL.     Speech Therapy Frequency  2x / week     Duration  -- 8 weeks    Treatment/Interventions  Multimodal communcation approach;Compensatory strategies;Language facilitation;Compensatory techniques;Cueing hierarchy;Internal/external aids;Functional tasks;SLP instruction and  feedback;Patient/family education    Potential to Achieve Goals  Good    Potential Considerations  Severity of impairments    SLP Home Exercise Plan  Lingraphica device    Consulted and Agree with Plan of Care  Patient;Family member/caregiver       Patient will benefit from skilled therapeutic intervention in order to improve the following deficits and impairments:   Aphasia - Plan: SLP plan of care cert/re-cert  Cognitive communication deficit - Plan: SLP plan of care cert/re-cert    Problem List Patient Active Problem List   Diagnosis Date Noted  . Fall 09/17/2017  . Hemiparesis of right dominant side as late effect of cerebral infarction ([facility]) 09/17/2017  . Shoulder subluxation, right, initial encounter 09/17/2017  . Cardioembolic stroke ([facility]) 24/58/0998  . Weakness 09/17/2017  . Aphasia 09/17/2017  . 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 Cardiomyopathy ([facility]) 09/17/2017  . H/O ischemic left MCA stroke 09/17/2017  . Cardiac LV ejection fraction 21-30%   . Graves disease   . Plaque psoriasis   . Neuropathy 05/19/2017   Deneise Lever, [place], CCC-SLP Speech-Language Pathologist  Aliene Altes 12/22/2017, 1:35 PM  [facility] [address] [place], [place], [address] Phone: [phone]   Fax:  [phone]   Name: Margaret Ortiz MRN: [id number] Date of Birth: 1960/07/14

## 2017-12-23 ENCOUNTER — Ambulatory Visit: Payer: BLUE CROSS/BLUE SHIELD | Admitting: Occupational Therapy

## 2017-12-23 DIAGNOSIS — M6281 Muscle weakness (generalized): Secondary | ICD-10-CM

## 2017-12-23 DIAGNOSIS — R29818 Other symptoms and signs involving the nervous system: Secondary | ICD-10-CM

## 2017-12-23 DIAGNOSIS — I69351 Hemiplegia and hemiparesis following cerebral infarction affecting right dominant side: Secondary | ICD-10-CM

## 2017-12-23 DIAGNOSIS — R208 Other disturbances of skin sensation: Secondary | ICD-10-CM

## 2017-12-23 DIAGNOSIS — R2681 Unsteadiness on feet: Secondary | ICD-10-CM | POA: Diagnosis not present

## 2017-12-23 NOTE — Therapy (Signed)
[facility] [address] [place], [place], [address] Phone: [phone]   Fax:  [phone]  Occupational Therapy Treatment  Patient Details  Name: Margaret Ortiz MRN: [id number] Date of Birth: 21-May-1961 Referring Provider: Howard Pouch, DO   Encounter Date: 12/23/2017  OT End of Session - 12/23/17 1326    Visit Number  19    Number of Visits  24    Date for OT Re-Evaluation  12/27/17    Authorization Type  BC/BS    OT Start Time  1015    OT Stop Time  1100    OT Time Calculation (min)  45 min    Activity Tolerance  Patient tolerated treatment well    Behavior During Therapy  [facility] for tasks assessed/performed       Past Medical History:  Diagnosis Date  . 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  . Cardiac LV ejection fraction 21-30%   . Cardioembolic stroke ([facility]) 72/53/6644   Right hemiparesis  . Cardiomyopathy ([facility]) 04/2017  . Dysphagia 05/19/2017   Last known diet upgraded to regular diet with thin liquids on 06/27/2017, no records available  . Graves disease   . Hyperlipidemia   . Hypertension   . Neuropathy 05/19/2017   s/p stroke  . Plaque psoriasis     Past Surgical History:  Procedure Laterality Date  . NO PAST SURGERIES      There were no vitals filed for this visit.  Subjective Assessment - 12/23/17 1100    Subjective   What ex's do I need to be doing?     Pertinent History  Lt MCA CVA 05/19/2017, Graves dz    Patient Stated Goals  use my Rt arm    Currently in Pain?  No/denies                   OT Treatments/Exercises (OP) - 12/23/17 0001      Neurological Re-education Exercises   Other Exercises 1  Supine: chest press with PVC Frame with mod assist RUE. Elbow ext against gravity with mod assist and pt compensating with shoulder, but does demo trace elbow ext    Other Exercises 2  Seated:  AA/ROM in low range shoulder flexion (slides along transfer board) with mod assist, then in scaption ranges into gravity w/ min assist. Pt had questions about ex's but therapist reported she already has ex's from previous clinic, Plus the ex's Ive shown her with tilted stool and wt bearing seated and squatting over chair. Brother-n-law confirms she is doing these at home.       Acupuncturist Location  dorsal forearm    Electrical Stimulation Action  wrist and finger extension    Electrical Stimulation Parameters  50 pps, 250 pw, 10 sec. on/off cycle x 10 min      Splinting   Splinting  Pt issued D-ring wrist cock up splint for daytime as pt preferred over the wrist splint she already had. Reviewed wear and care of splint with pt and brother-n-law. Pt instructed to take off for all wt bearing ex's               OT Short Term Goals - 12/23/17 1327      OT SHORT TERM GOAL #1   Title  Pt/family independent with HEP for RUE - 11/27/17    Time  4  Period  Weeks    Status  Achieved Pt has HEP already previously issued (reviewed by outpatient therapist)       OT SHORT TERM GOAL #2   Title  Independent with splint wear and care for Rt hand prn    Time  4    Period  Weeks    Status  Achieved      OT SHORT TERM GOAL #3   Title  Pt/family to verbalize understanding with A/E and task modifications to increase indpendence with ADLS/IADLS (for bathing, tying shoes, cutting food, simple meal prep)    Time  4    Period  Weeks    Status  Achieved      OT SHORT TERM GOAL #4   Title  Pt to perform bathing with only min assist and A/E prn while seated     Time  4    Period  Weeks    Status  Achieved      OT SHORT TERM GOAL #5   Title  Pt to make sandwich w/ only supervision/cues prn    Time  4    Period  Weeks    Status  Achieved however, needs modifications and/or A/E to be independent      OT SHORT TERM GOAL #6   Title  Pt to demo 25 degrees  shoulder flexion in prep for low level reaching with gross finger flexion    Time  4    Period  Weeks    Status  On-going      OT SHORT TERM GOAL #7   Title  Pt/family to verbalize understanding with strategies to increase independence and safety with medication management including memory strategies prn     Time  4    Period  Weeks    Status  Deferred        OT Long Term Goals - 12/23/17 1327      OT LONG TERM GOAL #1   Title  Pt to perform shower transfers at mod I level w/ DME prn and no LOB - 12/27/17    Time  8    Period  Weeks    Status  On-going Pt requires supervision to min assist at this time secondary to not having grab bars, other A/E in shower yet      OT LONG TERM GOAL #2   Title  Pt to perform simple meal prep at sup level using A/E and task modifications prn    Time  8    Period  Weeks    Status  On-going Pt now agrees to work on this again      OT [place] #3   Title  Pt to perform light house management tasks at sup level including: laundry, washing dishes, cleaning, making bed    Time  8    Period  Weeks    Status  On-going Pt reports she is already washing dishes      OT LONG TERM GOAL #4   Title  Pt to demo 25% finger extension in prep for releasing objects Rt hand     Time  8    Period  Weeks    Status  On-going      OT LONG TERM GOAL #5   Title  Pt to perform environmental scanning while performing simple physical task at 90% accuracy    Time  8    Period  Weeks    Status  New  OT LONG TERM GOAL #6   Title  Pt to use Rt hand as stabalizer 25% of the time    Time  8    Period  Weeks    Status  On-going      OT LONG TERM GOAL #7   Title  Pt/family to verbalize understanding with neccessary accommodations, A/E, etc that would be needed if pt is to return to living alone    Time  8    Period  Weeks    Status  New            Plan - 12/23/17 1329    Clinical Impression Statement  Pt making steady progress with control of RUE  during closed chain activities    Occupational Profile and client history currently impacting functional performance  no significant PMH but current impairments extensive    Occupational performance deficits (Please refer to evaluation for details):  ADL's;IADL's;Work;Leisure;Social Participation    Rehab Potential  Fair    OT Frequency  3x / week    OT Duration  8 weeks    OT Treatment/Interventions  Self-care/ADL training;Moist Heat;DME and/or AE instruction;Splinting;Therapeutic activities;Psychosocial skills training;Aquatic Therapy;Cognitive remediation/compensation;Therapeutic exercise;Coping strategies training;Neuromuscular education;Functional Mobility Training;Passive range of motion;Visual/perceptual remediation/compensation;Electrical Stimulation;Manual Therapy;Patient/family education    Plan  continue NMR, renew next visit    Consulted and Agree with Plan of Care  Patient    Family Member Consulted  brother-n-law        Patient will benefit from skilled therapeutic intervention in order to improve the following deficits and impairments:  Decreased coordination, Decreased range of motion, Difficulty walking, Improper body mechanics, Decreased endurance, Decreased safety awareness, Impaired sensation, Improper spinal/pelvic alignment, Impaired tone, Decreased knowledge of precautions, Decreased knowledge of use of DME, Decreased balance, Impaired UE functional use, Pain, Decreased cognition, Decreased mobility, Decreased strength, Impaired perceived functional ability, Impaired vision/preception  Visit Diagnosis: Hemiplegia and hemiparesis following cerebral infarction affecting right dominant side (HCC)  Other symptoms and signs involving the nervous system  Muscle weakness (generalized)  Other disturbances of skin sensation    Problem List Patient Active Problem List   Diagnosis Date Noted  . Fall 09/17/2017  . Hemiparesis of right dominant side as late effect of cerebral  infarction ([facility]) 09/17/2017  . Shoulder subluxation, right, initial encounter 09/17/2017  . Cardioembolic stroke ([facility]) 48/88/9169  . Weakness 09/17/2017  . Aphasia 09/17/2017  . 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 Cardiomyopathy ([facility]) 09/17/2017  . H/O ischemic left MCA stroke 09/17/2017  . Cardiac LV ejection fraction 21-30%   . Graves disease   . Plaque psoriasis   . Neuropathy 05/19/2017    Carey Bullocks, OTR/L 12/23/2017, 1:30 PM  [facility] [address] [place], [place], [address] Phone: [phone]   Fax:  [phone]  Name: Margaret Ortiz MRN: [id number] Date of Birth: Nov 03, 1960

## 2017-12-24 ENCOUNTER — Encounter: Payer: Self-pay | Admitting: Rehabilitative and Restorative Service Providers"

## 2017-12-24 ENCOUNTER — Ambulatory Visit: Payer: BLUE CROSS/BLUE SHIELD | Admitting: Rehabilitative and Restorative Service Providers"

## 2017-12-24 ENCOUNTER — Ambulatory Visit: Payer: BLUE CROSS/BLUE SHIELD | Admitting: Occupational Therapy

## 2017-12-24 ENCOUNTER — Ambulatory Visit: Payer: BLUE CROSS/BLUE SHIELD

## 2017-12-24 DIAGNOSIS — R2681 Unsteadiness on feet: Secondary | ICD-10-CM | POA: Diagnosis not present

## 2017-12-24 DIAGNOSIS — M6281 Muscle weakness (generalized): Secondary | ICD-10-CM

## 2017-12-24 DIAGNOSIS — R41842 Visuospatial deficit: Secondary | ICD-10-CM

## 2017-12-24 DIAGNOSIS — R2689 Other abnormalities of gait and mobility: Secondary | ICD-10-CM

## 2017-12-24 DIAGNOSIS — R29818 Other symptoms and signs involving the nervous system: Secondary | ICD-10-CM

## 2017-12-24 DIAGNOSIS — R208 Other disturbances of skin sensation: Secondary | ICD-10-CM

## 2017-12-24 DIAGNOSIS — R278 Other lack of coordination: Secondary | ICD-10-CM

## 2017-12-24 DIAGNOSIS — R4701 Aphasia: Secondary | ICD-10-CM

## 2017-12-24 DIAGNOSIS — R41841 Cognitive communication deficit: Secondary | ICD-10-CM

## 2017-12-24 DIAGNOSIS — I69318 Other symptoms and signs involving cognitive functions following cerebral infarction: Secondary | ICD-10-CM

## 2017-12-24 DIAGNOSIS — M25511 Pain in right shoulder: Secondary | ICD-10-CM

## 2017-12-24 DIAGNOSIS — G8929 Other chronic pain: Secondary | ICD-10-CM

## 2017-12-24 DIAGNOSIS — I69351 Hemiplegia and hemiparesis following cerebral infarction affecting right dominant side: Secondary | ICD-10-CM

## 2017-12-24 NOTE — Therapy (Signed)
[facility] [address] [place], [place], [address] Phone: [phone]   Fax:  [phone]  Patient Details  Name: Videl Nobrega MRN: [id number] Date of Birth: 03/14/1961 Referring Provider:  No ref. provider found    *[facility] to attend visit on 7/18 @ 3:30 for AFO evaluation.    Zoriyah Scheidegger 12/24/2017, 9:10 AM  [facility] [address] [place], [place], [address] Phone: [phone]   Fax:  [phone]

## 2017-12-24 NOTE — Patient Instructions (Signed)
  Please complete the assigned speech therapy homework prior to your next session and return it to the speech therapist at your next visit.  

## 2017-12-24 NOTE — Therapy (Signed)
[facility] [address] [place], [place], [address] Phone: [phone]   Fax:  [phone]  Physical Therapy Treatment  Patient Details  Name: Margaret Ortiz MRN: [id number] Date of Birth: 05-28-1961 Referring Provider: Howard Pouch, DO   Encounter Date: 12/24/2017  PT End of Session - 12/24/17 1426    Visit Number  15    Number of Visits  17    Date for PT Re-Evaluation  12/27/17    Authorization Type  BCBS     PT Start Time  1015    PT Stop Time  1102    PT Time Calculation (min)  47 min    Equipment Utilized During Treatment  -- min guard to S    Activity Tolerance  Patient tolerated treatment well    Behavior During Therapy  [facility] for tasks assessed/performed       Past Medical History:  Diagnosis Date  . 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  . Cardiac LV ejection fraction 21-30%   . Cardioembolic stroke ([facility]) 35/57/3220   Right hemiparesis  . Cardiomyopathy ([facility]) 04/2017  . Dysphagia 05/19/2017   Last known diet upgraded to regular diet with thin liquids on 06/27/2017, no records available  . Graves disease   . Hyperlipidemia   . Hypertension   . Neuropathy 05/19/2017   s/p stroke  . Plaque psoriasis     Past Surgical History:  Procedure Laterality Date  . NO PAST SURGERIES      There were no vitals filed for this visit.  Subjective Assessment - 12/24/17 1020    Subjective  The patient notes she is walking with SPC at home.                        [facility] Adult PT Treatment/Exercise - 12/24/17 2043      Ambulation/Gait   Ambulation/Gait  Yes    Ambulation/Gait Assistance  5: Supervision;4: Min guard    Ambulation/Gait Assistance Details  Patient ambulated into clinic wit [facility] with supervision. Utilized ace wrap and tactile cues for weight shift with CGA walking without AFO for motor learning.     Ambulation Distance (Feet)  230 Feet x 2    Assistive device  Small based quad cane;Straight cane    Ambulation Surface  Level;Indoor      Neuro Re-ed    Neuro Re-ed Details   Performed activiites without AFO donned for motor control.  With min A, worked in tall kneeling with UEs supported on bench.  Performed weight shifting R/L and moving from tall<>heel sitting>tall kneeling, then performed tall kneel to 1/2 kneeling with min A.  Worked on "march" in tall kneel to work on Technical sales engineer in hip.  Performed moving R LE forward/backwards with min A and tactile cues at trunk for elonation and approximation of R shoulder.    Sit>stand without AFO with PT providing cues for right weight shifting,       Exercises   Other Exercises   Seated hamstring activation sliding foot along sliding board with tactile cues.               PT Short Term Goals - 12/04/17 1521      PT SHORT TERM GOAL #1   Title  The patient will perform HEP with cues from family for LE strength, standing balance, and motor control.    Baseline  Pt reports doing exercises at home - but only 3 exercises have been given to date - 12-02-17    Time  4    Period  Weeks    Status  Partially Met      PT SHORT TERM GOAL #2   Title  The patient will improve gait speed from 0.85 ft/sec to > or equal to 1.4 ft/sec to demonstrate transition from "household ambulator" to "limited community ambulator" classification of gait.    Status  Not Met      PT SHORT TERM GOAL #3   Title  The patient will improve Berg balance score from 32/56 to > or equal to 38/56 to demo dec'ing risk for falls.    Baseline  39/56 on 12/04/17    Time  4    Period  Weeks    Status  Achieved      PT SHORT TERM GOAL #4   Title  The patient will negotiate 4 steps with one rail and step to pattern mod indep.    Baseline  met 12-02-17    Status  Achieved      PT SHORT TERM GOAL #5   Title  The patient will ambulate x 500 ft nonstop mod indep for  improving access to community surfaces.    Baseline  1 seated rest period needed after (480-56)    Status  Partially Met        PT Long Term Goals - 12/22/17 1028      PT LONG TERM GOAL #1   Title  The patient will perform HEP with intermittent assist for post d/c progression of exercise.    Baseline  Patient is doing prescribed exercises and walking.      Time  8    Period  Weeks    Status  Achieved      PT LONG TERM GOAL #2   Title  The patient will move floor<>stand with mod indep using L UE support on surface due to h/o falls.    Time  8    Period  Weeks      PT LONG TERM GOAL #3   Title  The patient will improve gait speed from 0.85 ft/sec to > or equal to 1.8 ft/sec to demo dec'ing risk for falls.    Baseline  0.98 ft.sec on 12/22/2017    Time  8    Period  Weeks    Status  Partially Met      PT LONG TERM GOAL #4   Title  The patient will improve Berg score from 32/56 to > or equal to 44/56 to demo dec'ing risk for falls.    Baseline  42/56    Time  8    Period  Weeks    Status  Partially Met      PT LONG TERM GOAL #5   Title  The patient will negotiate 12 steps without a handrail with step to pattern mod indep to access home entries.    Baseline  one handrail mod indep with step to pattern.    Time  8    Period  Weeks    Status  Partially Met            Plan - 12/24/17 2055    Clinical Impression Statement  The patient is continuing to make progress with motor activation of the right LE and trunk.  PT to finish checking LTGs next visit and renew x 8 more weeks.  PT Treatment/Interventions  ADLs/Self Care Home Management;Gait training;Stair training;Functional mobility training;Therapeutic activities;Therapeutic exercise;Balance training;Neuromuscular re-education;Patient/family education;Orthotic Fit/Training;Electrical Stimulation;Manual techniques;DME Instruction    PT Next Visit Plan  FINISH CHECKING LTGS. Session with [facility] O/P 7/18 @ 3:30. Continue  with Bioness (tablet 1) for both lower and upper unit RLE, R side strengthening, hip initiation during gait tasks, right LE loading/weight shifting.  gait training (in clinic without SBQC to encourage more reciprocal pattern),     Consulted and Agree with Plan of Care  Patient       Patient will benefit from skilled therapeutic intervention in order to improve the following deficits and impairments:  Abnormal gait, Impaired sensation, Pain, Postural dysfunction, Impaired tone, Decreased mobility, Decreased coordination, Decreased activity tolerance, Decreased endurance, Decreased strength, Difficulty walking, Decreased balance  Visit Diagnosis: Other symptoms and signs involving the nervous system  Muscle weakness (generalized)  Other abnormalities of gait and mobility  Unsteadiness on feet     Problem List Patient Active Problem List   Diagnosis Date Noted  . Fall 09/17/2017  . Hemiparesis of right dominant side as late effect of cerebral infarction ([facility]) 09/17/2017  . Shoulder subluxation, right, initial encounter 09/17/2017  . Cardioembolic stroke ([place] [facility]) 33/83/2919  . Weakness 09/17/2017  . Aphasia 09/17/2017  . 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 Cardiomyopathy ([facility]) 09/17/2017  . H/O ischemic left MCA stroke 09/17/2017  . Cardiac LV ejection fraction 21-30%   . Graves disease   . Plaque psoriasis   . Neuropathy 05/19/2017    Imer Foxworth, PT 12/24/2017, 8:59 PM  [facility] [address] [place], [place], [address] Phone: [phone]   Fax:  [phone]  Name: Margaret Ortiz MRN: [id number] Date of Birth: [birth year]

## 2017-12-24 NOTE — Therapy (Signed)
[facility] [address] [place], [place], [address] Phone: [phone]   Fax:  [phone]  Speech Language Pathology Treatment  Patient Details  Name: Sanoe Hazan MRN: [id number] Date of Birth: [birth year] Referring Provider: Ma Hillock, DO   Encounter Date: 12/24/2017  End of Session - 12/24/17 1707    Visit Number  16    Number of Visits  33    Date for SLP Re-Evaluation  02/20/18    SLP Start Time  1148    SLP Stop Time   1230    SLP Time Calculation (min)  42 min    Activity Tolerance  Patient tolerated treatment well       Past Medical History:  Diagnosis Date  . 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  . Cardiac LV ejection fraction 21-30%   . Cardioembolic stroke ([place]) 32/67/1245   Right hemiparesis  . Cardiomyopathy ([place]) 04/2017  . Dysphagia 05/19/2017   Last known diet upgraded to regular diet with thin liquids on 06/27/2017, no records available  . Graves disease   . Hyperlipidemia   . Hypertension   . Neuropathy 05/19/2017   s/p stroke  . Plaque psoriasis     Past Surgical History:  Procedure Laterality Date  . NO PAST SURGERIES      There were no vitals filed for this visit.  Subjective Assessment - 12/24/17 1223    Subjective  "Thank you."    Currently in Pain?  No/denies            ADULT SLP TREATMENT - 12/24/17 1223      General Information   Behavior/Cognition  Alert;Cooperative;Pleasant mood      Treatment Provided   Treatment provided  Cognitive-Linquistic      Cognitive-Linquistic Treatment   Treatment focused on  Aphasia    Skilled Treatment  Pt left her speech generating device (SGD) at home today. She only had one of many forms filled out for SGD. SLP told her to have STeven assist her. Pt agreed this was a good idea. In simple conversation of 6 minutes, pt conversation  was functional with repeat requests from SLP, questioning cues, and pt slowing he speech rate with >2 pt attempts at correcting misunderstandings by SLP. These instances where pt reduced her rate were successful at pt conveyance of message. Accuracy of pt langauge was targeted in divergent naming task, in which pt had approx 70% success at the word level. 25% of the time pt used description or repeated her response if she sensed SLP did not understand. These compensations were successful 50% of the time.      Assessment / Recommendations / Plan   Plan  Continue with current plan of care;Goals updated      Progression Toward Goals   Progression toward goals  Progressing toward goals         SLP Short Term Goals - 12/24/17 1710      SLP SHORT TERM GOAL #1   Title  n/a      SLP SHORT TERM GOAL #2   Title  n/a      SLP SHORT TERM GOAL #3   Title  n/a      SLP SHORT TERM GOAL #4   Title  n/a      SLP SHORT TERM GOAL #5   Title  n/a      SLP SHORT TERM  GOAL #6   Title  Pt will reduce safety risks by communicating personal needs, requests for help, directives, using AAC if necessary, with occasional min A over 3 sessions.    Time  4    Period  Weeks    Status  On-going      SLP SHORT TERM GOAL #7   Title  Pt will increase communication effectiveness by answering questions in 90% of opportunities, using AAC if needed, over 3 sessions independently.    Time  4    Period  Weeks    Status  On-going      SLP SHORT TERM GOAL #8   Title  Pt will use multimodal communication/AAC when necessary in 5 minutes simple conversation with occasional min A over 3 sessions    Time  4    Period  Weeks    Status  On-going       SLP Long Term Goals - 12/24/17 1710      SLP LONG TERM GOAL #1   Title  Pt will participate functionally in 15 minutes simple-mod complex conversation with AAC for aphasia over 3 sessions.    Time  8    Period  Weeks    Status  On-going      SLP LONG TERM GOAL #2    Title  Pt will demo understanding of 10 minutes simple-mod complex conversation with compensations over 3 sessions    Time  8 renewed 12/22/17    Status  On-going      SLP LONG TERM GOAL #3   Title  Pt will demo error awareness by attempting correction or by nonverbal response to errors with 90% success    Time  8 renewed 12/22/17    Period  Weeks    Status  On-going      SLP LONG TERM GOAL #4   Title  Pt will use multimodal communication/AAC when necessary in 10 minutes simple-mod complex conversation with rare min A over 6 sessions    Time  8    Period  Weeks    Status  On-going      SLP LONG TERM GOAL #5   Title  pt will demo alternating attention between two simple cognitive-linguistic tasks for 85% accuracy with self correction/double checking answers (aphasia compensations allowed) over 3 sessions    Time  8    Period  Weeks    Status  On-going       Plan - 12/24/17 1708    Clinical Impression Statement  Pt continues to present with 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 Fluidity of simple conversation is improving; today in 6 minutes simple conversaiton pt was functional with mod A from SLP. Patient's awareness of errors appeared slightly improved, demonstrated by her attempts at correction. Paraphasias and neologisms persist affecting expressive verbal output today. Continue skilled ST to maximize functional communication for independence and QOL.     Speech Therapy Frequency  2x / week    Treatment/Interventions  Multimodal communcation approach;Compensatory strategies;Language facilitation;Compensatory techniques;Cueing hierarchy;Internal/external aids;Functional tasks;SLP instruction and feedback;Patient/family education    Potential to Achieve Goals  Good    Potential Considerations  Severity of impairments    SLP Home Exercise Plan  provided    Consulted and Agree with Plan of Care  Patient       Patient will benefit from skilled therapeutic  intervention in order to improve the following deficits and impairments:   Aphasia  Cognitive communication deficit    Problem  List Patient Active Problem List   Diagnosis Date Noted  . Fall 09/17/2017  . Hemiparesis of right dominant side as late effect of cerebral infarction ([facility]) 09/17/2017  . Shoulder subluxation, right, initial encounter 09/17/2017  . Cardioembolic stroke ([place] [facility]) 33/00/7622  . Weakness 09/17/2017  . Aphasia 09/17/2017  . 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 Cardiomyopathy ([place]) 09/17/2017  . H/O ischemic left MCA stroke 09/17/2017  . Cardiac LV ejection fraction 21-30%   . Graves disease   . Plaque psoriasis   . Neuropathy 05/19/2017    [facility] ,[place], CCC-SLP  12/24/2017, 5:11 PM  [facility] [address] [place], [place], [address] Phone: [phone]   Fax:  [phone]   Name: Geralene Afshar MRN: [id number] Date of Birth: 1961/02/06

## 2017-12-24 NOTE — Therapy (Signed)
[facility] [address] [place], [place], [address] Phone: [phone]   Fax:  [phone]  Occupational Therapy Treatment  Patient Details  Name: Margaret Ortiz MRN: [id number] Date of Birth: 26-Oct-1960 Referring Provider: Howard Pouch, DO   Encounter Date: 12/24/2017  OT End of Session - 12/24/17 1138    Visit Number  20    Number of Visits  36    Date for OT Re-Evaluation  02/20/18    Authorization Type  BC/BS    OT Start Time  1100    OT Stop Time  1150    OT Time Calculation (min)  50 min    Activity Tolerance  Patient tolerated treatment well    Behavior During Therapy  [facility] for tasks assessed/performed       Past Medical History:  Diagnosis Date  . 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  . Cardiac LV ejection fraction 21-30%   . Cardioembolic stroke ([facility]) 96/78/9381   Right hemiparesis  . Cardiomyopathy ([place]) 04/2017  . Dysphagia 05/19/2017   Last known diet upgraded to regular diet with thin liquids on 06/27/2017, no records available  . Graves disease   . Hyperlipidemia   . Hypertension   . Neuropathy 05/19/2017   s/p stroke  . Plaque psoriasis     Past Surgical History:  Procedure Laterality Date  . NO PAST SURGERIES      There were no vitals filed for this visit.  Subjective Assessment - 12/24/17 1108    Subjective   Responded "yes" to feeling light headed    Pertinent History  Lt MCA CVA 05/19/2017, Graves dz    Patient Stated Goals  use my Rt arm    Currently in Pain?  No/denies                   OT Treatments/Exercises (OP) - 12/24/17 0001      ADLs   ADL Comments  Pt became light headed, flushed in color, and needed to lay down at end of session. Therapist removed estim unit and layed pt down. BP 117/75, HR = 74, O2 sats = 91%. Rechecked BP with manual cuff which was 128/86. As pt  started to feel better, helped pt sit up and rechecked BP sitting up with manual cuff: 124/84. Pt wanted to continue with speech therapy. O.T. informed speech therapist and had him continue to monitor pt. O.T. discussed event with family when they arrived.       Neurological Re-education Exercises   Other Exercises 1  AA/ROM (using long flexible PVC) for scapula depression and elbow extension with mod assist, followed by AA/ROM in sh. flexion w/ mod assist.     Other Exercises 2  Facilitating ER followed by active elbow extension in low range abduction using UE Ranger, requiring max assist      Electrical Stimulation   Electrical Stimulation Location  dorsal forearm    Electrical Stimulation Action  wrist and finger extension    Electrical Stimulation Parameters  50 pps, 250 pw, 10 sec. on/off cycle, ramp 2, x 10 minutes               OT Short Term Goals - 12/24/17 1247      OT SHORT TERM GOAL #6   Title  Pt to demo 25 degrees shoulder flexion in prep for low level reaching with gross finger flexion  Time  4    Period  Weeks    Status Ongoing     OT SHORT TERM GOAL #7   Title  Pt to perform environmental scanning at 90% accuracy during ambulation    Time  4    Period  Weeks    Status  New        OT Long Term Goals - 12/24/17 1248      OT LONG TERM GOAL #1   Title  Pt to perform shower transfers at mod I level w/ DME prn and no LOB - 12/27/17    Time  8    Period  Weeks    Status  On-going Pt requires supervision to min assist at this time secondary to not having grab bars, other A/E in shower yet      OT LONG TERM GOAL #2   Title  Pt to perform simple meal prep at sup level using A/E and task modifications prn    Time  8    Period  Weeks    Status  On-going Pt now agrees to work on this again      OT [place] #3   Title  Pt to perform light house management tasks at sup level including: laundry, washing dishes, cleaning, making bed    Time  8    Period   Weeks    Status  On-going Pt reports she is already washing dishes      OT LONG TERM GOAL #4   Title  Pt to demo 25% finger extension in prep for releasing objects Rt hand     Time  8    Period  Weeks    Status  On-going      OT LONG TERM GOAL #5   Title  (LTG #5 moved to STG for renewal period)      OT LONG TERM GOAL #6   Title  Pt to use Rt hand as stabalizer 25% of the time    Time  8    Period  Weeks    Status  On-going      OT LONG TERM GOAL #7   Title  Pt/family to verbalize understanding with neccessary accommodations, A/E, etc that would be needed if pt is to return to living alone    Time  8    Period  Weeks    Status  New            Plan - 12/24/17 1243    Clinical Impression Statement  Pt making steady progress with control of RUE during closed chain activities. Pt has no functional movement of RUE however. Renewal completed today - Pt has met original STG's #1-5. Continue to work on [employer] and #7, and all remaining unmet LTG's for renewal period.     Occupational Profile and client history currently impacting functional performance  no significant PMH but current impairments extensive    Occupational performance deficits (Please refer to evaluation for details):  ADL's;IADL's;Work;Leisure;Social Participation    Rehab Potential  Fair    Current Impairments/barriers affecting progress:  severity of deficits    OT Frequency  3x / week    OT Duration  8 weeks then 2x/wk for 8 more weeks beginning next week for renewal period    OT Treatment/Interventions  Self-care/ADL training;Moist Heat;DME and/or AE instruction;Splinting;Therapeutic activities;Psychosocial skills training;Aquatic Therapy;Cognitive remediation/compensation;Therapeutic exercise;Coping strategies training;Neuromuscular education;Functional Mobility Training;Passive range of motion;Visual/perceptual remediation/compensation;Electrical Stimulation;Manual Therapy;Patient/family education    Plan  Renewal completed today - progress per updated POC. Will reduce to 2x/wk for 8 weeks beginning next week    Consulted and Agree with Plan of Care  Patient       Patient will benefit from skilled therapeutic intervention in order to improve the following deficits and impairments:  Decreased coordination, Decreased range of motion, Difficulty walking, Improper body mechanics, Decreased endurance, Decreased safety awareness, Impaired sensation, Improper spinal/pelvic alignment, Impaired tone, Decreased knowledge of precautions, Decreased knowledge of use of DME, Decreased balance, Impaired UE functional use, Pain, Decreased cognition, Decreased mobility, Decreased strength, Impaired perceived functional ability, Impaired vision/preception  Visit Diagnosis: Hemiplegia and hemiparesis following cerebral infarction affecting right dominant side ([place]) - Plan: Ot plan of care cert/re-cert  Other symptoms and signs involving the nervous system - Plan: Ot plan of care cert/re-cert  Muscle weakness (generalized) - Plan: Ot plan of care cert/re-cert  Other disturbances of skin sensation - Plan: Ot plan of care cert/re-cert  Unsteadiness on feet - Plan: Ot plan of care cert/re-cert  Chronic right shoulder pain - Plan: Ot plan of care cert/re-cert  Visuospatial deficit - Plan: Ot plan of care cert/re-cert  Other lack of coordination - Plan: Ot plan of care cert/re-cert  Other symptoms and signs involving cognitive functions following cerebral infarction - Plan: Ot plan of care cert/re-cert    Problem List Patient Active Problem List   Diagnosis Date Noted  . Fall 09/17/2017  . Hemiparesis of right dominant side as late effect of cerebral infarction ([place]) 09/17/2017  . Shoulder subluxation, right, initial encounter 09/17/2017  . Cardioembolic stroke ([place]) 17/91/5056  . Weakness 09/17/2017  . Aphasia 09/17/2017  . 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 Cardiomyopathy ([place])  09/17/2017  . H/O ischemic left MCA stroke 09/17/2017  . Cardiac LV ejection fraction 21-30%   . Graves disease   . Plaque psoriasis   . Neuropathy 05/19/2017    Carey Bullocks, OTR/L 12/24/2017, 1:04 PM  [facility] [address] [place], [place], [address] Phone: [phone]   Fax:  [phone]  Name: Margaret Ortiz MRN: [id number] Date of Birth: Jul 27, 1960

## 2017-12-29 ENCOUNTER — Ambulatory Visit: Payer: BLUE CROSS/BLUE SHIELD | Admitting: Occupational Therapy

## 2017-12-29 ENCOUNTER — Encounter: Payer: Self-pay | Admitting: Occupational Therapy

## 2017-12-29 ENCOUNTER — Encounter: Payer: Self-pay | Admitting: Rehabilitative and Restorative Service Providers"

## 2017-12-29 ENCOUNTER — Ambulatory Visit: Payer: BLUE CROSS/BLUE SHIELD | Admitting: Rehabilitative and Restorative Service Providers"

## 2017-12-29 ENCOUNTER — Ambulatory Visit: Payer: BLUE CROSS/BLUE SHIELD

## 2017-12-29 DIAGNOSIS — M6281 Muscle weakness (generalized): Secondary | ICD-10-CM

## 2017-12-29 DIAGNOSIS — I69351 Hemiplegia and hemiparesis following cerebral infarction affecting right dominant side: Secondary | ICD-10-CM

## 2017-12-29 DIAGNOSIS — R4701 Aphasia: Secondary | ICD-10-CM

## 2017-12-29 DIAGNOSIS — G8929 Other chronic pain: Secondary | ICD-10-CM

## 2017-12-29 DIAGNOSIS — R29818 Other symptoms and signs involving the nervous system: Secondary | ICD-10-CM

## 2017-12-29 DIAGNOSIS — R278 Other lack of coordination: Secondary | ICD-10-CM

## 2017-12-29 DIAGNOSIS — I69318 Other symptoms and signs involving cognitive functions following cerebral infarction: Secondary | ICD-10-CM

## 2017-12-29 DIAGNOSIS — R2689 Other abnormalities of gait and mobility: Secondary | ICD-10-CM

## 2017-12-29 DIAGNOSIS — R2681 Unsteadiness on feet: Secondary | ICD-10-CM

## 2017-12-29 DIAGNOSIS — M25511 Pain in right shoulder: Secondary | ICD-10-CM

## 2017-12-29 DIAGNOSIS — R41842 Visuospatial deficit: Secondary | ICD-10-CM

## 2017-12-29 DIAGNOSIS — R208 Other disturbances of skin sensation: Secondary | ICD-10-CM

## 2017-12-29 DIAGNOSIS — R41841 Cognitive communication deficit: Secondary | ICD-10-CM

## 2017-12-29 NOTE — Therapy (Signed)
[facility] [address] [place], [place], [address] Phone: [phone]   Fax:  [phone]  Occupational Therapy Treatment  Patient Details  Name: Margaret Ortiz MRN: [id number] Date of Birth: 1961-06-04 Referring Provider: Howard Pouch, DO   Encounter Date: 12/29/2017  OT End of Session - 12/29/17 1005    Visit Number  21    Number of Visits  36    Date for OT Re-Evaluation  02/20/18    Authorization Type  BC/BS    OT Start Time  0846    OT Stop Time  0932    OT Time Calculation (min)  46 min    Activity Tolerance  Patient tolerated treatment well       Past Medical History:  Diagnosis Date  . 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  . Cardiac LV ejection fraction 21-30%   . Cardioembolic stroke ([place]) 69/62/9528   Right hemiparesis  . Cardiomyopathy ([place]) 04/2017  . Dysphagia 05/19/2017   Last known diet upgraded to regular diet with thin liquids on 06/27/2017, no records available  . Graves disease   . Hyperlipidemia   . Hypertension   . Neuropathy 05/19/2017   s/p stroke  . Plaque psoriasis     Past Surgical History:  Procedure Laterality Date  . NO PAST SURGERIES      There were no vitals filed for this visit.  Subjective Assessment - 12/29/17 0851    Subjective   I feel a stretch (pointing to RUE at elbow when in weightbearing)    Pertinent History  Lt MCA CVA 05/19/2017, Graves dz    Patient Stated Goals  use my Rt arm    Currently in Pain?  No/denies                   OT Treatments/Exercises (OP) - 12/29/17 0001      Neurological Re-education Exercises   Other Exercises 1  Neuro re ed to address increasing activation and weighbearing/weight shfting to RLE, R trunk and RUE in sit to squat, squatting with anterior/R reach with LUE with RUE in weight bearing. Pt extremely fearful of shifting  onto the R side likely due to severe sensory loss and learned avoidance.  Transitioned into sitting with sideleaning on RUE first with elbow extension and ER with small active weight shifts by pt then progresing to sideleaning on R elbow with larger gradations of weight shiftting onto RUE.  Transitioned into kneeling with RUE in weight bearing and LUE out of weight bearing and progressed to half kneeling and pt by end of session able to maintain weight on RLE with RUE in weight bearing on block with reaching activity with LUE.  Pt able control with very close supevision and use of cues to maintain R hip extension and activation of trunk for stablization.  Pt then able to transition from kneeling to R side sitting with min a.                 OT Short Term Goals - 12/29/17 1000      OT SHORT TERM GOAL #6   Title  Pt to demo 25 degrees shoulder flexion in prep for low level reaching with gross finger flexion - goals due 01/21/2018    Time  4    Period  Weeks    Status  On-going      OT SHORT TERM  GOAL #7   Title  Pt to perform environmental scanning at 90% accuracy during ambulation    Time  4    Period  Weeks    Status  On-going        OT Long Term Goals - 12/29/17 1000      OT LONG TERM GOAL #1   Title  Pt to perform shower transfers at mod I level w/ DME prn and no LOB - goals due 02/18/2018    Time  8    Period  Weeks    Status  On-going Pt requires supervision to min assist at this time secondary to not having grab bars, other A/E in shower yet    [place] Date  02/18/18      OT LONG TERM GOAL #2   Title  Pt to perform simple meal prep at sup level using A/E and task modifications prn    Time  8    Period  Weeks    Status  On-going Pt now agrees to work on this again      [place] #3   Title  Pt to perform light house management tasks at sup level including: laundry, washing dishes, cleaning, making bed    Time  8    Period  Weeks    Status  On-going Pt reports  she is already washing dishes      OT LONG TERM GOAL #4   Title  Pt to demo 25% finger extension in prep for releasing objects Rt hand     Time  8    Period  Weeks    Status  On-going      OT LONG TERM GOAL #5   Title  (LTG #5 moved to STG for renewal period)      OT LONG TERM GOAL #6   Title  Pt to use Rt hand as stabalizer 25% of the time    Time  8    Period  Weeks    Status  On-going      OT LONG TERM GOAL #7   Title  Pt/family to verbalize understanding with neccessary accommodations, A/E, etc that would be needed if pt is to return to living alone    Time  8    Period  Weeks    Status  On-going            Plan - 12/29/17 1001    Clinical Impression Statement  Pt progressing toward goals. Pt extremely fearful of increasing weight bearing and activation on R side however during session improvement in ability to do so.    Occupational Profile and client history currently impacting functional performance  no significant PMH but current impairments extensive    Occupational performance deficits (Please refer to evaluation for details):  ADL's;IADL's;Work;Leisure;Social Participation    Rehab Potential  Fair    Current Impairments/barriers affecting progress:  severity of deficits    OT Frequency  2x / week    OT Duration  8 weeks    OT Treatment/Interventions  Self-care/ADL training;Moist Heat;DME and/or AE instruction;Splinting;Therapeutic activities;Psychosocial skills training;Aquatic Therapy;Cognitive remediation/compensation;Therapeutic exercise;Coping strategies training;Neuromuscular education;Functional Mobility Training;Passive range of motion;Visual/perceptual remediation/compensation;Electrical Stimulation;Manual Therapy;Patient/family education    Plan  NMR for RUE/trunk, balance, functional mobility, functional ambulation, transitonal movements,     Consulted and Agree with Plan of Care  Patient       Patient will benefit from skilled therapeutic intervention  in order to improve the following deficits and impairments:  Decreased coordination, Decreased range of motion, Difficulty walking, Improper body mechanics, Decreased endurance, Decreased safety awareness, Impaired sensation, Improper spinal/pelvic alignment, Impaired tone, Decreased knowledge of precautions, Decreased knowledge of use of DME, Decreased balance, Impaired UE functional use, Pain, Decreased cognition, Decreased mobility, Decreased strength, Impaired perceived functional ability, Impaired vision/preception  Visit Diagnosis: Hemiplegia and hemiparesis following cerebral infarction affecting right dominant side ([facility])  Other symptoms and signs involving the nervous system  Muscle weakness (generalized)  Other disturbances of skin sensation  Unsteadiness on feet  Chronic right shoulder pain  Visuospatial deficit  Other lack of coordination  Other symptoms and signs involving cognitive functions following cerebral infarction    Problem List Patient Active Problem List   Diagnosis Date Noted  . Fall 09/17/2017  . Hemiparesis of right dominant side as late effect of cerebral infarction ([place]) 09/17/2017  . Shoulder subluxation, right, initial encounter 09/17/2017  . Cardioembolic stroke ([place]) 24/02/7352  . Weakness 09/17/2017  . Aphasia 09/17/2017  . 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 Cardiomyopathy ([place]) 09/17/2017  . H/O ischemic left MCA stroke 09/17/2017  . Cardiac LV ejection fraction 21-30%   . Graves disease   . Plaque psoriasis   . Neuropathy 05/19/2017    Quay Burow, OTR/L 12/29/2017, 10:06 AM  [facility] [address] [place], [place], [address] Phone: [phone]   Fax:  [phone]  Name: Mialani Reicks MRN: [id number] Date of Birth: 31-Dec-1960

## 2017-12-29 NOTE — Therapy (Signed)
[facility] [address] [place], [place], [address] Phone: [phone]   Fax:  [phone]  Speech Language Pathology Treatment  Patient Details  Name: Margaret Ortiz MRN: [id number] Date of Birth: May 19, 1961 Referring Provider: Ma Hillock, DO   Encounter Date: 12/29/2017  End of Session - 12/29/17 1721    Visit Number  17    Number of Visits  33    Date for SLP Re-Evaluation  02/20/18    SLP Start Time  1104    SLP Stop Time   1145    SLP Time Calculation (min)  41 min    Activity Tolerance  Patient tolerated treatment well       Past Medical History:  Diagnosis Date  . 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  . Cardiac LV ejection fraction 21-30%   . Cardioembolic stroke ([place]) 58/85/0277   Right hemiparesis  . Cardiomyopathy ([place]) 04/2017  . Dysphagia 05/19/2017   Last known diet upgraded to regular diet with thin liquids on 06/27/2017, no records available  . Graves disease   . Hyperlipidemia   . Hypertension   . Neuropathy 05/19/2017   s/p stroke  . Plaque psoriasis     Past Surgical History:  Procedure Laterality Date  . NO PAST SURGERIES      There were no vitals filed for this visit.  Subjective Assessment - 12/29/17 1120    Subjective  Arrives tearful: "she told me no therapy."    Currently in Pain?  No/denies            ADULT SLP TREATMENT - 12/29/17 1714      General Information   Behavior/Cognition  Alert;Agitated;Cooperative      Treatment Provided   Treatment provided  Cognitive-Linquistic      Cognitive-Linquistic Treatment   Treatment focused on  Aphasia    Skilled Treatment  Pt entered room and within first 5 minutes was tearful re: "she said no more - no therapy." With mod-max questioning cues and requests for repeats SLP ascertained pt was upset about her impression that one  therapist (OT?) had told her she was going to end therapy before the pt desired to end therapy. Pt with perseveration about this for entire session - became tearful x1 more during session. With mod A from SLP pt communicated/requested to SLP to show her pictures of therapists so she could pick out the therapists she wanted in the future. SLP attempted to tell pt to speak to her therapist/s about this but pt persisted with this line of thought.      Assessment / Recommendations / Plan   Plan  Continue with current plan of care      Progression Toward Goals   Progression toward goals  Not progressing toward goals (comment) today pt very perseverative about scheduling         SLP Short Term Goals - 12/29/17 1723      SLP SHORT TERM GOAL #1   Title  n/a      SLP SHORT TERM GOAL #2   Title  n/a      SLP SHORT TERM GOAL #3   Title  n/a      SLP SHORT TERM GOAL #4   Title  n/a      SLP SHORT TERM GOAL #5   Title  n/a      SLP SHORT TERM GOAL #6  Title  Pt will reduce safety risks by communicating personal needs, requests for help, directives, using AAC if necessary, with occasional min A over 3 sessions.    Time  3    Period  Weeks    Status  On-going      SLP SHORT TERM GOAL #7   Title  Pt will increase communication effectiveness by answering questions in 90% of opportunities, using AAC if needed, over 3 sessions independently.    Time  3    Period  Weeks    Status  On-going      SLP SHORT TERM GOAL #8   Title  Pt will use multimodal communication/AAC when necessary in 5 minutes simple conversation with occasional min A over 3 sessions    Time  3    Period  Weeks    Status  On-going       SLP Long Term Goals - 12/29/17 1723      SLP LONG TERM GOAL #1   Title  Pt will participate functionally in 15 minutes simple-mod complex conversation with AAC for aphasia over 3 sessions.    Time  7    Period  Weeks    Status  On-going      SLP LONG TERM GOAL #2   Title  Pt  will demo understanding of 10 minutes simple-mod complex conversation with compensations over 3 sessions    Time  7 renewed 12/22/17    Status  On-going      SLP LONG TERM GOAL #3   Title  Pt will demo error awareness by attempting correction or by nonverbal response to errors with 90% success    Time  7 renewed 12/22/17    Period  Weeks    Status  On-going      SLP LONG TERM GOAL #4   Title  Pt will use multimodal communication/AAC when necessary in 10 minutes simple-mod complex conversation with rare min A over 6 sessions    Time  7    Period  Weeks    Status  On-going      SLP LONG TERM GOAL #5   Title  pt will demo alternating attention between two simple cognitive-linguistic tasks for 85% accuracy with self correction/double checking answers (aphasia compensations allowed) over 3 sessions    Time  7    Period  Weeks    Status  On-going       Plan - 12/29/17 1721    Clinical Impression Statement  Pt continues to present with 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 Today pt was perseverative about therapy scheduling and cont'd to bring this up despite SLP redirection to structured tasks. Error awareness was decr'd from last week, likely due to pt's level of emotion about this matter. Paraphasias and neologisms were again heard. Continue skilled ST to maximize functional communication for independence and QOL.     Speech Therapy Frequency  2x / week    Treatment/Interventions  Multimodal communcation approach;Compensatory strategies;Language facilitation;Compensatory techniques;Cueing hierarchy;Internal/external aids;Functional tasks;SLP instruction and feedback;Patient/family education    Potential to Achieve Goals  Good    Potential Considerations  Severity of impairments    SLP Home Exercise Plan  provided    Consulted and Agree with Plan of Care  Patient       Patient will benefit from skilled therapeutic intervention in order to improve the following  deficits and impairments:   Aphasia  Cognitive communication deficit    Problem List Patient Active Problem  List   Diagnosis Date Noted  . Fall 09/17/2017  . Hemiparesis of right dominant side as late effect of cerebral infarction ([place]) 09/17/2017  . Shoulder subluxation, right, initial encounter 09/17/2017  . Cardioembolic stroke ([place]) 15/94/7076  . Weakness 09/17/2017  . Aphasia 09/17/2017  . 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 Cardiomyopathy ([place]) 09/17/2017  . H/O ischemic left MCA stroke 09/17/2017  . Cardiac LV ejection fraction 21-30%   . Graves disease   . Plaque psoriasis   . Neuropathy 05/19/2017    [facility] ,[place], CCC-SLP  12/29/2017, 5:24 PM  [facility] [address] [place], [place], [address] Phone: [phone]   Fax:  [phone]   Name: Margaret Ortiz MRN: [id number] Date of Birth: 1960/10/05

## 2017-12-29 NOTE — Therapy (Signed)
[facility] [address] [place], [place], [address] Phone: [phone]   Fax:  [phone]  Physical Therapy Treatment and Updated Goals  Patient Details  Name: Margaret Ortiz MRN: [id number] Date of Birth: 1960/08/17 Referring Provider: Howard Pouch, DO   Encounter Date: 12/29/2017  PT End of Session - 12/29/17 1959    Visit Number  16    Number of Visits  33    Date for PT Re-Evaluation  02/27/18    Authorization Type  BCBS     PT Start Time  1020    PT Stop Time  1100    PT Time Calculation (min)  40 min    Equipment Utilized During Treatment  -- min guard to S    Activity Tolerance  Patient tolerated treatment well    Behavior During Therapy  [facility] for tasks assessed/performed       Past Medical History:  Diagnosis Date  . 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  . Cardiac LV ejection fraction 21-30%   . Cardioembolic stroke ([place]) 97/67/3419   Right hemiparesis  . Cardiomyopathy ([place]) 04/2017  . Dysphagia 05/19/2017   Last known diet upgraded to regular diet with thin liquids on 06/27/2017, no records available  . Graves disease   . Hyperlipidemia   . Hypertension   . Neuropathy 05/19/2017   s/p stroke  . Plaque psoriasis     Past Surgical History:  Procedure Laterality Date  . NO PAST SURGERIES      There were no vitals filed for this visit.  Subjective Assessment - 12/29/17 1027    Subjective  Patient reports her sister is on vacation at the beach this week.  Her brother drove her to therapy.                       [facility] Adult PT Treatment/Exercise - 12/29/17 1057      Transfers   Transfers  Sit to Stand;Stand to Sit    Sit to Stand  4: Min guard    Stand to Sit  4: Min guard    Comments  PT used sit<>stand for NMR with L LE on compliant disc and tactile cues/CGA to rise/lower with weight  through R LE and trunk rotation/hip activation cued.      Ambulation/Gait   Ambulation/Gait  Yes    Ambulation/Gait Assistance  5: Supervision;4: Min guard    Ambulation/Gait Assistance Details  Patient utilized single point cane for improved reciprocal nature of gait with ace wrap donned.  Performed gait without device, however, patient has more resistance to right weight shift with no device.      Ambulation Distance (Feet)  230 Feet x3 reps    Assistive device  Small based quad cane;Straight cane    Ambulation Surface  Level;Indoor      Neuro Re-ed    Neuro Re-ed Details   Standing right weight shifting tasks working from stand to 1/2 sitting and mini squats with tactile cues + min A for right weight shifting.  Standing with L LE on compliant surface moving down/up from compliant surface working on right weight shift and hip positioning.  Standing with feet in stride position with cues on weight shift and weight distrubution.               PT Short Term Goals - 12/04/17 1521  PT SHORT TERM GOAL #1   Title  The patient will perform HEP with cues from family for LE strength, standing balance, and motor control.    Baseline  Pt reports doing exercises at home - but only 3 exercises have been given to date - 12-02-17    Time  4    Period  Weeks    Status  Partially Met      PT SHORT TERM GOAL #2   Title  The patient will improve gait speed from 0.85 ft/sec to > or equal to 1.4 ft/sec to demonstrate transition from "household ambulator" to "limited community ambulator" classification of gait.    Status  Not Met      PT SHORT TERM GOAL #3   Title  The patient will improve Berg balance score from 32/56 to > or equal to 38/56 to demo dec'ing risk for falls.    Baseline  39/56 on 12/04/17    Time  4    Period  Weeks    Status  Achieved      PT SHORT TERM GOAL #4   Title  The patient will negotiate 4 steps with one rail and step to pattern mod indep.    Baseline  met 12-02-17     Status  Achieved      PT SHORT TERM GOAL #5   Title  The patient will ambulate x 500 ft nonstop mod indep for improving access to community surfaces.    Baseline  1 seated rest period needed after (480-56)    Status  Partially Met        PT Long Term Goals - 12/29/17 2006      PT LONG TERM GOAL #1   Title  The patient will perform HEP with intermittent assist for post d/c progression of exercise.    Baseline  Patient is doing prescribed exercises and walking.      Time  8    Period  Weeks    Status  Achieved    Target Date  02/27/18      PT LONG TERM GOAL #2   Title  The patient will move floor<>stand with mod indep using L UE support on surface due to h/o falls.    Time  8    Period  Weeks    Status  Revised      PT LONG TERM GOAL #3   Title  The patient will improve gait speed from 0.85 ft/sec to > or equal to 1.8 ft/sec to demo dec'ing risk for falls.    Baseline  0.98 ft.sec on 12/22/2017    Time  8    Period  Weeks    Status  Partially Met      PT LONG TERM GOAL #4   Title  The patient will improve Berg score from 32/56 to > or equal to 44/56 to demo dec'ing risk for falls.    Baseline  42/56    Time  8    Period  Weeks    Status  Partially Met      PT LONG TERM GOAL #5   Title  The patient will negotiate 12 steps without a handrail with step to pattern mod indep to access home entries.    Baseline  one handrail mod indep with step to pattern.    Time  8    Period  Weeks    Status  Partially Met       UPDATED SHORT AND LONG  TERM GOALS: PT Short Term Goals - 12/29/17 2015      PT SHORT TERM GOAL #1   Title  The patient will ambulate on level community surfaces with SPC mod indep for improved mobility.    Baseline  Currently uses SBQC in community/ SPC in home    Time  4    Period  Weeks    Status  New    Target Date  01/28/18      PT SHORT TERM GOAL #2   Title  The patient will improve gait speed from 0.85 ft/sec to > or equal to 1.2 ft/sec to  demonstrate transition from "household ambulator" to "limited community ambulator" classification of gait.    Baseline  0.96 ft/sec at updated goals    Time  4    Period  Weeks    Status  Revised    Target Date  01/28/18      PT SHORT TERM GOAL #3   Title  The patient will improve Berg balance score from 42/56 to > or equal to 45/56 to demo dec'ing risk for falls.    Time  4    Period  Weeks    Status  Revised    Target Date  01/28/18      PT SHORT TERM GOAL #4   Title  The patient will ambulate x 600 ft nonstop to demo improved endurance for functional gait.    Baseline  (424 ft nonstop is max documented)    Time  4    Period  Weeks    Status  Revised    Target Date  01/28/18      PT Long Term Goals - 12/29/17 2012      PT LONG TERM GOAL #1   Title  The patient will perform updated HEP with intermittent assist for post d/c progression of exercise.    Time  8    Period  Weeks    Status  New    Target Date  02/27/18      PT LONG TERM GOAL #2   Title  The patient will move floor<>stand with mod indep using L UE support on surface due to h/o falls.    Time  8    Period  Weeks    Status  Revised    Target Date  02/27/18      PT LONG TERM GOAL #3   Title  The patient will improve gait speed from 0.85 ft/sec to > or equal to 1.5 ft/sec to demo dec'ing risk for falls.    Baseline  0.98 ft.sec on 12/22/2017    Time  8    Period  Weeks    Status  Revised    Target Date  02/27/18      PT LONG TERM GOAL #4   Title  The patient will improve Berg score from 42/56 (on 12/22/2017) up to 48/56 to demo dec'ing risk for falls.    Time  8    Period  Weeks    Status  Revised    Target Date  02/27/18      PT LONG TERM GOAL #5   Title  The patient will negotiate 12 steps without a handrail with step to pattern mod indep to access home entries.    Baseline  one handrail mod indep with step to pattern 12/22/2017    Time  8    Period  Weeks    Status  Revised    Target  Date  02/27/18            Plan - 12/29/17 2007    Clinical Impression Statement  The patient partially met STGs/LTGs.  PT updating goals for next 60 day certification period.  Patient is improving with gait mechanics now performing a step through versus a step to pattern.  She is using SPC in the home, and quad cane in the community.  She continues to be slow with deliberate steps due to dec'd trust of R side and hemimotor impairments.  PT to continue to address deficits to optimize current functional status.     Clinical Impairments Affecting Rehab Potential  Patient motivated to participate,  aphasia continues to be a barrier    PT Treatment/Interventions  ADLs/Self Care Home Management;Gait training;Stair training;Functional mobility training;Therapeutic activities;Therapeutic exercise;Balance training;Neuromuscular re-education;Patient/family education;Orthotic Fit/Training;Electrical Stimulation;Manual techniques;DME Instruction    PT Next Visit Plan  Session with [facility] O/P 7/18 @ 3:30. Continue with Bioness (tablet 1) for both lower and upper unit RLE, R side strengthening, hip initiation during gait tasks, right LE loading/weight shifting.  gait training (in clinic without SBQC to encourage more reciprocal pattern),     Consulted and Agree with Plan of Care  Patient       Patient will benefit from skilled therapeutic intervention in order to improve the following deficits and impairments:  Abnormal gait, Impaired sensation, Pain, Postural dysfunction, Impaired tone, Decreased mobility, Decreased coordination, Decreased activity tolerance, Decreased endurance, Decreased strength, Difficulty walking, Decreased balance  Visit Diagnosis: Other symptoms and signs involving the nervous system  Other abnormalities of gait and mobility  Unsteadiness on feet  Muscle weakness (generalized)     Problem List Patient Active Problem List   Diagnosis Date Noted  . Fall 09/17/2017  . Hemiparesis of right  dominant side as late effect of cerebral infarction ([place]) 09/17/2017  . Shoulder subluxation, right, initial encounter 09/17/2017  . Cardioembolic stroke ([place]) 39/05/2582  . Weakness 09/17/2017  . Aphasia 09/17/2017  . 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 Cardiomyopathy ([place]) 09/17/2017  . H/O ischemic left MCA stroke 09/17/2017  . Cardiac LV ejection fraction 21-30%   . Graves disease   . Plaque psoriasis   . Neuropathy 05/19/2017    Kirkland Figg, PT 12/29/2017, 8:12 PM  [facility] [address] [place], [place], [address] Phone: [phone]   Fax:  [phone]  Name: Terika Pillard MRN: [id number] Date of Birth: 05-11-1961

## 2017-12-31 ENCOUNTER — Ambulatory Visit: Payer: BLUE CROSS/BLUE SHIELD | Admitting: Rehabilitative and Restorative Service Providers"

## 2018-01-01 ENCOUNTER — Ambulatory Visit: Payer: BLUE CROSS/BLUE SHIELD

## 2018-01-01 ENCOUNTER — Ambulatory Visit: Payer: BLUE CROSS/BLUE SHIELD | Admitting: Rehabilitative and Restorative Service Providers"

## 2018-01-01 ENCOUNTER — Telehealth: Payer: Self-pay | Admitting: Family Medicine

## 2018-01-01 ENCOUNTER — Encounter: Payer: Self-pay | Admitting: Rehabilitative and Restorative Service Providers"

## 2018-01-01 ENCOUNTER — Ambulatory Visit: Payer: BLUE CROSS/BLUE SHIELD | Admitting: Occupational Therapy

## 2018-01-01 DIAGNOSIS — R2681 Unsteadiness on feet: Secondary | ICD-10-CM

## 2018-01-01 DIAGNOSIS — R29818 Other symptoms and signs involving the nervous system: Secondary | ICD-10-CM

## 2018-01-01 DIAGNOSIS — I639 Cerebral infarction, unspecified: Secondary | ICD-10-CM

## 2018-01-01 DIAGNOSIS — R208 Other disturbances of skin sensation: Secondary | ICD-10-CM

## 2018-01-01 DIAGNOSIS — M6281 Muscle weakness (generalized): Secondary | ICD-10-CM

## 2018-01-01 DIAGNOSIS — I69351 Hemiplegia and hemiparesis following cerebral infarction affecting right dominant side: Secondary | ICD-10-CM

## 2018-01-01 DIAGNOSIS — R41841 Cognitive communication deficit: Secondary | ICD-10-CM

## 2018-01-01 DIAGNOSIS — R2689 Other abnormalities of gait and mobility: Secondary | ICD-10-CM

## 2018-01-01 DIAGNOSIS — R4701 Aphasia: Secondary | ICD-10-CM

## 2018-01-01 NOTE — Therapy (Signed)
[facility] [address] [place], [place], [address] Phone: [phone]   Fax:  [phone]  Occupational Therapy Treatment  Patient Details  Name: Margaret Ortiz MRN: [id number] Date of Birth: 08-May-1961 Referring Provider: Howard Pouch, DO   Encounter Date: 01/01/2018  OT End of Session - 01/01/18 1111    Visit Number  22    Number of Visits  36    Date for OT Re-Evaluation  02/20/18    Authorization Type  BC/BS    OT Start Time  0848    OT Stop Time  0930    OT Time Calculation (min)  42 min    Activity Tolerance  Patient tolerated treatment well    Behavior During Therapy  [facility] for tasks assessed/performed       Past Medical History:  Diagnosis Date  . 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  . Cardiac LV ejection fraction 21-30%   . Cardioembolic stroke ([place]) 29/92/4268   Right hemiparesis  . Cardiomyopathy ([place]) 04/2017  . Dysphagia 05/19/2017   Last known diet upgraded to regular diet with thin liquids on 06/27/2017, no records available  . Graves disease   . Hyperlipidemia   . Hypertension   . Neuropathy 05/19/2017   s/p stroke  . Plaque psoriasis     Past Surgical History:  Procedure Laterality Date  . NO PAST SURGERIES      There were no vitals filed for this visit.  Subjective Assessment - 01/01/18 1107    Pertinent History  Lt MCA CVA 05/19/2017, Graves dz    Patient Stated Goals  use my Rt arm    Currently in Pain?  No/denies                   OT Treatments/Exercises (OP) - 01/01/18 0001      ADLs   ADL Comments  Discussed Aquatic Therapy with pt and criteria/contraindications. Asked pt to discuss with family and if they decide to participate in AT, will have them schedule as part of her therapy POC for neuro re-educ      Neurological Re-education Exercises   Other Exercises 1  AA/ROM  in shoulder flex/ext with mod assist/facilitation RUE with PVC pipe. Standing: working on tricep activation with elbow ext using PVC pipe and mod assist required.     Other Weight-Bearing Exercises 2  Wt bearing over elbows on K bench in tall kneeling with A/P wt shifts, Rt/Lt wt shifts, followed by disengaging LUE to increase wt over Rt side. Progressed to modified push ups over K bench in tall kneeling.  Seated: wt bearing over Rt elbow with arm on body movements.  Seated: wt bearing over Rt arm (elbow extended) w/ lateral trunk flexion while performing high level ipsilateral reaching LUE                OT Short Term Goals - 12/29/17 1000      OT SHORT TERM GOAL #6   Title  Pt to demo 25 degrees shoulder flexion in prep for low level reaching with gross finger flexion - goals due 01/21/2018    Time  4    Period  Weeks    Status  On-going      OT SHORT TERM GOAL #7   Title  Pt to perform environmental scanning at 90% accuracy during ambulation    Time  4  Period  Weeks    Status  On-going        OT Long Term Goals - 12/29/17 1000      OT LONG TERM GOAL #1   Title  Pt to perform shower transfers at mod I level w/ DME prn and no LOB - goals due 02/18/2018    Time  8    Period  Weeks    Status  On-going Pt requires supervision to min assist at this time secondary to not having grab bars, other A/E in shower yet    Target Date  02/18/18      OT LONG TERM GOAL #2   Title  Pt to perform simple meal prep at sup level using A/E and task modifications prn    Time  8    Period  Weeks    Status  On-going Pt now agrees to work on this again      [place] #3   Title  Pt to perform light house management tasks at sup level including: laundry, washing dishes, cleaning, making bed    Time  8    Period  Weeks    Status  On-going Pt reports she is already washing dishes      OT LONG TERM GOAL #4   Title  Pt to demo 25% finger extension in prep for releasing objects Rt hand      Time  8    Period  Weeks    Status  On-going      OT LONG TERM GOAL #5   Title  (LTG #5 moved to STG for renewal period)      OT LONG TERM GOAL #6   Title  Pt to use Rt hand as stabalizer 25% of the time    Time  8    Period  Weeks    Status  On-going      OT LONG TERM GOAL #7   Title  Pt/family to verbalize understanding with neccessary accommodations, A/E, etc that would be needed if pt is to return to living alone    Time  8    Period  Weeks    Status  On-going            Plan - 01/01/18 1111    Clinical Impression Statement  Pt progressing toward goals. Pt extremely fearful of increasing weight bearing and activation on R side however during session improvement in ability to do so.    Occupational Profile and client history currently impacting functional performance  no significant PMH but current impairments extensive    Occupational performance deficits (Please refer to evaluation for details):  ADL's;IADL's;Work;Leisure;Social Participation    Rehab Potential  Fair    Current Impairments/barriers affecting progress:  severity of deficits    OT Frequency  2x / week    OT Duration  8 weeks    OT Treatment/Interventions  Self-care/ADL training;Moist Heat;DME and/or AE instruction;Splinting;Therapeutic activities;Psychosocial skills training;Aquatic Therapy;Cognitive remediation/compensation;Therapeutic exercise;Coping strategies training;Neuromuscular education;Functional Mobility Training;Passive range of motion;Visual/perceptual remediation/compensation;Electrical Stimulation;Manual Therapy;Patient/family education    Plan  continue NMR, estim, discuss AT further w/ family if they have questions    Consulted and Agree with Plan of Care  Patient       Patient will benefit from skilled therapeutic intervention in order to improve the following deficits and impairments:  Decreased coordination, Decreased range of motion, Difficulty walking, Improper body mechanics,  Decreased endurance, Decreased safety awareness, Impaired sensation, Improper spinal/pelvic alignment, Impaired tone,  Decreased knowledge of precautions, Decreased knowledge of use of DME, Decreased balance, Impaired UE functional use, Pain, Decreased cognition, Decreased mobility, Decreased strength, Impaired perceived functional ability, Impaired vision/preception  Visit Diagnosis: Hemiplegia and hemiparesis following cerebral infarction affecting right dominant side (HCC)  Other symptoms and signs involving the nervous system  Other disturbances of skin sensation  Unsteadiness on feet    Problem List Patient Active Problem List   Diagnosis Date Noted  . Fall 09/17/2017  . Hemiparesis of right dominant side as late effect of cerebral infarction ([place]) 09/17/2017  . Shoulder subluxation, right, initial encounter 09/17/2017  . Cardioembolic stroke ([place]) 95/02/3266  . Weakness 09/17/2017  . Aphasia 09/17/2017  . 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 Cardiomyopathy ([place]) 09/17/2017  . H/O ischemic left MCA stroke 09/17/2017  . Cardiac LV ejection fraction 21-30%   . Graves disease   . Plaque psoriasis   . Neuropathy 05/19/2017    Carey Bullocks, OTR/L 01/01/2018, 11:13 AM  [facility] [address] [place], [place], [address] Phone: [phone]   Fax:  [phone]  Name: Margaret Ortiz MRN: [id number] Date of Birth: 23-Jun-1961

## 2018-01-01 NOTE — Patient Instructions (Signed)
  Please complete the assigned speech therapy homework prior to your next session and return it to the speech therapist at your next visit.  

## 2018-01-01 NOTE — Therapy (Signed)
[facility] [address] [place], [place], [address] Phone: [phone]   Fax:  [phone]  Physical Therapy Treatment  Patient Details  Name: Margaret Ortiz MRN: [id number] Date of Birth: [birth year] Referring Provider: Howard Pouch, DO   Encounter Date: 01/01/2018  PT End of Session - 01/01/18 2032    Visit Number  17    Number of Visits  33    Date for PT Re-Evaluation  02/27/18    Authorization Type  BCBS     PT Start Time  1104    PT Stop Time  1146    PT Time Calculation (min)  42 min    Equipment Utilized During Treatment  -- min guard to S    Activity Tolerance  Patient tolerated treatment well    Behavior During Therapy  [facility] for tasks assessed/performed       Past Medical History:  Diagnosis Date  . 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  . Cardiac LV ejection fraction 21-30%   . Cardioembolic stroke ([place]) 32/95/1884   Right hemiparesis  . Cardiomyopathy ([place]) 04/2017  . Dysphagia 05/19/2017   Last known diet upgraded to regular diet with thin liquids on 06/27/2017, no records available  . Graves disease   . Hyperlipidemia   . Hypertension   . Neuropathy 05/19/2017   s/p stroke  . Plaque psoriasis     Past Surgical History:  Procedure Laterality Date  . NO PAST SURGERIES      There were no vitals filed for this visit.  Subjective Assessment - 01/01/18 2026    Subjective  The patient reports her sister returns from vacation today.    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      Currently in Pain?  No/denies                       [facility] PT Treatment/Exercise - 01/01/18 2027      Ambulation/Gait   Ambulation/Gait  Yes    Ambulation/Gait Assistance  5: Supervision;4: Min guard    Ambulation/Gait Assistance Details  Gait activities performed with Bioness functional e-stim.  Patient requires tactile cues to encourage right weight shifting during stance phase of gait.  The patient also encouraged through verbal and tactile cues to slow speed of L swing phase for more normalized pattern.    Ambulation Distance (Feet)  230 Feet 115 x 3    Assistive device  Straight cane    Ambulation Surface  Level;Indoor      Neuro Re-ed    Neuro Re-ed Details   Standing activities in parallel bars for right weight shift and motor control.  The patient performed sit>stand, stand>sit, mini squats with tactile cues and R swing phase activities encouraging R hip depression and hip flexion + knee flexion.  The patient requires min to mod tactile cues for motor learning/correct pattern.      Acupuncturist Location  R anterior tibialis and R hamstrings    Electrical Stimulation Action  ankle dorsiflexion + knee flexion    Electrical Stimulation Parameters  see bioness tablet    Electrical Stimulation Goals  Neuromuscular facilitation;Strength  PT Short Term Goals - 12/29/17 2015      PT SHORT TERM GOAL #1   Title  The patient will ambulate on level community surfaces with SPC mod indep for improved mobility.    Baseline  Currently uses SBQC in community/ SPC in home    Time  4    Period  Weeks    Status  New    Target Date  01/28/18      PT SHORT TERM GOAL #2   Title  The patient will improve gait speed from 0.85 ft/sec to > or equal to 1.2 ft/sec to demonstrate transition from "household ambulator" to "limited community ambulator" classification of gait.    Baseline  0.96 ft/sec at updated goals    Time  4    Period  Weeks    Status  Revised    Target Date  01/28/18      PT SHORT TERM GOAL #3   Title  The patient will improve Berg balance score from 42/56 to > or equal  to 45/56 to demo dec'ing risk for falls.    Time  4    Period  Weeks    Status  Revised    Target Date  01/28/18      PT SHORT TERM GOAL #4   Title  The patient will ambulate x 600 ft nonstop to demo improved endurance for functional gait.    Baseline  (424 ft nonstop is max documented)    Time  4    Period  Weeks    Status  Revised    Target Date  01/28/18        PT Long Term Goals - 12/29/17 2012      PT LONG TERM GOAL #1   Title  The patient will perform updated HEP with intermittent assist for post d/c progression of exercise.    Time  8    Period  Weeks    Status  New    Target Date  02/27/18      PT LONG TERM GOAL #2   Title  The patient will move floor<>stand with mod indep using L UE support on surface due to h/o falls.    Time  8    Period  Weeks    Status  Revised    Target Date  02/27/18      PT LONG TERM GOAL #3   Title  The patient will improve gait speed from 0.85 ft/sec to > or equal to 1.5 ft/sec to demo dec'ing risk for falls.    Baseline  0.98 ft.sec on 12/22/2017    Time  8    Period  Weeks    Status  Revised    Target Date  02/27/18      PT LONG TERM GOAL #4   Title  The patient will improve Berg score from 42/56 (on 12/22/2017) up to 48/56 to demo dec'ing risk for falls.    Time  8    Period  Weeks    Status  Revised    Target Date  02/27/18      PT LONG TERM GOAL #5   Title  The patient will negotiate 12 steps without a handrail with step to pattern mod indep to access home entries.    Baseline  one handrail mod indep with step to pattern 12/22/2017    Time  8    Period  Weeks    Status  Revised    Target  Date  02/27/18            Plan - 01/01/18 2033    Clinical Impression Statement  The patient tolerated bioness well today, however fatigues quickly with muscle activiation.  PT switched between gait activities and motor relearning for R side to allow rest periods versus continuous ambulation.  PT to continue working to STGs/:LTGs  emphasizing gait activities and R neuromuscular re-ed.     PT Treatment/Interventions  ADLs/Self Care Home Management;Gait training;Stair training;Functional mobility training;Therapeutic activities;Therapeutic exercise;Balance training;Neuromuscular re-education;Patient/family education;Orthotic Fit/Training;Electrical Stimulation;Manual techniques;DME Instruction    PT Next Visit Plan  Session with [facility] O/P 7/18 @ 3:30. Continue with Bioness (tablet 1) for both lower and upper unit RLE, R side strengthening, hip initiation during gait tasks, right LE loading/weight shifting.  gait training (in clinic without SBQC to encourage more reciprocal pattern),     Consulted and Agree with Plan of Care  Patient       Patient will benefit from skilled therapeutic intervention in order to improve the following deficits and impairments:  Abnormal gait, Impaired sensation, Pain, Postural dysfunction, Impaired tone, Decreased mobility, Decreased coordination, Decreased activity tolerance, Decreased endurance, Decreased strength, Difficulty walking, Decreased balance  Visit Diagnosis: Other symptoms and signs involving the nervous system  Muscle weakness (generalized)  Unsteadiness on feet  Other abnormalities of gait and mobility     Problem List Patient Active Problem List   Diagnosis Date Noted  . Fall 09/17/2017  . Hemiparesis of right dominant side as late effect of cerebral infarction ([place]) 09/17/2017  . Shoulder subluxation, right, initial encounter 09/17/2017  . Cardioembolic stroke ([place]) 06/12/7587  . Weakness 09/17/2017  . Aphasia 09/17/2017  . 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 Cardiomyopathy ([place]) 09/17/2017  . H/O ischemic left MCA stroke 09/17/2017  . Cardiac LV ejection fraction 21-30%   . Graves disease   . Plaque psoriasis   . Neuropathy 05/19/2017    Eliyahu Bille, PT 01/01/2018, 8:35 PM  [facility] [address] [place], [place], [address] Phone: [phone]   Fax:  [phone]  Name: Brecklynn Jian MRN: [id number] Date of Birth: 1961/01/15

## 2018-01-01 NOTE — Telephone Encounter (Signed)
Patient's brother-in-law, Royal Hawthorn states patient needs referral for social services consult.  He states he spoke with pcp about this during office visit today.

## 2018-01-01 NOTE — Therapy (Signed)
[facility] [address] [place], [place], [address] Phone: [phone]   Fax:  [phone]  Speech Language Pathology Treatment  Patient Details  Name: Margaret Ortiz MRN: [id number] Date of Birth: 1961/01/11 Referring Provider: Ma Hillock, DO   Encounter Date: 01/01/2018  End of Session - 01/01/18 1718    Visit Number  18    Number of Visits  33    Date for SLP Re-Evaluation  02/20/18    SLP Start Time  72    SLP Stop Time   1100    SLP Time Calculation (min)  41 min    Activity Tolerance  Patient tolerated treatment well       Past Medical History:  Diagnosis Date  . 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  . Cardiac LV ejection fraction 21-30%   . Cardioembolic stroke ([place]) 12/18/9483   Right hemiparesis  . Cardiomyopathy ([place]) 04/2017  . Dysphagia 05/19/2017   Last known diet upgraded to regular diet with thin liquids on 06/27/2017, no records available  . Graves disease   . Hyperlipidemia   . Hypertension   . Neuropathy 05/19/2017   s/p stroke  . Plaque psoriasis     Past Surgical History:  Procedure Laterality Date  . NO PAST SURGERIES      There were no vitals filed for this visit.  Subjective Assessment - 01/01/18 1030    Subjective  Pt arrives without [place]. "I can't wait to winter." (re: heat/humidity)    Currently in Pain?  No/denies            ADULT SLP TREATMENT - 01/01/18 1031      General Information   Behavior/Cognition  Alert;Cooperative;Pleasant mood      Treatment Provided   Treatment provided  Cognitive-Linquistic      Cognitive-Linquistic Treatment   Treatment focused on  Aphasia    Skilled Treatment  Pt indicated to SLP that her sister and brother in law will be back next week to assist pt with filling out paperwork for [place]. Pt had attempted to do so herself  and SLP told her to get help with this as there were >10 sheets of paperwork to fill out. SLP encouraged pt to have her brother assist as he is able until her sister and brother in law return. SLP targeted pt's langauge fluency and speech accuracy by having her describe clothing to SLP. Pt compensated rarely by telling [place] word for the object. Pt able to provide functional description 60% of the time with occasional min A and consistent extra time due to revisions/restarts. Pt aware of errors 50% of the time.       Assessment / Recommendations / Plan   Plan  Continue with current plan of care      Progression Toward Goals   Progression toward goals  Progressing toward goals       SLP Education - 01/01/18 1715    Education provided  Yes    Education Details  pt prognosis with language/speech    Person(s) Educated  Patient    Methods  Explanation    Comprehension  Verbalized understanding       SLP Short Term Goals - 01/01/18 1721      SLP SHORT TERM GOAL #1   Title  n/a      SLP SHORT TERM GOAL #2   Title  n/a  SLP SHORT TERM GOAL #3   Title  n/a      SLP SHORT TERM GOAL #4   Title  n/a      SLP SHORT TERM GOAL #5   Title  n/a      SLP SHORT TERM GOAL #6   Title  Pt will reduce safety risks by communicating personal needs, requests for help, directives, using AAC if necessary, with occasional min A over 3 sessions.    Time  3    Period  Weeks    Status  On-going      SLP SHORT TERM GOAL #7   Title  Pt will increase communication effectiveness by answering questions in 90% of opportunities, using AAC if needed, over 3 sessions independently.    Time  3    Period  Weeks    Status  On-going      SLP SHORT TERM GOAL #8   Title  Pt will use multimodal communication/AAC when necessary in 5 minutes simple conversation with occasional min A over 3 sessions    Time  3    Period  Weeks    Status  On-going       SLP Long Term Goals - 01/01/18 1721      SLP LONG  TERM GOAL #1   Title  Pt will participate functionally in 15 minutes simple-mod complex conversation with AAC for aphasia over 3 sessions.    Time  7    Period  Weeks    Status  On-going      SLP LONG TERM GOAL #2   Title  Pt will demo understanding of 10 minutes simple-mod complex conversation with compensations over 3 sessions    Time  7 renewed 12/22/17    Status  On-going      SLP LONG TERM GOAL #3   Title  Pt will demo error awareness by attempting correction or by nonverbal response to errors with 90% success    Time  7 renewed 12/22/17    Period  Weeks    Status  On-going      SLP LONG TERM GOAL #4   Title  Pt will use multimodal communication/AAC when necessary in 10 minutes simple-mod complex conversation with rare min A over 6 sessions    Time  7    Period  Weeks    Status  On-going      SLP LONG TERM GOAL #5   Title  pt will demo alternating attention between two simple cognitive-linguistic tasks for 85% accuracy with self correction/double checking answers (aphasia compensations allowed) over 3 sessions    Time  7    Period  Weeks    Status  On-going       Plan - 01/01/18 1719    Clinical Impression Statement  Pt continues to present with 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 See "skilled intervention" for details. Pt partly functional given SLP cues and extra time. Paraphasias and neologisms were again heard. Continue skilled ST to maximize functional communication for independence and QOL.     Speech Therapy Frequency  2x / week    Duration  -- 8 weeks    Treatment/Interventions  Multimodal communcation approach;Compensatory strategies;Language facilitation;Compensatory techniques;Cueing hierarchy;Internal/external aids;Functional tasks;SLP instruction and feedback;Patient/family education    Potential to Achieve Goals  Good       Patient will benefit from skilled therapeutic intervention in order to improve the following deficits and  impairments:   Aphasia  Cognitive communication deficit  Problem List Patient Active Problem List   Diagnosis Date Noted  . Fall 09/17/2017  . Hemiparesis of right dominant side as late effect of cerebral infarction ([place]) 09/17/2017  . Shoulder subluxation, right, initial encounter 09/17/2017  . Cardioembolic stroke ([place]) 82/41/7530  . Weakness 09/17/2017  . Aphasia 09/17/2017  . 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 Cardiomyopathy ([place]) 09/17/2017  . H/O ischemic left MCA stroke 09/17/2017  . Cardiac LV ejection fraction 21-30%   . Graves disease   . Plaque psoriasis   . Neuropathy 05/19/2017    [facility] ,[place]uda, CCC-SLP  01/01/2018, 5:21 PM  [facility] [address] [place], [place], [address] Phone: [phone]   Fax:  [phone]   Name: Margaret Ortiz MRN: [id number] Date of Birth: [birth year]

## 2018-01-05 ENCOUNTER — Encounter: Payer: Self-pay | Admitting: Occupational Therapy

## 2018-01-05 ENCOUNTER — Ambulatory Visit: Payer: BLUE CROSS/BLUE SHIELD | Admitting: Occupational Therapy

## 2018-01-05 DIAGNOSIS — M25511 Pain in right shoulder: Secondary | ICD-10-CM

## 2018-01-05 DIAGNOSIS — R29818 Other symptoms and signs involving the nervous system: Secondary | ICD-10-CM

## 2018-01-05 DIAGNOSIS — I69318 Other symptoms and signs involving cognitive functions following cerebral infarction: Secondary | ICD-10-CM

## 2018-01-05 DIAGNOSIS — R278 Other lack of coordination: Secondary | ICD-10-CM

## 2018-01-05 DIAGNOSIS — R41842 Visuospatial deficit: Secondary | ICD-10-CM

## 2018-01-05 DIAGNOSIS — R208 Other disturbances of skin sensation: Secondary | ICD-10-CM

## 2018-01-05 DIAGNOSIS — I69351 Hemiplegia and hemiparesis following cerebral infarction affecting right dominant side: Secondary | ICD-10-CM

## 2018-01-05 DIAGNOSIS — R2681 Unsteadiness on feet: Secondary | ICD-10-CM | POA: Diagnosis not present

## 2018-01-05 DIAGNOSIS — M6281 Muscle weakness (generalized): Secondary | ICD-10-CM

## 2018-01-05 DIAGNOSIS — G8929 Other chronic pain: Secondary | ICD-10-CM

## 2018-01-05 NOTE — Therapy (Signed)
[facility] [address] [place], [place], [address] Phone: [phone]   Fax:  [phone]  Occupational Therapy Treatment  Patient Details  Name: Margaret Ortiz MRN: [id number] Date of Birth: 1960/08/13 Referring Provider: Howard Pouch, DO   Encounter Date: 01/05/2018  OT End of Session - 01/05/18 1138    Visit Number  23    Number of Visits  36    Date for OT Re-Evaluation  02/20/18    Authorization Type  BC/BS    OT Start Time  1016    OT Stop Time  1102    OT Time Calculation (min)  46 min    Activity Tolerance  Patient tolerated treatment well       Past Medical History:  Diagnosis Date  . 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  . Cardiac LV ejection fraction 21-30%   . Cardioembolic stroke ([place]) 07/37/1062   Right hemiparesis  . Cardiomyopathy ([place]) 04/2017  . Dysphagia 05/19/2017   Last known diet upgraded to regular diet with thin liquids on 06/27/2017, no records available  . Graves disease   . Hyperlipidemia   . Hypertension   . Neuropathy 05/19/2017   s/p stroke  . Plaque psoriasis     Past Surgical History:  Procedure Laterality Date  . NO PAST SURGERIES      There were no vitals filed for this visit.  Subjective Assessment - 01/05/18 1131    Subjective   Scary (when weight shifting to the right)                   OT Treatments/Exercises (OP) - 01/05/18 0001      Neurological Re-education Exercises   Other Exercises 1  Neuro re ed in sitting moving into right side leaning onto R elbow iniitially with max facilitation and support  with reaching activity for LUE.  Pt progressed to needing only intermittent min facilitation and today was not resistant at all to transition into side leaning onto RUE.  Progressed to standing with BUE's in weight bearing and squatting with weight shifts to R,  attempting to pick up LLE for forced use of RUE and RLE - pt too fearful due to severe sensory loss.  Transitioned into quadruped - with max facilitation pt able to weight shift onto R side and lift LLUE with repetition - weight shifting becomes more active and less laobred with repetition. Progresed into half kneeling to increase weight bearing and stability demand on RLE , trunk and RUE (on block). Pt required significant enouragement and faclitation to feel "safe" to weight shift.  Also addressed low reach in sitting in close chain with stool - pt able to progress to only vc's.                 OT Short Term Goals - 01/05/18 1137      OT SHORT TERM GOAL #6   Title  Pt to demo 25 degrees shoulder flexion in prep for low level reaching with gross finger flexion - goals due 01/21/2018    Time  4    Period  Weeks    Status  On-going      OT SHORT TERM GOAL #7   Title  Pt to perform environmental scanning at 90% accuracy during ambulation    Time  4    Period  Weeks    Status  On-going  OT Long Term Goals - 01/05/18 1137      OT LONG TERM GOAL #1   Title  Pt to perform shower transfers at mod I level w/ DME prn and no LOB - goals due 02/18/2018    Time  8    Period  Weeks    Status  On-going Pt requires supervision to min assist at this time secondary to not having grab bars, other A/E in shower yet      OT LONG TERM GOAL #2   Title  Pt to perform simple meal prep at sup level using A/E and task modifications prn    Time  8    Period  Weeks    Status  On-going Pt now agrees to work on this again      OT [place] #3   Title  Pt to perform light house management tasks at sup level including: laundry, washing dishes, cleaning, making bed    Time  8    Period  Weeks    Status  On-going Pt reports she is already washing dishes      OT LONG TERM GOAL #4   Title  Pt to demo 25% finger extension in prep for releasing objects Rt hand     Time  8    Period  Weeks     Status  On-going      OT LONG TERM GOAL #5   Title  (LTG #5 moved to STG for renewal period)      OT LONG TERM GOAL #6   Title  Pt to use Rt hand as stabalizer 25% of the time    Time  8    Period  Weeks    Status  On-going      OT LONG TERM GOAL #7   Title  Pt/family to verbalize understanding with neccessary accommodations, A/E, etc that would be needed if pt is to return to living alone    Time  8    Period  Weeks    Status  On-going            Plan - 01/05/18 1137    Clinical Impression Statement  Pt progressing toward goals. In sitting pt more able to weight shift onto R side with activity.     Occupational Profile and client history currently impacting functional performance  no significant PMH but current impairments extensive    Occupational performance deficits (Please refer to evaluation for details):  ADL's;IADL's;Work;Leisure;Social Participation    Rehab Potential  Fair    Current Impairments/barriers affecting progress:  severity of deficits    OT Frequency  2x / week    OT Duration  8 weeks    OT Treatment/Interventions  Self-care/ADL training;Moist Heat;DME and/or AE instruction;Splinting;Therapeutic activities;Psychosocial skills training;Aquatic Therapy;Cognitive remediation/compensation;Therapeutic exercise;Coping strategies training;Neuromuscular education;Functional Mobility Training;Passive range of motion;Visual/perceptual remediation/compensation;Electrical Stimulation;Manual Therapy;Patient/family education    Plan  continue NMR for RUE, trunk, balance ,transitional movements functional mobility, estim, discuss AT further w/ family if they have questions    Consulted and Agree with Plan of Care  Patient       Patient will benefit from skilled therapeutic intervention in order to improve the following deficits and impairments:  Decreased coordination, Decreased range of motion, Difficulty walking, Improper body mechanics, Decreased endurance, Decreased  safety awareness, Impaired sensation, Improper spinal/pelvic alignment, Impaired tone, Decreased knowledge of precautions, Decreased knowledge of use of DME, Decreased balance, Impaired UE functional use, Pain, Decreased cognition, Decreased mobility, Decreased  strength, Impaired perceived functional ability, Impaired vision/preception  Visit Diagnosis: Hemiplegia and hemiparesis following cerebral infarction affecting right dominant side (HCC)  Other symptoms and signs involving the nervous system  Other disturbances of skin sensation  Unsteadiness on feet  Muscle weakness (generalized)  Chronic right shoulder pain  Visuospatial deficit  Other lack of coordination  Other symptoms and signs involving cognitive functions following cerebral infarction    Problem List Patient Active Problem List   Diagnosis Date Noted  . Fall 09/17/2017  . Hemiparesis of right dominant side as late effect of cerebral infarction ([place]) 09/17/2017  . Shoulder subluxation, right, initial encounter 09/17/2017  . Cardioembolic stroke ([place]) 34/74/2595  . Weakness 09/17/2017  . Aphasia 09/17/2017  . 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 Cardiomyopathy ([place]) 09/17/2017  . H/O ischemic left MCA stroke 09/17/2017  . Cardiac LV ejection fraction 21-30%   . Graves disease   . Plaque psoriasis   . Neuropathy 05/19/2017    Quay Burow, OTR/L 01/05/2018, 11:39 AM  [facility] [address] [place], [place], [address] Phone: [phone]   Fax:  [phone]  Name: Margaret Ortiz MRN: [id number] Date of Birth: 10/23/1960

## 2018-01-05 NOTE — Telephone Encounter (Signed)
Pt seen less than 90 days ago for deficits after stroke. Family is calling in asking for social work referral. Order placed today. Please make them aware

## 2018-01-05 NOTE — Telephone Encounter (Signed)
Detailed message left on voice mail. Okay for [facility] nurse to give information.

## 2018-01-08 ENCOUNTER — Encounter: Payer: Self-pay | Admitting: Rehabilitation

## 2018-01-08 ENCOUNTER — Ambulatory Visit: Payer: BLUE CROSS/BLUE SHIELD | Admitting: Rehabilitation

## 2018-01-08 ENCOUNTER — Ambulatory Visit: Payer: BLUE CROSS/BLUE SHIELD | Admitting: Occupational Therapy

## 2018-01-08 DIAGNOSIS — R29818 Other symptoms and signs involving the nervous system: Secondary | ICD-10-CM

## 2018-01-08 DIAGNOSIS — M6281 Muscle weakness (generalized): Secondary | ICD-10-CM

## 2018-01-08 DIAGNOSIS — R2681 Unsteadiness on feet: Secondary | ICD-10-CM | POA: Diagnosis not present

## 2018-01-08 DIAGNOSIS — R208 Other disturbances of skin sensation: Secondary | ICD-10-CM

## 2018-01-08 DIAGNOSIS — I69351 Hemiplegia and hemiparesis following cerebral infarction affecting right dominant side: Secondary | ICD-10-CM

## 2018-01-08 NOTE — Therapy (Signed)
[facility] [address] [place], [place], [address] Phone: [phone]   Fax:  [phone]  Physical Therapy Treatment  Patient Details  Name: Margaret Ortiz MRN: [id number] Date of Birth: 08-Aug-1960 Referring Provider: Howard Pouch, DO   Encounter Date: 01/08/2018  PT End of Session - 01/08/18 2101    Visit Number  18    Number of Visits  33    Date for PT Re-Evaluation  02/27/18    Authorization Type  BCBS     PT Start Time  1533    PT Stop Time  1618    PT Time Calculation (min)  45 min    Equipment Utilized During Treatment  -- min guard to S    Activity Tolerance  Patient tolerated treatment well    Behavior During Therapy  [facility] for tasks assessed/performed       Past Medical History:  Diagnosis Date  . 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  . Cardiac LV ejection fraction 21-30%   . Cardioembolic stroke ([place]) 96/78/9381   Right hemiparesis  . Cardiomyopathy ([place]) 04/2017  . Dysphagia 05/19/2017   Last known diet upgraded to regular diet with thin liquids on 06/27/2017, no records available  . Graves disease   . Hyperlipidemia   . Hypertension   . Neuropathy 05/19/2017   s/p stroke  . Plaque psoriasis     Past Surgical History:  Procedure Laterality Date  . NO PAST SURGERIES      There were no vitals filed for this visit.  Subjective Assessment - 01/08/18 1536    Subjective  Pt reports no changes since last visit.  Chris from [place] present for formal brace assessment.     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.      Currently in Pain?  No/denies                       [facility] Adult PT  Treatment/Exercise - 01/08/18 1535      Ambulation/Gait   Ambulation/Gait  Yes    Ambulation/Gait Assistance  5: Supervision;4: Min assist    Ambulation/Gait Assistance Details  Chris from [place] present during session for formal brace assessment.  Gait x 35' initially with her personal custom AFO on RLE with quad tip cane at S, gait x 25' without cane with her personal brace, gait x 10' without AFO or AD and Min A from PT but did not provide facilitation for better understanding of deficits.  PT wanting to trial use of Spry PLS that runs laterally at ankle for improved support but also allowing pt more freedom of movement and increasing her need for motor control.  Note that our brace too large but did fit in shoe for idea of how brace will work.  Note that this brace seemed to provide great support and allowed good R foot clearance and also control at R knee.  Pt and brother in low with questions regarding brace.  Pt needing max cues to educate on why this brace did cause tightness on anterior portion of foot due to it being too large for shoe.  Gerald Stabs to order brace and deliver next Friday for  her to ensure this is right brace before filing with insurance.  Pt and family verbalized understanding.      Ambulation Distance (Feet)  -- 35, 25', 10', 115    Assistive device  Straight cane    Ambulation Surface  Level;Indoor      Neuro Re-ed    Neuro Re-ed Details   Following brace assessment ending session with NMR in sit<>stand transitions without AFO with PT providing support anteriorly to assist with placement of RUE on arm rest to ensure wrist and elbow extension as well as support at axilla and imroved R lateral weight shift, moving into standing position reaching R with LUE.  Note increased use of LLE, therefore placed LLE on balance bubble.  Pt continued to over utilize LLE, therefore had her place L foot on small green ball during transitions with marked improvement in RLE activation with tactile and  verbal cues for improved R hip extension in stance.  Note that PT did not have RUE in WB during every transition to allow rest and avoid pain in arm.               PT Education - 01/08/18 2101    Education provided  Yes    Education Details  See gait section for education on ordering new brace     Person(s) Educated  Patient;Caregiver(s)    Methods  Explanation;Demonstration    Comprehension  Verbalized understanding       PT Short Term Goals - 12/29/17 2015      PT SHORT TERM GOAL #1   Title  The patient will ambulate on level community surfaces with SPC mod indep for improved mobility.    Baseline  Currently uses SBQC in community/ SPC in home    Time  4    Period  Weeks    Status  New    Target Date  01/28/18      PT SHORT TERM GOAL #2   Title  The patient will improve gait speed from 0.85 ft/sec to > or equal to 1.2 ft/sec to demonstrate transition from "household ambulator" to "limited community ambulator" classification of gait.    Baseline  0.96 ft/sec at updated goals    Time  4    Period  Weeks    Status  Revised    Target Date  01/28/18      PT SHORT TERM GOAL #3   Title  The patient will improve Berg balance score from 42/56 to > or equal to 45/56 to demo dec'ing risk for falls.    Time  4    Period  Weeks    Status  Revised    Target Date  01/28/18      PT SHORT TERM GOAL #4   Title  The patient will ambulate x 600 ft nonstop to demo improved endurance for functional gait.    Baseline  (424 ft nonstop is max documented)    Time  4    Period  Weeks    Status  Revised    Target Date  01/28/18        PT Long Term Goals - 12/29/17 2012      PT LONG TERM GOAL #1   Title  The patient will perform updated HEP with intermittent assist for post d/c progression of exercise.    Time  8    Period  Weeks    Status  New    Target Date  02/27/18  PT LONG TERM GOAL #2   Title  The patient will move floor<>stand with mod indep using L UE support on  surface due to h/o falls.    Time  8    Period  Weeks    Status  Revised    Target Date  02/27/18      PT LONG TERM GOAL #3   Title  The patient will improve gait speed from 0.85 ft/sec to > or equal to 1.5 ft/sec to demo dec'ing risk for falls.    Baseline  0.98 ft.sec on 12/22/2017    Time  8    Period  Weeks    Status  Revised    Target Date  02/27/18      PT LONG TERM GOAL #4   Title  The patient will improve Berg score from 42/56 (on 12/22/2017) up to 48/56 to demo dec'ing risk for falls.    Time  8    Period  Weeks    Status  Revised    Target Date  02/27/18      PT LONG TERM GOAL #5   Title  The patient will negotiate 12 steps without a handrail with step to pattern mod indep to access home entries.    Baseline  one handrail mod indep with step to pattern 12/22/2017    Time  8    Period  Weeks    Status  Revised    Target Date  02/27/18            Plan - 01/08/18 2101    Clinical Impression Statement  Skilled session focused on formal brace assessment with Gerald Stabs from [place].  Decided that Spry PLS AFO would provide best support and knee control, therefore Gerald Stabs to bring next week for her to ensure that this is appropriate brace as our brace was slightly large for her shoe.  continue to address RLE NMR with closed chain and forced use exercises.  Pt making steady progress towards goals.      PT Treatment/Interventions  ADLs/Self Care Home Management;Gait training;Stair training;Functional mobility training;Therapeutic activities;Therapeutic exercise;Balance training;Neuromuscular re-education;Patient/family education;Orthotic Fit/Training;Electrical Stimulation;Manual techniques;DME Instruction    PT Next Visit Plan  Gerald Stabs to bring brace for use by 7/26, then let him know to place order if all is well, Continue with Bioness (tablet 1) for both lower and upper unit RLE, R side strengthening, hip initiation during gait tasks, right LE loading/weight shifting.  gait training (in  clinic without SBQC to encourage more reciprocal pattern),     Consulted and Agree with Plan of Care  Patient       Patient will benefit from skilled therapeutic intervention in order to improve the following deficits and impairments:  Abnormal gait, Impaired sensation, Pain, Postural dysfunction, Impaired tone, Decreased mobility, Decreased coordination, Decreased activity tolerance, Decreased endurance, Decreased strength, Difficulty walking, Decreased balance  Visit Diagnosis: Hemiplegia and hemiparesis following cerebral infarction affecting right dominant side (HCC)  Unsteadiness on feet  Muscle weakness (generalized)  Other symptoms and signs involving the nervous system     Problem List Patient Active Problem List   Diagnosis Date Noted  . Fall 09/17/2017  . Hemiparesis of right dominant side as late effect of cerebral infarction ([place]) 09/17/2017  . Shoulder subluxation, right, initial encounter 09/17/2017  . Cardioembolic stroke ([place]) 12/45/8099  . Weakness 09/17/2017  . Aphasia 09/17/2017  . 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 Cardiomyopathy ([place]) 09/17/2017  . H/O ischemic left MCA  stroke 09/17/2017  . Cardiac LV ejection fraction 21-30%   . Graves disease   . Plaque psoriasis   . Neuropathy 05/19/2017    Cameron Sprang, PT, MPT [facility] [address] [place], [place], [address] Phone: [phone]   Fax:  [phone] 01/08/18, 9:05 PM  Name: Margaret Ortiz MRN: [id number] Date of Birth: 1961-05-28

## 2018-01-08 NOTE — Therapy (Signed)
[facility] [address] [place], [place], [address] Phone: [phone]   Fax:  [phone]  Occupational Therapy Treatment  Patient Details  Name: Margaret Ortiz MRN: [id number] Date of Birth: [birth year] Referring Provider: Howard Pouch, DO   Encounter Date: 01/08/2018  OT End of Session - 01/08/18 1701    Visit Number  24    Number of Visits  36    Date for OT Re-Evaluation  02/20/18    Authorization Type  BC/BS    OT Start Time  1445    OT Stop Time  1530    OT Time Calculation (min)  45 min    Activity Tolerance  Patient tolerated treatment well    Behavior During Therapy  [facility] for tasks assessed/performed       Past Medical History:  Diagnosis Date  . 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  . Cardiac LV ejection fraction 21-30%   . Cardioembolic stroke ([place]) 76/54/6503   Right hemiparesis  . Cardiomyopathy ([place]) 04/2017  . Dysphagia 05/19/2017   Last known diet upgraded to regular diet with thin liquids on 06/27/2017, no records available  . Graves disease   . Hyperlipidemia   . Hypertension   . Neuropathy 05/19/2017   s/p stroke  . Plaque psoriasis     Past Surgical History:  Procedure Laterality Date  . NO PAST SURGERIES      There were no vitals filed for this visit.  Subjective Assessment - 01/08/18 1656    Patient is accompained by:  Family member Brother-n-law    Pertinent History  Lt MCA CVA 05/19/2017, Graves dz    Patient Stated Goals  use my Rt arm    Currently in Pain?  No/denies                   OT Treatments/Exercises (OP) - 01/08/18 0001      ADLs   ADL Comments  Further discussed aquatic therapy (AT) and POC going forward. Pt agrees to AT and therefore adjusted schedule for this. Therapist also reinforced need to practice functional tasks/IADLS at home with family supervision  in prep for recognizing and addressing potential problems/difficulties. Pt still is not doing any kind of meal prep      Neurological Re-education Exercises   Other Exercises 1  Continued neuro re-education to RUE in AA/ROM, and wt bearing with wt shifts to Rt side both seated and in standing with tactile and simple verbal cues/mod to max facilitation.                OT Short Term Goals - 01/05/18 1137      OT SHORT TERM GOAL #6   Title  Pt to demo 25 degrees shoulder flexion in prep for low level reaching with gross finger flexion - goals due 01/21/2018    Time  4    Period  Weeks    Status  On-going      OT SHORT TERM GOAL #7   Title  Pt to perform environmental scanning at 90% accuracy during ambulation    Time  4    Period  Weeks    Status  On-going        OT Long Term Goals - 01/05/18 1137      OT LONG TERM GOAL #1   Title  Pt to perform shower transfers at mod I level w/ DME  prn and no LOB - goals due 02/18/2018    Time  8    Period  Weeks    Status  On-going Pt requires supervision to min assist at this time secondary to not having grab bars, other A/E in shower yet      OT LONG TERM GOAL #2   Title  Pt to perform simple meal prep at sup level using A/E and task modifications prn    Time  8    Period  Weeks    Status  On-going Pt now agrees to work on this again      OT [place] #3   Title  Pt to perform light house management tasks at sup level including: laundry, washing dishes, cleaning, making bed    Time  8    Period  Weeks    Status  On-going Pt reports she is already washing dishes      OT LONG TERM GOAL #4   Title  Pt to demo 25% finger extension in prep for releasing objects Rt hand     Time  8    Period  Weeks    Status  On-going      OT LONG TERM GOAL #5   Title  (LTG #5 moved to STG for renewal period)      OT LONG TERM GOAL #6   Title  Pt to use Rt hand as stabalizer 25% of the time    Time  8    Period  Weeks    Status   On-going      OT LONG TERM GOAL #7   Title  Pt/family to verbalize understanding with neccessary accommodations, A/E, etc that would be needed if pt is to return to living alone    Time  8    Period  Weeks    Status  On-going            Plan - 01/08/18 1701    Clinical Impression Statement  Pt unable to fully understand POC and reasons for anticipated d/c within the following 2 months. Pt continues to want to focus primarily on arm, however therapist explained benefits of working towards safety/independence with ADLS as well.     Occupational Profile and client history currently impacting functional performance  no significant PMH but current impairments extensive    Occupational performance deficits (Please refer to evaluation for details):  ADL's;IADL's;Work;Leisure;Social Participation    Rehab Potential  Fair    Current Impairments/barriers affecting progress:  severity of deficits    OT Frequency  2x / week    OT Duration  8 weeks    OT Treatment/Interventions  Self-care/ADL training;Moist Heat;DME and/or AE instruction;Splinting;Therapeutic activities;Psychosocial skills training;Aquatic Therapy;Cognitive remediation/compensation;Therapeutic exercise;Coping strategies training;Neuromuscular education;Functional Mobility Training;Passive range of motion;Visual/perceptual remediation/compensation;Electrical Stimulation;Manual Therapy;Patient/family education    Plan  continue NMR for RUE, trunk, balance ,transitional movements functional mobility, estim    Consulted and Agree with Plan of Care  Patient    Family Member Consulted  brother-n-law        Patient will benefit from skilled therapeutic intervention in order to improve the following deficits and impairments:  Decreased coordination, Decreased range of motion, Difficulty walking, Improper body mechanics, Decreased endurance, Decreased safety awareness, Impaired sensation, Improper spinal/pelvic alignment, Impaired tone,  Decreased knowledge of precautions, Decreased knowledge of use of DME, Decreased balance, Impaired UE functional use, Pain, Decreased cognition, Decreased mobility, Decreased strength, Impaired perceived functional ability, Impaired vision/preception  Visit Diagnosis: Hemiplegia and  hemiparesis following cerebral infarction affecting right dominant side (HCC)  Other symptoms and signs involving the nervous system  Other disturbances of skin sensation    Problem List Patient Active Problem List   Diagnosis Date Noted  . Fall 09/17/2017  . Hemiparesis of right dominant side as late effect of cerebral infarction ([place]) 09/17/2017  . Shoulder subluxation, right, initial encounter 09/17/2017  . Cardioembolic stroke ([place]) 89/79/1504  . Weakness 09/17/2017  . Aphasia 09/17/2017  . 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 Cardiomyopathy ([place]) 09/17/2017  . H/O ischemic left MCA stroke 09/17/2017  . Cardiac LV ejection fraction 21-30%   . Graves disease   . Plaque psoriasis   . Neuropathy 05/19/2017    Carey Bullocks, OTR/L 01/08/2018, 5:04 PM  [facility] [address] [place], [place], [address] Phone: [phone]   Fax:  [phone]  Name: Margaret Ortiz MRN: [id number] Date of Birth: 1961/01/16

## 2018-01-12 ENCOUNTER — Encounter: Payer: Self-pay | Admitting: Rehabilitation

## 2018-01-12 ENCOUNTER — Ambulatory Visit: Payer: BLUE CROSS/BLUE SHIELD | Admitting: Occupational Therapy

## 2018-01-12 ENCOUNTER — Ambulatory Visit: Payer: BLUE CROSS/BLUE SHIELD | Admitting: Rehabilitation

## 2018-01-12 ENCOUNTER — Encounter: Payer: BLUE CROSS/BLUE SHIELD | Admitting: Occupational Therapy

## 2018-01-12 DIAGNOSIS — R29818 Other symptoms and signs involving the nervous system: Secondary | ICD-10-CM

## 2018-01-12 DIAGNOSIS — R2681 Unsteadiness on feet: Secondary | ICD-10-CM

## 2018-01-12 DIAGNOSIS — I69351 Hemiplegia and hemiparesis following cerebral infarction affecting right dominant side: Secondary | ICD-10-CM

## 2018-01-12 DIAGNOSIS — R208 Other disturbances of skin sensation: Secondary | ICD-10-CM

## 2018-01-12 DIAGNOSIS — M6281 Muscle weakness (generalized): Secondary | ICD-10-CM

## 2018-01-12 DIAGNOSIS — R2689 Other abnormalities of gait and mobility: Secondary | ICD-10-CM

## 2018-01-12 NOTE — Therapy (Signed)
[facility] [address] [place], [place], [address] Phone: [phone]   Fax:  [phone]  Occupational Therapy Treatment  Patient Details  Name: Margaret Ortiz MRN: [id number] Date of Birth: Apr 29, 1961 Referring Provider: Howard Pouch, DO   Encounter Date: 01/12/2018  OT End of Session - 01/12/18 1207    Visit Number  25    Number of Visits  36    Date for OT Re-Evaluation  02/20/18    Authorization Type  BC/BS    OT Start Time  1100    OT Stop Time  1145    OT Time Calculation (min)  45 min    Activity Tolerance  Patient tolerated treatment well    Behavior During Therapy  [facility] for tasks assessed/performed       Past Medical History:  Diagnosis Date  . 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  . Cardiac LV ejection fraction 21-30%   . Cardioembolic stroke ([place]) 01/60/1093   Right hemiparesis  . Cardiomyopathy ([place]) 04/2017  . Dysphagia 05/19/2017   Last known diet upgraded to regular diet with thin liquids on 06/27/2017, no records available  . Graves disease   . Hyperlipidemia   . Hypertension   . Neuropathy 05/19/2017   s/p stroke  . Plaque psoriasis     Past Surgical History:  Procedure Laterality Date  . NO PAST SURGERIES      There were no vitals filed for this visit.  Subjective Assessment - 01/12/18 1140    Subjective   Per brother-n-law, "she feels like her arm is getting stronger"    Pertinent History  Lt MCA CVA 05/19/2017, Graves dz    Patient Stated Goals  use my Rt arm    Currently in Pain?  No/denies                   OT Treatments/Exercises (OP) - 01/12/18 0001      Neurological Re-education Exercises   Other Exercises 2  AA/ROM RUE with PVC pipe: seated for assisted sh. flex, and resistive sh. ext with mod facilitation from therapist. Standing using PVC pipe for elbow ext/triceps  with mod facilitation    Other Weight-Bearing Exercises 1  BUE wt bearing for sit to low level squatting position followed by holding position w/ small wt shifts to Rt side, then progressed to disengaging LUE. Seated: wt bearing over RUE with reaching and trunk rotation on Lt     Other Weight-Bearing Exercises 2  Wt bearing over elbows on K bench in tall kneeling with A/P wt shifts, Rt/Lt wt shifts, followed by disengaging LUE to increase wt over Rt side. Progressed to modified push ups over K bench in tall kneeling.  Seated: wt bearing over Rt elbow with arm on body movements.  Seated: wt bearing over Rt arm (elbow extended) w/ lateral trunk flexion while performing high level ipsilateral reaching LUE       Electrical Stimulation   Electrical Stimulation Location  dorsal forearm    Electrical Stimulation Action  wrist and finger extension    Electrical Stimulation Parameters  50 pps, 250 pw, 10 sec. on/off cycle, x 10 min    Electrical Stimulation Goals  Neuromuscular facilitation               OT Short Term Goals - 01/05/18 1137      OT SHORT TERM GOAL #6   Title  Pt to demo 25 degrees shoulder flexion in prep for low level reaching with gross finger flexion - goals due 01/21/2018    Time  4    Period  Weeks    Status  On-going      OT SHORT TERM GOAL #7   Title  Pt to perform environmental scanning at 90% accuracy during ambulation    Time  4    Period  Weeks    Status  On-going        OT Long Term Goals - 01/05/18 1137      OT LONG TERM GOAL #1   Title  Pt to perform shower transfers at mod I level w/ DME prn and no LOB - goals due 02/18/2018    Time  8    Period  Weeks    Status  On-going Pt requires supervision to min assist at this time secondary to not having grab bars, other A/E in shower yet      OT LONG TERM GOAL #2   Title  Pt to perform simple meal prep at sup level using A/E and task modifications prn    Time  8    Period  Weeks    Status  On-going Pt now  agrees to work on this again      OT [place] #3   Title  Pt to perform light house management tasks at sup level including: laundry, washing dishes, cleaning, making bed    Time  8    Period  Weeks    Status  On-going Pt reports she is already washing dishes      OT LONG TERM GOAL #4   Title  Pt to demo 25% finger extension in prep for releasing objects Rt hand     Time  8    Period  Weeks    Status  On-going      OT LONG TERM GOAL #5   Title  (LTG #5 moved to STG for renewal period)      OT LONG TERM GOAL #6   Title  Pt to use Rt hand as stabalizer 25% of the time    Time  8    Period  Weeks    Status  On-going      OT LONG TERM GOAL #7   Title  Pt/family to verbalize understanding with neccessary accommodations, A/E, etc that would be needed if pt is to return to living alone    Time  8    Period  Weeks    Status  On-going            Plan - 01/12/18 1208    Clinical Impression Statement  Pt gradually progressing with shoulder activation during closed chain activities.     Occupational Profile and client history currently impacting functional performance  no significant PMH but current impairments extensive    Occupational performance deficits (Please refer to evaluation for details):  ADL's;IADL's;Work;Leisure;Social Participation    Rehab Potential  Fair    Current Impairments/barriers affecting progress:  severity of deficits    OT Frequency  2x / week    OT Duration  8 weeks    OT Treatment/Interventions  Self-care/ADL training;Moist Heat;DME and/or AE instruction;Splinting;Therapeutic activities;Psychosocial skills training;Aquatic Therapy;Cognitive remediation/compensation;Therapeutic exercise;Coping strategies training;Neuromuscular education;Functional Mobility Training;Passive range of motion;Visual/perceptual remediation/compensation;Electrical Stimulation;Manual Therapy;Patient/family education    Plan  continue NMR for RUE, trunk, balance ,transitional  movements functional mobility, estim    Consulted and Agree with Plan  of Care  Patient;Family member/caregiver    Family Member Consulted  brother-n-law        Patient will benefit from skilled therapeutic intervention in order to improve the following deficits and impairments:  Decreased coordination, Decreased range of motion, Difficulty walking, Improper body mechanics, Decreased endurance, Decreased safety awareness, Impaired sensation, Improper spinal/pelvic alignment, Impaired tone, Decreased knowledge of precautions, Decreased knowledge of use of DME, Decreased balance, Impaired UE functional use, Pain, Decreased cognition, Decreased mobility, Decreased strength, Impaired perceived functional ability, Impaired vision/preception  Visit Diagnosis: Hemiplegia and hemiparesis following cerebral infarction affecting right dominant side (HCC)  Other symptoms and signs involving the nervous system  Other disturbances of skin sensation  Unsteadiness on feet    Problem List Patient Active Problem List   Diagnosis Date Noted  . Fall 09/17/2017  . Hemiparesis of right dominant side as late effect of cerebral infarction ([place]) 09/17/2017  . Shoulder subluxation, right, initial encounter 09/17/2017  . Cardioembolic stroke ([place]) 41/71/2787  . Weakness 09/17/2017  . Aphasia 09/17/2017  . 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 Cardiomyopathy ([place]) 09/17/2017  . H/O ischemic left MCA stroke 09/17/2017  . Cardiac LV ejection fraction 21-30%   . Graves disease   . Plaque psoriasis   . Neuropathy 05/19/2017    Carey Bullocks, OTR/L 01/12/2018, 12:09 PM  [facility] [address] [place], [place], [address] Phone: [phone]   Fax:  [phone]  Name: Margaret Ortiz MRN: [id number] Date of Birth: 1961-06-21

## 2018-01-12 NOTE — Therapy (Signed)
[facility] [address] [place], [place], [address] Phone: [phone]   Fax:  [phone]  Physical Therapy Treatment  Patient Details  Name: Margaret Ortiz MRN: [id number] Date of Birth: 1960-07-01 Referring Provider: Howard Pouch, DO   Encounter Date: 01/12/2018  PT End of Session - 01/12/18 2007    Visit Number  19    Number of Visits  33    Date for PT Re-Evaluation  02/27/18    Authorization Type  BCBS     PT Start Time  1149    PT Stop Time  1235    PT Time Calculation (min)  46 min    Equipment Utilized During Treatment  -- min guard to S    Activity Tolerance  Patient tolerated treatment well    Behavior During Therapy  [facility] for tasks assessed/performed       Past Medical History:  Diagnosis Date  . 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  . Cardiac LV ejection fraction 21-30%   . Cardioembolic stroke ([place]) 81/82/9937   Right hemiparesis  . Cardiomyopathy ([place]) 04/2017  . Dysphagia 05/19/2017   Last known diet upgraded to regular diet with thin liquids on 06/27/2017, no records available  . Graves disease   . Hyperlipidemia   . Hypertension   . Neuropathy 05/19/2017   s/p stroke  . Plaque psoriasis     Past Surgical History:  Procedure Laterality Date  . NO PAST SURGERIES      There were no vitals filed for this visit.  Subjective Assessment - 01/12/18 1952    Subjective  Pt asking about ambulation without cane and outdoor gait with SPC.     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      Currently in Pain?  No/denies                       [facility] Adult PT Treatment/Exercise - 01/12/18 1149      Ambulation/Gait   Ambulation/Gait  Yes    Ambulation/Gait Assistance  4: Min assist    Ambulation/Gait Assistance Details  Utilized Bioness during gait for R DF assist as well as improved R knee control during stance phase of gait.  Pt ambulated with [facility] initially, transitioning to no AD for majority of session to allow pt to address postural control, improving R lateral/forward weight shift with R hip protraction, decreasing R step length, increasing L step length and increasing speed of R step for more normalized gait speed/pattern.  PT providing facilitation at pelvis for decreased lateral movement and more anterior/lateral weight shift onto RLE.  Also assist to initiate R step quicker than she is initiating.  Did note that following NMR tasks, gait with SBQC and SPC demo'd improved upright psoture and improved stepping sequence.  Ended session with gait with SPC with her AFO to assess outdoor ambulation with [facility] as pt wanting to do here in clinic to ensure safety at home.  PT reluctantly okayed her to do this at home, however would recommend more practice in sessions to increase independence.     Ambulation Distance (Feet)  200 Feet 115, 300'    Assistive device  Small  based quad cane;Straight cane;None    Gait Pattern  Decreased stance time - right;Decreased step length - left;Decreased stride length;Decreased hip/knee flexion - right;Decreased dorsiflexion - right;Decreased weight shift to right;Right circumduction;Right hip hike;Lateral hip instability;Poor foot clearance - right    Ambulation Surface  Level;Indoor;Outdoor;Paved      Self-Care   Self-Care  Other Self-Care Comments    Other Self-Care Comments   Continue to have lengthy discussion with pt and brother in law regarding reasons for use of SPC at all times to avoid poor gait compensations and increased difficulty breaking these poor gait qualities.  Also discussed reason for caution with gait outdoors with SPC only.  Recommended S from  family initially, however pt wanting to do in clinic as she did not want to have family with her.  She was able to do during session safely, therefore okayed her to do so since she ambulates on level paved surface at home.       Neuro Re-ed    Neuro Re-ed Details   Utilized Bioness stimulation in training mode during all NMR tasks for improved R DF assist and improved R proximal LE control in stance.  RLE step ups with L LE tapping to 3rd step and returning to ground x 10 reps with assist from PT to guide RLE into step and assist with adequate weight shift onto RLE.  Tall kneeling squats x 10 reps without UE support, tall kneeling to R half kneeling transitions x 10 reps with PT providing UE support to decrease reliance on UEs.  Tactile cues at R hip for continued activation, R half kneeling decreasing UE support.  Note that she has great activation of R proximal hip muscles, however is unable to maintain contraction longer than 10 secs or so before needing UE support.  Pt able to get down into floor and back to mat with min/guard A for safety.        Acupuncturist Location  R anterior tibialis, R hamstring    Electrical Stimulation Action  R ankle DF and prevention of R genu recurvatum    Electrical Stimulation Parameters  See tablet 1 for details    Electrical Stimulation Goals  Neuromuscular facilitation             PT Education - 01/12/18 2007    Education provided  Yes    Education Details  see self care    Person(s) Educated  Patient;Caregiver(s)    Methods  Explanation    Comprehension  Verbalized understanding       PT Short Term Goals - 12/29/17 2015      PT SHORT TERM GOAL #1   Title  The patient will ambulate on level community surfaces with SPC mod indep for improved mobility.    Baseline  Currently uses SBQC in community/ SPC in home    Time  4    Period  Weeks    Status  New    Target Date  01/28/18      PT SHORT TERM GOAL #2    Title  The patient will improve gait speed from 0.85 ft/sec to > or equal to 1.2 ft/sec to demonstrate transition from "household ambulator" to "limited community ambulator" classification of gait.    Baseline  0.96 ft/sec at updated goals    Time  4    Period  Weeks    Status  Revised    Target Date  01/28/18  PT SHORT TERM GOAL #3   Title  The patient will improve Berg balance score from 42/56 to > or equal to 45/56 to demo dec'ing risk for falls.    Time  4    Period  Weeks    Status  Revised    Target Date  01/28/18      PT SHORT TERM GOAL #4   Title  The patient will ambulate x 600 ft nonstop to demo improved endurance for functional gait.    Baseline  (424 ft nonstop is max documented)    Time  4    Period  Weeks    Status  Revised    Target Date  01/28/18        PT Long Term Goals - 12/29/17 2012      PT LONG TERM GOAL #1   Title  The patient will perform updated HEP with intermittent assist for post d/c progression of exercise.    Time  8    Period  Weeks    Status  New    Target Date  02/27/18      PT LONG TERM GOAL #2   Title  The patient will move floor<>stand with mod indep using L UE support on surface due to h/o falls.    Time  8    Period  Weeks    Status  Revised    Target Date  02/27/18      PT LONG TERM GOAL #3   Title  The patient will improve gait speed from 0.85 ft/sec to > or equal to 1.5 ft/sec to demo dec'ing risk for falls.    Baseline  0.98 ft.sec on 12/22/2017    Time  8    Period  Weeks    Status  Revised    Target Date  02/27/18      PT LONG TERM GOAL #4   Title  The patient will improve Berg score from 42/56 (on 12/22/2017) up to 48/56 to demo dec'ing risk for falls.    Time  8    Period  Weeks    Status  Revised    Target Date  02/27/18      PT LONG TERM GOAL #5   Title  The patient will negotiate 12 steps without a handrail with step to pattern mod indep to access home entries.    Baseline  one handrail mod indep with step to  pattern 12/22/2017    Time  8    Period  Weeks    Status  Revised    Target Date  02/27/18            Plan - 01/12/18 2007    Clinical Impression Statement  Pt continues to tolerate Bioness very well during session during NMR and gait tasks.  She continues to demonstrate only mild carryover following use of Bioness and RLE fatigues quickly during session.  PT okayed pt to ambulate outdoors with SPC.     PT Treatment/Interventions  ADLs/Self Care Home Management;Gait training;Stair training;Functional mobility training;Therapeutic activities;Therapeutic exercise;Balance training;Neuromuscular re-education;Patient/family education;Orthotic Fit/Training;Electrical Stimulation;Manual techniques;DME Instruction    PT Next Visit Plan  Gerald Stabs to bring brace for use by 7/26, then let him know to place order if all is well, Continue with Bioness (tablet 1) for both lower and upper unit RLE, R side strengthening, hip initiation during gait tasks, right LE loading/weight shifting.  gait training (in clinic without SBQC to encourage more reciprocal pattern),  Consulted and Agree with Plan of Care  Patient       Patient will benefit from skilled therapeutic intervention in order to improve the following deficits and impairments:  Abnormal gait, Impaired sensation, Pain, Postural dysfunction, Impaired tone, Decreased mobility, Decreased coordination, Decreased activity tolerance, Decreased endurance, Decreased strength, Difficulty walking, Decreased balance  Visit Diagnosis: Hemiplegia and hemiparesis following cerebral infarction affecting right dominant side (HCC)  Unsteadiness on feet  Muscle weakness (generalized)  Other abnormalities of gait and mobility     Problem List Patient Active Problem List   Diagnosis Date Noted  . Fall 09/17/2017  . 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 Shoulder subluxation, right, initial encounter  09/17/2017  . Cardioembolic stroke ([place]) 35/67/0141  . Weakness 09/17/2017  . Aphasia 09/17/2017  . 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 Cardiomyopathy ([place]) 09/17/2017  . H/O ischemic left MCA stroke 09/17/2017  . Cardiac LV ejection fraction 21-30%   . Graves disease   . Plaque psoriasis   . Neuropathy 05/19/2017    Cameron Sprang, PT, MPT [facility] [address] [place], [place], [address] Phone: [phone]   Fax:  [phone] 01/12/18, 8:10 PM  Name: Margaret Ortiz MRN: [id number] Date of Birth: [birth year]

## 2018-01-13 ENCOUNTER — Telehealth: Payer: Self-pay | Admitting: *Deleted

## 2018-01-13 NOTE — Telephone Encounter (Signed)
Left detailed message on patient's brother in law cell letting them know patient does not qualify for SW since she is not homebound. Instructed them to call back if they had any questions.

## 2018-01-15 ENCOUNTER — Other Ambulatory Visit: Payer: Self-pay

## 2018-01-16 ENCOUNTER — Ambulatory Visit: Payer: BLUE CROSS/BLUE SHIELD | Admitting: Rehabilitation

## 2018-01-16 ENCOUNTER — Encounter: Payer: Self-pay | Admitting: Rehabilitation

## 2018-01-16 ENCOUNTER — Ambulatory Visit: Payer: BLUE CROSS/BLUE SHIELD

## 2018-01-16 DIAGNOSIS — M6281 Muscle weakness (generalized): Secondary | ICD-10-CM

## 2018-01-16 DIAGNOSIS — R4701 Aphasia: Secondary | ICD-10-CM

## 2018-01-16 DIAGNOSIS — R41841 Cognitive communication deficit: Secondary | ICD-10-CM

## 2018-01-16 DIAGNOSIS — R29818 Other symptoms and signs involving the nervous system: Secondary | ICD-10-CM

## 2018-01-16 DIAGNOSIS — I69351 Hemiplegia and hemiparesis following cerebral infarction affecting right dominant side: Secondary | ICD-10-CM

## 2018-01-16 DIAGNOSIS — R2689 Other abnormalities of gait and mobility: Secondary | ICD-10-CM

## 2018-01-16 DIAGNOSIS — R2681 Unsteadiness on feet: Secondary | ICD-10-CM

## 2018-01-16 NOTE — Therapy (Signed)
[facility] [address] [place], [place], [address] Phone: [phone]   Fax:  [phone]  Physical Therapy Treatment  Patient Details  Name: Margaret Ortiz MRN: [id number] Date of Birth: 30-Jul-1960 Referring Provider: Howard Pouch, DO   Encounter Date: 01/16/2018  PT End of Session - 01/16/18 1246    Visit Number  20    Number of Visits  33    Date for PT Re-Evaluation  02/27/18    Authorization Type  BCBS     PT Start Time  0933    PT Stop Time  1018    PT Time Calculation (min)  45 min    Equipment Utilized During Treatment  -- min guard to S    Activity Tolerance  Patient tolerated treatment well    Behavior During Therapy  [facility] for tasks assessed/performed       Past Medical History:  Diagnosis Date  . 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  . Cardiac LV ejection fraction 21-30%   . Cardioembolic stroke ([place]) 93/90/3009   Right hemiparesis  . Cardiomyopathy ([place]) 04/2017  . Dysphagia 05/19/2017   Last known diet upgraded to regular diet with thin liquids on 06/27/2017, no records available  . Graves disease   . Hyperlipidemia   . Hypertension   . Neuropathy 05/19/2017   s/p stroke  . Plaque psoriasis     Past Surgical History:  Procedure Laterality Date  . NO PAST SURGERIES      There were no vitals filed for this visit.  Subjective Assessment - 01/16/18 1244    Subjective  Pt stating she had trouble sleeping due to difficulty positioning RUE.     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      Currently in Pain?  No/denies             Ortho fit training: Pt received Spry Step PLS AFO for RLE during session, therefore spent time to gait train and  ensure proper fitting for improved quality of gait at home with [facility].  Note that initially there was increased movement in shoe due to shoe being larger to accommodate her custom AFO.  PT was able to remove shoe button and lace up to next islet for improved fit, however feel that she would benefit from having smaller lace up shoe so that brace can work properly.  Educated pt on this and made hand written note to family, however would like to continue this education in next session if able.  Provided tactile cues at R pelvis for decreased hip hike during swing and for improved R hip protraction/weight shift during stance phase of gait.  Note decreased R knee flex during swing phase causing R hip hike.  Provided tactile cues at knee during swing with mild improvement and definite improvement in gait speed with good reciprocal pattern noted.  Therefore, began to utilize Bioness for R thigh on hamstrings to activate hamstrings during swing phase of gait for improved knee flexion and improved clearance.    NMR:  Bioness on R thigh (lower unit placed over AFO but disabled) to increase R knee flex during swing phase of gait.  Continued to provide tactile cues at pelvis as mentioned above.  Also had pt work  on forward advancement of RLE upt o 6" step and down with use of Bioness in training mode x 10 reps with PT providing manual assist as needed for proper activation and less trunk compensation.                    PT Education - 01/16/18 1245    Education provided  Yes    Education Details  educated and provided written note to family regarding using smaller lace up shoes with brace to allow brace to work more adequately.      Person(s) Educated  Patient    Methods  Explanation;Handout    Comprehension  Verbalized understanding       PT Short Term Goals - 12/29/17 2015      PT SHORT TERM GOAL #1   Title  The patient will ambulate on level community surfaces with SPC mod indep for improved  mobility.    Baseline  Currently uses SBQC in community/ SPC in home    Time  4    Period  Weeks    Status  New    Target Date  01/28/18      PT SHORT TERM GOAL #2   Title  The patient will improve gait speed from 0.85 ft/sec to > or equal to 1.2 ft/sec to demonstrate transition from "household ambulator" to "limited community ambulator" classification of gait.    Baseline  0.96 ft/sec at updated goals    Time  4    Period  Weeks    Status  Revised    Target Date  01/28/18      PT SHORT TERM GOAL #3   Title  The patient will improve Berg balance score from 42/56 to > or equal to 45/56 to demo dec'ing risk for falls.    Time  4    Period  Weeks    Status  Revised    Target Date  01/28/18      PT SHORT TERM GOAL #4   Title  The patient will ambulate x 600 ft nonstop to demo improved endurance for functional gait.    Baseline  (424 ft nonstop is max documented)    Time  4    Period  Weeks    Status  Revised    Target Date  01/28/18        PT Long Term Goals - 12/29/17 2012      PT LONG TERM GOAL #1   Title  The patient will perform updated HEP with intermittent assist for post d/c progression of exercise.    Time  8    Period  Weeks    Status  New    Target Date  02/27/18      PT LONG TERM GOAL #2   Title  The patient will move floor<>stand with mod indep using L UE support on surface due to h/o falls.    Time  8    Period  Weeks    Status  Revised    Target Date  02/27/18      PT LONG TERM GOAL #3   Title  The patient will improve gait speed from 0.85 ft/sec to > or equal to 1.5 ft/sec to demo dec'ing risk for falls.    Baseline  0.98 ft.sec on 12/22/2017    Time  8    Period  Weeks    Status  Revised    Target Date  02/27/18  PT LONG TERM GOAL #4   Title  The patient will improve Berg score from 42/56 (on 12/22/2017) up to 48/56 to demo dec'ing risk for falls.    Time  8    Period  Weeks    Status  Revised    Target Date  02/27/18      PT LONG TERM GOAL  #5   Title  The patient will negotiate 12 steps without a handrail with step to pattern mod indep to access home entries.    Baseline  one handrail mod indep with step to pattern 12/22/2017    Time  8    Period  Weeks    Status  Revised    Target Date  02/27/18            Plan - 01/16/18 1246    Clinical Impression Statement  Skilled session focused on gait training with use of new Spry Step PLS AFO on RLE (trimmed to fit pt) with use of SPC.  Note initially that her shoe too large and foot/brace moving inside of shoe, therefore removed shoe button and laced higher and tighter which improved gait with brace.  Recommend she get smaller shoe (lace up) so that brace can work more adequately (pt with some difficulty understanding, however family not present, provided pt with note to explain to family).  Also note decreased R knee flex during swing causing R hip hike, therefore utilized Bioness thigh unit to address.  Pt tolerated well and note marked improvement in gait speed during session.     PT Treatment/Interventions  ADLs/Self Care Home Management;Gait training;Stair training;Functional mobility training;Therapeutic activities;Therapeutic exercise;Balance training;Neuromuscular re-education;Patient/family education;Orthotic Fit/Training;Electrical Stimulation;Manual techniques;DME Instruction    PT Next Visit Plan  Make sure that family understood about getting smaller lace up shoes for brace.  Also Sheppard Evens (Em and Susanne's pt) would like to exchange contact info with jaheda, but I wanted to make sure family was here to discuss with them before doing.  Continue with Bioness (tablet 1) for both lower and upper unit RLE, R side strengthening, hip initiation during gait tasks, right LE loading/weight shifting.  gait training (in clinic without SBQC to encourage more reciprocal pattern),     Consulted and Agree with Plan of Care  Patient       Patient will benefit from skilled therapeutic  intervention in order to improve the following deficits and impairments:  Abnormal gait, Impaired sensation, Pain, Postural dysfunction, Impaired tone, Decreased mobility, Decreased coordination, Decreased activity tolerance, Decreased endurance, Decreased strength, Difficulty walking, Decreased balance  Visit Diagnosis: Hemiplegia and hemiparesis following cerebral infarction affecting right dominant side (HCC)  Other symptoms and signs involving the nervous system  Unsteadiness on feet  Muscle weakness (generalized)  Other abnormalities of gait and mobility     Problem List Patient Active Problem List   Diagnosis Date Noted  . Fall 09/17/2017  . Hemiparesis of right dominant side as late effect of cerebral infarction ([place]) 09/17/2017  . Shoulder subluxation, right, initial encounter 09/17/2017  . Cardioembolic stroke ([place]) 09/62/8366  . Weakness 09/17/2017  . Aphasia 09/17/2017  . 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 Cardiomyopathy ([place]) 09/17/2017  . H/O ischemic left MCA stroke 09/17/2017  . Cardiac LV ejection fraction 21-30%   . Graves disease   . Plaque psoriasis   . Neuropathy 05/19/2017    Cameron Sprang, PT, MPT [facility] [address] [place], [place], [address] Phone: [phone]  Fax:  [phone] 01/16/18, 12:56 PM  Name: Alise Calais MRN: [id number] Date of Birth: 06-17-1961

## 2018-01-16 NOTE — Therapy (Signed)
[facility] [address] [place], [place], [address] Phone: [phone]   Fax:  [phone]  Speech Language Pathology Treatment  Patient Details  Name: Margaret Ortiz MRN: [id number] Date of Birth: 29-Jul-1960 Referring Provider: Ma Hillock, DO   Encounter Date: 01/16/2018  End of Session - 01/16/18 1444    Visit Number  19    Number of Visits  33    Date for SLP Re-Evaluation  02/20/18    SLP Start Time  0850    SLP Stop Time   0930    SLP Time Calculation (min)  40 min    Activity Tolerance  Patient tolerated treatment well       Past Medical History:  Diagnosis Date  . 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  . Cardiac LV ejection fraction 21-30%   . Cardioembolic stroke ([place]) 62/56/3893   Right hemiparesis  . Cardiomyopathy ([place]) 04/2017  . Dysphagia 05/19/2017   Last known diet upgraded to regular diet with thin liquids on 06/27/2017, no records available  . Graves disease   . Hyperlipidemia   . Hypertension   . Neuropathy 05/19/2017   s/p stroke  . Plaque psoriasis     Past Surgical History:  Procedure Laterality Date  . NO PAST SURGERIES      There were no vitals filed for this visit.  Subjective Assessment - 01/16/18 1440    Subjective  Pt arrives with Lingraphica (full size tablet) SLP took this up and presented pt with Mini Talk (mini tablet). "Oh, I --- I like," pt stated.    Patient is accompained by:  Family member    Currently in Pain?  No/denies            ADULT SLP TREATMENT - 01/16/18 1442      General Information   Behavior/Cognition  Alert;Cooperative;Pleasant mood      Treatment Provided   Treatment provided  Cognitive-Linquistic      Cognitive-Linquistic Treatment   Treatment focused on  Aphasia    Skilled Treatment  SLP assisted pt in filling out paperwork more fully, using  Mini Talk Lingraphica. Pt searched for icons with rare mod A, and SLP wrote down her responses to favorite foods, favorite drinks, food dislikes, and favorite stores and SLP transposed onto [place] information sheet. SLP confirmed pt prefers this device as opposed to a larger, more difficult to carry device, given her hemiplegic arm.       Assessment / Recommendations / Plan   Plan  Continue with current plan of care      Progression Toward Goals   Progression toward goals  Progressing toward goals         SLP Short Term Goals - 01/01/18 1721      SLP SHORT TERM GOAL #1   Title  n/a      SLP SHORT TERM GOAL #2   Title  n/a      SLP SHORT TERM GOAL #3   Title  n/a      SLP SHORT TERM GOAL #4   Title  n/a      SLP SHORT TERM GOAL #5   Title  n/a      SLP SHORT TERM GOAL #6   Title  Pt will reduce safety risks by communicating personal needs, requests for help, directives, using AAC if necessary, with occasional min A over 3 sessions.  Time  3    Period  Weeks    Status  On-going      SLP SHORT TERM GOAL #7   Title  Pt will increase communication effectiveness by answering questions in 90% of opportunities, using AAC if needed, over 3 sessions independently.    Time  3    Period  Weeks    Status  On-going      SLP SHORT TERM GOAL #8   Title  Pt will use multimodal communication/AAC when necessary in 5 minutes simple conversation with occasional min A over 3 sessions    Time  3    Period  Weeks    Status  On-going       SLP Long Term Goals - 01/16/18 1445      SLP LONG TERM GOAL #1   Title  Pt will participate functionally in 15 minutes simple-mod complex conversation with AAC for aphasia over 3 sessions.    Time  6    Period  Weeks    Status  On-going      SLP LONG TERM GOAL #2   Title  Pt will demo understanding of 10 minutes simple-mod complex conversation with compensations over 3 sessions    Time  6 renewed 12/22/17    Status  On-going      SLP LONG  TERM GOAL #3   Title  Pt will demo error awareness by attempting correction or by nonverbal response to errors with 90% success    Time  6 renewed 12/22/17    Period  Weeks    Status  On-going      SLP LONG TERM GOAL #4   Title  Pt will use multimodal communication/AAC when necessary in 10 minutes simple-mod complex conversation with rare min A over 6 sessions    Time  6    Period  Weeks    Status  On-going      SLP LONG TERM GOAL #5   Title  pt will demo alternating attention between two simple cognitive-linguistic tasks for 85% accuracy with self correction/double checking answers (aphasia compensations allowed) over 3 sessions    Time  6    Period  Weeks    Status  On-going       Plan - 01/16/18 1445    Clinical Impression Statement  Pt continues to present with moderate deficient fluent aphasia affecting comprehension and expression. See "skilled intervention" for details. Paraphasias and neologisms were readily heard in pt's speech. Continue skilled ST to maximize functional communication for independence and QOL.     Speech Therapy Frequency  2x / week    Duration  -- 8 weeks    Treatment/Interventions  Multimodal communcation approach;Compensatory strategies;Language facilitation;Compensatory techniques;Cueing hierarchy;Internal/external aids;Functional tasks;SLP instruction and feedback;Patient/family education    Potential to Achieve Goals  Good       Patient will benefit from skilled therapeutic intervention in order to improve the following deficits and impairments:   Aphasia  Cognitive communication deficit    Problem List Patient Active Problem List   Diagnosis Date Noted  . Fall 09/17/2017  . Hemiparesis of right dominant side as late effect of cerebral infarction ([place]) 09/17/2017  . Shoulder subluxation, right, initial encounter 09/17/2017  . Cardioembolic stroke ([place]) 78/67/5449  . Weakness 09/17/2017  . Aphasia 09/17/2017  . Dysphagia as late effect of  cerebral aneurysm 09/17/2017  . Cardiomyopathy ([place]) 09/17/2017  . H/O ischemic left MCA stroke 09/17/2017  . Cardiac LV ejection fraction 21-30%   .  Graves disease   . Plaque psoriasis   . Neuropathy 05/19/2017    [facility] ,[place], CCC-SLP  01/16/2018, 2:46 PM  [facility] [address] [place], [place], [address] Phone: [phone]   Fax:  [phone]   Name: Lanae Federer MRN: [id number] Date of Birth: May 18, 1961

## 2018-01-19 ENCOUNTER — Ambulatory Visit: Payer: BLUE CROSS/BLUE SHIELD | Admitting: Speech Pathology

## 2018-01-19 ENCOUNTER — Ambulatory Visit: Payer: BLUE CROSS/BLUE SHIELD | Admitting: Rehabilitative and Restorative Service Providers"

## 2018-01-19 ENCOUNTER — Ambulatory Visit: Payer: BLUE CROSS/BLUE SHIELD | Admitting: Occupational Therapy

## 2018-01-19 ENCOUNTER — Encounter: Payer: Self-pay | Admitting: Rehabilitative and Restorative Service Providers"

## 2018-01-19 DIAGNOSIS — R4701 Aphasia: Secondary | ICD-10-CM

## 2018-01-19 DIAGNOSIS — M6281 Muscle weakness (generalized): Secondary | ICD-10-CM

## 2018-01-19 DIAGNOSIS — R2681 Unsteadiness on feet: Secondary | ICD-10-CM

## 2018-01-19 DIAGNOSIS — R2689 Other abnormalities of gait and mobility: Secondary | ICD-10-CM

## 2018-01-19 DIAGNOSIS — R29818 Other symptoms and signs involving the nervous system: Secondary | ICD-10-CM

## 2018-01-19 DIAGNOSIS — I69351 Hemiplegia and hemiparesis following cerebral infarction affecting right dominant side: Secondary | ICD-10-CM

## 2018-01-19 NOTE — Therapy (Signed)
[facility] [address] [place], [place], [address] Phone: [phone]   Fax:  [phone]  Speech Language Pathology Treatment  Patient Details  Name: Margaret Ortiz MRN: [id number] Date of Birth: 01/15/1961 Referring Provider: Ma Hillock, DO   Encounter Date: 01/19/2018  End of Session - 01/19/18 1737    Visit Number  20    Number of Visits  19    Authorization Type  BCBS    SLP Start Time  0848    SLP Stop Time   3149    SLP Time Calculation (min)  46 min    Activity Tolerance  Patient tolerated treatment well       Past Medical History:  Diagnosis Date  . 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  . Cardiac LV ejection fraction 21-30%   . Cardioembolic stroke ([place]) 70/26/3785   Right hemiparesis  . Cardiomyopathy ([place]) 04/2017  . Dysphagia 05/19/2017   Last known diet upgraded to regular diet with thin liquids on 06/27/2017, no records available  . Graves disease   . Hyperlipidemia   . Hypertension   . Neuropathy 05/19/2017   s/p stroke  . Plaque psoriasis     Past Surgical History:  Procedure Laterality Date  . NO PAST SURGERIES      There were no vitals filed for this visit.  Subjective Assessment - 01/19/18 1718    Subjective  "I like it."    Currently in Pain?  No/denies            ADULT SLP TREATMENT - 01/19/18 0847      General Information   Behavior/Cognition  Alert;Cooperative;Pleasant mood      Treatment Provided   Treatment provided  Cognitive-Linquistic      Pain Assessment   Pain Assessment  No/denies pain      Cognitive-Linquistic Treatment   Treatment focused on  Aphasia    Skilled Treatment  Patient arrived with MiniTalk Lingraphica device; SLP transferred pt's icons from [place] to [place]. Pt navigated to "emergency" section and used address icon, asking "where to put?"  SLP demo'd icon creation and pt created an icon adding her home address with occasional mod A. Pt asking what to put in "things I need help with." SLP created icon personalized for one of pt's frequent requests of this SLP (to open the bathroom door for her). Pt able to create icons, add and edit text and use the internet image search function with occasional min-mod A. Encouraged pt to take photos at home and add additional icons to "family" and "work" sections.      Assessment / Recommendations / Plan   Plan  Continue with current plan of care      Progression Toward Goals   Progression toward goals  Progressing toward goals       SLP Education - 01/19/18 1736    Education provided  Yes    Education Details  Requested Richardson Landry join in future sessions to assist with device personalization and to improve carryover/use of device at home    Person(s) Educated  Patient Brother in Office manager (after session)   Brother in Office manager (after session)   Methods  Explanation    Comprehension  Verbalized understanding       SLP Short Term Goals - 01/19/18 1716      SLP SHORT TERM GOAL #6   Title  Pt  will reduce safety risks by communicating personal needs, requests for help, directives, using AAC if necessary, with occasional min A over 3 sessions.    Time  2    Period  Weeks    Status  On-going      SLP SHORT TERM GOAL #7   Title  Pt will increase communication effectiveness by answering questions in 90% of opportunities, using AAC if needed, over 3 sessions independently.    Time  2    Period  Weeks    Status  On-going      SLP SHORT TERM GOAL #8   Title  Pt will use multimodal communication/AAC when necessary in 5 minutes simple conversation with occasional min A over 3 sessions    Time  2    Period  Weeks    Status  On-going       SLP Long Term Goals - 01/19/18 1717      SLP LONG TERM GOAL #1   Title  Pt will participate functionally in 15 minutes simple-mod complex conversation with  AAC for aphasia over 3 sessions.    Time  5    Period  Weeks    Status  On-going      SLP LONG TERM GOAL #2   Title  Pt will demo understanding of 10 minutes simple-mod complex conversation with compensations over 3 sessions    Time  5    Period  Weeks    Status  On-going      SLP LONG TERM GOAL #3   Title  Pt will demo error awareness by attempting correction or by nonverbal response to errors with 90% success    Time  5    Period  Weeks    Status  On-going      SLP LONG TERM GOAL #4   Title  Pt will use multimodal communication/AAC when necessary in 10 minutes simple-mod complex conversation with rare min A over 6 sessions    Time  5    Period  Weeks    Status  On-going      SLP LONG TERM GOAL #5   Title  pt will demo alternating attention between two simple cognitive-linguistic tasks for 85% accuracy with self correction/double checking answers (aphasia compensations allowed) over 3 sessions    Time  5    Period  Weeks    Status  On-going       Plan - 01/19/18 1737    Clinical Impression Statement  Pt continues to present with 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 See "skilled intervention" for details. Paraphasias and neologisms were readily heard in pt's speech and seen in pt's typing on Lingraphica device. Continue skilled ST to maximize functional communication for independence and QOL.     Speech Therapy Frequency  2x / week    Treatment/Interventions  Multimodal communcation approach;Compensatory strategies;Language facilitation;Compensatory techniques;Cueing hierarchy;Internal/external aids;Functional tasks;SLP instruction and feedback;Patient/family education    Potential to Achieve Goals  Good    Potential Considerations  Severity of impairments    Consulted and Agree with Plan of Care  Patient       Patient will benefit from skilled therapeutic intervention in order to improve the following deficits and impairments:    Aphasia    Problem List Patient Active Problem List   Diagnosis Date Noted  . Fall 09/17/2017  . Hemiparesis of right dominant side as late effect of cerebral infarction ([place]) 09/17/2017  . Shoulder subluxation, right, initial encounter 09/17/2017  .  Cardioembolic stroke ([place]) 00/37/0488  . Weakness 09/17/2017  . Aphasia 09/17/2017  . 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 Cardiomyopathy ([place]) 09/17/2017  . H/O ischemic left MCA stroke 09/17/2017  . Cardiac LV ejection fraction 21-30%   . Graves disease   . Plaque psoriasis   . Neuropathy 05/19/2017   Deneise Lever, [place], [place] 01/19/2018, 5:40 PM  [facility] [address] [place], [place], [address] Phone: [phone]   Fax:  [phone]   Name: Anaid Haney MRN: [id number] Date of Birth: April 08, 1961

## 2018-01-19 NOTE — Therapy (Signed)
[facility] [address] [place], [place], [address] Phone: [phone]   Fax:  [phone]  Physical Therapy Treatment  Patient Details  Name: Margaret Ortiz MRN: [id number] Date of Birth: 31-Mar-1961 Referring Provider: Howard Pouch, DO   Encounter Date: 01/19/2018  PT End of Session - 01/19/18 1226    Visit Number  21    Number of Visits  33    Date for PT Re-Evaluation  02/27/18    Authorization Type  BCBS     PT Start Time  1017    PT Stop Time  1100    PT Time Calculation (min)  43 min    Equipment Utilized During Treatment  -- min guard to S    Activity Tolerance  Patient tolerated treatment well    Behavior During Therapy  [facility] for tasks assessed/performed       Past Medical History:  Diagnosis Date  . 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  . Cardiac LV ejection fraction 21-30%   . Cardioembolic stroke ([facility]) 81/82/9937   Right hemiparesis  . Cardiomyopathy ([place]) 04/2017  . Dysphagia 05/19/2017   Last known diet upgraded to regular diet with thin liquids on 06/27/2017, no records available  . Graves disease   . Hyperlipidemia   . Hypertension   . Neuropathy 05/19/2017   s/p stroke  . Plaque psoriasis     Past Surgical History:  Procedure Laterality Date  . NO PAST SURGERIES      There were no vitals filed for this visit.  Subjective Assessment - 01/19/18 1018    Subjective  Spoke with patient's brother in law re: social work questions.  "There's not a lot she can get because of her situation."  He notes they are looking for assistance re: transportation.  Brother in law asks questions about railing on one side.   He is wanting to know techniques for negotiating situations iwth one rail.    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                          [facility] Adult PT Treatment/Exercise - 01/19/18 1226      Ambulation/Gait   Ambulation/Gait  Yes    Ambulation/Gait Assistance  4: Min assist;4: Min guard;5: Supervision    Ambulation/Gait Assistance Details  Session performed with new AFO donned (bag provided to patient for brace)-- removed right shoe button and laced tightly up to highest position at ankle for improved support.  Patient's brother in law reports that they are going to return a 6.5 to get a 7.0 new shoe for patient that holds the brace in place.      Ambulation Distance (Feet)  600 Feet 200., 300, 300    Assistive device  Small based quad cane;None    Gait Pattern  Decreased stance time - right;Decreased step length - left;Decreased stride length;Decreased hip/knee flexion - right;Decreased dorsiflexion - right;Decreased weight shift to right;Right circumduction;Right hip hike;Lateral hip instability;Poor foot clearance - right    Ambulation Surface  Level;Indoor    Stairs  Yes    Stairs Assistance  6: Modified independent (Device/Increase time)    Pre-Gait Activities  On stairs, demonstrated to patient and brother in law how to negotiate with one handrail.  Recommended step to pattern ascending  with left rail (as patient has on home apartment), and sidestepping down steps facing rail on left (left leg leads).  Demonstrated that with both rails or the availability of rail on the left side to descend, she would lead with weaker leg.  Practiced 8 steps x 2 reps.    Gait Comments  PT provided verbal and demo cues for longer step length on the left side.  PT provided R scapular and inferior shoulder cues for gentle weight shifting to the right.  Demonstrated knee flexion to patient and facilitated knee flexion at initial swing phase to decrease R hip hike/Rcircumduction.      Neuro Re-ed    Neuro Re-ed Details   Right LE weight shifting performed in standing moving L foot up/down to 3" block initially  without L UE support, however offered chair back with intermittent support to decrease hip rotation.  PT provided min to mod A and facilitation for right weight shifting and lengthening R trunk musculature.  Worked on awareness of hip in space and dec'ing shoulder rotation to the right (that accompanied R hip retraction).  Worked on carryover to gait activities after NMR.               PT Short Term Goals - 12/29/17 2015      PT SHORT TERM GOAL #1   Title  The patient will ambulate on level community surfaces with SPC mod indep for improved mobility.    Baseline  Currently uses SBQC in community/ SPC in home    Time  4    Period  Weeks    Status  New    Target Date  01/28/18      PT SHORT TERM GOAL #2   Title  The patient will improve gait speed from 0.85 ft/sec to > or equal to 1.2 ft/sec to demonstrate transition from "household ambulator" to "limited community ambulator" classification of gait.    Baseline  0.96 ft/sec at updated goals    Time  4    Period  Weeks    Status  Revised    Target Date  01/28/18      PT SHORT TERM GOAL #3   Title  The patient will improve Berg balance score from 42/56 to > or equal to 45/56 to demo dec'ing risk for falls.    Time  4    Period  Weeks    Status  Revised    Target Date  01/28/18      PT SHORT TERM GOAL #4   Title  The patient will ambulate x 600 ft nonstop to demo improved endurance for functional gait.    Baseline  (424 ft nonstop is max documented)    Time  4    Period  Weeks    Status  Revised    Target Date  01/28/18        PT Long Term Goals - 12/29/17 2012      PT LONG TERM GOAL #1   Title  The patient will perform updated HEP with intermittent assist for post d/c progression of exercise.    Time  8    Period  Weeks    Status  New    Target Date  02/27/18      PT LONG TERM GOAL #2   Title  The patient will move floor<>stand with mod indep using L UE support on surface due to h/o falls.    Time  8    Period  Weeks    Status  Revised    Target Date  02/27/18      PT LONG TERM GOAL #3   Title  The patient will improve gait speed from 0.85 ft/sec to > or equal to 1.5 ft/sec to demo dec'ing risk for falls.    Baseline  0.98 ft.sec on 12/22/2017    Time  8    Period  Weeks    Status  Revised    Target Date  02/27/18      PT LONG TERM GOAL #4   Title  The patient will improve Berg score from 42/56 (on 12/22/2017) up to 48/56 to demo dec'ing risk for falls.    Time  8    Period  Weeks    Status  Revised    Target Date  02/27/18      PT LONG TERM GOAL #5   Title  The patient will negotiate 12 steps without a handrail with step to pattern mod indep to access home entries.    Baseline  one handrail mod indep with step to pattern 12/22/2017    Time  8    Period  Weeks    Status  Revised    Target Date  02/27/18            Plan - 01/19/18 1446    Clinical Impression Statement  PT continued training with Spry Step PLS AFO on R LE without device with tactile cues/facilitation to encourage reciprocal pattern.  With repeated cues, the patient was able to flex R knee at initial swing and keep hip anteriorly leading during R stance phase.  PT was able to decrease facilitation/manual cues after much repetition with gait.  Will assess to see if carryover present at next session.  ALSO, REVIEWED WHY she should be using her SPC at home.  Sheis continuing to rely more on [facility], which explains the slower than expected return to more reciprocal, step through walking pattern.     PT Treatment/Interventions  ADLs/Self Care Home Management;Gait training;Stair training;Functional mobility training;Therapeutic activities;Therapeutic exercise;Balance training;Neuromuscular re-education;Patient/family education;Orthotic Fit/Training;Electrical Stimulation;Manual techniques;DME Instruction    PT Next Visit Plan  Check new shoe, Also Farrah (Em and Susanne's pt) would like to exchange contact info with jaheda, but I wanted  to make sure family was here to discuss with them before doing.  Continue with B[place]ess (tablet 1) for both lower and upper unit RLE, R side strengthening, hip initiation during gait tasks, right LE loading/weight shifting.  gait training (in clinic without SBQC to encourage more reciprocal pattern),     Consulted and Agree with Plan of Care  Patient       Patient will benefit from skilled therapeutic intervention in order to improve the following deficits and impairments:  Abnormal gait, Impaired sensation, Pain, Postural dysfunction, Impaired tone, Decreased mobility, Decreased coordination, Decreased activity tolerance, Decreased endurance, Decreased strength, Difficulty walking, Decreased balance  Visit Diagnosis: Other symptoms and signs involving the nervous system  Unsteadiness on feet  Muscle weakness (generalized)  Other abnormalities of gait and mobility     Problem List Patient Active Problem List   Diagnosis Date Noted  . Fall 09/17/2017  . Hemiparesis of right dominant side as late effect of cerebral infarction ([place]) 09/17/2017  . Shoulder subluxation, right, initial encounter 09/17/2017  . Cardioembolic stroke ([facility]) 12/22/1599  . Weakness 09/17/2017  . Aphasia 09/17/2017  . 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 Cardiomyopathy ([facility]) 09/17/2017  . H/O  ischemic left MCA stroke 09/17/2017  . Cardiac LV ejection fraction 21-30%   . Graves disease   . Plaque psoriasis   . Neuropathy 05/19/2017    Modest Draeger, PT 01/19/2018, 2:49 PM  [facility] [address] [place], [place], [address] Phone: [phone]   Fax:  [phone]  Name: Margaret Ortiz MRN: [id number] Date of Birth: 08/23/1960

## 2018-01-19 NOTE — Therapy (Signed)
[facility] [address] [place], [place], [address] Phone: [phone]   Fax:  [phone]  Occupational Therapy Treatment  Patient Details  Name: Margaret Ortiz MRN: [id number] Date of Birth: 01-Feb-1961 Referring Provider: Howard Pouch, DO   Encounter Date: 01/19/2018  OT End of Session - 01/19/18 1227    Visit Number  26    Number of Visits  36    Date for OT Re-Evaluation  02/20/18    Authorization Type  BC/BS    OT Start Time  0935    OT Stop Time  1015    OT Time Calculation (min)  40 min    Activity Tolerance  Patient tolerated treatment well    Behavior During Therapy  [facility] for tasks assessed/performed       Past Medical History:  Diagnosis Date  . 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  . Cardiac LV ejection fraction 21-30%   . Cardioembolic stroke ([place]) 31/54/0086   Right hemiparesis  . Cardiomyopathy ([place]) 04/2017  . Dysphagia 05/19/2017   Last known diet upgraded to regular diet with thin liquids on 06/27/2017, no records available  . Graves disease   . Hyperlipidemia   . Hypertension   . Neuropathy 05/19/2017   s/p stroke  . Plaque psoriasis     Past Surgical History:  Procedure Laterality Date  . NO PAST SURGERIES      There were no vitals filed for this visit.  Subjective Assessment - 01/19/18 0939    Patient is accompained by:  Family member Brother-n-law    Pertinent History  Lt MCA CVA 05/19/2017, Graves dz    Patient Stated Goals  use my Rt arm    Currently in Pain?  No/denies                   OT Treatments/Exercises (OP) - 01/19/18 0001      ADLs   ADL Comments  Discussion re: IADL status - brother confirms that she is doing own laundry, and pt is consistently getting snacks herself and for her mother on occasion.       Neurological Re-education Exercises   Other Exercises 1   Standing at table: AA/ROM BUE's in low range sh. flex with mod facilitation RUE (using ball)    Other Weight-Bearing Exercises 1  Seated: Wt bearing over Rt elbow and pushing up w/ wt shifts and lateral trunk flexion. Wt bearing over extended RUE w/ trunk rotation bilaterally. Followed by sit-stand engaging Rt side. Standing: wt bearing over BUE's w/ A/P wt shifts at table, followed by wt bearing over RUE while disengaging LUE in functional reaching               OT Short Term Goals - 01/05/18 1137      OT SHORT TERM GOAL #6   Title  Pt to demo 25 degrees shoulder flexion in prep for low level reaching with gross finger flexion - goals due 01/21/2018    Time  4    Period  Weeks    Status  On-going      OT SHORT TERM GOAL #7   Title  Pt to perform environmental scanning at 90% accuracy during ambulation    Time  4    Period  Weeks    Status  On-going        OT Long Term Goals - 01/05/18 1137  OT LONG TERM GOAL #1   Title  Pt to perform shower transfers at mod I level w/ DME prn and no LOB - goals due 02/18/2018    Time  8    Period  Weeks    Status  On-going Pt requires supervision to min assist at this time secondary to not having grab bars, other A/E in shower yet      OT LONG TERM GOAL #2   Title  Pt to perform simple meal prep at sup level using A/E and task modifications prn    Time  8    Period  Weeks    Status  On-going Pt now agrees to work on this again      OT [place] #3   Title  Pt to perform light house management tasks at sup level including: laundry, washing dishes, cleaning, making bed    Time  8    Period  Weeks    Status  On-going Pt reports she is already washing dishes      OT LONG TERM GOAL #4   Title  Pt to demo 25% finger extension in prep for releasing objects Rt hand     Time  8    Period  Weeks    Status  On-going      OT LONG TERM GOAL #5   Title  (LTG #5 moved to STG for renewal period)      OT LONG TERM GOAL #6   Title   Pt to use Rt hand as stabalizer 25% of the time    Time  8    Period  Weeks    Status  On-going      OT LONG TERM GOAL #7   Title  Pt/family to verbalize understanding with neccessary accommodations, A/E, etc that would be needed if pt is to return to living alone    Time  8    Period  Weeks    Status  On-going            Plan - 01/19/18 1228    Clinical Impression Statement  Pt gradually progressing with shoulder activation during closed chain activities. No functional open chain movement present RUE    Occupational Profile and client history currently impacting functional performance  no significant PMH but current impairments extensive    Occupational performance deficits (Please refer to evaluation for details):  ADL's;IADL's;Work;Leisure;Social Participation    Rehab Potential  Fair    Current Impairments/barriers affecting progress:  severity of deficits    OT Frequency  2x / week    OT Duration  8 weeks    OT Treatment/Interventions  Self-care/ADL training;Moist Heat;DME and/or AE instruction;Splinting;Therapeutic activities;Psychosocial skills training;Aquatic Therapy;Cognitive remediation/compensation;Therapeutic exercise;Coping strategies training;Neuromuscular education;Functional Mobility Training;Passive range of motion;Visual/perceptual remediation/compensation;Electrical Stimulation;Manual Therapy;Patient/family education    Plan  continue NMR for RUE, trunk, balance ,transitional movements functional mobility, estim    Consulted and Agree with Plan of Care  Patient;Family member/caregiver    Family Member Consulted  brother-n-law        Patient will benefit from skilled therapeutic intervention in order to improve the following deficits and impairments:  Decreased coordination, Decreased range of motion, Difficulty walking, Improper body mechanics, Decreased endurance, Decreased safety awareness, Impaired sensation, Improper spinal/pelvic alignment, Impaired tone,  Decreased knowledge of precautions, Decreased knowledge of use of DME, Decreased balance, Impaired UE functional use, Pain, Decreased cognition, Decreased mobility, Decreased strength, Impaired perceived functional ability, Impaired vision/preception  Visit Diagnosis: Hemiplegia and  hemiparesis following cerebral infarction affecting right dominant side (HCC)  Unsteadiness on feet  Muscle weakness (generalized)    Problem List Patient Active Problem List   Diagnosis Date Noted  . Fall 09/17/2017  . Hemiparesis of right dominant side as late effect of cerebral infarction ([facility]) 09/17/2017  . Shoulder subluxation, right, initial encounter 09/17/2017  . Cardioembolic stroke ([facility]) 96/09/5407  . Weakness 09/17/2017  . Aphasia 09/17/2017  . 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 Cardiomyopathy ([place]) 09/17/2017  . H/O ischemic left MCA stroke 09/17/2017  . Cardiac LV ejection fraction 21-30%   . Graves disease   . Plaque psoriasis   . Neuropathy 05/19/2017    Carey Bullocks, OTR/L 01/19/2018, 12:30 PM  [facility] [address] [place], [place], [address] Phone: [phone]   Fax:  [phone]  Name: Margaret Ortiz MRN: [id number] Date of Birth: 1961-01-04

## 2018-01-22 ENCOUNTER — Ambulatory Visit: Payer: BLUE CROSS/BLUE SHIELD | Admitting: Rehabilitative and Restorative Service Providers"

## 2018-01-22 ENCOUNTER — Ambulatory Visit: Payer: BLUE CROSS/BLUE SHIELD | Attending: Family Medicine | Admitting: Occupational Therapy

## 2018-01-22 DIAGNOSIS — I69318 Other symptoms and signs involving cognitive functions following cerebral infarction: Secondary | ICD-10-CM | POA: Insufficient documentation

## 2018-01-22 DIAGNOSIS — M25511 Pain in right shoulder: Secondary | ICD-10-CM | POA: Diagnosis present

## 2018-01-22 DIAGNOSIS — M6281 Muscle weakness (generalized): Secondary | ICD-10-CM | POA: Diagnosis present

## 2018-01-22 DIAGNOSIS — R41841 Cognitive communication deficit: Secondary | ICD-10-CM | POA: Insufficient documentation

## 2018-01-22 DIAGNOSIS — R471 Dysarthria and anarthria: Secondary | ICD-10-CM | POA: Insufficient documentation

## 2018-01-22 DIAGNOSIS — R278 Other lack of coordination: Secondary | ICD-10-CM | POA: Diagnosis present

## 2018-01-22 DIAGNOSIS — I69351 Hemiplegia and hemiparesis following cerebral infarction affecting right dominant side: Secondary | ICD-10-CM | POA: Diagnosis present

## 2018-01-22 DIAGNOSIS — G8929 Other chronic pain: Secondary | ICD-10-CM | POA: Insufficient documentation

## 2018-01-22 DIAGNOSIS — R4701 Aphasia: Secondary | ICD-10-CM | POA: Diagnosis present

## 2018-01-22 DIAGNOSIS — R2689 Other abnormalities of gait and mobility: Secondary | ICD-10-CM | POA: Insufficient documentation

## 2018-01-22 DIAGNOSIS — R208 Other disturbances of skin sensation: Secondary | ICD-10-CM | POA: Insufficient documentation

## 2018-01-22 DIAGNOSIS — R2681 Unsteadiness on feet: Secondary | ICD-10-CM | POA: Insufficient documentation

## 2018-01-22 DIAGNOSIS — R29818 Other symptoms and signs involving the nervous system: Secondary | ICD-10-CM | POA: Insufficient documentation

## 2018-01-22 DIAGNOSIS — R41842 Visuospatial deficit: Secondary | ICD-10-CM | POA: Diagnosis present

## 2018-01-22 NOTE — Therapy (Signed)
[facility] [address] [place], [place], [address] Phone: [phone]   Fax:  [phone]  Occupational Therapy Treatment  Patient Details  Name: Margaret Ortiz MRN: [id number] Date of Birth: [birth year] Referring Provider: Howard Pouch, DO   Encounter Date: 01/22/2018  OT End of Session - 01/22/18 1212    Visit Number  27    Number of Visits  36    Date for OT Re-Evaluation  02/20/18    Authorization Type  BC/BS    OT Start Time  0930    OT Stop Time  1015    OT Time Calculation (min)  45 min    Activity Tolerance  Patient tolerated treatment well    Behavior During Therapy  [facility] for tasks assessed/performed       Past Medical History:  Diagnosis Date  . 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  . Cardiac LV ejection fraction 21-30%   . Cardioembolic stroke ([facility]) 56/38/7564   Right hemiparesis  . Cardiomyopathy ([place]) 04/2017  . Dysphagia 05/19/2017   Last known diet upgraded to regular diet with thin liquids on 06/27/2017, no records available  . Graves disease   . Hyperlipidemia   . Hypertension   . Neuropathy 05/19/2017   s/p stroke  . Plaque psoriasis     Past Surgical History:  Procedure Laterality Date  . NO PAST SURGERIES      There were no vitals filed for this visit.  Subjective Assessment - 01/22/18 1159    Subjective   Pt w/ difficulty expressing, but had further questions about continuing past POC - Therapist has already been prepping pt/family for possible d/c end of Aug     Pertinent History  Lt MCA CVA 05/19/2017, Graves dz    Patient Stated Goals  use my Rt arm    Currently in Pain?  No/denies                   OT Treatments/Exercises (OP) - 01/22/18 0001      Neurological Re-education Exercises   Other Exercises 1  Standing at table: worked extensively on wt shifts and activating  entire Rt side with full arm wt bearing, over elbows, and progressing to disengaging LUE while wt bearing over Rt arm.     Other Exercises 2  Seated: BUE AA/ROM w/ PVC frame - sliding along table with assist to keep Rt hand on frame, and mod facilitation RUE. Pt then held pool noodle both hands and slid along forearm and able to lift against gravity w/ some assist RUE.       Acupuncturist Location  dorsal forearm    Electrical Stimulation Action  wrist and finger extension    Electrical Stimulation Parameters  50 pps, 250 pw, 10 sec. on/off cycle, x 10 min.     Electrical Stimulation Goals  Neuromuscular facilitation               OT Short Term Goals - 01/05/18 1137      OT SHORT TERM GOAL #6   Title  Pt to demo 25 degrees shoulder flexion in prep for low level reaching with gross finger flexion - goals due 01/21/2018    Time  4    Period  Weeks    Status  On-going      OT SHORT TERM GOAL #7   Title  Pt  to perform environmental scanning at 90% accuracy during ambulation    Time  4    Period  Weeks    Status  On-going        OT Long Term Goals - 01/05/18 1137      OT LONG TERM GOAL #1   Title  Pt to perform shower transfers at mod I level w/ DME prn and no LOB - goals due 02/18/2018    Time  8    Period  Weeks    Status  On-going Pt requires supervision to min assist at this time secondary to not having grab bars, other A/E in shower yet      OT LONG TERM GOAL #2   Title  Pt to perform simple meal prep at sup level using A/E and task modifications prn    Time  8    Period  Weeks    Status  On-going Pt now agrees to work on this again      OT [place] #3   Title  Pt to perform light house management tasks at sup level including: laundry, washing dishes, cleaning, making bed    Time  8    Period  Weeks    Status  On-going Pt reports she is already washing dishes      OT LONG TERM GOAL #4   Title  Pt to demo 25% finger  extension in prep for releasing objects Rt hand     Time  8    Period  Weeks    Status  On-going      OT LONG TERM GOAL #5   Title  (LTG #5 moved to STG for renewal period)      OT LONG TERM GOAL #6   Title  Pt to use Rt hand as stabalizer 25% of the time    Time  8    Period  Weeks    Status  On-going      OT LONG TERM GOAL #7   Title  Pt/family to verbalize understanding with neccessary accommodations, A/E, etc that would be needed if pt is to return to living alone    Time  8    Period  Weeks    Status  On-going            Plan - 01/22/18 1212    Clinical Impression Statement  Pt gradually progressing with shoulder activation during closed chain activities. No functional open chain movement present RUE, however did demo improved AA/ROM     Occupational Profile and client history currently impacting functional performance  no significant PMH but current impairments extensive    Occupational performance deficits (Please refer to evaluation for details):  ADL's;IADL's;Work;Leisure;Social Participation    Rehab Potential  Fair    Current Impairments/barriers affecting progress:  severity of deficits    OT Frequency  2x / week    OT Duration  8 weeks    OT Treatment/Interventions  Self-care/ADL training;Moist Heat;DME and/or AE instruction;Splinting;Therapeutic activities;Psychosocial skills training;Aquatic Therapy;Cognitive remediation/compensation;Therapeutic exercise;Coping strategies training;Neuromuscular education;Functional Mobility Training;Passive range of motion;Visual/perceptual remediation/compensation;Electrical Stimulation;Manual Therapy;Patient/family education    Plan  continue NMR RUE, trunk, estim    Consulted and Agree with Plan of Care  Patient       Patient will benefit from skilled therapeutic intervention in order to improve the following deficits and impairments:  Decreased coordination, Decreased range of motion, Difficulty walking, Improper body  mechanics, Decreased endurance, Decreased safety awareness, Impaired sensation, Improper spinal/pelvic alignment,  Impaired tone, Decreased knowledge of precautions, Decreased knowledge of use of DME, Decreased balance, Impaired UE functional use, Pain, Decreased cognition, Decreased mobility, Decreased strength, Impaired perceived functional ability, Impaired vision/preception  Visit Diagnosis: Hemiplegia and hemiparesis following cerebral infarction affecting right dominant side (HCC)  Unsteadiness on feet  Muscle weakness (generalized)  Other symptoms and signs involving the nervous system    Problem List Patient Active Problem List   Diagnosis Date Noted  . Fall 09/17/2017  . Hemiparesis of right dominant side as late effect of cerebral infarction ([place]) 09/17/2017  . Shoulder subluxation, right, initial encounter 09/17/2017  . Cardioembolic stroke ([facility]) 70/48/8891  . Weakness 09/17/2017  . Aphasia 09/17/2017  . 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 Cardiomyopathy ([facility]) 09/17/2017  . H/O ischemic left MCA stroke 09/17/2017  . Cardiac LV ejection fraction 21-30%   . Graves disease   . Plaque psoriasis   . Neuropathy 05/19/2017    Carey Bullocks, OTR/L 01/22/2018, 12:14 PM  [facility] [address] [place], [place], [address] Phone: [phone]   Fax:  [phone]  Name: Reagan Behlke MRN: [id number] Date of Birth: Aug 29, 1960

## 2018-01-26 ENCOUNTER — Ambulatory Visit: Payer: BLUE CROSS/BLUE SHIELD | Admitting: Occupational Therapy

## 2018-01-26 DIAGNOSIS — R2681 Unsteadiness on feet: Secondary | ICD-10-CM

## 2018-01-26 DIAGNOSIS — R29818 Other symptoms and signs involving the nervous system: Secondary | ICD-10-CM

## 2018-01-26 DIAGNOSIS — I69351 Hemiplegia and hemiparesis following cerebral infarction affecting right dominant side: Secondary | ICD-10-CM | POA: Diagnosis not present

## 2018-01-26 NOTE — Therapy (Signed)
[facility] [address] [place], [place], [address] Phone: [phone]   Fax:  [phone]  Occupational Therapy Treatment  Patient Details  Name: Margaret Ortiz MRN: [id number] Date of Birth: 28-Jun-1960 Referring Provider: Howard Pouch, DO   Encounter Date: 01/26/2018  OT End of Session - 01/26/18 1214    Visit Number  28    Number of Visits  36    Date for OT Re-Evaluation  02/20/18    Authorization Type  BC/BS    OT Start Time  5631    OT Stop Time  1102    OT Time Calculation (min)  47 min    Activity Tolerance  Patient tolerated treatment well    Behavior During Therapy  [facility] for tasks assessed/performed       Past Medical History:  Diagnosis Date  . 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  . Cardiac LV ejection fraction 21-30%   . Cardioembolic stroke ([place]) 49/70/2637   Right hemiparesis  . Cardiomyopathy ([place]) 04/2017  . Dysphagia 05/19/2017   Last known diet upgraded to regular diet with thin liquids on 06/27/2017, no records available  . Graves disease   . Hyperlipidemia   . Hypertension   . Neuropathy 05/19/2017   s/p stroke  . Plaque psoriasis     Past Surgical History:  Procedure Laterality Date  . NO PAST SURGERIES      There were no vitals filed for this visit.  Subjective Assessment - 01/26/18 1021    Pertinent History  Lt MCA CVA 05/19/2017, Graves dz    Patient Stated Goals  use my Rt arm    Currently in Pain?  No/denies                   OT Treatments/Exercises (OP) - 01/26/18 0001      Neurological Re-education Exercises   Other Exercises 1  Continued standing at table: worked extensively on wt shifts and activating entire Rt side with full arm wt bearing, over bilateral elbows, and then progressing disengaging LUE.     Other Exercises 2  Seated: BUE AA/ROM low range sh. flex with  pool noodle.       Acupuncturist Location  dorsal forearm    Electrical Stimulation Action  wrist and finger ext    Electrical Stimulation Parameters  previous parameters x 10 min    Electrical Stimulation Goals  Neuromuscular facilitation               OT Short Term Goals - 01/05/18 1137      OT SHORT TERM GOAL #6   Title  Pt to demo 25 degrees shoulder flexion in prep for low level reaching with gross finger flexion - goals due 01/21/2018    Time  4    Period  Weeks    Status  On-going      OT SHORT TERM GOAL #7   Title  Pt to perform environmental scanning at 90% accuracy during ambulation    Time  4    Period  Weeks    Status  On-going        OT Long Term Goals - 01/05/18 1137      OT LONG TERM GOAL #1   Title  Pt to perform shower transfers at mod I level w/ DME prn and no LOB - goals due 02/18/2018  Time  8    Period  Weeks    Status  On-going Pt requires supervision to min assist at this time secondary to not having grab bars, other A/E in shower yet      OT LONG TERM GOAL #2   Title  Pt to perform simple meal prep at sup level using A/E and task modifications prn    Time  8    Period  Weeks    Status  On-going Pt now agrees to work on this again      [place] #3   Title  Pt to perform light house management tasks at sup level including: laundry, washing dishes, cleaning, making bed    Time  8    Period  Weeks    Status  On-going Pt reports she is already washing dishes      OT LONG TERM GOAL #4   Title  Pt to demo 25% finger extension in prep for releasing objects Rt hand     Time  8    Period  Weeks    Status  On-going      OT LONG TERM GOAL #5   Title  (LTG #5 moved to STG for renewal period)      OT LONG TERM GOAL #6   Title  Pt to use Rt hand as stabalizer 25% of the time    Time  8    Period  Weeks    Status  On-going      OT LONG TERM GOAL #7   Title  Pt/family to verbalize understanding  with neccessary accommodations, A/E, etc that would be needed if pt is to return to living alone    Time  8    Period  Weeks    Status  On-going            Plan - 01/26/18 1214    Clinical Impression Statement  Pt gradually progressing with shoulder activation during closed chain activities. No functional open chain movement present RUE.    Occupational Profile and client history currently impacting functional performance  no significant PMH but current impairments extensive    Occupational performance deficits (Please refer to evaluation for details):  ADL's;IADL's;Work;Leisure;Social Participation    Rehab Potential  Fair    Current Impairments/barriers affecting progress:  severity of deficits    OT Frequency  2x / week    OT Duration  8 weeks    OT Treatment/Interventions  Self-care/ADL training;Moist Heat;DME and/or AE instruction;Splinting;Therapeutic activities;Psychosocial skills training;Aquatic Therapy;Cognitive remediation/compensation;Therapeutic exercise;Coping strategies training;Neuromuscular education;Functional Mobility Training;Passive range of motion;Visual/perceptual remediation/compensation;Electrical Stimulation;Manual Therapy;Patient/family education    Plan  continue NMR RUE, trunk, and transitional movements, estim    Consulted and Agree with Plan of Care  Patient       Patient will benefit from skilled therapeutic intervention in order to improve the following deficits and impairments:  Decreased coordination, Decreased range of motion, Difficulty walking, Improper body mechanics, Decreased endurance, Decreased safety awareness, Impaired sensation, Improper spinal/pelvic alignment, Impaired tone, Decreased knowledge of precautions, Decreased knowledge of use of DME, Decreased balance, Impaired UE functional use, Pain, Decreased cognition, Decreased mobility, Decreased strength, Impaired perceived functional ability, Impaired vision/preception  Visit  Diagnosis: Hemiplegia and hemiparesis following cerebral infarction affecting right dominant side (HCC)  Unsteadiness on feet  Other symptoms and signs involving the nervous system    Problem List Patient Active Problem List   Diagnosis Date Noted  . Fall 09/17/2017  . Hemiparesis of  right dominant side as late effect of cerebral infarction ([place]) 09/17/2017  . Shoulder subluxation, right, initial encounter 09/17/2017  . Cardioembolic stroke ([place]) 96/29/5284  . Weakness 09/17/2017  . Aphasia 09/17/2017  . 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 Cardiomyopathy ([place]) 09/17/2017  . H/O ischemic left MCA stroke 09/17/2017  . Cardiac LV ejection fraction 21-30%   . Graves disease   . Plaque psoriasis   . Neuropathy 05/19/2017    Carey Bullocks, OTR/L 01/26/2018, 12:16 PM  [facility] [address] [place], [place], [address] Phone: [phone]   Fax:  [phone]  Name: Margaret Ortiz MRN: [id number] Date of Birth: 11-03-1960

## 2018-01-28 ENCOUNTER — Ambulatory Visit: Payer: BLUE CROSS/BLUE SHIELD

## 2018-01-28 ENCOUNTER — Encounter: Payer: Self-pay | Admitting: Rehabilitative and Restorative Service Providers"

## 2018-01-28 ENCOUNTER — Ambulatory Visit: Payer: BLUE CROSS/BLUE SHIELD | Admitting: Occupational Therapy

## 2018-01-28 ENCOUNTER — Ambulatory Visit: Payer: BLUE CROSS/BLUE SHIELD | Admitting: Rehabilitative and Restorative Service Providers"

## 2018-01-28 DIAGNOSIS — R29818 Other symptoms and signs involving the nervous system: Secondary | ICD-10-CM

## 2018-01-28 DIAGNOSIS — R2681 Unsteadiness on feet: Secondary | ICD-10-CM

## 2018-01-28 DIAGNOSIS — R2689 Other abnormalities of gait and mobility: Secondary | ICD-10-CM

## 2018-01-28 DIAGNOSIS — R41841 Cognitive communication deficit: Secondary | ICD-10-CM

## 2018-01-28 DIAGNOSIS — M6281 Muscle weakness (generalized): Secondary | ICD-10-CM

## 2018-01-28 DIAGNOSIS — I69351 Hemiplegia and hemiparesis following cerebral infarction affecting right dominant side: Secondary | ICD-10-CM | POA: Diagnosis not present

## 2018-01-28 DIAGNOSIS — R4701 Aphasia: Secondary | ICD-10-CM

## 2018-01-28 NOTE — Therapy (Signed)
[facility] [address] [place], [place], [address] Phone: [phone]   Fax:  [phone]  Physical Therapy Treatment  Patient Details  Name: Margaret Ortiz MRN: [id number] Date of Birth: Dec 29, 1960 Referring Provider: Howard Pouch, DO   Encounter Date: 01/28/2018  PT End of Session - 01/28/18 1332    Visit Number  22    Number of Visits  33    Date for PT Re-Evaluation  02/27/18    Authorization Type  [employer]     PT Start Time  1154    PT Stop Time  1238    PT Time Calculation (min)  44 min    Equipment Utilized During Treatment  -- min guard to S    Activity Tolerance  Patient tolerated treatment well    Behavior During Therapy  [facility] for tasks assessed/performed       Past Medical History:  Diagnosis Date  . Aphasia 09/17/2017  . 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  . Cardiac LV ejection fraction 21-30%   . Cardioembolic stroke ([place]) 96/28/3662   Right hemiparesis  . Cardiomyopathy ([place]) 04/2017  . Dysphagia 05/19/2017   Last known diet upgraded to regular diet with thin liquids on 06/27/2017, no records available  . 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 Fall 09/17/2017  . Graves disease   . H/O ischemic left MCA stroke 09/17/2017  . Hemiparesis of right dominant side as late effect of cerebral infarction ([place]) 09/17/2017  . Hyperlipidemia   . Hypertension   . Neuropathy 05/19/2017   s/p stroke  . Plaque psoriasis   . Shoulder subluxation, right, initial encounter 09/17/2017  . Weakness 09/17/2017    Past Surgical History:  Procedure Laterality Date  . NO PAST SURGERIES      There were no vitals filed for this visit.  Subjective Assessment - 01/28/18 1313    Subjective  The patient and PT discussed plan of care as patient is asking "how long? will I keep seeing PT?"  Patient's speech limited  due to aphasia.    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      Currently in Pain?  No/denies         [facility] PT Assessment - 01/28/18 1213      Berg Balance Test   Sit to Stand  Able to stand without using hands and stabilize independently    Standing Unsupported  Able to stand safely 2 minutes    Sitting with Back Unsupported but Feet Supported on Floor or Stool  Able to sit safely and securely 2 minutes    Stand to Sit  Sits safely with minimal use of hands    Transfers  Able to transfer safely, minor use of hands    Standing Unsupported with Eyes Closed  Able to stand 10 seconds safely    Standing Ubsupported with Feet Together  Able to place feet together independently and stand 1 minute safely    From Standing, Reach Forward with Outstretched Arm  Can reach forward >12 cm safely (5")    From Standing Position, Pick up Object from Floor  Able to pick up shoe safely and easily    From Standing Position, Turn to Look Behind Over each  Shoulder  Looks behind from both sides and weight shifts well    Turn 360 Degrees  Able to turn 360 degrees safely but slowly    Standing Unsupported, Alternately Place Feet on Step/Stool  Able to complete >2 steps/needs minimal assist    Standing Unsupported, One Foot in Front  Able to take small step independently and hold 30 seconds    Standing on One Leg  Tries to lift leg/unable to hold 3 seconds but remains standing independently    Total Score  45    Berg comment:  45/56                   [facility] Adult PT Treatment/Exercise - 01/28/18 1213      Ambulation/Gait   Ambulation/Gait  Yes    Ambulation/Gait Assistance  6: Modified independent (Device/Increase time)    Ambulation/Gait Assistance Details  Mod indep x 600 ft    Ambulation Distance (Feet)  600 Feet    Assistive device   Straight cane    Gait Pattern  Decreased stance time - right;Decreased step length - left;Decreased stride length;Decreased hip/knee flexion - right;Decreased dorsiflexion - right;Decreased weight shift to right;Right circumduction;Right hip hike;Lateral hip instability;Poor foot clearance - right    Ambulation Surface  Level;Indoor      Self-Care   Self-Care  Other Self-Care Comments    Other Self-Care Comments   PT and patient discussed STGs and focus for next month of PT.  Patient inquires about when PT will end due to OT discussing d/c in 4 weeks.  PT noted that patient is making continued progress with mobility and is showing some improvement in gait speed since using SPC more.  At this time, will plan to continue x 4 weeks and consider renewal based on progress over the next 4 weeks.  She is also making progress with balance activities per Merrilee Jansky.      Neuro Re-ed    Neuro Re-ed Details   The patient and PT performed tall kneeling with UE support shifting to left to move into 1/2 kneel with R knee up with min A x 8 reps; then performed 1/2 knee with weight shft to right lifting L foot tapping and reaching activities to encourage R hip engagement.  Performed floor<>stand transfer with min A during floor activities x 2 reps.  Performed quadriped with bilat elbow weight bearing.                 PT Short Term Goals - 01/28/18 1212      PT SHORT TERM GOAL #1   Title  The patient will ambulate on level community surfaces with SPC mod indep for improved mobility.    Baseline  Using [facility] in community for limited surfaces.    Time  4    Period  Weeks    Status  Achieved      PT SHORT TERM GOAL #2   Title  The patient will improve gait speed from 0.85 ft/sec to > or equal to 1.2 ft/sec to demonstrate transition from "household ambulator" to "limited community ambulator" classification of gait.    Baseline  1.17 ft/sec on 01/28/18    Time  4    Period  Weeks    Status  Partially Met       PT SHORT TERM GOAL #3   Title  The patient will improve Berg balance score from 42/56 to > or equal to 45/56 to demo dec'ing risk for falls.  Baseline  Berg=45/56 on 01/28/18    Time  4    Period  Weeks    Status  Achieved      PT SHORT TERM GOAL #4   Title  The patient will ambulate x 600 ft nonstop to demo improved endurance for functional gait.    Baseline  Patient able to ambulate nonstop x 600 ft mod indep on level surfaces with SPc.     Time  4    Period  Weeks    Status  Achieved        PT Long Term Goals - 12/29/17 2012      PT LONG TERM GOAL #1   Title  The patient will perform updated HEP with intermittent assist for post d/c progression of exercise.    Time  8    Period  Weeks    Status  New    Target Date  02/27/18      PT LONG TERM GOAL #2   Title  The patient will move floor<>stand with mod indep using L UE support on surface due to h/o falls.    Time  8    Period  Weeks    Status  Revised    Target Date  02/27/18      PT LONG TERM GOAL #3   Title  The patient will improve gait speed from 0.85 ft/sec to > or equal to 1.5 ft/sec to demo dec'ing risk for falls.    Baseline  0.98 ft.sec on 12/22/2017    Time  8    Period  Weeks    Status  Revised    Target Date  02/27/18      PT LONG TERM GOAL #4   Title  The patient will improve Berg score from 42/56 (on 12/22/2017) up to 48/56 to demo dec'ing risk for falls.    Time  8    Period  Weeks    Status  Revised    Target Date  02/27/18      PT LONG TERM GOAL #5   Title  The patient will negotiate 12 steps without a handrail with step to pattern mod indep to access home entries.    Baseline  one handrail mod indep with step to pattern 12/22/2017    Time  8    Period  Weeks    Status  Revised    Target Date  02/27/18            Plan - 01/28/18 1332    Clinical Impression Statement  The patient met 3 STGs and partially met STG for gait speed.  She is showing improvement in gait speed since switching to [facility]  in home/limited community.  PT discussed continuing to LTGs emphasizing gait speed, right LE motor control, Trunk activities, and high level balance.  Patient also inquires today about floor exercises and gym routine-- PT will discuss options/access to community exercise wiht family when they are present.     PT Treatment/Interventions  ADLs/Self Care Home Management;Gait training;Stair training;Functional mobility training;Therapeutic activities;Therapeutic exercise;Balance training;Neuromuscular re-education;Patient/family education;Orthotic Fit/Training;Electrical Stimulation;Manual techniques;DME Instruction    PT Next Visit Plan  Treadmill to work on gait speed (push at faster speeds as tolerable), high level balance, Tall kneeling for right hip activation, R LE strengthening. work on knee flexion during gait.  Patient may be interested in community wellness/ clarify with family if they have access to gym.      Consulted and Agree with Plan of Care  Patient       Patient will benefit from skilled therapeutic intervention in order to improve the following deficits and impairments:  Abnormal gait, Impaired sensation, Pain, Postural dysfunction, Impaired tone, Decreased mobility, Decreased coordination, Decreased activity tolerance, Decreased endurance, Decreased strength, Difficulty walking, Decreased balance  Visit Diagnosis: Unsteadiness on feet  Other symptoms and signs involving the nervous system  Muscle weakness (generalized)  Other abnormalities of gait and mobility     Problem List Patient Active Problem List   Diagnosis Date Noted  . Fall 09/17/2017  . Hemiparesis of right dominant side as late effect of cerebral infarction ([place]) 09/17/2017  . Shoulder subluxation, right, initial encounter 09/17/2017  . Cardioembolic stroke ([place]) 07/86/7544  . Weakness 09/17/2017  . Aphasia 09/17/2017  . 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 Cardiomyopathy ([place])  09/17/2017  . H/O ischemic left MCA stroke 09/17/2017  . Cardiac LV ejection fraction 21-30%   . Graves disease   . Plaque psoriasis   . Neuropathy 05/19/2017    Trentin Knappenberger, PT 01/28/2018, 1:35 PM  [facility] [address] [place], [place], [address] Phone: [phone]   Fax:  [phone]  Name: Azaiah Mello MRN: [id number] Date of Birth: [birth year]

## 2018-01-28 NOTE — Therapy (Signed)
[facility] [address] [place], [place], [address] Phone: [phone]   Fax:  [phone]  Speech Language Pathology Treatment  Patient Details  Name: Margaret Ortiz MRN: [id number] Date of Birth: Sep 29, 1960 Referring Provider: Ma Hillock, DO   Encounter Date: 01/28/2018  End of Session - 01/28/18 1322    Visit Number  21    Number of Visits  33    Date for SLP Re-Evaluation  02/20/18    SLP Start Time  25    SLP Stop Time   1101    SLP Time Calculation (min)  41 min    Activity Tolerance  Patient tolerated treatment well       Past Medical History:  Diagnosis Date  . Aphasia 09/17/2017  . 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  . Cardiac LV ejection fraction 21-30%   . Cardioembolic stroke ([place]) 80/99/8338   Right hemiparesis  . Cardiomyopathy ([place]) 04/2017  . Dysphagia 05/19/2017   Last known diet upgraded to regular diet with thin liquids on 06/27/2017, no records available  . 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 Fall 09/17/2017  . Graves disease   . H/O ischemic left MCA stroke 09/17/2017  . Hemiparesis of right dominant side as late effect of cerebral infarction ([place]) 09/17/2017  . Hyperlipidemia   . Hypertension   . Neuropathy 05/19/2017   s/p stroke  . Plaque psoriasis   . Shoulder subluxation, right, initial encounter 09/17/2017  . Weakness 09/17/2017    Past Surgical History:  Procedure Laterality Date  . NO PAST SURGERIES      There were no vitals filed for this visit.  Subjective Assessment - 01/28/18 1026    Subjective  "There's a book -  I read - - about sroke (stroke)."   Patient is accompained by:  Family member Remo Lipps, brother in law    Currently in Pain?  No/denies            ADULT SLP TREATMENT - 01/28/18 1027      General Information   Behavior/Cognition   Alert;Cooperative;Pleasant mood      Treatment Provided   Treatment provided  Cognitive-Linquistic      Cognitive-Linquistic Treatment   Treatment focused on  Aphasia    Skilled Treatment  Patient arrived with [employer] Lingraphica device, brother in law attended tx today. SLP worked with pt to create icons in "family" section, as well as address for "me" section. Pt req'd usual cues for looking for icon picture (on device rather than on internet), min A usually for noting spelling errors. SLP could not locate any additional icons pt created at home in past week, or in therapy in her last session. SLP explained to brother in law some practical situations in which pt could use Lingraphica. Pt indicated the only thing she was using [place] for was for therapy activities. SLP and brother in law explained to pt that she needs augmentation to her current verbal communication for more complex conversational topics. Pt then shook her head and stated, "I --not -want- use." SLP again explained to pt she requires augmentation of verbal expression with her current language level, 9 months post-CVA, to communicate effectively.  Pt's brother in law asking pt questions about her spelling, repeatedly. SLP educated brother in law about pt's expressive aphasia was also present in her typing/writing.      Assessment /  Recommendations / Plan   Plan  Continue with current plan of care      Progression Toward Goals   Progression toward goals  Progressing toward goals       SLP Education - 01/28/18 1321    Education provided  Yes    Education Details  spelling is commensurate with pt's verbal language post CVA     Person(s) Educated  Patient;Caregiver(s)    Methods  Explanation    Comprehension  Verbalized understanding       SLP Short Term Goals - 01/28/18 1324      SLP SHORT TERM GOAL #6   Title  Pt will reduce safety risks by communicating personal needs, requests for help, directives, using AAC if  necessary, with occasional min A over 3 sessions.    Time  1    Period  Weeks    Status  On-going      SLP SHORT TERM GOAL #7   Title  Pt will increase communication effectiveness by answering questions in 90% of opportunities, using AAC if needed, over 3 sessions independently.    Time  1    Period  Weeks    Status  On-going      SLP SHORT TERM GOAL #8   Title  Pt will use multimodal communication/AAC when necessary in 5 minutes simple conversation with occasional min A over 3 sessions    Time  1    Period  Weeks    Status  On-going       SLP Long Term Goals - 01/28/18 1324      SLP LONG TERM GOAL #1   Title  Pt will participate functionally in 15 minutes simple-mod complex conversation with AAC for aphasia over 3 sessions.    Time  4    Period  Weeks    Status  On-going      SLP LONG TERM GOAL #2   Title  Pt will demo understanding of 10 minutes simple-mod complex conversation with compensations over 3 sessions    Time  4    Period  Weeks    Status  On-going      SLP LONG TERM GOAL #3   Title  Pt will demo error awareness by attempting correction or by nonverbal response to errors with 90% success    Time  4    Period  Weeks    Status  On-going      SLP LONG TERM GOAL #4   Title  Pt will use multimodal communication/AAC when necessary in 10 minutes simple-mod complex conversation with rare min A over 6 sessions    Time  4    Period  Weeks    Status  On-going      SLP LONG TERM GOAL #5   Title  pt will demo alternating attention between two simple cognitive-linguistic tasks for 85% accuracy with self correction/double checking answers (aphasia compensations allowed) over 3 sessions    Time  4    Period  Weeks    Status  On-going       Plan - 01/28/18 1322    Clinical Impression Statement  Pt continues to present with 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 See "skilled intervention" for details. Paraphasias and neologisms were readily  heard in pt's speech and seen in pt's typing on Lingraphica device. Pt indicated today she was not wanting to use Lingraphica to augment her communication. SLP will cont to monitor this. Continue skilled ST to maximize  functional communication for independence and QOL.     Speech Therapy Frequency  2x / week    Duration  -- 8 weeks    Treatment/Interventions  Multimodal communcation approach;Compensatory strategies;Language facilitation;Compensatory techniques;Cueing hierarchy;Internal/external aids;Functional tasks;SLP instruction and feedback;Patient/family education    Potential to Achieve Goals  Good    Potential Considerations  Severity of impairments    Consulted and Agree with Plan of Care  Patient       Patient will benefit from skilled therapeutic intervention in order to improve the following deficits and impairments:   Aphasia  Cognitive communication deficit    Problem List Patient Active Problem List   Diagnosis Date Noted  . Fall 09/17/2017  . Hemiparesis of right dominant side as late effect of cerebral infarction ([place]) 09/17/2017  . Shoulder subluxation, right, initial encounter 09/17/2017  . Cardioembolic stroke ([place]) 17/00/1749  . Weakness 09/17/2017  . Aphasia 09/17/2017  . 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 Cardiomyopathy ([place]) 09/17/2017  . H/O ischemic left MCA stroke 09/17/2017  . Cardiac LV ejection fraction 21-30%   . Graves disease   . Plaque psoriasis   . Neuropathy 05/19/2017    [facility] ,MS, CCC-SLP  01/28/2018, 1:27 PM  [facility] [address] [place], [place], [address] Phone: [phone]   Fax:  [phone]   Name: Margaret Ortiz MRN: [id number] Date of Birth: Mar 24, 1961

## 2018-01-28 NOTE — Therapy (Signed)
[facility] [address] [place], [place], [address] Phone: [phone]   Fax:  [phone]  Occupational Therapy Treatment  Patient Details  Name: Margaret Ortiz MRN: [id number] Date of Birth: [birth year] Referring Provider: Howard Pouch, DO   Encounter Date: 01/28/2018  OT End of Session - 01/28/18 1231    Visit Number  29    Number of Visits  36    Date for OT Re-Evaluation  02/20/18    Authorization Type  BC/BS    OT Start Time  1105    OT Stop Time  1150    OT Time Calculation (min)  45 min    Activity Tolerance  Patient tolerated treatment well    Behavior During Therapy  [facility] for tasks assessed/performed       Past Medical History:  Diagnosis Date  . Aphasia 09/17/2017  . 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  . Cardiac LV ejection fraction 21-30%   . Cardioembolic stroke ([place]) 51/70/0174   Right hemiparesis  . Cardiomyopathy ([place]) 04/2017  . Dysphagia 05/19/2017   Last known diet upgraded to regular diet with thin liquids on 06/27/2017, no records available  . 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 Fall 09/17/2017  . Graves disease   . H/O ischemic left MCA stroke 09/17/2017  . Hemiparesis of right dominant side as late effect of cerebral infarction ([place]) 09/17/2017  . Hyperlipidemia   . Hypertension   . Neuropathy 05/19/2017   s/p stroke  . Plaque psoriasis   . Shoulder subluxation, right, initial encounter 09/17/2017  . Weakness 09/17/2017    Past Surgical History:  Procedure Laterality Date  . NO PAST SURGERIES      There were no vitals filed for this visit.  Subjective Assessment - 01/28/18 1120    Patient is accompained by:  Family member brother-n-law    Pertinent History  Lt MCA CVA 05/19/2017, Graves dz    Patient Stated Goals  use my Rt arm    Currently in Pain?  No/denies                    OT Treatments/Exercises (OP) - 01/28/18 0001      Neurological Re-education Exercises   Other Exercises 1  Reviewed AA/ROM using bar stool and how to get maximal benefit out of exercise working in mid ranges and avoiding body assist and momentum    Other Weight-Bearing Exercises 1  Wt bearing over physioball quadraped w/ A/P wt shifts followed by disengaging LUE w/ min facilitation/support required RUE    Other Weight-Bearing Exercises 2  BUE wt bearing over chair for sit-stand movements, A/P wt shifts               OT Short Term Goals - 01/05/18 1137      OT SHORT TERM GOAL #6   Title  Pt to demo 25 degrees shoulder flexion in prep for low level reaching with gross finger flexion - goals due 01/21/2018    Time  4    Period  Weeks    Status  On-going      OT SHORT TERM GOAL #7   Title  Pt to perform environmental scanning at 90% accuracy during ambulation    Time  4    Period  Weeks    Status  On-going        OT Long  Term Goals - 01/05/18 1137      OT LONG TERM GOAL #1   Title  Pt to perform shower transfers at mod I level w/ DME prn and no LOB - goals due 02/18/2018    Time  8    Period  Weeks    Status  On-going Pt requires supervision to min assist at this time secondary to not having grab bars, other A/E in shower yet      OT LONG TERM GOAL #2   Title  Pt to perform simple meal prep at sup level using A/E and task modifications prn    Time  8    Period  Weeks    Status  On-going Pt now agrees to work on this again      OT [place] #3   Title  Pt to perform light house management tasks at sup level including: laundry, washing dishes, cleaning, making bed    Time  8    Period  Weeks    Status  On-going Pt reports she is already washing dishes      OT LONG TERM GOAL #4   Title  Pt to demo 25% finger extension in prep for releasing objects Rt hand     Time  8    Period  Weeks    Status  On-going      OT LONG TERM GOAL #5    Title  (LTG #5 moved to STG for renewal period)      OT LONG TERM GOAL #6   Title  Pt to use Rt hand as stabalizer 25% of the time    Time  8    Period  Weeks    Status  On-going      OT LONG TERM GOAL #7   Title  Pt/family to verbalize understanding with neccessary accommodations, A/E, etc that would be needed if pt is to return to living alone    Time  8    Period  Weeks    Status  On-going            Plan - 01/28/18 1232    Clinical Impression Statement  Pt tolerates wt bearing ex's well and demo movement in low range closed chain activities. No open chain movement RUE    Occupational Profile and client history currently impacting functional performance  no significant PMH but current impairments extensive    Rehab Potential  Fair    Current Impairments/barriers affecting progress:  severity of deficits    OT Frequency  2x / week    OT Duration  8 weeks    OT Treatment/Interventions  Self-care/ADL training;Moist Heat;DME and/or AE instruction;Splinting;Therapeutic activities;Psychosocial skills training;Aquatic Therapy;Cognitive remediation/compensation;Therapeutic exercise;Coping strategies training;Neuromuscular education;Functional Mobility Training;Passive range of motion;Visual/perceptual remediation/compensation;Electrical Stimulation;Manual Therapy;Patient/family education    Plan  begin checking progress towards remaining goals. Continue NMR    Consulted and Agree with Plan of Care  Patient    Family Member Consulted  brother-n-law        Patient will benefit from skilled therapeutic intervention in order to improve the following deficits and impairments:  Decreased coordination, Decreased range of motion, Difficulty walking, Improper body mechanics, Decreased endurance, Decreased safety awareness, Impaired sensation, Improper spinal/pelvic alignment, Impaired tone, Decreased knowledge of precautions, Decreased knowledge of use of DME, Decreased balance, Impaired UE  functional use, Pain, Decreased cognition, Decreased mobility, Decreased strength, Impaired perceived functional ability, Impaired vision/preception  Visit Diagnosis: Hemiplegia and hemiparesis following cerebral infarction affecting right  dominant side ([place])  Unsteadiness on feet  Other symptoms and signs involving the nervous system    Problem List Patient Active Problem List   Diagnosis Date Noted  . Fall 09/17/2017  . Hemiparesis of right dominant side as late effect of cerebral infarction ([place]) 09/17/2017  . Shoulder subluxation, right, initial encounter 09/17/2017  . Cardioembolic stroke ([place]) 86/57/8469  . Weakness 09/17/2017  . Aphasia 09/17/2017  . 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 Cardiomyopathy ([place]) 09/17/2017  . H/O ischemic left MCA stroke 09/17/2017  . Cardiac LV ejection fraction 21-30%   . Graves disease   . Plaque psoriasis   . Neuropathy 05/19/2017    Carey Bullocks, OTR/L 01/28/2018, 12:34 PM  [facility] [address] [place], [place], [address] Phone: [phone]   Fax:  [phone]  Name: Margaret Ortiz MRN: [id number] Date of Birth: [birth year]

## 2018-01-30 ENCOUNTER — Telehealth: Payer: Self-pay

## 2018-01-30 NOTE — Telephone Encounter (Signed)
Referral faxed to [facility] for Nurse/Social Worker assessment for community and home resources.

## 2018-02-02 ENCOUNTER — Encounter: Payer: Self-pay | Admitting: Rehabilitation

## 2018-02-02 ENCOUNTER — Ambulatory Visit: Payer: BLUE CROSS/BLUE SHIELD | Admitting: Rehabilitation

## 2018-02-02 ENCOUNTER — Ambulatory Visit: Payer: BLUE CROSS/BLUE SHIELD | Admitting: Speech Pathology

## 2018-02-02 ENCOUNTER — Telehealth: Payer: Self-pay

## 2018-02-02 ENCOUNTER — Ambulatory Visit: Payer: BLUE CROSS/BLUE SHIELD | Admitting: Occupational Therapy

## 2018-02-02 DIAGNOSIS — R41841 Cognitive communication deficit: Secondary | ICD-10-CM

## 2018-02-02 DIAGNOSIS — I69351 Hemiplegia and hemiparesis following cerebral infarction affecting right dominant side: Secondary | ICD-10-CM

## 2018-02-02 DIAGNOSIS — M6281 Muscle weakness (generalized): Secondary | ICD-10-CM

## 2018-02-02 DIAGNOSIS — R4701 Aphasia: Secondary | ICD-10-CM

## 2018-02-02 DIAGNOSIS — R2681 Unsteadiness on feet: Secondary | ICD-10-CM

## 2018-02-02 DIAGNOSIS — R2689 Other abnormalities of gait and mobility: Secondary | ICD-10-CM

## 2018-02-02 DIAGNOSIS — R29818 Other symptoms and signs involving the nervous system: Secondary | ICD-10-CM

## 2018-02-02 NOTE — Therapy (Signed)
[facility] [address] [place], [place], [address] Phone: [phone]   Fax:  [phone]  Occupational Therapy Treatment  Patient Details  Name: Margaret Ortiz MRN: [id number] Date of Birth: 1960-10-24 Referring Provider: Howard Pouch, DO   Encounter Date: 02/02/2018  OT End of Session - 02/02/18 1225    Visit Number  30    Number of Visits  36    Date for OT Re-Evaluation  02/20/18    Authorization Type  BC/BS    OT Start Time  1105    OT Stop Time  1145    OT Time Calculation (min)  40 min    Activity Tolerance  Patient tolerated treatment well    Behavior During Therapy  [facility] for tasks assessed/performed       Past Medical History:  Diagnosis Date  . Aphasia 09/17/2017  . 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  . Cardiac LV ejection fraction 21-30%   . Cardioembolic stroke ([place]) 26/94/8546   Right hemiparesis  . Cardiomyopathy ([place]) 04/2017  . Dysphagia 05/19/2017   Last known diet upgraded to regular diet with thin liquids on 06/27/2017, no records available  . 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 Fall 09/17/2017  . Graves disease   . H/O ischemic left MCA stroke 09/17/2017  . Hemiparesis of right dominant side as late effect of cerebral infarction ([place]) 09/17/2017  . Hyperlipidemia   . Hypertension   . Neuropathy 05/19/2017   s/p stroke  . Plaque psoriasis   . Shoulder subluxation, right, initial encounter 09/17/2017  . Weakness 09/17/2017    Past Surgical History:  Procedure Laterality Date  . NO PAST SURGERIES      There were no vitals filed for this visit.  Subjective Assessment - 02/02/18 1107    Subjective   I want someone to continue working my arm at home    Pertinent History  Lt MCA CVA 05/19/2017, Graves dz    Patient Stated Goals  use my Rt arm    Currently in Pain?  No/denies                    OT Treatments/Exercises (OP) - 02/02/18 0001      ADLs   ADL Comments  Reviewed neuro re-education ex's with patient that she can do at home and discussed proper technique and emphasis on quality of movement over multiple reps/quantity of movement. Also discussed schedule for pool therapy. Asked pt if family can come to next appointment to discuss schedule further and POC going forward. Began assessing remaining goals.                OT Short Term Goals - 01/05/18 1137      OT SHORT TERM GOAL #6   Title  Pt to demo 25 degrees shoulder flexion in prep for low level reaching with gross finger flexion - goals due 01/21/2018    Time  4    Period  Weeks    Status  On-going      OT SHORT TERM GOAL #7   Title  Pt to perform environmental scanning at 90% accuracy during ambulation    Time  4    Period  Weeks    Status  On-going        OT Long Term Goals - 02/02/18 1225      OT LONG  TERM GOAL #1   Title  Pt to perform shower transfers at mod I level w/ DME prn and no LOB - goals due 02/18/2018    Time  8    Period  Weeks    Status  Not Met   Pt requires supervision to min assist for safety.      OT LONG TERM GOAL #2   Title  Pt to perform simple meal prep at sup level using A/E and task modifications prn    Time  8    Period  Weeks    Status  On-going   Pt now agrees to work on this again     [place] #3   Title  Pt to perform light house management tasks at sup level including: laundry, washing dishes, cleaning, making bed    Time  8    Period  Weeks    Status  Achieved   per pt report     OT LONG TERM GOAL #4   Title  Pt to demo 25% finger extension in prep for releasing objects Rt hand     Time  8    Period  Weeks    Status  Not Met   no active finger extension     OT LONG TERM GOAL #5   Title  (LTG #5 moved to STG for renewal period)      OT LONG TERM GOAL #6   Title  Pt to use Rt hand as stabalizer 25% of the time     Time  8    Period  Weeks    Status  On-going      OT LONG TERM GOAL #7   Title  Pt/family to verbalize understanding with neccessary accommodations, A/E, etc that would be needed if pt is to return to living alone    Time  8    Period  Weeks    Status  Deferred   Pt will not be safe to return to living alone at this time           Plan - 02/02/18 1227    Clinical Impression Statement  Pt tolerates wt bearing ex's well and demo movement in low range closed chain activities. No open chain movement RUE. Pt w/ decreased awareness into deficits    Occupational Profile and client history currently impacting functional performance  no significant PMH but current impairments extensive    Occupational performance deficits (Please refer to evaluation for details):  ADL's;IADL's;Work;Leisure;Social Participation    Rehab Potential  Fair    Current Impairments/barriers affecting progress:  severity of deficits    OT Frequency  2x / week    OT Duration  8 weeks    OT Treatment/Interventions  Self-care/ADL training;Moist Heat;DME and/or AE instruction;Splinting;Therapeutic activities;Psychosocial skills training;Aquatic Therapy;Cognitive remediation/compensation;Therapeutic exercise;Coping strategies training;Neuromuscular education;Functional Mobility Training;Passive range of motion;Visual/perceptual remediation/compensation;Electrical Stimulation;Manual Therapy;Patient/family education    Plan  monitor BP for upcoming aquatic therapy, discuss A.T. schedule w/ pt's family to make sure Sept appointments ok, also continue progress towards remaining goals    Consulted and Agree with Plan of Care  Patient       Patient will benefit from skilled therapeutic intervention in order to improve the following deficits and impairments:  Decreased coordination, Decreased range of motion, Difficulty walking, Improper body mechanics, Decreased endurance, Decreased safety awareness, Impaired sensation,  Improper spinal/pelvic alignment, Impaired tone, Decreased knowledge of precautions, Decreased knowledge of use of DME, Decreased balance, Impaired UE functional  use, Pain, Decreased cognition, Decreased mobility, Decreased strength, Impaired perceived functional ability, Impaired vision/preception  Visit Diagnosis: Hemiplegia and hemiparesis following cerebral infarction affecting right dominant side ([facility])  Unsteadiness on feet  Other symptoms and signs involving the nervous system    Problem List Patient Active Problem List   Diagnosis Date Noted  . Fall 09/17/2017  . Hemiparesis of right dominant side as late effect of cerebral infarction ([place]) 09/17/2017  . Shoulder subluxation, right, initial encounter 09/17/2017  . Cardioembolic stroke ([place]) 69/45/0388  . Weakness 09/17/2017  . Aphasia 09/17/2017  . 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 Cardiomyopathy ([place]) 09/17/2017  . H/O ischemic left MCA stroke 09/17/2017  . Cardiac LV ejection fraction 21-30%   . Graves disease   . Plaque psoriasis   . Neuropathy 05/19/2017    Carey Bullocks, OTR/L 02/02/2018, 12:30 PM  [facility] [address] [place], [place], [address] Phone: [phone]   Fax:  [phone]  Name: Margaret Ortiz MRN: [id number] Date of Birth: 1960/07/08

## 2018-02-02 NOTE — Therapy (Signed)
[facility] [address] [place], [place], [address] Phone: [phone]   Fax:  [phone]  Physical Therapy Treatment  Patient Details  Name: Margaret Ortiz MRN: [id number] Date of Birth: 05/20/1961 Referring Provider: Howard Pouch, DO   Encounter Date: 02/02/2018  PT End of Session - 02/02/18 1536    Visit Number  23    Number of Visits  33    Date for PT Re-Evaluation  02/27/18    Authorization Type  BCBS     PT Start Time  1148    PT Stop Time  1233    PT Time Calculation (min)  45 min    Equipment Utilized During Treatment  --   min guard to S   Activity Tolerance  Patient tolerated treatment well    Behavior During Therapy  [facility] for tasks assessed/performed       Past Medical History:  Diagnosis Date  . Aphasia 09/17/2017  . 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  . Cardiac LV ejection fraction 21-30%   . Cardioembolic stroke ([place]) 30/16/0109   Right hemiparesis  . Cardiomyopathy ([place]) 04/2017  . Dysphagia 05/19/2017   Last known diet upgraded to regular diet with thin liquids on 06/27/2017, no records available  . 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 Fall 09/17/2017  . Graves disease   . H/O ischemic left MCA stroke 09/17/2017  . Hemiparesis of right dominant side as late effect of cerebral infarction ([place]) 09/17/2017  . Hyperlipidemia   . Hypertension   . Neuropathy 05/19/2017   s/p stroke  . Plaque psoriasis   . Shoulder subluxation, right, initial encounter 09/17/2017  . Weakness 09/17/2017    Past Surgical History:  Procedure Laterality Date  . NO PAST SURGERIES      There were no vitals filed for this visit.  Subjective Assessment - 02/02/18 1512    Subjective  Pt continues to ask "how long" in regards to needing PT, using cane and AFO.     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      Currently in Pain?  No/denies                       [facility] Adult PT Treatment/Exercise - 02/02/18 1149      Ambulation/Gait   Ambulation/Gait  Yes    Ambulation/Gait Assistance  4: Min assist;5: Supervision    Ambulation/Gait Assistance Details  Had pt ambulate with and without SPC (with use of spry step AFO) during session to conitnue to address quality of gait with improved postural control, improved R forward/lateral weight shift during stance, smoother L step (landing heel to toe), and improved R knee flex at terminal stance and into swing phase of gait.  Pt continues to have marked gait compensations when not using cane and requires increased faciliation from PT.  also note that she is ambulating around home without cane.  PT discussed that when she does not use cane, she has many more gait deviations and compensations vs when she ambulates with SPC, gait is much smoother, trunk is more upright and RLE is more engaged.  Pt verbalized understanding.      Ambulation Distance (Feet)  400 Feet  Assistive device  Straight cane;None    Gait Pattern  Decreased stance time - right;Decreased step length - left;Decreased stride length;Decreased hip/knee flexion - right;Decreased dorsiflexion - right;Decreased weight shift to right;Right circumduction;Right hip hike;Lateral hip instability;Poor foot clearance - right    Ambulation Surface  Level;Indoor      Neuro Re-ed    Neuro Re-ed Details   Worked in varying positions to attempt to isolate R hamstring activation.  attempted prone position, however due to pain/stretch in R shoulder, she was unable to remain here long enough to try exercise.  Had pt attempted in standing with R hip in slight extension with toe touch only to ground elevating into knee flexion, however pt tends to  compensate with overt hip/trunk movement, therefore discontinued.  Also attempted in sitting with shoe cover on shoe to decrease friction, however continued to note hip/trunk activation to compensate.  Attempted in supine with heel slides and she tends to perform SLR prior to flexing knee, therefore discontinued this task as well.  Ended session having pt semi seated on tall bar stool and placing LLE on ball to engage R LE in co-conctraction of quad/hamstirngs.  Intermittently able to achieve contraction, however unable to maintain for more than a few seconds without overt compensations.              PT Education - 02/02/18 1535    Education provided  Yes    Education Details  Continue to educate on CVA progress and the need to use AFO and cane at all times    Person(s) Educated  Patient    Methods  Explanation    Comprehension  Verbalized understanding       PT Short Term Goals - 01/28/18 1212      PT SHORT TERM GOAL #1   Title  The patient will ambulate on level community surfaces with SPC mod indep for improved mobility.    Baseline  Using SPC in community for limited surfaces.    Time  4    Period  Weeks    Status  Achieved      PT SHORT TERM GOAL #2   Title  The patient will improve gait speed from 0.85 ft/sec to > or equal to 1.2 ft/sec to demonstrate transition from "household ambulator" to "limited community ambulator" classification of gait.    Baseline  1.17 ft/sec on 01/28/18    Time  4    Period  Weeks    Status  Partially Met      PT SHORT TERM GOAL #3   Title  The patient will improve Berg balance score from 42/56 to > or equal to 45/56 to demo dec'ing risk for falls.    Baseline  Berg=45/56 on 01/28/18    Time  4    Period  Weeks    Status  Achieved      PT SHORT TERM GOAL #4   Title  The patient will ambulate x 600 ft nonstop to demo improved endurance for functional gait.    Baseline  Patient able to ambulate nonstop x 600 ft mod indep on level surfaces with  SPc.     Time  4    Period  Weeks    Status  Achieved        PT Long Term Goals - 12/29/17 2012      PT LONG TERM GOAL #1   Title  The patient will perform updated HEP with intermittent assist for post d/c progression   of exercise.    Time  8    Period  Weeks    Status  New    Target Date  02/27/18      PT LONG TERM GOAL #2   Title  The patient will move floor<>stand with mod indep using L UE support on surface due to h/o falls.    Time  8    Period  Weeks    Status  Revised    Target Date  02/27/18      PT LONG TERM GOAL #3   Title  The patient will improve gait speed from 0.85 ft/sec to > or equal to 1.5 ft/sec to demo dec'ing risk for falls.    Baseline  0.98 ft.sec on 12/22/2017    Time  8    Period  Weeks    Status  Revised    Target Date  02/27/18      PT LONG TERM GOAL #4   Title  The patient will improve Berg score from 42/56 (on 12/22/2017) up to 48/56 to demo dec'ing risk for falls.    Time  8    Period  Weeks    Status  Revised    Target Date  02/27/18      PT LONG TERM GOAL #5   Title  The patient will negotiate 12 steps without a handrail with step to pattern mod indep to access home entries.    Baseline  one handrail mod indep with step to pattern 12/22/2017    Time  8    Period  Weeks    Status  Revised    Target Date  02/27/18            Plan - 02/02/18 1536    Clinical Impression Statement  Skilled session focused on education/answering quesitons regarding progress with therapy, recovery following CVA, need for AFO/cane along with continuing to address gait quality with cane and AFO along with NMR exercises to address isolated R knee flexion.     PT Treatment/Interventions  ADLs/Self Care Home Management;Gait training;Stair training;Functional mobility training;Therapeutic activities;Therapeutic exercise;Balance training;Neuromuscular re-education;Patient/family education;Orthotic Fit/Training;Electrical Stimulation;Manual techniques;DME Instruction     PT Next Visit Plan  Treadmill to work on gait speed (push at faster speeds as tolerable), high level balance, Tall kneeling for right hip activation, R LE strengthening. work on knee flexion during gait.  Patient may be interested in community wellness/ clarify with family if they have access to gym.      Consulted and Agree with Plan of Care  Patient       Patient will benefit from skilled therapeutic intervention in order to improve the following deficits and impairments:  Abnormal gait, Impaired sensation, Pain, Postural dysfunction, Impaired tone, Decreased mobility, Decreased coordination, Decreased activity tolerance, Decreased endurance, Decreased strength, Difficulty walking, Decreased balance  Visit Diagnosis: Hemiplegia and hemiparesis following cerebral infarction affecting right dominant side (HCC)  Unsteadiness on feet  Muscle weakness (generalized)  Other abnormalities of gait and mobility     Problem List Patient Active Problem List   Diagnosis Date Noted  . Fall 09/17/2017  . Hemiparesis of right dominant side as late effect of cerebral infarction ([facility]) 09/17/2017  . Shoulder subluxation, right, initial encounter 09/17/2017  . Cardioembolic stroke ([facility]) 09/17/2017  . Weakness 09/17/2017  . Aphasia 09/17/2017  . 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 Cardiomyopathy ([facility]) 09/17/2017  . H/O ischemic left MCA stroke 09/17/2017  . Cardiac LV ejection fraction 21-30%   .   Graves disease   . Plaque psoriasis   . Neuropathy 05/19/2017    Emily Parcell, PT, MPT [facility] [address] [place], , [address] Phone: [phone]   Fax:  [phone] 02/02/18, 3:39 PM  Name: Gabreille Tersigni MRN: [id number] Date of Birth: 03/14/1961   

## 2018-02-02 NOTE — Therapy (Signed)
[facility] [address] [place], [place], [address] Phone: [phone]   Fax:  [phone]  Speech Language Pathology Treatment  Patient Details  Name: Margaret Ortiz MRN: [id number] Date of Birth: 1961-03-05 Referring Provider: Ma Hillock, DO   Encounter Date: 02/02/2018  End of Session - 02/02/18 1349    Visit Number  22    Number of Visits  33    Date for SLP Re-Evaluation  02/20/18    Authorization Type  [employer]    SLP Start Time  1016    SLP Stop Time   1101    SLP Time Calculation (min)  45 min    Activity Tolerance  Patient tolerated treatment well       Past Medical History:  Diagnosis Date  . Aphasia 09/17/2017  . 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  . Cardiac LV ejection fraction 21-30%   . Cardioembolic stroke ([facility]) 93/81/8299   Right hemiparesis  . Cardiomyopathy ([facility]) 04/2017  . Dysphagia 05/19/2017   Last known diet upgraded to regular diet with thin liquids on 06/27/2017, no records available  . 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 Fall 09/17/2017  . Graves disease   . H/O ischemic left MCA stroke 09/17/2017  . Hemiparesis of right dominant side as late effect of cerebral infarction ([facility]) 09/17/2017  . Hyperlipidemia   . Hypertension   . Neuropathy 05/19/2017   s/p stroke  . Plaque psoriasis   . Shoulder subluxation, right, initial encounter 09/17/2017  . Weakness 09/17/2017    Past Surgical History:  Procedure Laterality Date  . NO PAST SURGERIES      There were no vitals filed for this visit.  Subjective Assessment - 02/02/18 1343    Subjective  "I read -- everyday."    Currently in Pain?  No/denies            ADULT SLP TREATMENT - 02/02/18 1016      General Information   Behavior/Cognition  Alert;Cooperative;Pleasant mood      Treatment Provided   Treatment  provided  Cognitive-Linquistic      Cognitive-Linquistic Treatment   Treatment focused on  Aphasia    Skilled Treatment  Pt arrived alone to ST; she had not added any additional icons to her device and when SLP questioning pt to show how she interacts with the device at home, she again reported she is only using therapy activities section.  Discussed with pt that SGD is covered by insurance for use as a communication device, however pt does not wish to use the tablet in this manner. SLP assisted pt with signing up for a free account for online therapy activities (Talk[facility] therapy) and added items for pt to use for home practice from her desktop computer. Will return trial device to [facility]; no recommendation made for SGD at this time.       Assessment / Recommendations / Plan   Plan  Continue with current plan of care      Progression Toward Goals   Progression toward goals  Not progressing toward goals (comment)   SGD returned; will focus on multimodal comm and cognition      SLP Education - 02/02/18 1348    Education provided  Yes    Education Details  purpose of SGD    Person(s) Educated  Patient    Methods  Explanation  Comprehension  Verbalized understanding       SLP Short Term Goals - 02/02/18 1349      SLP SHORT TERM GOAL #6   Title  Pt will reduce safety risks by communicating personal needs, requests for help, directives, using AAC if necessary, with occasional min A over 3 sessions.    Time  1    Period  Weeks    Status  Not Met      SLP SHORT TERM GOAL #7   Title  Pt will increase communication effectiveness by answering questions in 90% of opportunities, using AAC if needed, over 3 sessions independently.    Time  1    Period  Weeks    Status  Not Met      SLP SHORT TERM GOAL #8   Title  Pt will use multimodal communication/AAC when necessary in 5 minutes simple conversation with occasional min A over 3 sessions    Time  1    Period  Weeks    Status  Not  Met       SLP Long Term Goals - 02/02/18 1349      SLP LONG TERM GOAL #1   Title  Pt will participate functionally in 15 minutes simple-mod complex conversation with multimodal communication over 3 sessions.    Time  3    Period  Weeks    Status  Revised      SLP LONG TERM GOAL #2   Title  Pt will demo understanding of 10 minutes simple-mod complex conversation with compensations over 3 sessions    Time  3    Period  Weeks    Status  On-going      SLP LONG TERM GOAL #3   Title  Pt will demo error awareness by attempting correction or by nonverbal response to errors with 90% success    Time  3    Period  Weeks    Status  On-going      SLP LONG TERM GOAL #4   Title  Pt will use multimodal communication/AAC when necessary in 10 minutes simple-mod complex conversation with rare min A over 6 sessions    Time  3    Period  Weeks    Status  On-going      SLP LONG TERM GOAL #5   Title  pt will demo alternating attention between two simple cognitive-linguistic tasks for 85% accuracy with self correction/double checking answers (aphasia compensations allowed) over 3 sessions    Time  3    Period  Weeks    Status  On-going       Plan - 02/02/18 1350    Clinical Impression Statement  Pt continues to present with 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 See "skilled intervention" for details. Paraphasias and neologisms were readily heard in pt's speech and seen in pt's typing on Lingraphica device. Pt again indicated today she was not wanting to use Lingraphica to augment her communication. Suspect pt's awareness of the severity of her deficits impacting her willingness to use the device. Will return [employer] device; assisted pt with registering for online account to access therapy activities from her home computer. Continue skilled ST to maximize functional communication for independence and QOL.     Speech Therapy Frequency  2x / week    Treatment/Interventions   Multimodal communcation approach;Compensatory strategies;Language facilitation;Compensatory techniques;Cueing hierarchy;Internal/external aids;Functional tasks;SLP instruction and feedback;Patient/family education    Potential to Achieve Goals  Good    Potential  Considerations  Severity of impairments    SLP Home Exercise Plan  provided    Consulted and Agree with Plan of Care  Patient       Patient will benefit from skilled therapeutic intervention in order to improve the following deficits and impairments:   Aphasia  Cognitive communication deficit    Problem List Patient Active Problem List   Diagnosis Date Noted  . Fall 09/17/2017  . Hemiparesis of right dominant side as late effect of cerebral infarction ([facility]) 09/17/2017  . Shoulder subluxation, right, initial encounter 09/17/2017  . Cardioembolic stroke ([facility]) 22/48/2500  . Weakness 09/17/2017  . Aphasia 09/17/2017  . 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 Cardiomyopathy ([facility]) 09/17/2017  . H/O ischemic left MCA stroke 09/17/2017  . Cardiac LV ejection fraction 21-30%   . Graves disease   . Plaque psoriasis   . Neuropathy 05/19/2017   Deneise Lever, [place], [place] Speech-Language Pathologist  Aliene Altes 02/02/2018, 1:57 PM  [facility] [address] [place], [place], [address] Phone: [phone]   Fax:  [phone]   Name: Margaret Ortiz MRN: [id number] Date of Birth: Jan 13, 1961

## 2018-02-02 NOTE — Patient Instructions (Signed)
[email or web address] :Talk[facility] Therapy ap (on your desktop or download TalkPath therapy for iPad)  Log in with your username : [email or web address]  and [place]: [email or web address]  [phone] [facility] Mondays 4-5 [email or web address] They are talking about starting a group in [place], but right now [facility] is the closest one.

## 2018-02-02 NOTE — Telephone Encounter (Signed)
VM left to schedule visit with Palliative Care 

## 2018-02-03 ENCOUNTER — Other Ambulatory Visit: Payer: Self-pay

## 2018-02-03 ENCOUNTER — Ambulatory Visit (HOSPITAL_COMMUNITY): Payer: BLUE CROSS/BLUE SHIELD | Attending: Cardiology

## 2018-02-03 ENCOUNTER — Telehealth: Payer: Self-pay

## 2018-02-03 DIAGNOSIS — Z8673 Personal history of transient ischemic attack (TIA), and cerebral infarction without residual deficits: Secondary | ICD-10-CM | POA: Insufficient documentation

## 2018-02-03 DIAGNOSIS — I509 Heart failure, unspecified: Secondary | ICD-10-CM | POA: Insufficient documentation

## 2018-02-03 DIAGNOSIS — E785 Hyperlipidemia, unspecified: Secondary | ICD-10-CM | POA: Insufficient documentation

## 2018-02-03 DIAGNOSIS — I429 Cardiomyopathy, unspecified: Secondary | ICD-10-CM | POA: Diagnosis not present

## 2018-02-03 DIAGNOSIS — I110 Hypertensive heart disease with heart failure: Secondary | ICD-10-CM | POA: Insufficient documentation

## 2018-02-03 DIAGNOSIS — I349 Nonrheumatic mitral valve disorder, unspecified: Secondary | ICD-10-CM | POA: Diagnosis not present

## 2018-02-03 NOTE — Telephone Encounter (Signed)
Received return call from patient's family to schedule visit with Palliative NP. Scheduled for 02/06/18 @ 10 am.

## 2018-02-04 ENCOUNTER — Telehealth: Payer: Self-pay | Admitting: Family Medicine

## 2018-02-04 ENCOUNTER — Encounter: Payer: BLUE CROSS/BLUE SHIELD | Admitting: Occupational Therapy

## 2018-02-04 NOTE — Telephone Encounter (Signed)
Copied from [place] [facility] [place] [phone]. Topic: Quick Communication - See Telephone Encounter >> Feb 04, 2018  9:57 AM Ivar Drape wrote: CRM for notification. See Telephone encounter for: 02/04/18. Please fax medication list to : Cephas Darby, case mgr w/[employer] [phone], Fax: [phone]

## 2018-02-05 ENCOUNTER — Encounter: Payer: Self-pay | Admitting: Rehabilitation

## 2018-02-05 ENCOUNTER — Encounter: Payer: BLUE CROSS/BLUE SHIELD | Admitting: Occupational Therapy

## 2018-02-05 ENCOUNTER — Ambulatory Visit: Payer: BLUE CROSS/BLUE SHIELD

## 2018-02-05 ENCOUNTER — Ambulatory Visit: Payer: BLUE CROSS/BLUE SHIELD | Admitting: Rehabilitation

## 2018-02-05 DIAGNOSIS — R29818 Other symptoms and signs involving the nervous system: Secondary | ICD-10-CM

## 2018-02-05 DIAGNOSIS — R4701 Aphasia: Secondary | ICD-10-CM

## 2018-02-05 DIAGNOSIS — R2689 Other abnormalities of gait and mobility: Secondary | ICD-10-CM

## 2018-02-05 DIAGNOSIS — R2681 Unsteadiness on feet: Secondary | ICD-10-CM

## 2018-02-05 DIAGNOSIS — M6281 Muscle weakness (generalized): Secondary | ICD-10-CM

## 2018-02-05 DIAGNOSIS — I69351 Hemiplegia and hemiparesis following cerebral infarction affecting right dominant side: Secondary | ICD-10-CM

## 2018-02-05 DIAGNOSIS — R41841 Cognitive communication deficit: Secondary | ICD-10-CM

## 2018-02-05 NOTE — Therapy (Signed)
[facility] [address] [place], [place], [address] Phone: [phone]   Fax:  [phone]  Speech Language Pathology Treatment  Patient Details  Name: Margaret Ortiz MRN: [id number] Date of Birth: 09/29/1960 Referring Provider: Kuneff, Renee A, DO   Encounter Date: 02/05/2018  End of Session - 02/05/18 1247    Visit Number  23    Number of Visits  33    Date for SLP Re-Evaluation  02/20/18    SLP Start Time  1104    SLP Stop Time   1146    SLP Time Calculation (min)  42 min    Activity Tolerance  Patient tolerated treatment well       Past Medical History:  Diagnosis Date  . Aphasia 09/17/2017  . 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  . Cardiac LV ejection fraction 21-30%   . Cardioembolic stroke ([facility]) 05/19/2017   Right hemiparesis  . Cardiomyopathy ([facility]) 04/2017  . Dysphagia 05/19/2017   Last known diet upgraded to regular diet with thin liquids on 06/27/2017, no records available  . 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 Fall 09/17/2017  . Graves disease   . H/O ischemic left MCA stroke 09/17/2017  . Hemiparesis of right dominant side as late effect of cerebral infarction ([facility]) 09/17/2017  . Hyperlipidemia   . Hypertension   . Neuropathy 05/19/2017   s/p stroke  . Plaque psoriasis   . Shoulder subluxation, right, initial encounter 09/17/2017  . Weakness 09/17/2017    Past Surgical History:  Procedure Laterality Date  . NO PAST SURGERIES      There were no vitals filed for this visit.  Subjective Assessment - 02/05/18 1246    Subjective  Pt did not bring Lingraphica to ST today. States she will return it next session.    Currently in Pain?  No/denies            ADULT SLP TREATMENT - 02/05/18 1243      General Information   Behavior/Cognition  Alert;Cooperative;Pleasant mood;Requires  cueing      Treatment Provided   Treatment provided  Cognitive-Linquistic      Cognitive-Linquistic Treatment   Treatment focused on  Aphasia    Skilled Treatment  Pt arrived alone to ST; Pt did not return device today, so SLP will return to Lingraphica asap. Today SLP worked on pt's expressive language at phrase level. SLP cued pt to reduce her rate of speech noting her speech was more efficient and accurate when she slows down. Pt communicated details about pictures with occasional min A - functional communication at phrase/short sentence level was 80% and improved to 90% with min A.       Assessment / Recommendations / Plan   Plan  Continue with current plan of care      Progression Toward Goals   Progression toward goals  Progressing toward goals       SLP Education - 02/05/18 1246    Education provided  Yes    Education Details  rate reduction brings incr'd accuracy and efficacy to her verbal communicative expressions    Person(s) Educated  Patient    Methods  Explanation;Demonstration    Comprehension  Verbalized understanding;Returned demonstration;Need further instruction;Verbal cues required       SLP Short Term Goals - 02/02/18 1349      SLP SHORT TERM GOAL #6     Title  Pt will reduce safety risks by communicating personal needs, requests for help, directives, using AAC if necessary, with occasional min A over 3 sessions.    Time  1    Period  Weeks    Status  Not Met      SLP SHORT TERM GOAL #7   Title  Pt will increase communication effectiveness by answering questions in 90% of opportunities, using AAC if needed, over 3 sessions independently.    Time  1    Period  Weeks    Status  Not Met      SLP SHORT TERM GOAL #8   Title  Pt will use multimodal communication/AAC when necessary in 5 minutes simple conversation with occasional min A over 3 sessions    Time  1    Period  Weeks    Status  Not Met       SLP Long Term Goals - 02/05/18 1248      SLP LONG  TERM GOAL #1   Title  Pt will participate functionally in 15 minutes simple-mod complex conversation with multimodal communication over 3 sessions.    Time  3    Period  Weeks    Status  Revised      SLP LONG TERM GOAL #2   Title  Pt will demo understanding of 10 minutes simple-mod complex conversation with compensations over 3 sessions    Baseline  02-05-18    Time  3    Period  Weeks    Status  On-going      SLP LONG TERM GOAL #3   Title  Pt will demo error awareness by attempting correction or by nonverbal response to errors with 90% success    Time  3    Period  Weeks    Status  On-going      SLP LONG TERM GOAL #4   Title  Pt will use multimodal communication/AAC when necessary in 10 minutes simple-mod complex conversation with rare min A over 6 sessions    Time  3    Period  Weeks      SLP LONG TERM GOAL #5   Title  pt will demo alternating attention between two simple cognitive-linguistic tasks for 85% accuracy with self correction/double checking answers (aphasia compensations allowed) over 3 sessions    Time  3    Period  Weeks    Status  On-going       Plan - 02/05/18 1247    Clinical Impression Statement  Pt continues to present with 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 See "skilled intervention" for details. Paraphasias and neologisms were noted routinely prior to SLP targeting slowed speech rate with pt. Continue skilled ST to maximize functional communication for independence and QOL.     Speech Therapy Frequency  2x / week    Duration  --   8 weeks   Treatment/Interventions  Multimodal communcation approach;Compensatory strategies;Language facilitation;Compensatory techniques;Cueing hierarchy;Internal/external aids;Functional tasks;SLP instruction and feedback;Patient/family education    Potential to Achieve Goals  Good    Potential Considerations  Severity of impairments    Consulted and Agree with Plan of Care  Patient        Patient will benefit from skilled therapeutic intervention in order to improve the following deficits and impairments:   Aphasia  Cognitive communication deficit    Problem List Patient Active Problem List   Diagnosis Date Noted  . Fall 09/17/2017  . Hemiparesis of right dominant side as  late effect of cerebral infarction ([facility]) 09/17/2017  . Shoulder subluxation, right, initial encounter 09/17/2017  . Cardioembolic stroke ([facility]) 34/19/3790  . Weakness 09/17/2017  . Aphasia 09/17/2017  . 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 Cardiomyopathy ([facility]) 09/17/2017  . H/O ischemic left MCA stroke 09/17/2017  . Cardiac LV ejection fraction 21-30%   . Graves disease   . Plaque psoriasis   . Neuropathy 05/19/2017    [facility] ,MS, CCC-SLP  02/05/2018, 12:49 PM  [facility] [address] [place], [place], [address] Phone: [phone]   Fax:  [phone]   Name: Karrie Fluellen MRN: [id number] Date of Birth: [birth year]

## 2018-02-05 NOTE — Therapy (Signed)
[facility] [address] [place], [place], [address] Phone: [phone]   Fax:  [phone]  Physical Therapy Treatment  Patient Details  Name: Margaret Ortiz MRN: [id number] Date of Birth: [birth year] Referring Provider: Howard Pouch, DO   Encounter Date: 02/05/2018  PT End of Session - 02/05/18 1237    Visit Number  24    Number of Visits  33    Date for PT Re-Evaluation  02/27/18    Authorization Type  BCBS     PT Start Time  0935    PT Stop Time  1018    PT Time Calculation (min)  43 min    Equipment Utilized During Treatment  --   min guard to S   Activity Tolerance  Patient tolerated treatment well    Behavior During Therapy  [facility] for tasks assessed/performed       Past Medical History:  Diagnosis Date  . Aphasia 09/17/2017  . 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  . Cardiac LV ejection fraction 21-30%   . Cardioembolic stroke ([place]) 41/66/0630   Right hemiparesis  . Cardiomyopathy ([place]) 04/2017  . Dysphagia 05/19/2017   Last known diet upgraded to regular diet with thin liquids on 06/27/2017, no records available  . 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 Fall 09/17/2017  . Graves disease   . H/O ischemic left MCA stroke 09/17/2017  . Hemiparesis of right dominant side as late effect of cerebral infarction ([place]) 09/17/2017  . Hyperlipidemia   . Hypertension   . Neuropathy 05/19/2017   s/p stroke  . Plaque psoriasis   . Shoulder subluxation, right, initial encounter 09/17/2017  . Weakness 09/17/2017    Past Surgical History:  Procedure Laterality Date  . NO PAST SURGERIES      There were no vitals filed for this visit.  Subjective Assessment - 02/05/18 1226    Subjective  Pt with questions about R shoulder during PT session.     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      Currently in Pain?  No/denies                       [facility] Adult PT Treatment/Exercise - 02/05/18 0940      Ambulation/Gait   Ambulation/Gait  Yes    Ambulation/Gait Assistance  5: Supervision;4: Min guard;4: Min assist    Ambulation/Gait Assistance Details  Utilized Bioness (both units but only R thigh cuff active on hamstrings) during all gait today in addition to use of R AFO in order to improve R knee flex during swing phase of gait and reduce compensatory R hip hike during swing phase of gait.  Also continue to work to increase gait speed and work towards more 2 point gait pattern.  Provided facilitation at pelvis for decreased hip hike and decreased trunk rotation with facilitation to improve forward R translation during stance and cues for posture along with cues to do "as little as possible" to clear R leg.  PT also provided intermittent tactile cues at R hamstrings during gait to initiate knee flex quicker for more 2 point gait pattern.  Pt is close to  safe 2 point pattern during sessions with facilitation from PT, hower do not note a lot of carryover without Bioness and facilitation.      Ambulation Distance (Feet)  500 Feet    Assistive device  Straight cane    Gait Pattern  Decreased stance time - right;Decreased step length - left;Decreased stride length;Decreased hip/knee flexion - right;Decreased dorsiflexion - right;Decreased weight shift to right;Right circumduction;Right hip hike;Lateral hip instability;Poor foot clearance - right    Ambulation Surface  Level;Indoor      Self-Care   Self-Care  Other Self-Care Comments    Other Self-Care Comments   Pt with questions regarding R shoulder due to noted subluxation.  She asked when would this be normal again or if doing exercises would put it  back in place.  She also had questions regarding positioning at home.  Provided education that it would depend on the amount of return and activation of musculature in R shoulder to whether this would improve/correct.  Also educated that when she is sitting, arm should be propped on arm rest with pillow if needed to maintain neutral position, but not to elevate shoulder as this can cause pain from poor positioning.  Pt with questions regarding taping for R shoulder subluxation.  PT reports that she would direct this question towards OT for next session. Pt verbalized understanding to all.  Will continue education.       Neuro Re-ed    Neuro Re-ed Details   In // bars had pt work on isolating R  hamstring use with Bioness on R thigh in training mode.  Performed hamstring curl with R hip in slight extension and PT providing assist at hip/trunk to decrease compensations along with intermittent assist to elevate RLE using hamstrings only.  Progressed to step ups with RLE on 4" step with stim on during both up and down motions for improved R knee flex.  Tactile cues given as well.  Utilized agility ladder on floor to maintain appropriate step length along with focus on improving R step width as she still tends to abduct RLE at times without fully loading RLE.  Performed x 6 laps down and back with cues for step through pattern and light tactile cues at knee for improved flexion.       Acupuncturist Location  R hamstrings    Electrical Stimulation Action  R knee flex during swing    Electrical Stimulation Parameters  See tablet 1 for parameters    Electrical Stimulation Goals  Neuromuscular facilitation             PT Education - 02/05/18 1237    Education provided  Yes    Education Details  See self care, Also answering questions regarding return to work and encouraging her to communicate with SLP regarding matter.     Person(s) Educated  Patient    Methods   Explanation    Comprehension  Verbalized understanding;Need further instruction       PT Short Term Goals - 01/28/18 1212      PT SHORT TERM GOAL #1   Title  The patient will ambulate on level community surfaces with SPC mod indep for improved mobility.    Baseline  Using [facility] in community for limited surfaces.    Time  4    Period  Weeks    Status  Achieved      PT SHORT TERM GOAL #2   Title  The patient will improve  gait speed from 0.85 ft/sec to > or equal to 1.2 ft/sec to demonstrate transition from "household ambulator" to "limited community ambulator" classification of gait.    Baseline  1.17 ft/sec on 01/28/18    Time  4    Period  Weeks    Status  Partially Met      PT SHORT TERM GOAL #3   Title  The patient will improve Berg balance score from 42/56 to > or equal to 45/56 to demo dec'ing risk for falls.    Baseline  Berg=45/56 on 01/28/18    Time  4    Period  Weeks    Status  Achieved      PT SHORT TERM GOAL #4   Title  The patient will ambulate x 600 ft nonstop to demo improved endurance for functional gait.    Baseline  Patient able to ambulate nonstop x 600 ft mod indep on level surfaces with SPc.     Time  4    Period  Weeks    Status  Achieved        PT Long Term Goals - 12/29/17 2012      PT LONG TERM GOAL #1   Title  The patient will perform updated HEP with intermittent assist for post d/c progression of exercise.    Time  8    Period  Weeks    Status  New    Target Date  02/27/18      PT LONG TERM GOAL #2   Title  The patient will move floor<>stand with mod indep using L UE support on surface due to h/o falls.    Time  8    Period  Weeks    Status  Revised    Target Date  02/27/18      PT LONG TERM GOAL #3   Title  The patient will improve gait speed from 0.85 ft/sec to > or equal to 1.5 ft/sec to demo dec'ing risk for falls.    Baseline  0.98 ft.sec on 12/22/2017    Time  8    Period  Weeks    Status  Revised    Target Date  02/27/18      PT  LONG TERM GOAL #4   Title  The patient will improve Berg score from 42/56 (on 12/22/2017) up to 48/56 to demo dec'ing risk for falls.    Time  8    Period  Weeks    Status  Revised    Target Date  02/27/18      PT LONG TERM GOAL #5   Title  The patient will negotiate 12 steps without a handrail with step to pattern mod indep to access home entries.    Baseline  one handrail mod indep with step to pattern 12/22/2017    Time  8    Period  Weeks    Status  Revised    Target Date  02/27/18            Plan - 02/05/18 1237    Clinical Impression Statement  Skilled session utilized Bioness thigh unit only (had lower cuff donned around AFO) for hamstring activation during swing phase of gait along with other NMR activities.  Note improvement within session with gait pattern and speed, however pt has difficulty maintaining this from session to session.     PT Treatment/Interventions  ADLs/Self Care Home Management;Gait training;Stair training;Functional mobility training;Therapeutic activities;Therapeutic exercise;Balance training;Neuromuscular re-education;Patient/family education;Orthotic Fit/Training;Electrical Stimulation;Manual techniques;DME Instruction  PT Next Visit Plan  Treadmill to work on gait speed (push at faster speeds as tolerable), high level balance, Tall kneeling for right hip activation, R LE strengthening. work on knee flexion during gait.  Patient may be interested in community wellness/ clarify with family if they have access to gym.      Consulted and Agree with Plan of Care  Patient       Patient will benefit from skilled therapeutic intervention in order to improve the following deficits and impairments:  Abnormal gait, Impaired sensation, Pain, Postural dysfunction, Impaired tone, Decreased mobility, Decreased coordination, Decreased activity tolerance, Decreased endurance, Decreased strength, Difficulty walking, Decreased balance  Visit Diagnosis: Hemiplegia and  hemiparesis following cerebral infarction affecting right dominant side (HCC)  Unsteadiness on feet  Other symptoms and signs involving the nervous system  Muscle weakness (generalized)  Other abnormalities of gait and mobility     Problem List Patient Active Problem List   Diagnosis Date Noted  . Fall 09/17/2017  . Hemiparesis of right dominant side as late effect of cerebral infarction ([place]) 09/17/2017  . Shoulder subluxation, right, initial encounter 09/17/2017  . Cardioembolic stroke ([place]) 32/35/5732  . Weakness 09/17/2017  . Aphasia 09/17/2017  . 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 Cardiomyopathy ([place]) 09/17/2017  . H/O ischemic left MCA stroke 09/17/2017  . Cardiac LV ejection fraction 21-30%   . Graves disease   . Plaque psoriasis   . Neuropathy 05/19/2017    Cameron Sprang, PT, MPT [facility] [address] [place], [place], [address] Phone: [phone]   Fax:  [phone] 02/05/18, 12:40 PM  Name: Margaret Ortiz MRN: [id number] Date of Birth: Nov 01, 1960

## 2018-02-06 ENCOUNTER — Other Ambulatory Visit: Payer: BLUE CROSS/BLUE SHIELD | Admitting: Nurse Practitioner

## 2018-02-06 DIAGNOSIS — I69320 Aphasia following cerebral infarction: Secondary | ICD-10-CM

## 2018-02-06 DIAGNOSIS — I63419 Cerebral infarction due to embolism of unspecified middle cerebral artery: Secondary | ICD-10-CM

## 2018-02-06 DIAGNOSIS — Z515 Encounter for palliative care: Secondary | ICD-10-CM

## 2018-02-06 DIAGNOSIS — G8191 Hemiplegia, unspecified affecting right dominant side: Secondary | ICD-10-CM

## 2018-02-06 NOTE — Progress Notes (Signed)
PALLIATIVE CARE CONSULT VISIT   PATIENT NAME: Margaret Ortiz DOB: [birth year] MRN: [id number]  PRIMARY CARE PROVIDER:   Ma Hillock, DO  REFERRING PROVIDER:  Ma Hillock, DO [address] [place], [place] [address]  RESPONSIBLE PARTY:   Royal Hawthorn (Brother) [phone] (mobile)  ASSESSMENT/RECOMMENDATIONS and PLAN:  S/p left MCA w right HP and aphasia -rt. Side muscle spasms HLD -Currently in PT/OT and speech therapy -eliquis 5mg  BID -baclofen 5mg  TID -lipitor 40mg  qd  Chronic medical issues GERD Mixed HF EF 25-30% HTN -esomepraxole 40mg  qd Metoprolol 12.5mg  BID  Social issues -family requesting SW visit for community resources for patient -Jeani Hawking Duffy notified of consult  ACP -to be determined   I spent 62minutes minutes providing this consultation,  from 10;00 to 11:00. More than 50% of the time in this consultation was spent coordinating communication.   HISTORY OF PRESENT ILLNESS:  Margaret Ortiz is a 57 y.o. year old female with multiple medical problems including s/p left MCA CA suspect embolic due to low EF initially of 15-20% now 35-30%. Palliative Care was asked to help with symptom management, patient and family support and to  address goals of care.   CODE STATUS: TBD  PPS: 60% HOSPICE ELIGIBILITY/DIAGNOSIS: TBD  PAST MEDICAL HISTORY:  Past Medical History:  Diagnosis Date  . Aphasia 09/17/2017  . 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  . Cardiac LV ejection fraction 21-30%   . Cardioembolic stroke ([place]) 38/46/6599   Right hemiparesis  . Cardiomyopathy ([place]) 04/2017  . Dysphagia 05/19/2017   Last known diet upgraded to regular diet with thin liquids on 06/27/2017, no records available  . 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 Fall 09/17/2017  . Graves disease   . H/O ischemic left MCA stroke 09/17/2017  . Hemiparesis of right dominant side  as late effect of cerebral infarction ([place]) 09/17/2017  . Hyperlipidemia   . Hypertension   . Neuropathy 05/19/2017   s/p stroke  . Plaque psoriasis   . Shoulder subluxation, right, initial encounter 09/17/2017  . Weakness 09/17/2017    SOCIAL HX:  Social History   Tobacco Use  . Smoking status: Former Research scientist (life sciences)  . Smokeless tobacco: Never Used  Substance Use Topics  . Alcohol use: Never    Frequency: Never    ALLERGIES:  Allergies  Allergen Reactions  . Dust Mite Extract   . Penicillins      PERTINENT MEDICATIONS:  Outpatient Encounter Medications as of 02/06/2018  Medication Sig  . acetaminophen (TYLENOL) 325 MG tablet Take 650 mg by mouth every 6 (six) hours as needed.  Marland Kitchen apixaban (ELIQUIS) 5 MG TABS tablet Take 1 tablet (5 mg total) by mouth 2 (two) times daily.  Marland Kitchen atorvastatin (LIPITOR) 40 MG tablet Take 1 tablet (40 mg total) by mouth daily.  . baclofen 5 MG TABS Take 5 mg by mouth 3 (three) times daily.  . clobetasol cream (TEMOVATE) 3.57 % Apply 1 application topically 2 (two) times daily.  Marland Kitchen esomeprazole (NEXIUM) 40 MG capsule Take 1 capsule (40 mg total) by mouth daily at 12 noon.  . gabapentin (NEURONTIN) 300 MG capsule Take 2 capsules (600 mg total) by mouth at bedtime.  . metoprolol tartrate (LOPRESSOR) 25 MG tablet Take 0.5 tablets (12.5 mg total) by mouth 2 (two) times daily.  Marland Kitchen senna (SENOKOT) 8.6 MG TABS tablet Take 1 tablet by mouth.  Marland Kitchen  sodium chloride (OCEAN) 0.65 % SOLN nasal spray Place 1 spray into both nostrils as needed for congestion.   No facility-administered encounter medications on file as of 02/06/2018.     PHYSICAL EXAM:   General: NAD,  Cardiovascular: regular rate and rhythm Pulmonary: clear ant fields Abdomen: soft, nontender, + bowel sounds GU: no suprapubic tenderness Extremities: no edema, no joint deformities Skin: no rashes Neurological: right sided hemiparesis; slurred slow speech  Stephanie G Martinique, NP

## 2018-02-09 ENCOUNTER — Encounter: Payer: Self-pay | Admitting: Rehabilitation

## 2018-02-09 ENCOUNTER — Ambulatory Visit: Payer: BLUE CROSS/BLUE SHIELD | Admitting: Occupational Therapy

## 2018-02-09 ENCOUNTER — Ambulatory Visit: Payer: BLUE CROSS/BLUE SHIELD | Admitting: Speech Pathology

## 2018-02-09 ENCOUNTER — Encounter: Payer: Self-pay | Admitting: Occupational Therapy

## 2018-02-09 ENCOUNTER — Ambulatory Visit: Payer: BLUE CROSS/BLUE SHIELD | Admitting: Rehabilitation

## 2018-02-09 VITALS — BP 110/67 | HR 66

## 2018-02-09 DIAGNOSIS — R41841 Cognitive communication deficit: Secondary | ICD-10-CM

## 2018-02-09 DIAGNOSIS — R4701 Aphasia: Secondary | ICD-10-CM

## 2018-02-09 DIAGNOSIS — R208 Other disturbances of skin sensation: Secondary | ICD-10-CM

## 2018-02-09 DIAGNOSIS — R2689 Other abnormalities of gait and mobility: Secondary | ICD-10-CM

## 2018-02-09 DIAGNOSIS — M6281 Muscle weakness (generalized): Secondary | ICD-10-CM

## 2018-02-09 DIAGNOSIS — I69351 Hemiplegia and hemiparesis following cerebral infarction affecting right dominant side: Secondary | ICD-10-CM

## 2018-02-09 DIAGNOSIS — G8929 Other chronic pain: Secondary | ICD-10-CM

## 2018-02-09 DIAGNOSIS — R41842 Visuospatial deficit: Secondary | ICD-10-CM

## 2018-02-09 DIAGNOSIS — R29818 Other symptoms and signs involving the nervous system: Secondary | ICD-10-CM

## 2018-02-09 DIAGNOSIS — I69318 Other symptoms and signs involving cognitive functions following cerebral infarction: Secondary | ICD-10-CM

## 2018-02-09 DIAGNOSIS — R278 Other lack of coordination: Secondary | ICD-10-CM

## 2018-02-09 DIAGNOSIS — M25511 Pain in right shoulder: Secondary | ICD-10-CM

## 2018-02-09 DIAGNOSIS — R2681 Unsteadiness on feet: Secondary | ICD-10-CM

## 2018-02-09 NOTE — Therapy (Signed)
[facility] [address] [place], [place], [address] Phone: [phone]   Fax:  [phone]  Speech Language Pathology Treatment  Patient Details  Name: Margaret Ortiz MRN: [id number] Date of Birth: [birth year] Referring Provider: Ma Hillock, DO   Encounter Date: 02/09/2018  End of Session - 02/09/18 1312    Visit Number  24    Number of Visits  33    Date for SLP Re-Evaluation  02/20/18    Authorization Type  BCBS    SLP Start Time  4401    SLP Stop Time   1101    SLP Time Calculation (min)  46 min    Activity Tolerance  Patient tolerated treatment well       Past Medical History:  Diagnosis Date  . Aphasia 09/17/2017  . 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  . Cardiac LV ejection fraction 21-30%   . Cardioembolic stroke ([place]) 02/72/5366   Right hemiparesis  . Cardiomyopathy ([place]) 04/2017  . Dysphagia 05/19/2017   Last known diet upgraded to regular diet with thin liquids on 06/27/2017, no records available  . 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 Fall 09/17/2017  . Graves disease   . H/O ischemic left MCA stroke 09/17/2017  . Hemiparesis of right dominant side as late effect of cerebral infarction ([place]) 09/17/2017  . Hyperlipidemia   . Hypertension   . Neuropathy 05/19/2017   s/p stroke  . Plaque psoriasis   . Shoulder subluxation, right, initial encounter 09/17/2017  . Weakness 09/17/2017    Past Surgical History:  Procedure Laterality Date  . NO PAST SURGERIES      There were no vitals filed for this visit.  Subjective Assessment - 02/09/18 1304    Subjective  "Oh, I forgot my (gestures to wall to indicate Surveyor, minerals).     Currently in Pain?  No/denies            ADULT SLP TREATMENT - 02/09/18 1015      General Information   Behavior/Cognition   Alert;Cooperative;Pleasant mood;Requires cueing      Treatment Provided   Treatment provided  Cognitive-Linquistic      Pain Assessment   Pain Assessment  No/denies pain      Cognitive-Linquistic Treatment   Treatment focused on  Aphasia    Skilled Treatment  Pt again arrived alone to ST, did not bring her Surveyor, minerals. SLP faciliated simple-mod complex conversation, cuing pt for slower rate to improve accuracy. At times pt seemed reluctant to use compensations for anomia (description), but did so with encouragement. SLP told pt to focus on communicating her message vs precise wordfinding. Pt did describe the "doctor for my heart," when unable to find the word "cardiologist." Pt perseverating on returning to "normal speech" and movement. SLP has repeatedly educated pt re: stroke recovery and told pt that it is unlikely her speech will return to "normal." SLP praised pt for her motivation and hard work to try to improve her speech, as well as that aside from motor speech issues, pt's aphasia/language deficits are likely to persist at some level. Pt was tearful several times during the session; SLP provided resources to pt via email at her request for stroke support group, aphasia community resources, and vocational rehabilitation. Pt also seemed open to discussing her situation with a therapist or counselor.       Assessment /  Recommendations / Plan   Plan  Continue with current plan of care      Progression Toward Goals   Progression toward goals  Progressing toward goals       SLP Education - 02/09/18 1311    Education provided  Yes    Education Details  stroke support group, aphasia community resources    [employer]) Educated  Patient    Methods  Explanation;Handout    Comprehension  Verbalized understanding;Need further instruction       SLP Short Term Goals - 02/09/18 1315      SLP SHORT TERM GOAL #6   Title  Pt will reduce safety risks by communicating personal needs,  requests for help, directives, using AAC if necessary, with occasional min A over 3 sessions.    Status  Not Met      SLP SHORT TERM GOAL #7   Title  Pt will increase communication effectiveness by answering questions in 90% of opportunities, using AAC if needed, over 3 sessions independently.    Status  Not Met      SLP SHORT TERM GOAL #8   Title  Pt will use multimodal communication/AAC when necessary in 5 minutes simple conversation with occasional min A over 3 sessions    Status  Not Met       SLP Long Term Goals - 02/09/18 1315      SLP LONG TERM GOAL #1   Title  Pt will participate functionally in 15 minutes simple-mod complex conversation with multimodal communication over 3 sessions.    Baseline  12/15/17, 02/09/18    Time  2    Period  Weeks    Status  On-going      SLP LONG TERM GOAL #2   Title  Pt will demo understanding of 10 minutes simple-mod complex conversation with compensations over 3 sessions    Baseline  02-05-18, 02/09/18    Time  2    Period  Weeks    Status  On-going      SLP LONG TERM GOAL #3   Title  Pt will demo error awareness by attempting correction or by nonverbal response to errors with 90% success    Time  2    Period  Weeks    Status  On-going      SLP LONG TERM GOAL #4   Title  Pt will use multimodal communication/AAC when necessary in 10 minutes simple-mod complex conversation with rare min A over 6 sessions    Time  2    Period  Weeks    Status  On-going      SLP LONG TERM GOAL #5   Title  pt will demo alternating attention between two simple cognitive-linguistic tasks for 85% accuracy with self correction/double checking answers (aphasia compensations allowed) over 3 sessions    Time  2    Period  Weeks    Status  On-going       Plan - 02/09/18 1312    Clinical Impression Statement  Pt continues to present with 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 See "skilled intervention" for details. Paraphasias and  neologisms were noted routinely prior to SLP targeting slowed speech rate with pt. Despite repeated education re: prognosis pt continues to perseverate on returning to "normal speech." She reports to SLP she has been walking "3 miles" in her house unassisted and without a cane despite instruction from PT because she feels this will help her get her movement back. Neuropsych consult  may be beneficial. Continue skilled ST to maximize functional communication for independence and QOL.     Speech Therapy Frequency  2x / week    Treatment/Interventions  Multimodal communcation approach;Compensatory strategies;Language facilitation;Compensatory techniques;Cueing hierarchy;Internal/external aids;Functional tasks;SLP instruction and feedback;Patient/family education    Potential to Achieve Goals  Fair    Potential Considerations  Severity of impairments;Ability to learn/carryover information    Consulted and Agree with Plan of Care  Patient       Patient will benefit from skilled therapeutic intervention in order to improve the following deficits and impairments:   Aphasia  Cognitive communication deficit    Problem List Patient Active Problem List   Diagnosis Date Noted  . Fall 09/17/2017  . Hemiparesis of right dominant side as late effect of cerebral infarction ([place]) 09/17/2017  . Shoulder subluxation, right, initial encounter 09/17/2017  . Cardioembolic stroke ([place]) 75/17/0017  . Weakness 09/17/2017  . Aphasia 09/17/2017  . 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 Cardiomyopathy ([place]) 09/17/2017  . H/O ischemic left MCA stroke 09/17/2017  . Cardiac LV ejection fraction 21-30%   . Graves disease   . Plaque psoriasis   . Neuropathy 05/19/2017   Deneise Lever, [place], [place] Speech-Language Pathologist   Aliene Altes 02/09/2018, 1:16 PM  [facility] [address] [place], [place], [address] Phone:  [phone]   Fax:  [phone]   Name: Margaret Ortiz MRN: [id number] Date of Birth: 22-Nov-1960

## 2018-02-09 NOTE — Therapy (Signed)
[facility] [address] [place], [place], [address] Phone: [phone]   Fax:  [phone]  Physical Therapy Treatment  Patient Details  Name: Margaret Ortiz MRN: [id number] Date of Birth: 08-Jun-1961 Referring Provider: Howard Pouch, DO   Encounter Date: 02/09/2018  PT End of Session - 02/09/18 1510    Visit Number  25    Number of Visits  33    Date for PT Re-Evaluation  02/27/18    Authorization Type  BCBS     PT Start Time  1149    PT Stop Time  1233    PT Time Calculation (min)  44 min    Equipment Utilized During Treatment  --   min guard to S   Activity Tolerance  Patient tolerated treatment well    Behavior During Therapy  [facility] for tasks assessed/performed       Past Medical History:  Diagnosis Date  . Aphasia 09/17/2017  . 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  . Cardiac LV ejection fraction 21-30%   . Cardioembolic stroke ([place]) 22/97/9892   Right hemiparesis  . Cardiomyopathy ([place]) 04/2017  . Dysphagia 05/19/2017   Last known diet upgraded to regular diet with thin liquids on 06/27/2017, no records available  . 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 Fall 09/17/2017  . Graves disease   . H/O ischemic left MCA stroke 09/17/2017  . Hemiparesis of right dominant side as late effect of cerebral infarction ([place]) 09/17/2017  . Hyperlipidemia   . Hypertension   . Neuropathy 05/19/2017   s/p stroke  . Plaque psoriasis   . Shoulder subluxation, right, initial encounter 09/17/2017  . Weakness 09/17/2017    Past Surgical History:  Procedure Laterality Date  . NO PAST SURGERIES      There were no vitals filed for this visit.  Subjective Assessment - 02/09/18 1151    Subjective  Pt reports having good OT session.     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      Currently in Pain?  No/denies                       [facility] Adult PT Treatment/Exercise - 02/09/18 1150      Neuro Re-ed    Neuro Re-ed Details   NMR in tall kneeling, half kneeling, modified quadruped and semi stand positons to increased activation and motor control in forced use of RLE.  While in tall kneeling, performed squats x 10 reps with decreasing support from therapist.  Note good equal WB during this task with min cues for increased R hip extension.  Transitioned to L half kneeling maintaining position with decreasing UE support and external support from PT with cues and facilitation for posterior weight shift back onto RLE and prevent trunk from increased L rotation and forward trunk lean.  Progressed to having pt in L half kneel tapping L heel to further engage RLE in stance control.  Tactile and verbal cues to assist.  Transitioned back to tall kneeling with LLE abduction x 10 reps with tactile cues at pelvis for decreased lateral motion during task.  Modified quadruped on elbows on lowered Kaye bench performing LUE extension x 10 reps, then  LLE only lifts x 10 reps followed by modified plank with 2-3 sec holds x 5 reps.  PT assisting at L axilla during all modified quadruped positions to avoid pain and malalignment in R shoulder.  Sit<>stand from lowered Kaye bench x 5 reps with LUE on L LE to decrease L lateral weight shift and encourage more WB through RLE.  Placed mirror in front of pt for improved visual feedback on posture.  Progressed to sit<>stand with 2" step under LLE to further increase R lateral weight shift.  Pt tolerated well.  Performed mid range squat exercises performing lateral weight shift for more automatic RLE activation.  Ended session with semi seated/standing position on corner of elevated mat with LLE on cone to achieve co-contraction of R  hamstring and quad.  Pt tolerated better today vs previous attempt.  Note intermittent carryover between tasks, however pt still needs cues for improved R LE loading during stance during gait.              PT Education - 02/09/18 1510    Education provided  Yes    Education Details  purpose of NMR tasks, provided handout for aquatic therapy per OT    Person(s) Educated  Patient   brother   Methods  Explanation    Comprehension  Verbalized understanding       PT Short Term Goals - 01/28/18 1212      PT SHORT TERM GOAL #1   Title  The patient will ambulate on level community surfaces with SPC mod indep for improved mobility.    Baseline  Using [facility] in community for limited surfaces.    Time  4    Period  Weeks    Status  Achieved      PT SHORT TERM GOAL #2   Title  The patient will improve gait speed from 0.85 ft/sec to > or equal to 1.2 ft/sec to demonstrate transition from "household ambulator" to "limited community ambulator" classification of gait.    Baseline  1.17 ft/sec on 01/28/18    Time  4    Period  Weeks    Status  Partially Met      PT SHORT TERM GOAL #3   Title  The patient will improve Berg balance score from 42/56 to > or equal to 45/56 to demo dec'ing risk for falls.    Baseline  Berg=45/56 on 01/28/18    Time  4    Period  Weeks    Status  Achieved      PT SHORT TERM GOAL #4   Title  The patient will ambulate x 600 ft nonstop to demo improved endurance for functional gait.    Baseline  Patient able to ambulate nonstop x 600 ft mod indep on level surfaces with SPc.     Time  4    Period  Weeks    Status  Achieved        PT Long Term Goals - 12/29/17 2012      PT LONG TERM GOAL #1   Title  The patient will perform updated HEP with intermittent assist for post d/c progression of exercise.    Time  8    Period  Weeks    Status  New    Target Date  02/27/18      PT LONG TERM GOAL #2   Title  The patient will move floor<>stand with mod indep using L  UE support on surface due to h/o falls.  Time  8    Period  Weeks    Status  Revised    Target Date  02/27/18      PT LONG TERM GOAL #3   Title  The patient will improve gait speed from 0.85 ft/sec to > or equal to 1.5 ft/sec to demo dec'ing risk for falls.    Baseline  0.98 ft.sec on 12/22/2017    Time  8    Period  Weeks    Status  Revised    Target Date  02/27/18      PT LONG TERM GOAL #4   Title  The patient will improve Berg score from 42/56 (on 12/22/2017) up to 48/56 to demo dec'ing risk for falls.    Time  8    Period  Weeks    Status  Revised    Target Date  02/27/18      PT LONG TERM GOAL #5   Title  The patient will negotiate 12 steps without a handrail with step to pattern mod indep to access home entries.    Baseline  one handrail mod indep with step to pattern 12/22/2017    Time  8    Period  Weeks    Status  Revised    Target Date  02/27/18            Plan - 02/09/18 1510    Clinical Impression Statement  Skilled session focused on NMR in varying forced use positions for improved activation of RLE (esp proximal musculature).  Note carryover between tasks with loading of RLE, however she still requires tactile and verbal cues for improved R lateral weight shift and WB during gait.     PT Treatment/Interventions  ADLs/Self Care Home Management;Gait training;Stair training;Functional mobility training;Therapeutic activities;Therapeutic exercise;Balance training;Neuromuscular re-education;Patient/family education;Orthotic Fit/Training;Electrical Stimulation;Manual techniques;DME Instruction    PT Next Visit Plan  Treadmill to work on gait speed (push at faster speeds as tolerable), high level balance, Tall kneeling for right hip activation, R LE strengthening. work on knee flexion during gait.  Patient may be interested in community wellness/ clarify with family if they have access to gym.      Consulted and Agree with Plan of Care  Patient       Patient will  benefit from skilled therapeutic intervention in order to improve the following deficits and impairments:  Abnormal gait, Impaired sensation, Pain, Postural dysfunction, Impaired tone, Decreased mobility, Decreased coordination, Decreased activity tolerance, Decreased endurance, Decreased strength, Difficulty walking, Decreased balance  Visit Diagnosis: Hemiplegia and hemiparesis following cerebral infarction affecting right dominant side (HCC)  Unsteadiness on feet  Other symptoms and signs involving the nervous system  Muscle weakness (generalized)  Other abnormalities of gait and mobility     Problem List Patient Active Problem List   Diagnosis Date Noted  . Fall 09/17/2017  . Hemiparesis of right dominant side as late effect of cerebral infarction ([place]) 09/17/2017  . Shoulder subluxation, right, initial encounter 09/17/2017  . Cardioembolic stroke ([place]) 56/81/2751  . Weakness 09/17/2017  . Aphasia 09/17/2017  . 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 Cardiomyopathy ([place]) 09/17/2017  . H/O ischemic left MCA stroke 09/17/2017  . Cardiac LV ejection fraction 21-30%   . Graves disease   . Plaque psoriasis   . Neuropathy 05/19/2017    Cameron Sprang, PT, MPT [facility] [address] [place], [place], [address] Phone: [phone]   Fax:  [phone] 02/09/18, 3:13 PM  Name: Shyler Hamill MRN: [id number] Date of Birth: 1961-02-18

## 2018-02-09 NOTE — Therapy (Signed)
[facility] [address] [place], [place], [address] Phone: [phone]   Fax:  [phone]  Occupational Therapy Treatment  Patient Details  Name: Margaret Ortiz MRN: [id number] Date of Birth: 1961-02-02 Referring Provider: Howard Pouch, DO   Encounter Date: 02/09/2018  OT End of Session - 02/09/18 1229    Visit Number  31    Number of Visits  36    Date for OT Re-Evaluation  02/20/18    Authorization Type  BC/BS    OT Start Time  1101    OT Stop Time  1145    OT Time Calculation (min)  44 min       Past Medical History:  Diagnosis Date  . Aphasia 09/17/2017  . 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  . Cardiac LV ejection fraction 21-30%   . Cardioembolic stroke ([place]) 60/63/0160   Right hemiparesis  . Cardiomyopathy ([place]) 04/2017  . Dysphagia 05/19/2017   Last known diet upgraded to regular diet with thin liquids on 06/27/2017, no records available  . 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 Fall 09/17/2017  . Graves disease   . H/O ischemic left MCA stroke 09/17/2017  . Hemiparesis of right dominant side as late effect of cerebral infarction ([place]) 09/17/2017  . Hyperlipidemia   . Hypertension   . Neuropathy 05/19/2017   s/p stroke  . Plaque psoriasis   . Shoulder subluxation, right, initial encounter 09/17/2017  . Weakness 09/17/2017    Past Surgical History:  Procedure Laterality Date  . NO PAST SURGERIES      Vitals:   02/09/18 1118  BP: 110/67  Pulse: 66    Subjective Assessment - 02/09/18 1108    Subjective   Yes I want to do that pool    Patient is accompained by:  Family member   brother drove pt but did not stay   Pertinent History  Lt MCA CVA 05/19/2017, Graves dz    Patient Stated Goals  use my Rt arm    Currently in Pain?  No/denies                   OT  Treatments/Exercises (OP) - 02/09/18 0001      ADLs   ADL Comments  Pt to start aquatic therapy this week - reviewed all pool info with pt and then provided info along with appt times in writing to pt to give to family ( family was asked to attend session today by primary therapist to review given pt's language deficts however brother dropped pt off and did not stay).  Pt verbalized that she would give to her family.  PT to attempt to give to familly after her session if family is present to pick pt up. Reviewed focus of aquatic therapy and discused planning for 4 sessions at this point.       Neurological Re-education Exercises   Other Exercises 1  Neuro re ed in standing with RUE in weight bearing and using forced paradaigm to encourage pt to weight shift onto RLE and RUE while completing reaching activity across the body with LUE. Initially had pt on elbows then just on R elbow then with RUE in full extension - by end of session pt able to use RUE/RLE for stablization while picking up LLE and hold balance with min facilitation.  Pt needs mod cues/facilitation to translate this  into functional ambulation without a device.              OT Education - 02/09/18 1224    Education provided  Yes    Education Details  aquatic therapy    Person(s) Educated  Patient    Methods  Explanation;Handout    Comprehension  Verbalized understanding   as able given language deficits      OT Short Term Goals - 02/09/18 1225      OT SHORT TERM GOAL #1   Title  Pt/family independent with HEP for RUE - 11/27/17    Status  Achieved      OT SHORT TERM GOAL #2   Title  Independent with splint wear and care for Rt hand prn    Status  Achieved      OT SHORT TERM GOAL #3   Title  Pt/family to verbalize understanding with A/E and task modifications to increase indpendence with ADLS/IADLS (for bathing, tying shoes, cutting food, simple meal prep)    Status  Achieved      OT SHORT TERM GOAL #4   Title  Pt  to perform bathing with only min assist and A/E prn while seated     Status  Achieved      OT SHORT TERM GOAL #5   Title  Pt to make sandwich w/ only supervision/cues prn    Status  Achieved      OT SHORT TERM GOAL #6   Title  Pt to demo 25 degrees shoulder flexion in prep for low level reaching with gross finger flexion - goals due 01/21/2018    Time  4    Period  Weeks    Status  On-going      OT SHORT TERM GOAL #7   Title  Pt to perform environmental scanning at 90% accuracy during ambulation    Time  4    Period  Weeks    Status  On-going        OT Long Term Goals - 02/09/18 1225      OT LONG TERM GOAL #1   Title  Pt to perform shower transfers at mod I level w/ DME prn and no LOB - goals due 02/18/2018    Time  8    Period  Weeks    Status  Not Met   Pt requires supervision to min assist for safety.      OT LONG TERM GOAL #2   Title  Pt to perform simple meal prep at sup level using A/E and task modifications prn    Time  8    Period  Weeks    Status  On-going   Pt now agrees to work on this again     [place] #3   Title  Pt to perform light house management tasks at sup level including: laundry, washing dishes, cleaning, making bed    Time  8    Period  Weeks    Status  Achieved   per pt report     OT LONG TERM GOAL #4   Title  Pt to demo 25% finger extension in prep for releasing objects Rt hand     Time  8    Period  Weeks    Status  Not Met   no active finger extension     OT LONG TERM GOAL #5   Title  (LTG #5 moved to STG for renewal period)  OT LONG TERM GOAL #6   Title  Pt to use Rt hand as stabalizer 25% of the time    Time  8    Period  Weeks    Status  On-going      OT LONG TERM GOAL #7   Title  Pt/family to verbalize understanding with neccessary accommodations, A/E, etc that would be needed if pt is to return to living alone    Time  8    Period  Weeks    Status  Deferred   Pt will not be safe to return to living alone at  this time           Plan - 02/09/18 1226    Clinical Impression Statement  Pt with slow progress toward goals.  Pt requires signficant repetition for motor learning due to cognitive deficits, sensory impairment, apraxia, language deficits, perceuptual deficits and motor deficits.  Pt does demonstrate improvment during session.     Occupational Profile and client history currently impacting functional performance  no significant PMH but current impairments extensive    Occupational performance deficits (Please refer to evaluation for details):  ADL's;IADL's;Work;Leisure;Social Participation    Rehab Potential  Fair    Current Impairments/barriers affecting progress:  severity of deficits    OT Frequency  2x / week    OT Duration  8 weeks    OT Treatment/Interventions  Self-care/ADL training;Moist Heat;DME and/or AE instruction;Splinting;Therapeutic activities;Psychosocial skills training;Aquatic Therapy;Cognitive remediation/compensation;Therapeutic exercise;Coping strategies training;Neuromuscular education;Functional Mobility Training;Passive range of motion;Visual/perceptual remediation/compensation;Electrical Stimulation;Manual Therapy;Patient/family education    Plan  monitor BP for upcoming aquatic therapy, discuss A.T. schedule w/ pt's family to make sure Sept appointments ok, also continue progress towards remaining goals, NMR for trunk, balance, functional mobility and proximal control of LUE    Clinical Decision Making  Several treatment options, min-mod task modification necessary    Consulted and Agree with Plan of Care  Patient       Patient will benefit from skilled therapeutic intervention in order to improve the following deficits and impairments:  Decreased coordination, Decreased range of motion, Difficulty walking, Improper body mechanics, Decreased endurance, Decreased safety awareness, Impaired sensation, Improper spinal/pelvic alignment, Impaired tone, Decreased knowledge  of precautions, Decreased knowledge of use of DME, Decreased balance, Impaired UE functional use, Pain, Decreased cognition, Decreased mobility, Decreased strength, Impaired perceived functional ability, Impaired vision/preception  Visit Diagnosis: Hemiplegia and hemiparesis following cerebral infarction affecting right dominant side (HCC)  Unsteadiness on feet  Other symptoms and signs involving the nervous system  Muscle weakness (generalized)  Other disturbances of skin sensation  Chronic right shoulder pain  Visuospatial deficit  Other lack of coordination  Other symptoms and signs involving cognitive functions following cerebral infarction    Problem List Patient Active Problem List   Diagnosis Date Noted  . Fall 09/17/2017  . Hemiparesis of right dominant side as late effect of cerebral infarction ([place]) 09/17/2017  . Shoulder subluxation, right, initial encounter 09/17/2017  . Cardioembolic stroke ([place]) 18/59/0931  . Weakness 09/17/2017  . Aphasia 09/17/2017  . 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 Cardiomyopathy ([place]) 09/17/2017  . H/O ischemic left MCA stroke 09/17/2017  . Cardiac LV ejection fraction 21-30%   . Graves disease   . Plaque psoriasis   . Neuropathy 05/19/2017    Quay Burow, OTR/L 02/09/2018, 12:45 PM  [facility] [address] [place], [place], [address] Phone: [phone]   Fax:  [phone]  Name: Margaret Ortiz MRN: [id number] Date of Birth: 1961-04-20

## 2018-02-11 ENCOUNTER — Encounter: Payer: Self-pay | Admitting: Occupational Therapy

## 2018-02-11 ENCOUNTER — Ambulatory Visit: Payer: BLUE CROSS/BLUE SHIELD | Admitting: Occupational Therapy

## 2018-02-11 ENCOUNTER — Encounter: Payer: BLUE CROSS/BLUE SHIELD | Admitting: Occupational Therapy

## 2018-02-11 DIAGNOSIS — I69318 Other symptoms and signs involving cognitive functions following cerebral infarction: Secondary | ICD-10-CM

## 2018-02-11 DIAGNOSIS — R278 Other lack of coordination: Secondary | ICD-10-CM

## 2018-02-11 DIAGNOSIS — I69351 Hemiplegia and hemiparesis following cerebral infarction affecting right dominant side: Secondary | ICD-10-CM

## 2018-02-11 DIAGNOSIS — M6281 Muscle weakness (generalized): Secondary | ICD-10-CM

## 2018-02-11 DIAGNOSIS — R41842 Visuospatial deficit: Secondary | ICD-10-CM

## 2018-02-11 DIAGNOSIS — R2681 Unsteadiness on feet: Secondary | ICD-10-CM

## 2018-02-11 DIAGNOSIS — M25511 Pain in right shoulder: Secondary | ICD-10-CM

## 2018-02-11 DIAGNOSIS — R29818 Other symptoms and signs involving the nervous system: Secondary | ICD-10-CM

## 2018-02-11 DIAGNOSIS — R208 Other disturbances of skin sensation: Secondary | ICD-10-CM

## 2018-02-11 DIAGNOSIS — G8929 Other chronic pain: Secondary | ICD-10-CM

## 2018-02-11 NOTE — Therapy (Signed)
[facility] [address] [place], [place], [address] Phone: [phone]   Fax:  [phone]  Occupational Therapy Treatment  Patient Details  Name: Margaret Ortiz MRN: [id number] Date of Birth: June 02, 1961 Referring Provider: Howard Pouch, DO   Encounter Date: 02/11/2018  OT End of Session - 02/11/18 2112    Visit Number  32    Number of Visits  36    Date for OT Re-Evaluation  02/20/18    Authorization Type  BC/BS    OT Start Time  1514    OT Stop Time  1603    OT Time Calculation (min)  49 min    Activity Tolerance  Patient tolerated treatment well       Past Medical History:  Diagnosis Date  . Aphasia 09/17/2017  . 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  . Cardiac LV ejection fraction 21-30%   . Cardioembolic stroke ([place]) 90/24/0973   Right hemiparesis  . Cardiomyopathy ([place]) 04/2017  . Dysphagia 05/19/2017   Last known diet upgraded to regular diet with thin liquids on 06/27/2017, no records available  . 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 Fall 09/17/2017  . Graves disease   . H/O ischemic left MCA stroke 09/17/2017  . Hemiparesis of right dominant side as late effect of cerebral infarction ([place]) 09/17/2017  . Hyperlipidemia   . Hypertension   . Neuropathy 05/19/2017   s/p stroke  . Plaque psoriasis   . Shoulder subluxation, right, initial encounter 09/17/2017  . Weakness 09/17/2017    Past Surgical History:  Procedure Laterality Date  . NO PAST SURGERIES      There were no vitals filed for this visit.  Subjective Assessment - 02/11/18 2108    Subjective   Oh ok I see    Patient is accompained by:  Family member   sister, brother   Pertinent History  Lt MCA CVA 05/19/2017, Graves dz    Patient Stated Goals  use my Rt arm    Currently in Pain?  No/denies      Treatment:  Pt seen for  first session of aquatic therapy.  Treatment took place in water 2-4 feet deep depending upon activity.  Pt initially with difficulty focusing and following directions however this improved as session progressed and pt became more comfortable in the water.  Pt entered the pool using 1 UE support via steps doing one step at time with max facilitation to engage and utilize RLE.  Significant focus of treatment today on postural alignment, weight bearing on RLE, lateral and anterior weight shifts with thoracic extension and finding midline. Pt with severe sensory and perceptual deficits resulting in poor ability to transfer weight onto RLE without significant facilitation.  Pt also with poor postural alignment when forced use requires her to weight shift to R. With significant repetition and practice however pt by end of session much more willing to initiate and then activate into R weight shift. Pt also over utilizes LUE for balance, trunk control and functional mobility.  Focus of session also included heavy emphasis toward decreasing use of LUE for stabilization in order to assist pt into midline and more active core for standing and stepping.  Addressed forward and backward walking along pool wall with focus on reaching with R foot vs hiking and retracting of R hip and using hip flexion to bring RLE through. Pt  benefits from breaking down each phase with significant repetition before moving onto to next movement. Addressed core strength facing pool wall with focus on decreasing trunk rotation and retraction while addressing hip ab/adduction and hip extension (max facilitation for trunk and RLE movement).  Addressed facilitation of isolated movement of RUE using flotation device - this is difficult for patient due to poor sensation and decreased resistance on floating device.  Pt did tolerate full ROM into horizontal abduction moving into full weight shift onto RLE while leading with RUE for side reach. Treatment in  water allowed pt to feel much safer to weight shift onto RLE as water eliminated much of the fear of falling. Buoyancy also provided much needed stability for pt to feel safe.   Upon exiting the pool, pt able to use RLE as power leg (leading with RLE) one step at time with 1 UE support and mod facilitation to come over RLE with cues to "power up " on RLE. On land translated increasing weight on RLE by increasing step length of LLE . Pt able to generalize and was also seen later in the locker room with carry over for increased weight bearing on RLE, improved balance, improved functional mobility and improved postural alignment without cueing.                        OT Short Term Goals - 02/11/18 2109      OT SHORT TERM GOAL #1   Title  Pt/family independent with HEP for RUE - 11/27/17    Status  Achieved      OT SHORT TERM GOAL #2   Title  Independent with splint wear and care for Rt hand prn    Status  Achieved      OT SHORT TERM GOAL #3   Title  Pt/family to verbalize understanding with A/E and task modifications to increase indpendence with ADLS/IADLS (for bathing, tying shoes, cutting food, simple meal prep)    Status  Achieved      OT SHORT TERM GOAL #4   Title  Pt to perform bathing with only min assist and A/E prn while seated     Status  Achieved      OT SHORT TERM GOAL #5   Title  Pt to make sandwich w/ only supervision/cues prn    Status  Achieved      OT SHORT TERM GOAL #6   Title  Pt to demo 25 degrees shoulder flexion in prep for low level reaching with gross finger flexion - goals due 01/21/2018    Time  4    Period  Weeks    Status  On-going      OT SHORT TERM GOAL #7   Title  Pt to perform environmental scanning at 90% accuracy during ambulation    Time  4    Period  Weeks    Status  On-going        OT Long Term Goals - 02/11/18 2109      OT LONG TERM GOAL #1   Title  Pt to perform shower transfers at mod I level w/ DME prn and no LOB - goals  due 02/18/2018    Time  8    Period  Weeks    Status  Not Met   Pt requires supervision to min assist for safety.      OT LONG TERM GOAL #2   Title  Pt to perform simple meal prep  at sup level using A/E and task modifications prn    Time  8    Period  Weeks    Status  On-going   Pt now agrees to work on this again     English #3   Title  Pt to perform light house management tasks at sup level including: laundry, washing dishes, cleaning, making bed    Time  8    Period  Weeks    Status  Achieved   per pt report     OT LONG TERM GOAL #4   Title  Pt to demo 25% finger extension in prep for releasing objects Rt hand     Time  8    Period  Weeks    Status  Not Met   no active finger extension     OT LONG TERM GOAL #5   Title  (LTG #5 moved to STG for renewal period)      OT LONG TERM GOAL #6   Title  Pt to use Rt hand as stabalizer 25% of the time    Time  8    Period  Weeks    Status  On-going      OT LONG TERM GOAL #7   Title  Pt/family to verbalize understanding with neccessary accommodations, A/E, etc that would be needed if pt is to return to living alone    Time  8    Period  Weeks    Status  Deferred   Pt will not be safe to return to living alone at this time           Plan - 02/11/18 2110    Clinical Impression Statement  Pt today with first session of aquatic therapy. Pt initially had signficant difficulty following directions however as session progressed pt appeared more at ease in water and better able to follow therapeutic interventions.     Occupational Profile and client history currently impacting functional performance  no significant PMH but current impairments extensive    Occupational performance deficits (Please refer to evaluation for details):  ADL's;IADL's;Work;Leisure;Social Participation    Rehab Potential  Fair    Current Impairments/barriers affecting progress:  severity of deficits    OT Frequency  2x / week    OT Duration  8  weeks    OT Treatment/Interventions  Self-care/ADL training;Moist Heat;DME and/or AE instruction;Splinting;Therapeutic activities;Psychosocial skills training;Aquatic Therapy;Cognitive remediation/compensation;Therapeutic exercise;Coping strategies training;Neuromuscular education;Functional Mobility Training;Passive range of motion;Visual/perceptual remediation/compensation;Electrical Stimulation;Manual Therapy;Patient/family education    Plan  continue progress towards remaining goals, aquatic therapy for NMR for trunk, balance, postural alignment ,functional mobility and proximal control of LUE    Consulted and Agree with Plan of Care  Patient    Family Member Consulted  sister, brother in law       Patient will benefit from skilled therapeutic intervention in order to improve the following deficits and impairments:  Decreased coordination, Decreased range of motion, Difficulty walking, Improper body mechanics, Decreased endurance, Decreased safety awareness, Impaired sensation, Improper spinal/pelvic alignment, Impaired tone, Decreased knowledge of precautions, Decreased knowledge of use of DME, Decreased balance, Impaired UE functional use, Pain, Decreased cognition, Decreased mobility, Decreased strength, Impaired perceived functional ability, Impaired vision/preception  Visit Diagnosis: Hemiplegia and hemiparesis following cerebral infarction affecting right dominant side (HCC)  Unsteadiness on feet  Other symptoms and signs involving the nervous system  Muscle weakness (generalized)  Other disturbances of skin sensation  Chronic right shoulder pain  Visuospatial deficit  Other lack of coordination  Other symptoms and signs involving cognitive functions following cerebral infarction    Problem List Patient Active Problem List   Diagnosis Date Noted  . Fall 09/17/2017  . Hemiparesis of right dominant side as late effect of cerebral infarction ([place]) 09/17/2017  . Shoulder  subluxation, right, initial encounter 09/17/2017  . Cardioembolic stroke ([place]) 97/35/3299  . Weakness 09/17/2017  . Aphasia 09/17/2017  . 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 Cardiomyopathy ([place]) 09/17/2017  . H/O ischemic left MCA stroke 09/17/2017  . Cardiac LV ejection fraction 21-30%   . Graves disease   . Plaque psoriasis   . Neuropathy 05/19/2017    Quay Burow, OTR/L 02/11/2018, 9:14 PM  [facility] [address] [place], [place], [address] Phone: [phone]   Fax:  [phone]  Name: Shaneil Yazdi MRN: [id number] Date of Birth: 26-Feb-1961

## 2018-02-12 ENCOUNTER — Encounter: Payer: Self-pay | Admitting: Rehabilitation

## 2018-02-12 ENCOUNTER — Ambulatory Visit: Payer: BLUE CROSS/BLUE SHIELD | Admitting: Rehabilitation

## 2018-02-12 ENCOUNTER — Encounter: Payer: BLUE CROSS/BLUE SHIELD | Admitting: Occupational Therapy

## 2018-02-12 ENCOUNTER — Ambulatory Visit: Payer: BLUE CROSS/BLUE SHIELD | Admitting: Speech Pathology

## 2018-02-12 DIAGNOSIS — I69351 Hemiplegia and hemiparesis following cerebral infarction affecting right dominant side: Secondary | ICD-10-CM | POA: Diagnosis not present

## 2018-02-12 DIAGNOSIS — R2681 Unsteadiness on feet: Secondary | ICD-10-CM

## 2018-02-12 DIAGNOSIS — M6281 Muscle weakness (generalized): Secondary | ICD-10-CM

## 2018-02-12 DIAGNOSIS — R29818 Other symptoms and signs involving the nervous system: Secondary | ICD-10-CM

## 2018-02-12 DIAGNOSIS — R41841 Cognitive communication deficit: Secondary | ICD-10-CM

## 2018-02-12 DIAGNOSIS — R4701 Aphasia: Secondary | ICD-10-CM

## 2018-02-12 NOTE — Therapy (Signed)
[facility] [address] [place], [place], [address] Phone: [phone]   Fax:  [phone]  Physical Therapy Treatment  Patient Details  Name: Margaret Ortiz MRN: [id number] Date of Birth: 09/29/1960 Referring Provider: Howard Pouch, DO   Encounter Date: 02/12/2018  PT End of Session - 02/12/18 1301    Visit Number  26    Number of Visits  33    Date for PT Re-Evaluation  02/27/18    Authorization Type  BCBS     PT Start Time  1102    PT Stop Time  1150    PT Time Calculation (min)  48 min    Equipment Utilized During Treatment  --   min guard to S   Activity Tolerance  Patient tolerated treatment well    Behavior During Therapy  [facility] for tasks assessed/performed       Past Medical History:  Diagnosis Date  . Aphasia 09/17/2017  . 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  . Cardiac LV ejection fraction 21-30%   . Cardioembolic stroke ([place]) 58/85/0277   Right hemiparesis  . Cardiomyopathy ([place]) 04/2017  . Dysphagia 05/19/2017   Last known diet upgraded to regular diet with thin liquids on 06/27/2017, no records available  . 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 Fall 09/17/2017  . Graves disease   . H/O ischemic left MCA stroke 09/17/2017  . Hemiparesis of right dominant side as late effect of cerebral infarction ([facility]) 09/17/2017  . Hyperlipidemia   . Hypertension   . Neuropathy 05/19/2017   s/p stroke  . Plaque psoriasis   . Shoulder subluxation, right, initial encounter 09/17/2017  . Weakness 09/17/2017    Past Surgical History:  Procedure Laterality Date  . NO PAST SURGERIES      There were no vitals filed for this visit.  Subjective Assessment - 02/12/18 1246    Subjective  Pt reports having good first pool therapy session with OT.     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      Currently in Pain?  No/denies                       [facility] Adult PT Treatment/Exercise - 02/12/18 1247      Ambulation/Gait   Ambulation/Gait  Yes    Ambulation/Gait Assistance  5: Supervision    Ambulation/Gait Assistance Details  Continue to address gait with use of R AFO and SPC for improved quality and gait speed with more reciprocal pattern and two point gait pattern.  Provided cues for upright posture, improved B step length, letting R hip drop slightly at end of R stance phase to provide gentle more passive R knee flex during swing phase of gait rather than her performing R hip hike and circumduction.  Pt making excellent progress with these aspects of gait during session.     Ambulation Distance (Feet)  300 Feet    Assistive device  Straight cane    Gait Pattern  Decreased stance time - right;Decreased step length - left;Decreased stride length;Decreased hip/knee flexion - right;Decreased dorsiflexion - right;Decreased weight shift to right;Right circumduction;Right hip hike;Lateral hip instability;Poor foot clearance - right    Ambulation Surface  Level;Indoor  Neuro Re-ed    Neuro Re-ed Details   NMR in forced use positions to improve RLE stance motor control and activation to carryover to gait.  Performed R tall kneel (R knee on mat, L foot on floor) having pt elevate onto R LE, elevating LLE from floor to reach target.  Performed several reps, however note that she would "hang" onto target with LUE, therefore received assist from another PT to provide support and cues to "push therapist away" with LUE to further engage RLE and increase forward movement over RLE.  Pt did very well.  Progressed to RLE on 4" step facing counter top with target sticky note on top cabinets.  Cues for pt to elevate onto RLE, allow LLE to leave floor and  hold position x 5 secs.  Performed x 10 reps with tactile cues from PT to note rest LLE on RLE when performing task.  Pt would like to continue to address L knee flex and hamstring activation, therefore assisted pt into prone position (with pillows under chest and head to prevent pain in RUE.  Pt is unable to activate hamstrings at all in this position, therefore had her get into L SL with RLE on powder board with use of mirror for improved feedback of motion.  Pt is able to activate hamstrings in this position, but only with hip flex.  She was then also able to activate R hamstrings in seated position (seated all the way back so that feet are off ground) performing LAQ followed by knee flex.  She initially needed heavy support to decrease trunk/pelvic motion, however she did improve greatly with task during session.  Ended session with gait without device with pt facing PT and PT supporting RUE only.  Note marked improvement in gait quality and PT able to increase gait speed.  Still note mild R hip hike with difficulty dropping R hip for R knee flex during swing, but overall is so much better than previous session without cane.              PT Education - 02/12/18 1301    Education provided  Yes    Education Details  purpose of NMR and gait without cane, but to continue use of cane at home.     Person(s) Educated  Patient    Methods  Explanation    Comprehension  Verbalized understanding       PT Short Term Goals - 01/28/18 1212      PT SHORT TERM GOAL #1   Title  The patient will ambulate on level community surfaces with SPC mod indep for improved mobility.    Baseline  Using [facility] in community for limited surfaces.    Time  4    Period  Weeks    Status  Achieved      PT SHORT TERM GOAL #2   Title  The patient will improve gait speed from 0.85 ft/sec to > or equal to 1.2 ft/sec to demonstrate transition from "household ambulator" to "limited community ambulator" classification of gait.     Baseline  1.17 ft/sec on 01/28/18    Time  4    Period  Weeks    Status  Partially Met      PT SHORT TERM GOAL #3   Title  The patient will improve Berg balance score from 42/56 to > or equal to 45/56 to demo dec'ing risk for falls.    Baseline  Berg=45/56 on 01/28/18    Time  4    Period  Weeks    Status  Achieved      PT SHORT TERM GOAL #4   Title  The patient will ambulate x 600 ft nonstop to demo improved endurance for functional gait.    Baseline  Patient able to ambulate nonstop x 600 ft mod indep on level surfaces with SPc.     Time  4    Period  Weeks    Status  Achieved        PT Long Term Goals - 12/29/17 2012      PT LONG TERM GOAL #1   Title  The patient will perform updated HEP with intermittent assist for post d/c progression of exercise.    Time  8    Period  Weeks    Status  New    Target Date  02/27/18      PT LONG TERM GOAL #2   Title  The patient will move floor<>stand with mod indep using L UE support on surface due to h/o falls.    Time  8    Period  Weeks    Status  Revised    Target Date  02/27/18      PT LONG TERM GOAL #3   Title  The patient will improve gait speed from 0.85 ft/sec to > or equal to 1.5 ft/sec to demo dec'ing risk for falls.    Baseline  0.98 ft.sec on 12/22/2017    Time  8    Period  Weeks    Status  Revised    Target Date  02/27/18      PT LONG TERM GOAL #4   Title  The patient will improve Berg score from 42/56 (on 12/22/2017) up to 48/56 to demo dec'ing risk for falls.    Time  8    Period  Weeks    Status  Revised    Target Date  02/27/18      PT LONG TERM GOAL #5   Title  The patient will negotiate 12 steps without a handrail with step to pattern mod indep to access home entries.    Baseline  one handrail mod indep with step to pattern 12/22/2017    Time  8    Period  Weeks    Status  Revised    Target Date  02/27/18            Plan - 02/12/18 1301    Clinical Impression Statement  Skilled session focused on  continued NMR tasks to force use of RLE in stance control along with isolation of R hamstring.  Note that she is able to activate hamstrings, but only with R hip flex.  Did note that her gait demos marked improvement during session with both improved quality and increased gait speed.     PT Treatment/Interventions  ADLs/Self Care Home Management;Gait training;Stair training;Functional mobility training;Therapeutic activities;Therapeutic exercise;Balance training;Neuromuscular re-education;Patient/family education;Orthotic Fit/Training;Electrical Stimulation;Manual techniques;DME Instruction    PT Next Visit Plan  Treadmill to work on gait speed (push at faster speeds as tolerable), high level balance, Tall kneeling for right hip activation, R LE strengthening. work on knee flexion during gait.  Patient may be interested in community wellness/ clarify with family if they have access to gym.      Consulted and Agree with Plan of Care  Patient       Patient will benefit from skilled therapeutic intervention in order to improve the following deficits and impairments:  Abnormal gait, Impaired sensation, Pain, Postural dysfunction, Impaired tone, Decreased mobility, Decreased coordination, Decreased activity tolerance, Decreased endurance, Decreased strength, Difficulty walking, Decreased balance  Visit Diagnosis: Hemiplegia and hemiparesis following cerebral infarction affecting right dominant side (HCC)  Unsteadiness on feet  Other symptoms and signs involving the nervous system  Muscle weakness (generalized)     Problem List Patient Active Problem List   Diagnosis Date Noted  . Fall 09/17/2017  . Hemiparesis of right dominant side as late effect of cerebral infarction ([place]) 09/17/2017  . Shoulder subluxation, right, initial encounter 09/17/2017  . Cardioembolic stroke ([place]) 86/38/1771  . Weakness 09/17/2017  . Aphasia 09/17/2017  . 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 Cardiomyopathy ([place]) 09/17/2017  . H/O ischemic left MCA stroke 09/17/2017  . Cardiac LV ejection fraction 21-30%   . Graves disease   . Plaque psoriasis   . Neuropathy 05/19/2017   Cameron Sprang, PT, MPT [facility] [address] [place], [place], [address] Phone: [phone]   Fax:  [phone] 02/12/18, 1:09 PM  Name: Margaret Ortiz MRN: [id number] Date of Birth: 10-Sep-1960

## 2018-02-12 NOTE — Therapy (Signed)
[facility] [address] [place], [place], [address] Phone: [phone]   Fax:  [phone]  Physical Therapy Treatment  Patient Details  Name: Margaret Ortiz MRN: [id number] Date of Birth: 09/29/1960 Referring Provider: Howard Pouch, DO   Encounter Date: 02/12/2018  PT End of Session - 02/12/18 1301    Visit Number  26    Number of Visits  33    Date for PT Re-Evaluation  02/27/18    Authorization Type  BCBS     PT Start Time  1102    PT Stop Time  1150    PT Time Calculation (min)  48 min    Equipment Utilized During Treatment  --   min guard to S   Activity Tolerance  Patient tolerated treatment well    Behavior During Therapy  [facility] for tasks assessed/performed       Past Medical History:  Diagnosis Date  . Aphasia 09/17/2017  . 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  . Cardiac LV ejection fraction 21-30%   . Cardioembolic stroke ([place]) 58/85/0277   Right hemiparesis  . Cardiomyopathy ([place]) 04/2017  . Dysphagia 05/19/2017   Last known diet upgraded to regular diet with thin liquids on 06/27/2017, no records available  . 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 Fall 09/17/2017  . Graves disease   . H/O ischemic left MCA stroke 09/17/2017  . Hemiparesis of right dominant side as late effect of cerebral infarction ([place]) 09/17/2017  . Hyperlipidemia   . Hypertension   . Neuropathy 05/19/2017   s/p stroke  . Plaque psoriasis   . Shoulder subluxation, right, initial encounter 09/17/2017  . Weakness 09/17/2017    Past Surgical History:  Procedure Laterality Date  . NO PAST SURGERIES      There were no vitals filed for this visit.  Subjective Assessment - 02/12/18 1246    Subjective  Pt reports having good first pool therapy session with OT.     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      Currently in Pain?  No/denies                       [facility] Adult PT Treatment/Exercise - 02/12/18 1247      Ambulation/Gait   Ambulation/Gait  Yes    Ambulation/Gait Assistance  5: Supervision    Ambulation/Gait Assistance Details  Continue to address gait with use of R AFO and SPC for improved quality and gait speed with more reciprocal pattern and two point gait pattern.  Provided cues for upright posture, improved B step length, letting R hip drop slightly at end of R stance phase to provide gentle more passive R knee flex during swing phase of gait rather than her performing R hip hike and circumduction.  Pt making excellent progress with these aspects of gait during session.     Ambulation Distance (Feet)  300 Feet    Assistive device  Straight cane    Gait Pattern  Decreased stance time - right;Decreased step length - left;Decreased stride length;Decreased hip/knee flexion - right;Decreased dorsiflexion - right;Decreased weight shift to right;Right circumduction;Right hip hike;Lateral hip instability;Poor foot clearance - right    Ambulation Surface  Level;Indoor  Neuro Re-ed    Neuro Re-ed Details   NMR in forced use positions to improve RLE stance motor control and activation to carryover to gait.  Performed R tall kneel (R knee on mat, L foot on floor) having pt elevate onto R LE, elevating LLE from floor to reach target.  Performed several reps, however note that she would "hang" onto target with LUE, therefore received assist from another PT to provide support and cues to "push therapist away" with LUE to further engage RLE and increase forward movement over RLE.  Pt did very well.  Progressed to RLE on 4" step facing counter top with target sticky note on top cabinets.  Cues for pt to elevate onto RLE, allow LLE to leave floor and  hold position x 5 secs.  Performed x 10 reps with tactile cues from PT to note rest LLE on RLE when performing task.  Pt would like to continue to address L knee flex and hamstring activation, therefore assisted pt into prone position (with pillows under chest and head to prevent pain in RUE.  Pt is unable to activate hamstrings at all in this position, therefore had her get into L SL with RLE on powder board with use of mirror for improved feedback of motion.  Pt is able to activate hamstrings in this position, but only with hip flex.  She was then also able to activate R hamstrings in seated position (seated all the way back so that feet are off ground) performing LAQ followed by knee flex.  She initially needed heavy support to decrease trunk/pelvic motion, however she did improve greatly with task during session.  Ended session with gait without device with pt facing PT and PT supporting RUE only.  Note marked improvement in gait quality and PT able to increase gait speed.  Still note mild R hip hike with difficulty dropping R hip for R knee flex during swing, but overall is so much better than previous session without cane.              PT Education - 02/12/18 1301    Education provided  Yes    Education Details  purpose of NMR and gait without cane, but to continue use of cane at home.     Person(s) Educated  Patient    Methods  Explanation    Comprehension  Verbalized understanding       PT Short Term Goals - 01/28/18 1212      PT SHORT TERM GOAL #1   Title  The patient will ambulate on level community surfaces with SPC mod indep for improved mobility.    Baseline  Using [facility] in community for limited surfaces.    Time  4    Period  Weeks    Status  Achieved      PT SHORT TERM GOAL #2   Title  The patient will improve gait speed from 0.85 ft/sec to > or equal to 1.2 ft/sec to demonstrate transition from "household ambulator" to "limited community ambulator" classification of gait.     Baseline  1.17 ft/sec on 01/28/18    Time  4    Period  Weeks    Status  Partially Met      PT SHORT TERM GOAL #3   Title  The patient will improve Berg balance score from 42/56 to > or equal to 45/56 to demo dec'ing risk for falls.    Baseline  Berg=45/56 on 01/28/18    Time  4    Period  Weeks    Status  Achieved      PT SHORT TERM GOAL #4   Title  The patient will ambulate x 600 ft nonstop to demo improved endurance for functional gait.    Baseline  Patient able to ambulate nonstop x 600 ft mod indep on level surfaces with SPc.     Time  4    Period  Weeks    Status  Achieved        PT Long Term Goals - 12/29/17 2012      PT LONG TERM GOAL #1   Title  The patient will perform updated HEP with intermittent assist for post d/c progression of exercise.    Time  8    Period  Weeks    Status  New    Target Date  02/27/18      PT LONG TERM GOAL #2   Title  The patient will move floor<>stand with mod indep using L UE support on surface due to h/o falls.    Time  8    Period  Weeks    Status  Revised    Target Date  02/27/18      PT LONG TERM GOAL #3   Title  The patient will improve gait speed from 0.85 ft/sec to > or equal to 1.5 ft/sec to demo dec'ing risk for falls.    Baseline  0.98 ft.sec on 12/22/2017    Time  8    Period  Weeks    Status  Revised    Target Date  02/27/18      PT LONG TERM GOAL #4   Title  The patient will improve Berg score from 42/56 (on 12/22/2017) up to 48/56 to demo dec'ing risk for falls.    Time  8    Period  Weeks    Status  Revised    Target Date  02/27/18      PT LONG TERM GOAL #5   Title  The patient will negotiate 12 steps without a handrail with step to pattern mod indep to access home entries.    Baseline  one handrail mod indep with step to pattern 12/22/2017    Time  8    Period  Weeks    Status  Revised    Target Date  02/27/18            Plan - 02/12/18 1301    Clinical Impression Statement  Skilled session focused on  continued NMR tasks to force use of RLE in stance control along with isolation of R hamstring.  Note that she is able to activate hamstrings, but only with R hip flex.  Did note that her gait demos marked improvement during session with both improved quality and increased gait speed.     PT Treatment/Interventions  ADLs/Self Care Home Management;Gait training;Stair training;Functional mobility training;Therapeutic activities;Therapeutic exercise;Balance training;Neuromuscular re-education;Patient/family education;Orthotic Fit/Training;Electrical Stimulation;Manual techniques;DME Instruction    PT Next Visit Plan  Treadmill to work on gait speed (push at faster speeds as tolerable), high level balance, Tall kneeling for right hip activation, R LE strengthening. work on knee flexion during gait.  Patient may be interested in community wellness/ clarify with family if they have access to gym.      Consulted and Agree with Plan of Care  Patient       Patient will benefit from skilled therapeutic intervention in order to improve the following deficits and impairments:  Abnormal gait, Impaired sensation, Pain, Postural dysfunction, Impaired tone, Decreased mobility, Decreased coordination, Decreased activity tolerance, Decreased endurance, Decreased strength, Difficulty walking, Decreased balance  Visit Diagnosis: No diagnosis found.     Problem List Patient Active Problem List   Diagnosis Date Noted  . Fall 09/17/2017  . Hemiparesis of right dominant side as late effect of cerebral infarction ([place]) 09/17/2017  . Shoulder subluxation, right, initial encounter 09/17/2017  . Cardioembolic stroke ([place]) 09/38/1829  . Weakness 09/17/2017  . Aphasia 09/17/2017  . 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 Cardiomyopathy ([place]) 09/17/2017  . H/O ischemic left MCA stroke 09/17/2017  . Cardiac LV ejection fraction 21-30%   . Graves disease   . Plaque psoriasis   . Neuropathy 05/19/2017     Denice Bors 02/12/2018, 1:04 PM  [facility] [address] [place], [place], [address] Phone: [phone]   Fax:  [phone]  Name: Redell Bhandari MRN: [id number] Date of Birth: 1961/05/08

## 2018-02-12 NOTE — Therapy (Signed)
[facility] [address] [place], [place], [address] Phone: [phone]   Fax:  [phone]  Speech Language Pathology Treatment  Patient Details  Name: Margaret Ortiz MRN: [id number] Date of Birth: 01-09-1961 Referring Provider: Ma Hillock, DO   Encounter Date: 02/12/2018  End of Session - 02/12/18 1317    Visit Number  25    Number of Visits  33    Date for SLP Re-Evaluation  02/20/18    Authorization Type  BCBS    SLP Start Time  1025    SLP Stop Time   1102    SLP Time Calculation (min)  37 min    Activity Tolerance  Patient tolerated treatment well       Past Medical History:  Diagnosis Date  . Aphasia 09/17/2017  . 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  . Cardiac LV ejection fraction 21-30%   . Cardioembolic stroke ([place]) 97/98/9211   Right hemiparesis  . Cardiomyopathy ([place]) 04/2017  . Dysphagia 05/19/2017   Last known diet upgraded to regular diet with thin liquids on 06/27/2017, no records available  . 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 Fall 09/17/2017  . Graves disease   . H/O ischemic left MCA stroke 09/17/2017  . Hemiparesis of right dominant side as late effect of cerebral infarction ([place]) 09/17/2017  . Hyperlipidemia   . Hypertension   . Neuropathy 05/19/2017   s/p stroke  . Plaque psoriasis   . Shoulder subluxation, right, initial encounter 09/17/2017  . Weakness 09/17/2017    Past Surgical History:  Procedure Laterality Date  . NO PAST SURGERIES      There were no vitals filed for this visit.  Subjective Assessment - 02/12/18 1233    Subjective  "My sister said my speech is not good."    Currently in Pain?  No/denies            ADULT SLP TREATMENT - 02/12/18 1025      General Information   Behavior/Cognition  Alert;Cooperative;Requires cueing      Treatment  Provided   Treatment provided  Cognitive-Linquistic      Pain Assessment   Pain Assessment  No/denies pain      Cognitive-Linquistic Treatment   Treatment focused on  Aphasia    Skilled Treatment  Simple-mod complex conversation was functional today with compensations (slow rate), pt required occasional encouragement from SLP to elaborate. SLP continued education with pt today re: her speech, language and cognitive deficits. Pt with decreased awareness of deficits, stating she needs to get back to driving. SLP told pt that given her cognitive deficits driving is not recommended at this time. Pt agrees that she would not be able to work given her current deficits. She continues to ask about when her speech will return to "normal" and states that if she practices speech movements she will be able to "talk fast again." SLP again educated that given length of time post-CVA and multifactorial components of her communication problem (motor and language), pt is likely to have persisting deficits. SLP suggested that neuropsychology may be beneficial and provided information. SLP again provided information re: community resources for aphasia/stroke, vocational rehab,  speech therapy at [facility].      Assessment / Recommendations / Plan   Plan  Continue with current plan of care      Progression Toward Goals   Progression  toward goals  Progressing toward goals       SLP Education - 02/12/18 1316    Education provided  Yes    Education Details  neuropsychology, aphasia/stroke resources, vocational rehab    [employer]) Educated  Patient    Methods  Explanation;Handout    Comprehension  Verbalized understanding       SLP Short Term Goals - 02/09/18 1315      SLP SHORT TERM GOAL #6   Title  Pt will reduce safety risks by communicating personal needs, requests for help, directives, using AAC if necessary, with occasional min A over 3 sessions.    Status  Not Met      SLP SHORT TERM GOAL #7   Title  Pt  will increase communication effectiveness by answering questions in 90% of opportunities, using AAC if needed, over 3 sessions independently.    Status  Not Met      SLP SHORT TERM GOAL #8   Title  Pt will use multimodal communication/AAC when necessary in 5 minutes simple conversation with occasional min A over 3 sessions    Status  Not Met       SLP Long Term Goals - 02/12/18 1235      SLP LONG TERM GOAL #1   Title  Pt will participate functionally in 15 minutes simple-mod complex conversation with multimodal communication over 3 sessions.    Baseline  12/15/17, 02/09/18, 02/12/18    Time  2    Period  Weeks    Status  Achieved      SLP LONG TERM GOAL #2   Title  Pt will demo understanding of 10 minutes simple-mod complex conversation with compensations over 3 sessions    Baseline  02-05-18, 02/09/18, 02/12/18    Time  2    Period  Weeks    Status  Achieved      SLP LONG TERM GOAL #3   Title  Pt will demo error awareness by attempting correction or by nonverbal response to errors with 90% success    Time  2    Period  Weeks    Status  On-going      SLP LONG TERM GOAL #4   Title  Pt will use multimodal communication/AAC when necessary in 10 minutes simple-mod complex conversation with rare min A over 6 sessions    Time  1    Period  Weeks    Status  On-going      SLP LONG TERM GOAL #5   Title  pt will demo alternating attention between two simple cognitive-linguistic tasks for 85% accuracy with self correction/double checking answers (aphasia compensations allowed) over 3 sessions    Time  2    Period  Weeks    Status  On-going       Plan - 02/12/18 1317    Clinical Impression Statement  Pt continues to present with 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 See "skilled intervention" for details. Paraphasias and neologisms continue with faster rate of speech, however with slow rate conversation was functional for simple-mod complex subject matter.  Despite repeated education re: prognosis pt continues to perseverate on returning to "normal speech." Neuropsych consult may be beneficial. Pt progressing toward LTG and anticipate d/c in next 2-3 sessions.Continue skilled ST to maximize functional communication for independence and QOL.     Speech Therapy Frequency  2x / week    Treatment/Interventions  Multimodal communcation approach;Compensatory strategies;Language facilitation;Compensatory techniques;Cueing hierarchy;Internal/external aids;Functional tasks;SLP instruction and feedback;Patient/family  education    Potential to Achieve Goals  Fair    Potential Considerations  Severity of impairments;Ability to learn/carryover information    SLP Home Exercise Plan  provided    Consulted and Agree with Plan of Care  Patient       Patient will benefit from skilled therapeutic intervention in order to improve the following deficits and impairments:   Aphasia  Cognitive communication deficit    Problem List Patient Active Problem List   Diagnosis Date Noted  . Fall 09/17/2017  . Hemiparesis of right dominant side as late effect of cerebral infarction ([place]) 09/17/2017  . Shoulder subluxation, right, initial encounter 09/17/2017  . Cardioembolic stroke ([place]) 44/08/4740  . Weakness 09/17/2017  . Aphasia 09/17/2017  . 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 Cardiomyopathy ([place]) 09/17/2017  . H/O ischemic left MCA stroke 09/17/2017  . Cardiac LV ejection fraction 21-30%   . Graves disease   . Plaque psoriasis   . Neuropathy 05/19/2017   Deneise Lever, [place], [place] 02/12/2018, 1:19 PM  [facility] [address] [place], [place], [address] Phone: [phone]   Fax:  [phone]   Name: Margaret Ortiz MRN: [id number] Date of Birth: 1961/04/24

## 2018-02-12 NOTE — Patient Instructions (Signed)
[employer] [place] [email or web address]   Neuropsychologist: Dr. Ilean Skill [email or web address]   [email or web address]://www.foster.info/ :[facility] ap (on your desktop or download Talk[facility] therapy for iPad)   Log in with your username : [email or web address]  and [place]: [email or web address]  [phone] [place] group Mondays 4-5 [email or web address] They are talking about starting a group in [place], but right now [facility] is the closest one.  Vocational rehabilitation:  [email or web address]

## 2018-02-16 ENCOUNTER — Ambulatory Visit: Payer: BLUE CROSS/BLUE SHIELD | Admitting: Speech Pathology

## 2018-02-16 ENCOUNTER — Ambulatory Visit: Payer: BLUE CROSS/BLUE SHIELD | Admitting: Rehabilitation

## 2018-02-16 ENCOUNTER — Ambulatory Visit: Payer: BLUE CROSS/BLUE SHIELD | Admitting: Occupational Therapy

## 2018-02-16 ENCOUNTER — Encounter: Payer: Self-pay | Admitting: Rehabilitation

## 2018-02-16 DIAGNOSIS — M6281 Muscle weakness (generalized): Secondary | ICD-10-CM

## 2018-02-16 DIAGNOSIS — I69351 Hemiplegia and hemiparesis following cerebral infarction affecting right dominant side: Secondary | ICD-10-CM | POA: Diagnosis not present

## 2018-02-16 DIAGNOSIS — R29818 Other symptoms and signs involving the nervous system: Secondary | ICD-10-CM

## 2018-02-16 DIAGNOSIS — R2681 Unsteadiness on feet: Secondary | ICD-10-CM

## 2018-02-16 DIAGNOSIS — R41841 Cognitive communication deficit: Secondary | ICD-10-CM

## 2018-02-16 DIAGNOSIS — R2689 Other abnormalities of gait and mobility: Secondary | ICD-10-CM

## 2018-02-16 DIAGNOSIS — R4701 Aphasia: Secondary | ICD-10-CM

## 2018-02-16 NOTE — Therapy (Signed)
[facility] [address] [place], [place], [address] Phone: [phone]   Fax:  [phone]  Speech Language Pathology Treatment  Patient Details  Name: Margaret Ortiz MRN: [id number] Date of Birth: 1960/08/22 Referring Provider: Ma Hillock, DO   Encounter Date: 02/16/2018  End of Session - 02/16/18 1348    Visit Number  26    Number of Visits  33    Date for SLP Re-Evaluation  02/20/18    Authorization Type  BCBS    SLP Start Time  8185    SLP Stop Time   1100    SLP Time Calculation (min)  45 min    Activity Tolerance  Patient tolerated treatment well       Past Medical History:  Diagnosis Date  . Aphasia 09/17/2017  . 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  . Cardiac LV ejection fraction 21-30%   . Cardioembolic stroke ([place]) 63/14/9702   Right hemiparesis  . Cardiomyopathy ([place]) 04/2017  . Dysphagia 05/19/2017   Last known diet upgraded to regular diet with thin liquids on 06/27/2017, no records available  . 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 Fall 09/17/2017  . Graves disease   . H/O ischemic left MCA stroke 09/17/2017  . Hemiparesis of right dominant side as late effect of cerebral infarction ([place]) 09/17/2017  . Hyperlipidemia   . Hypertension   . Neuropathy 05/19/2017   s/p stroke  . Plaque psoriasis   . Shoulder subluxation, right, initial encounter 09/17/2017  . Weakness 09/17/2017    Past Surgical History:  Procedure Laterality Date  . NO PAST SURGERIES      There were no vitals filed for this visit.         ADULT SLP TREATMENT - 02/16/18 1015      General Information   Behavior/Cognition  Alert;Cooperative;Requires cueing      Treatment Provided   Treatment provided  Cognitive-Linquistic      Pain Assessment   Pain Assessment  No/denies pain      Cognitive-Linquistic Treatment   Treatment focused on  Aphasia    Skilled Treatment  SLP worked with pt on error awareness in conversation and reading. 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 In conversation pt made attempts at self-correction of errors approximately 90% of the time, with occasional non-verbal cues. Pt less aware of paraphasic errors in reading. SLP used written feedback to demonstrate errors (for example: "corrective", "collective" vs. connective). SLP also used these examples to explain the difference between language problem (aphasia) vs motor speech problem (apraxia). Pt has upcoming appointment with her cardiologist; with rare min A she used drawing and writing to augment verbalizations when telling SLP about her heart problem.       Assessment / Recommendations / Plan   Plan  Continue with current plan of care      Progression Toward Goals   Progression toward goals  Progressing toward goals       SLP Education - 02/16/18 1347    Education provided  Yes    Education Details  [employer] news, aphasia vs apraxia    Person(s) Educated  Patient    Methods  Explanation;Demonstration    Comprehension  Verbalized understanding       SLP Short Term Goals - 02/09/18 1315      SLP SHORT TERM GOAL #6  Title  Pt will reduce safety risks by communicating personal needs, requests for help, directives, using AAC if necessary, with occasional min A over 3 sessions.    Status  Not Met      SLP SHORT TERM GOAL #7   Title  Pt will increase communication effectiveness by answering questions in 90% of opportunities, using AAC if needed, over 3 sessions independently.    Status  Not Met      SLP SHORT TERM GOAL #8   Title  Pt will use multimodal communication/AAC when necessary in 5 minutes simple conversation with occasional min A over 3 sessions    Status  Not Met       SLP Long Term Goals - 02/16/18 1337      SLP LONG TERM GOAL #1   Title  Pt  will participate functionally in 15 minutes simple-mod complex conversation with multimodal communication over 3 sessions.    Status  Achieved      SLP LONG TERM GOAL #2   Title  Pt will demo understanding of 10 minutes simple-mod complex conversation with compensations over 3 sessions    Status  Achieved      SLP LONG TERM GOAL #3   Title  Pt will demo error awareness by attempting correction or by nonverbal response to errors with 90% success    Time  1    Period  Weeks    Status  On-going       Plan - 02/16/18 1349    Clinical Impression Statement  Pt continues to present with 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 See "skilled intervention" for details. Paraphasias and neologisms continue with faster rate of speech, however with slow rate conversation was functional for simple-mod complex subject matter. Despite repeated education re: prognosis pt continues to perseverate on returning to "normal speech." Neuropsych consult may be beneficial. Pt progressing toward LTG and anticipate d/c in next 1-2 sessions. Continue skilled ST to maximize functional communication for independence and QOL.     Speech Therapy Frequency  2x / week    Treatment/Interventions  Multimodal communcation approach;Compensatory strategies;Language facilitation;Compensatory techniques;Cueing hierarchy;Internal/external aids;Functional tasks;SLP instruction and feedback;Patient/family education    Potential to Achieve Goals  Fair    Potential Considerations  Severity of impairments;Ability to learn/carryover information    SLP Home Exercise Plan  provided    Consulted and Agree with Plan of Care  Patient       Patient will benefit from skilled therapeutic intervention in order to improve the following deficits and impairments:   Aphasia  Cognitive communication deficit    Problem List Patient Active Problem List   Diagnosis Date Noted  . Fall 09/17/2017  . Hemiparesis of right  dominant side as late effect of cerebral infarction ([place]) 09/17/2017  . Shoulder subluxation, right, initial encounter 09/17/2017  . Cardioembolic stroke ([place]) 92/06/69  . Weakness 09/17/2017  . Aphasia 09/17/2017  . 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 Cardiomyopathy ([place]) 09/17/2017  . H/O ischemic left MCA stroke 09/17/2017  . Cardiac LV ejection fraction 21-30%   . Graves disease   . Plaque psoriasis   . Neuropathy 05/19/2017   Deneise Lever, [place], [place] 02/16/2018, 1:50 PM  [facility] [address] [place], [place], [address] Phone: [phone]   Fax:  [phone]   Name: Destin Kittler MRN: [id number] Date of Birth: [birth year]

## 2018-02-16 NOTE — Patient Instructions (Signed)
Intelect NMES parameters and  instructions:   Put on for 15 minutes, 2x/day. (Can build up to 30 minutes, 2x/day after a couple weeks).   Mode: Synchronous Ramp: 2 sec On time: 12 sec Off time: 10 sec Rate: 50 Hz Width: 250 ms Total time: 15 minutes (eventually can change to 30 minutes) Intensity: just past 2  ALWAYS make sure it is off and screen is blank before removing electrodes ALWAYS make sure it is "on time" before adjusting intensity

## 2018-02-16 NOTE — Therapy (Signed)
[facility] [address] [place], [place], [address] Phone: [phone]   Fax:  [phone]  Physical Therapy Treatment  Patient Details  Name: Margaret Ortiz MRN: [id number] Date of Birth: 31-Mar-1961 Referring Provider: Howard Pouch, DO   Encounter Date: 02/16/2018  PT End of Session - 02/16/18 1501    Visit Number  27    Number of Visits  33    Date for PT Re-Evaluation  02/27/18    Authorization Type  BCBS     PT Start Time  1149    PT Stop Time  1232    PT Time Calculation (min)  43 min    Equipment Utilized During Treatment  --   min guard to S   Activity Tolerance  Patient tolerated treatment well    Behavior During Therapy  [facility] for tasks assessed/performed       Past Medical History:  Diagnosis Date  . Aphasia 09/17/2017  . 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  . Cardiac LV ejection fraction 21-30%   . Cardioembolic stroke ([place]) 92/44/6286   Right hemiparesis  . Cardiomyopathy ([place]) 04/2017  . Dysphagia 05/19/2017   Last known diet upgraded to regular diet with thin liquids on 06/27/2017, no records available  . 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 Fall 09/17/2017  . Graves disease   . H/O ischemic left MCA stroke 09/17/2017  . Hemiparesis of right dominant side as late effect of cerebral infarction ([place]) 09/17/2017  . Hyperlipidemia   . Hypertension   . Neuropathy 05/19/2017   s/p stroke  . Plaque psoriasis   . Shoulder subluxation, right, initial encounter 09/17/2017  . Weakness 09/17/2017    Past Surgical History:  Procedure Laterality Date  . NO PAST SURGERIES      There were no vitals filed for this visit.  Subjective Assessment - 02/16/18 1154    Subjective  Pt reports no changes since last visit.     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                         [facility] Adult PT Treatment/Exercise - 02/16/18 1150      Ambulation/Gait   Ambulation/Gait  Yes    Ambulation/Gait Assistance  4: Min guard;4: Min assist    Ambulation/Gait Assistance Details  Utilized body weight support system over treadmill during session to improve gait speed along with improved quality of gait.  Pt able to use BUE for support during gait, but no support given from body weight support, only donned for safety in case of LOB.  Had pt ambulate at speed of 1.6 mph x 2 mins then 3 mins.  Pt very fatigued but did very well.  Provided tactile cues/facilitation at R hip to prevent hip hike during swing phase of gait and also to remind for R hip depression to allow more automatic R knee flex.  Also facilitated forward translation of R hip over LE in stance phase of gait.      Assistive device  Body weight support system    Gait Pattern  Decreased stance time - right;Decreased step length - left;Decreased  stride length;Decreased hip/knee flexion - right;Decreased dorsiflexion - right;Decreased weight shift to right;Right circumduction;Right hip hike;Lateral hip instability;Poor foot clearance - right    Ambulation Surface  Level;Indoor      Neuro Re-ed    Neuro Re-ed Details   NMR prior to gait on treadmill in order to continue to emphasize R LE forced use and WB to carryover to improved gait.  In L half kneeling performing ant/post weight shift with emphasis on increased R  hip extension during posterior weight shift x 10 reps.  Progressed from having LUE support>LUE support on PTs shoulders>LUE support flat hand to PTs flat hand during task.   While in L half kneel, had pt lift/lower L heel to increase R LE motor control in stance.  Also worked on pt getting up from floor  utilizing RUE to power up through.  Also had pt perform step ups powering up with RLE x 10 reps with decreasing LUE support with cues for posture and slow controlled movement.  Pt able to do very well.  Ended session with gait without device following treadmill training with PT supporing RUE only x 115' with cues for continued forward R hip protraction and increased gait speed.               PT Education - 02/16/18 1500    Education provided  Yes    Education Details  purpose of treadmill training, adding visits to end of POC with goal of 4 week renewal    Person(s) Educated  Patient    Methods  Explanation    Comprehension  Verbalized understanding       PT Short Term Goals - 01/28/18 1212      PT SHORT TERM GOAL #1   Title  The patient will ambulate on level community surfaces with SPC mod indep for improved mobility.    Baseline  Using [facility] in community for limited surfaces.    Time  4    Period  Weeks    Status  Achieved      PT SHORT TERM GOAL #2   Title  The patient will improve gait speed from 0.85 ft/sec to > or equal to 1.2 ft/sec to demonstrate transition from "household ambulator" to "limited community ambulator" classification of gait.    Baseline  1.17 ft/sec on 01/28/18    Time  4    Period  Weeks    Status  Partially Met      PT SHORT TERM GOAL #3   Title  The patient will improve Berg balance score from 42/56 to > or equal to 45/56 to demo dec'ing risk for falls.    Baseline  Berg=45/56 on 01/28/18    Time  4    Period  Weeks    Status  Achieved      PT SHORT TERM GOAL #4   Title  The patient will ambulate x 600 ft nonstop to demo improved endurance for functional gait.    Baseline  Patient able to ambulate nonstop x 600 ft mod indep on level surfaces with SPc.     Time  4    Period  Weeks    Status  Achieved        PT Long Term Goals - 12/29/17 2012      PT LONG TERM GOAL #1   Title  The patient will perform updated HEP with intermittent assist for  post d/c progression of exercise.    Time  8  Period  Weeks    Status  New    Target Date  02/27/18      PT LONG TERM GOAL #2   Title  The patient will move floor<>stand with mod indep using L UE support on surface due to h/o falls.    Time  8    Period  Weeks    Status  Revised    Target Date  02/27/18      PT LONG TERM GOAL #3   Title  The patient will improve gait speed from 0.85 ft/sec to > or equal to 1.5 ft/sec to demo dec'ing risk for falls.    Baseline  0.98 ft.sec on 12/22/2017    Time  8    Period  Weeks    Status  Revised    Target Date  02/27/18      PT LONG TERM GOAL #4   Title  The patient will improve Berg score from 42/56 (on 12/22/2017) up to 48/56 to demo dec'ing risk for falls.    Time  8    Period  Weeks    Status  Revised    Target Date  02/27/18      PT LONG TERM GOAL #5   Title  The patient will negotiate 12 steps without a handrail with step to pattern mod indep to access home entries.    Baseline  one handrail mod indep with step to pattern 12/22/2017    Time  8    Period  Weeks    Status  Revised    Target Date  02/27/18            Plan - 02/16/18 1501    Clinical Impression Statement  Skilled session focused on continued NMR tasks to force use of RLE in stance with improved R hip protraction.  Initiated gait training with use of body weight support system over treadmill (for safety only no weight lifted) to carryover from NMR tasks along with improving gait speed.  Pt tolerated very well but with moderate fatigue.      PT Treatment/Interventions  ADLs/Self Care Home Management;Gait training;Stair training;Functional mobility training;Therapeutic activities;Therapeutic exercise;Balance training;Neuromuscular re-education;Patient/family education;Orthotic Fit/Training;Electrical Stimulation;Manual techniques;DME Instruction    PT Next Visit Plan  Treadmill to work on gait speed (push at faster speeds as tolerable), high level balance, Tall kneeling  for right hip activation, R LE strengthening. work on knee flexion during gait.  Patient may be interested in community wellness/ clarify with family if they have access to gym.      Consulted and Agree with Plan of Care  Patient       Patient will benefit from skilled therapeutic intervention in order to improve the following deficits and impairments:  Abnormal gait, Impaired sensation, Pain, Postural dysfunction, Impaired tone, Decreased mobility, Decreased coordination, Decreased activity tolerance, Decreased endurance, Decreased strength, Difficulty walking, Decreased balance  Visit Diagnosis: Hemiplegia and hemiparesis following cerebral infarction affecting right dominant side (HCC)  Unsteadiness on feet  Other symptoms and signs involving the nervous system  Muscle weakness (generalized)  Other abnormalities of gait and mobility     Problem List Patient Active Problem List   Diagnosis Date Noted  . Fall 09/17/2017  . Hemiparesis of right dominant side as late effect of cerebral infarction ([facility]) 09/17/2017  . Shoulder subluxation, right, initial encounter 09/17/2017  . Cardioembolic stroke ([facility]) 09/17/2017  . Weakness 09/17/2017  . Aphasia 09/17/2017  . 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   Cardiomyopathy (HCC) 09/17/2017  . H/O ischemic left MCA stroke 09/17/2017  . Cardiac LV ejection fraction 21-30%   . Graves disease   . Plaque psoriasis   . Neuropathy 05/19/2017    Emily Parcell, PT, MPT [facility] [address] [place], , [address] Phone: [phone]   Fax:  [phone] 02/16/18, 3:04 PM  Name: Margaret Ortiz MRN: [id number] Date of Birth: 01/21/1961   

## 2018-02-16 NOTE — Therapy (Addendum)
[facility] [address] [place], [place], [address] Phone: [phone]   Fax:  [phone]  Occupational Therapy Treatment  Patient Details  Name: Margaret Ortiz MRN: [id number] Date of Birth: 1961-01-21 Referring Provider: Howard Pouch, DO   Encounter Date: 02/16/2018  OT End of Session - 02/16/18 1158    Visit Number  33    Number of Visits  42    Date for OT Re-Evaluation  04/24/18    Authorization Type  BC/BS    OT Start Time  1100    OT Stop Time  1145    OT Time Calculation (min)  45 min    Activity Tolerance  Patient tolerated treatment well    Behavior During Therapy  [facility] for tasks assessed/performed       Past Medical History:  Diagnosis Date  . Aphasia 09/17/2017  . 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  . Cardiac LV ejection fraction 21-30%   . Cardioembolic stroke ([place]) 01/00/7121   Right hemiparesis  . Cardiomyopathy ([place]) 04/2017  . Dysphagia 05/19/2017   Last known diet upgraded to regular diet with thin liquids on 06/27/2017, no records available  . 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 Fall 09/17/2017  . Graves disease   . H/O ischemic left MCA stroke 09/17/2017  . Hemiparesis of right dominant side as late effect of cerebral infarction ([place]) 09/17/2017  . Hyperlipidemia   . Hypertension   . Neuropathy 05/19/2017   s/p stroke  . Plaque psoriasis   . Shoulder subluxation, right, initial encounter 09/17/2017  . Weakness 09/17/2017    Past Surgical History:  Procedure Laterality Date  . NO PAST SURGERIES      There were no vitals filed for this visit.  Subjective Assessment - 02/16/18 1157    Subjective   How often will I use it? (re: NMES)     Patient is accompained by:  Family member   brother-n-law   Pertinent History  Lt MCA CVA 05/19/2017, Graves dz    Patient Stated Goals   use my Rt arm    Currently in Pain?  No/denies       TREATMENT:  Pt/family education on NMES: See below. Pt also received 15 min. of estim while simultaneously performing education and reviewing goals and progress to date for renewal period.                     OT Education - 02/16/18 1144    Education provided  Yes    Education Details  How to use home NMES unit, properly place electrodes, adjust settings/parameters, etc    Person(s) Educated  Patient;Other (comment)   brother-n-law   Methods  Explanation;Demonstration;Handout    Comprehension  Verbalized understanding       OT Short Term Goals - 02/16/18 1203      OT SHORT TERM GOAL #1   Title  Pt/family independent with HEP for RUE - 11/27/17    Status  Achieved      OT SHORT TERM GOAL #2   Title  Independent with splint wear and care for Rt hand prn    Status  Achieved      OT SHORT TERM GOAL #3   Title  Pt/family to verbalize understanding with A/E and task modifications to increase indpendence with ADLS/IADLS (for bathing, tying shoes, cutting food, simple meal prep)  Status  Achieved      OT SHORT TERM GOAL #4   Title  Pt to perform bathing with only min assist and A/E prn while seated     Status  Achieved      OT SHORT TERM GOAL #5   Title  Pt to make sandwich w/ only supervision/cues prn    Status  Achieved      Additional Short Term Goals   Additional Short Term Goals  Yes      OT SHORT TERM GOAL #6   Title  Pt to demo 25 degrees shoulder flexion in prep for low level reaching with gross finger flexion - goals due 01/21/2018    Time  4    Period  Weeks    Status  Not Met      OT SHORT TERM GOAL #7   Title  Pt to perform environmental scanning at 90% accuracy during ambulation    Time  4    Period  Weeks    Status  On-going      OT SHORT TERM GOAL #8   Title  Pt to demo initiation of proximal movement of UE, scapula and shoulder girdle stability for trunk stabilization with functional  ambulation - 03/24/18    Time  4    Period  Weeks    Status  New        OT Long Term Goals - 02/16/18 1203      OT LONG TERM GOAL #1   Title  Pt to perform shower transfers at mod I level w/ DME prn and no LOB - goals due 02/18/2018    Time  8    Period  Weeks    Status  Not Met   Pt requires supervision to min assist for safety.      OT LONG TERM GOAL #2   Title  Pt to perform simple meal prep at sup level using A/E and task modifications prn    Time  8    Period  Weeks    Status  Achieved      OT LONG TERM GOAL #3   Title  Pt to perform light house management tasks at sup level including: laundry, washing dishes, cleaning, making bed    Time  8    Period  Weeks    Status  Achieved   per pt report     OT LONG TERM GOAL #4   Title  Pt to demo 25% finger extension in prep for releasing objects Rt hand     Time  8    Period  Weeks    Status  Not Met   no active finger extension     OT LONG TERM GOAL #5   Title  (LTG #5 moved to STG for renewal period)      Long Term Additional Goals   Additional Long Term Goals  Yes      OT LONG TERM GOAL #6   Title  Pt to use Rt hand as stabalizer 25% of the time    Time  8    Period  Weeks    Status  Not Met      OT LONG TERM GOAL #7   Title  Pt/family to verbalize understanding with neccessary accommodations, A/E, etc that would be needed if pt is to return to living alone    Time  8    Period  Weeks    Status  Deferred   Pt  will not be safe to return to living alone at this time     OT [place] #8   Title  Pt/family independent with home use of NMES for Rt wrist and finger extension - due 02/26/18    Time  1    Period  Weeks    Status  On-going      OT LONG TERM GOAL  #9   Baseline  Pt independent with water based HEP w/ assist from family prn - due by 04/24/18    Time  8    Period  Weeks    Status  New      OT LONG TERM GOAL  #10   TITLE  Pt will improve postural control/alignment for functional mobility to  decrease risk of falls during ADLS/IADL activities - due by 04/24/18    Time  8    Period  Weeks    Status  New            Plan - 02/16/18 1210    Clinical Impression Statement  Pt to be renewed today for dates 02/22/18 - 04/24/18. Pt to work on STG #8, and LTG's #8, #9, and #10 for renewal period. See updates towards remaining goals.     Occupational Profile and client history currently impacting functional performance  no significant PMH but current impairments extensive    Occupational performance deficits (Please refer to evaluation for details):  ADL's;IADL's;Work;Leisure;Social Participation    Rehab Potential  Fair    Current Impairments/barriers affecting progress:  severity of deficits    OT Frequency  --   2x/wk for 1 week, then 1x/wk for up to 7 more weeks for aquatic therapy (anticipate only 4 more sessions)   OT Treatment/Interventions  Aquatic Therapy;Self-care/ADL training;DME and/or AE instruction;Therapeutic activities;Neuromuscular education;Patient/family education;Electrical Stimulation    Plan  Aquatic therapy next session, then traditional  therapy on 02/26/18 to address STG #7 and LTG #8. (Remaining OT appointments for aquatic therapy)   Consulted and Agree with Plan of Care  Patient    Family Member Consulted   brother in law       Patient will benefit from skilled therapeutic intervention in order to improve the following deficits and impairments:  Decreased coordination, Decreased range of motion, Difficulty walking, Improper body mechanics, Decreased endurance, Decreased safety awareness, Impaired sensation, Improper spinal/pelvic alignment, Impaired tone, Decreased knowledge of precautions, Decreased knowledge of use of DME, Decreased balance, Impaired UE functional use, Pain, Decreased cognition, Decreased mobility, Decreased strength, Impaired perceived functional ability, Impaired vision/preception  Visit Diagnosis: Hemiplegia and hemiparesis following cerebral  infarction affecting right dominant side ([place]) - Plan: Ot plan of care cert/re-cert  Unsteadiness on feet - Plan: Ot plan of care cert/re-cert  Other symptoms and signs involving the nervous system - Plan: Ot plan of care cert/re-cert  Muscle weakness (generalized) - Plan: Ot plan of care cert/re-cert    Problem List Patient Active Problem List   Diagnosis Date Noted  . Fall 09/17/2017  . Hemiparesis of right dominant side as late effect of cerebral infarction ([place]) 09/17/2017  . Shoulder subluxation, right, initial encounter 09/17/2017  . Cardioembolic stroke ([place]) 23/55/7322  . Weakness 09/17/2017  . Aphasia 09/17/2017  . 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 Cardiomyopathy ([place]) 09/17/2017  . H/O ischemic left MCA stroke 09/17/2017  . Cardiac LV ejection fraction 21-30%   . Graves disease   . Plaque psoriasis   . Neuropathy 05/19/2017    Emmit Pomfret,  Barkley Boards, OTR/L 02/16/2018, 2:31 PM  [facility] [address] [place], [place], [address] Phone: [phone]   Fax:  [phone]  Name: Margaret Ortiz MRN: [id number] Date of Birth: 1960/06/26

## 2018-02-16 NOTE — Progress Notes (Signed)
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  Chronic systolic heart failure  Wt Readings from Last 3 Encounters:  02/17/18 123 lb 6.4 oz (56 kg)  12/18/17 125 lb (56.7 kg)  11/04/17 120 lb (54.4 kg)       History of Present Illness: Margaret Ortiz is a 57 y.o. female  Who had a stroke in November 2018.  Per the records: "Patient was found down May 19, 2017 was found to be secondary to a left MCA stroke. Was felt to likely have been cardioembolic from undiagnosed cardiomyopathy since her ejection fraction was found to be 15-20%. Repeat echo on November 30, 2018showed improved ejection fraction of 20-25%. She suffered from dysphasia required an NG tubeintially. SHe was admitted to acute rehab December 4 to July 01, 2017. She was upgraded to a regular diet with thin liquids via MBS on June 27, 2017. Apparently ambulated with assistance at acute rehab. She had been following in the outpatient setting withvisits including cardiology and neurology appointments. Otherwise she stayed at a [facility] ([facility]) until 09/11/2017 when she was discharged and moved to [facility] Cardiomyopathy, unspecified type (HCC)/Cardiac LV ejection fraction 21-30%"  Recent echocardiogram done in [place] in February 2019 showed ejection fraction 15 to 20% with mild to moderate mitral regurgitation.  Repeat echo in 01/2018 showed: Impressions:  - Severe global reduction in LV systolic function; moderate   diastolic dysfunction; mild MR; mild LAE.  Past Medical History:  Diagnosis Date  . Aphasia 09/17/2017  . 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  . Cardiac LV ejection fraction 21-30%   . Cardioembolic stroke ([place]) 69/48/5462   Right hemiparesis  . Cardiomyopathy ([place]) 04/2017  . Dysphagia  05/19/2017   Last known diet upgraded to regular diet with thin liquids on 06/27/2017, no records available  . 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 Fall 09/17/2017  . Graves disease   . H/O ischemic left MCA stroke 09/17/2017  . Hemiparesis of right dominant side as late effect of cerebral infarction ([place]) 09/17/2017  . Hyperlipidemia   . Hypertension   . Neuropathy 05/19/2017   s/p stroke  . Plaque psoriasis   . Shoulder subluxation, right, initial encounter 09/17/2017  . Weakness 09/17/2017    Past Surgical History:  Procedure Laterality Date  . NO PAST SURGERIES       Current Outpatient Medications  Medication Sig Dispense Refill  . acetaminophen (TYLENOL) 325 MG tablet Take 650 mg by mouth every 6 (six) hours as needed.    [place] apixaban (ELIQUIS) 5 MG TABS tablet Take 1 tablet (5 mg total) by mouth 2 (two) times daily. 180 tablet 1  . atorvastatin (LIPITOR) 40 MG tablet Take 1 tablet (40 mg total) by mouth daily. 90 tablet 1  . baclofen 5 MG TABS Take 5 mg by mouth 3 (three) times daily. 270 tablet 1  . clobetasol cream (TEMOVATE) 7.03 % Apply 1 application topically 2 (two) times daily. 30 g 0  . esomeprazole (NEXIUM) 40 MG capsule Take 1 capsule (40 mg total) by mouth daily at 12 noon. 90 capsule 1  . gabapentin (NEURONTIN) 300 MG capsule Take 2 capsules (600 mg total) by mouth at bedtime. 180 capsule 1  . metoprolol tartrate (LOPRESSOR) 25 MG  tablet Take 25 mg by mouth 2 (two) times daily.     No current facility-administered medications for this visit.     Allergies:   Dust mite extract and Penicillins    Social History:  The patient  reports that she has quit smoking. She has never used smokeless tobacco. She reports that she does not drink alcohol or use drugs.   Family History:  The patient's family history includes Early death in her father; Heart attack in her brother; Hyperlipidemia in her mother; Hypertension in her mother and sister; Mental illness  in her brother.    ROS:  Please see the history of present illness.   Otherwise, review of systems are positive for anxiety related to her heart health.   All other systems are reviewed and negative.    PHYSICAL EXAM: VS:  BP 110/62   Pulse 67   Ht 5\' 2"  (1.575 m)   Wt 123 lb 6.4 oz (56 kg)   SpO2 99%   BMI 22.57 kg/m  , BMI Body mass index is 22.57 kg/m. GEN: Well nourished, well developed, in no acute distress  HEENT: normal  Neck: no JVD, carotid bruits, or masses Cardiac: RRR; no murmurs, rubs, or gallops,no edema  Respiratory:  clear to auscultation bilaterally, normal work of breathing GI: soft, nontender, nondistended, + BS MS: no deformity or atrophy ; brace on right leg Skin: warm and dry, no rash Neuro:  Abnormal speech, unsteady gait Psych: euthymic mood, full affect     Recent Labs: 10/01/2017: ALT 21; BUN 11; Creatinine, Ser 0.84; Hemoglobin 13.6; Platelets 332.0; Potassium 4.7; Sodium 136; TSH 1.27   Lipid Panel    Component Value Date/Time   CHOL 181 12/18/2017 1037   TRIG 98.0 12/18/2017 1037   HDL 64.50 12/18/2017 1037   CHOLHDL 3 12/18/2017 1037   VLDL 19.6 12/18/2017 1037   LDLCALC 97 12/18/2017 1037     Other studies Reviewed: Additional studies/ records that were reviewed today with results demonstrating: EF still low on most recent echo..   ASSESSMENT AND PLAN:  1. Chronic  Systolic heart failure: Now able to walk on the treadmill.  We spoke about evaluation for AICD vs. contonued medical therapy.  Given that she is improving, they would like to continue with the current course to see if her LV and overall status continue to get better.  I think this is reasonable.  Given her anxiety, an AICD may not be a good thing for her quality of life. 2. Prior CVA: Eliquis for anticoagulation.  No bleeding issues.  3. Mitral regurgitation: MIld to moderate MR in the past.  Now mild on the last echo.  4. Anticoagulated: No bleeding problems at this time.       Current medicines are reviewed at length with the patient today.  The patient concerns regarding her medicines were addressed.  The following changes have been made:  No change  Labs/ tests ordered today include:  No orders of the defined types were placed in this encounter.   Recommend 150 minutes/week of aerobic exercise Low fat, low carb, high fiber diet recommended  Disposition:   FU in 6 months   Signed, Larae Grooms, MD  02/17/2018 11:13 AM    [facility] [place], [place], [place]  [address] Phone: [phone]; Fax: [phone]

## 2018-02-17 ENCOUNTER — Encounter: Payer: Self-pay | Admitting: Interventional Cardiology

## 2018-02-17 ENCOUNTER — Ambulatory Visit (INDEPENDENT_AMBULATORY_CARE_PROVIDER_SITE_OTHER): Payer: BLUE CROSS/BLUE SHIELD | Admitting: Interventional Cardiology

## 2018-02-17 VITALS — BP 110/62 | HR 67 | Ht 62.0 in | Wt 123.4 lb

## 2018-02-17 DIAGNOSIS — I639 Cerebral infarction, unspecified: Secondary | ICD-10-CM

## 2018-02-17 DIAGNOSIS — I5022 Chronic systolic (congestive) heart failure: Secondary | ICD-10-CM

## 2018-02-17 DIAGNOSIS — I340 Nonrheumatic mitral (valve) insufficiency: Secondary | ICD-10-CM

## 2018-02-17 NOTE — Patient Instructions (Signed)

## 2018-02-18 ENCOUNTER — Encounter: Payer: Self-pay | Admitting: Occupational Therapy

## 2018-02-18 ENCOUNTER — Ambulatory Visit: Payer: BLUE CROSS/BLUE SHIELD | Admitting: Occupational Therapy

## 2018-02-18 ENCOUNTER — Encounter: Payer: BLUE CROSS/BLUE SHIELD | Admitting: Occupational Therapy

## 2018-02-18 DIAGNOSIS — R278 Other lack of coordination: Secondary | ICD-10-CM

## 2018-02-18 DIAGNOSIS — R208 Other disturbances of skin sensation: Secondary | ICD-10-CM

## 2018-02-18 DIAGNOSIS — G8929 Other chronic pain: Secondary | ICD-10-CM

## 2018-02-18 DIAGNOSIS — I69318 Other symptoms and signs involving cognitive functions following cerebral infarction: Secondary | ICD-10-CM

## 2018-02-18 DIAGNOSIS — R41842 Visuospatial deficit: Secondary | ICD-10-CM

## 2018-02-18 DIAGNOSIS — I69351 Hemiplegia and hemiparesis following cerebral infarction affecting right dominant side: Secondary | ICD-10-CM | POA: Diagnosis not present

## 2018-02-18 DIAGNOSIS — M25511 Pain in right shoulder: Secondary | ICD-10-CM

## 2018-02-18 DIAGNOSIS — R29818 Other symptoms and signs involving the nervous system: Secondary | ICD-10-CM

## 2018-02-18 DIAGNOSIS — R2681 Unsteadiness on feet: Secondary | ICD-10-CM

## 2018-02-18 DIAGNOSIS — M6281 Muscle weakness (generalized): Secondary | ICD-10-CM

## 2018-02-18 NOTE — Therapy (Signed)
[facility] [address] [place], [place], [address] Phone: [phone]   Fax:  [phone]  Occupational Therapy Treatment  Patient Details  Name: Margaret Ortiz MRN: [id number] Date of Birth: 1960-11-08 Referring Provider: Howard Pouch, DO   Encounter Date: 02/18/2018  OT End of Session - 02/18/18 2051    Visit Number  34    Number of Visits  42    Date for OT Re-Evaluation  04/24/18    Authorization Type  BC/BS    OT Start Time  1346    OT Stop Time  1429    OT Time Calculation (min)  43 min    Activity Tolerance  Patient tolerated treatment well       Past Medical History:  Diagnosis Date  . Aphasia 09/17/2017  . 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  . Cardiac LV ejection fraction 21-30%   . Cardioembolic stroke ([place]) 91/63/8466   Right hemiparesis  . Cardiomyopathy ([facility]) 04/2017  . Dysphagia 05/19/2017   Last known diet upgraded to regular diet with thin liquids on 06/27/2017, no records available  . 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 Fall 09/17/2017  . Graves disease   . H/O ischemic left MCA stroke 09/17/2017  . Hemiparesis of right dominant side as late effect of cerebral infarction ([facility]) 09/17/2017  . Hyperlipidemia   . Hypertension   . Neuropathy 05/19/2017   s/p stroke  . Plaque psoriasis   . Shoulder subluxation, right, initial encounter 09/17/2017  . Weakness 09/17/2017    Past Surgical History:  Procedure Laterality Date  . NO PAST SURGERIES      There were no vitals filed for this visit.  Subjective Assessment - 02/18/18 2043    Subjective   Ok I get it now    Patient is accompained by:  Family member   sister and brother in law   Pertinent History  Lt MCA CVA 05/19/2017, Graves dz    Patient Stated Goals  use my Rt arm    Currently in Pain?  No/denies         Treatment:  Pt seen for aquatic therapy today. Pt entered pool via one step at time with LUE support. Pt needs mod facilitation and cueing to decrease reliance on LUE with all functional mobility.  Treatment took place in water 2.5 feet- 4 feet depending activity.  Treatment focused primarily on postural alignment , increasing activity of RLE, trunk, incorporating RUE into activity to address proximal stability related to postural control, finding midline and addressing RLE strength and control. Pt requires significant facilitation to weight shift into midline and then onto RLE with good alignment. Pt with poor hip control and pt has poor sensory awareness of R hip position and activation. Due to refractory principle, pt is forced to rely more on any proprioceptive input from RLE and trunk. Addressed pt activating into hip extension when completing lateral or anterior weight shifts onto RLE with thoracic extension.  With significant repetition and multiple activities pt by end of session was able to shift weight onto RLE with less facilitation and fear.  Also addressed fulling engaging RLE by assisting pt to exit pool using RLE as "power " leg (leading with RLE) and using LUE for support - pt able to do with mod facilitation. Pt more willing in water to decrease reliance on LUE and move more toward  midline.                      OT Short Term Goals - 02/18/18 2044      OT SHORT TERM GOAL #1   Title  Pt/family independent with HEP for RUE - 11/27/17    Status  Achieved      OT SHORT TERM GOAL #2   Title  Independent with splint wear and care for Rt hand prn    Status  Achieved      OT SHORT TERM GOAL #3   Title  Pt/family to verbalize understanding with A/E and task modifications to increase indpendence with ADLS/IADLS (for bathing, tying shoes, cutting food, simple meal prep)    Status  Achieved      OT SHORT TERM GOAL #4   Title  Pt to perform bathing with only min assist  and A/E prn while seated     Status  Achieved      OT SHORT TERM GOAL #5   Title  Pt to make sandwich w/ only supervision/cues prn    Status  Achieved      OT SHORT TERM GOAL #6   Title  Pt to demo 25 degrees shoulder flexion in prep for low level reaching with gross finger flexion - goals due 01/21/2018    Time  4    Period  Weeks    Status  Not Met      OT SHORT TERM GOAL #7   Title  Pt to perform environmental scanning at 90% accuracy during ambulation    Time  4    Period  Weeks    Status  On-going      OT SHORT TERM GOAL #8   Title  Pt to demo initiation of proximal movement of UE, scapula and shoulder girdle stability for trunk stabilization with functional ambulation - 03/24/18    Time  4    Period  Weeks    Status  New        OT Long Term Goals - 02/18/18 2044      OT LONG TERM GOAL #1   Title  Pt to perform shower transfers at mod I level w/ DME prn and no LOB - goals due 02/18/2018    Time  8    Period  Weeks    Status  Not Met   Pt requires supervision to min assist for safety.      OT LONG TERM GOAL #2   Title  Pt to perform simple meal prep at sup level using A/E and task modifications prn    Time  8    Period  Weeks    Status  Achieved      OT LONG TERM GOAL #3   Title  Pt to perform light house management tasks at sup level including: laundry, washing dishes, cleaning, making bed    Time  8    Period  Weeks    Status  Achieved   per pt report     OT LONG TERM GOAL #4   Title  Pt to demo 25% finger extension in prep for releasing objects Rt hand     Time  8    Period  Weeks    Status  Not Met   no active finger extension     OT LONG TERM GOAL #5   Title  (LTG #5 moved to STG for renewal period)      OT LONG  TERM GOAL #6   Title  Pt to use Rt hand as stabalizer 25% of the time    Time  8    Period  Weeks    Status  Not Met      OT LONG TERM GOAL #7   Title  Pt/family to verbalize understanding with neccessary accommodations, A/E, etc that  would be needed if pt is to return to living alone    Time  8    Period  Weeks    Status  Deferred   Pt will not be safe to return to living alone at this time     OT LONG TERM GOAL #8   Title  Pt/family independent with home use of NMES for Rt wrist and finger extension - due 02/26/18    Time  1    Period  Weeks    Status  On-going      OT LONG TERM GOAL  #9   Baseline  Pt independent with water based HEP w/ assist from family prn - due by 04/24/18    Time  8    Period  Weeks    Status  New      OT LONG TERM GOAL  #10   TITLE  Pt will improve postural control/alignment for functional mobility to decrease risk of falls during ADLS/IADL activities - due by 04/24/18    Time  8    Period  Weeks    Status  New            Plan - 02/18/18 2048    Clinical Impression Statement  Pt with slow progress toward goals. Pt requires significant repetition, facilitation and cueing to activate RUE, trunk and RLE    Occupational Profile and client history currently impacting functional performance  no significant PMH but current impairments extensive    Occupational performance deficits (Please refer to evaluation for details):  ADL's;IADL's;Work;Leisure;Social Participation    Rehab Potential  Fair    Current Impairments/barriers affecting progress:  severity of deficits    OT Frequency  --   2x/wk x2 week then 1 time per week for aquatic therapy (anticipate only 4 more weeks)   OT Duration  8 weeks    OT Treatment/Interventions  Aquatic Therapy;Self-care/ADL training;DME and/or AE instruction;Therapeutic activities;Neuromuscular education;Patient/family education;Electrical Stimulation    Plan  Aquatic therapy next session, then traditional  therapy on 02/26/18 to address remaining STG and LTG #8    Consulted and Agree with Plan of Care  Patient    Family Member Consulted  sister brother in law       Patient will benefit from skilled therapeutic intervention in order to improve the following  deficits and impairments:  Decreased coordination, Decreased range of motion, Difficulty walking, Improper body mechanics, Decreased endurance, Decreased safety awareness, Impaired sensation, Improper spinal/pelvic alignment, Impaired tone, Decreased knowledge of precautions, Decreased knowledge of use of DME, Decreased balance, Impaired UE functional use, Pain, Decreased cognition, Decreased mobility, Decreased strength, Impaired perceived functional ability, Impaired vision/preception  Visit Diagnosis: Hemiplegia and hemiparesis following cerebral infarction affecting right dominant side (HCC)  Unsteadiness on feet  Other symptoms and signs involving the nervous system  Muscle weakness (generalized)  Other disturbances of skin sensation  Chronic right shoulder pain  Visuospatial deficit  Other lack of coordination  Other symptoms and signs involving cognitive functions following cerebral infarction    Problem List Patient Active Problem List   Diagnosis Date Noted  . Fall 09/17/2017  . Hemiparesis of  right dominant side as late effect of cerebral infarction ([place]) 09/17/2017  . Shoulder subluxation, right, initial encounter 09/17/2017  . Cardioembolic stroke ([facility]) 32/99/2426  . Weakness 09/17/2017  . Aphasia 09/17/2017  . 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 Cardiomyopathy ([place]) 09/17/2017  . H/O ischemic left MCA stroke 09/17/2017  . Cardiac LV ejection fraction 21-30%   . Graves disease   . Plaque psoriasis   . Neuropathy 05/19/2017    Quay Burow, OTR/L 02/18/2018, 8:53 PM  [facility] [address] [place], [place], [address] Phone: [phone]   Fax:  [phone]  Name: Sharonne Ricketts MRN: [id number] Date of Birth: 1960-10-24

## 2018-02-19 ENCOUNTER — Encounter: Payer: BLUE CROSS/BLUE SHIELD | Admitting: Occupational Therapy

## 2018-02-19 ENCOUNTER — Ambulatory Visit: Payer: BLUE CROSS/BLUE SHIELD | Admitting: Speech Pathology

## 2018-02-19 ENCOUNTER — Ambulatory Visit: Payer: BLUE CROSS/BLUE SHIELD | Admitting: Rehabilitative and Restorative Service Providers"

## 2018-02-19 DIAGNOSIS — R4701 Aphasia: Secondary | ICD-10-CM

## 2018-02-19 DIAGNOSIS — R41841 Cognitive communication deficit: Secondary | ICD-10-CM

## 2018-02-19 DIAGNOSIS — R471 Dysarthria and anarthria: Secondary | ICD-10-CM

## 2018-02-19 DIAGNOSIS — I69351 Hemiplegia and hemiparesis following cerebral infarction affecting right dominant side: Secondary | ICD-10-CM | POA: Diagnosis not present

## 2018-02-19 DIAGNOSIS — R29818 Other symptoms and signs involving the nervous system: Secondary | ICD-10-CM

## 2018-02-19 DIAGNOSIS — R2681 Unsteadiness on feet: Secondary | ICD-10-CM

## 2018-02-19 DIAGNOSIS — M6281 Muscle weakness (generalized): Secondary | ICD-10-CM

## 2018-02-19 NOTE — Therapy (Signed)
[facility] [address] [place], [place], [address] Phone: [phone]   Fax:  [phone]  Speech Language Pathology Treatment and Discharge Summary  Patient Details  Name: Margaret Ortiz MRN: [id number] Date of Birth: [birth year] Referring Provider: Ma Hillock, DO   Encounter Date: 02/19/2018  End of Session - 02/19/18 1137    Visit Number  27    Number of Visits  33    Date for SLP Re-Evaluation  02/20/18    SLP Start Time  6659    SLP Stop Time   1059    SLP Time Calculation (min)  44 min    Activity Tolerance  Patient tolerated treatment well       Past Medical History:  Diagnosis Date  . Aphasia 09/17/2017  . 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  . Cardiac LV ejection fraction 21-30%   . Cardioembolic stroke ([facility]) 93/57/0177   Right hemiparesis  . Cardiomyopathy ([facility]) 04/2017  . Dysphagia 05/19/2017   Last known diet upgraded to regular diet with thin liquids on 06/27/2017, no records available  . 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 Fall 09/17/2017  . Graves disease   . H/O ischemic left MCA stroke 09/17/2017  . Hemiparesis of right dominant side as late effect of cerebral infarction ([facility]) 09/17/2017  . Hyperlipidemia   . Hypertension   . Neuropathy 05/19/2017   s/p stroke  . Plaque psoriasis   . Shoulder subluxation, right, initial encounter 09/17/2017  . Weakness 09/17/2017    Past Surgical History:  Procedure Laterality Date  . NO PAST SURGERIES      There were no vitals filed for this visit.  Subjective Assessment - 02/19/18 1116    Subjective  Pt arrives with brother-in-law.    Patient is accompained by:  Family member   Remo Lipps, brother-in-law   Currently in Pain?  No/denies            ADULT SLP TREATMENT - 02/19/18 1015      General Information   Behavior/Cognition  Alert;Cooperative      Treatment Provided   Treatment provided  Cognitive-Linquistic      Pain Assessment   Pain Assessment  No/denies pain      Cognitive-Linquistic Treatment   Treatment focused on  Aphasia;Patient/family/caregiver education    Skilled Treatment  SLP focused session on pt/caregiver education; brother-in-law Remo Lipps had several questions re: aphasia, and about how to improve conversations and communication at home. He reports that he takes additional time with pt and that he feels her communication is generally "effective" when she slows down. SLP recommended strategies and questions to ask pt when her message is unclear. Provided handout with community resources for aphasia, supportive conversation techniques for aphasia, multimodal communication, and ways to cue pt to facilitate verbal expression. Pt attempted to tell her brother-in-law she had been reading abstracts (of research articles), attempting correction of paraphasia unsuccessfully. SLP demo'd how to use rephrasing to confirm pt's message/reveal competence, and cue pt to write the word (rare min A) that was not understood. Pt wrote "abstrat," successfully relaying her message. SLP suggested activities to continue working on language at home, and encouraged pt to record audio/video of her speech to improve her error awareness.       Assessment / Recommendations / Plan   Plan  Discharge SLP treatment due to (comment)   4/5 LTG  met     Progression Toward Goals   Progression toward goals  Goals met, education completed, patient discharged from [facility] Education - 02/19/18 1135    Education provided  Yes    Education Details  [place], suggested activities for home, aphasia education, multimodal communication, supportive conversation techniques, [employer] speech therapy, neuropsych recommended    Person(s) Educated  Patient;Other (comment)   brother-in-law Remo Lipps   Methods   Explanation;Demonstration;Handout    Comprehension  Verbalized understanding       SLP Short Term Goals - 02/19/18 1142      SLP SHORT TERM GOAL #6   Title  Pt will reduce safety risks by communicating personal needs, requests for help, directives, using AAC if necessary, with occasional min A over 3 sessions.    Status  Not Met      SLP SHORT TERM GOAL #7   Title  Pt will increase communication effectiveness by answering questions in 90% of opportunities, using AAC if needed, over 3 sessions independently.    Status  Not Met      SLP SHORT TERM GOAL #8   Title  Pt will use multimodal communication/AAC when necessary in 5 minutes simple conversation with occasional min A over 3 sessions    Status  Not Met       SLP Long Term Goals - 02/19/18 1139      SLP LONG TERM GOAL #1   Title  Pt will participate functionally in 15 minutes simple-mod complex conversation with multimodal communication over 3 sessions.    Status  Achieved      SLP LONG TERM GOAL #2   Title  Pt will demo understanding of 10 minutes simple-mod complex conversation with compensations over 3 sessions    Status  Achieved      SLP LONG TERM GOAL #3   Title  Pt will demo error awareness by attempting correction or by nonverbal response to errors with 90% success    Time  1    Period  Weeks    Status  Achieved      SLP LONG TERM GOAL #4   Title  Pt will use multimodal communication/AAC when necessary in 10 minutes simple-mod complex conversation with rare min A over 6 sessions    Time  1    Period  Weeks    Status  Achieved      SLP LONG TERM GOAL #5   Title  pt will demo alternating attention between two simple cognitive-linguistic tasks for 85% accuracy with self correction/double checking answers (aphasia compensations allowed) over 3 sessions    Time  1    Period  Weeks    Status  Not Met       Plan - 02/19/18 1143    Clinical Impression Statement  Pt has persisting mild cognitive impairments,  dysarthria, and 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 Paraphasias and neologisms continue with faster rate of speech, however with slow rate conversation is functional for simple-mod complex subject matter. Despite repeated education re: prognosis pt continues to perseverate on returning to "normal speech." Awareness has limited progress toward cognitive goals. Neuropsych consult may be beneficial. Pt and brother-in-law educated today re: follow-up recommendations, community supports for aphasia, and ways to support pt's communication and language at home. 4/5 LTGs achieved, pt and family in agreement with d/c at this time.     Speech Therapy Frequency  --   d/c  Duration  --   d/c   Treatment/Interventions  Multimodal communcation approach;Compensatory strategies;Language facilitation;Compensatory techniques;Cueing hierarchy;Internal/external aids;Functional tasks;SLP instruction and feedback;Patient/family education    Potential to Achieve Goals  Fair    Potential Considerations  Severity of impairments;Ability to learn/carryover information    Consulted and Agree with Plan of Care  Patient;Family member/caregiver       Patient will benefit from skilled therapeutic intervention in order to improve the following deficits and impairments:   Aphasia  Cognitive communication deficit  Dysarthria and anarthria    Problem List Patient Active Problem List   Diagnosis Date Noted  . Fall 09/17/2017  . Hemiparesis of right dominant side as late effect of cerebral infarction ([facility]) 09/17/2017  . Shoulder subluxation, right, initial encounter 09/17/2017  . Cardioembolic stroke ([facility]) 92/95/7473  . Weakness 09/17/2017  . Aphasia 09/17/2017  . 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 Cardiomyopathy ([facility] [place] [facility]) 09/17/2017  . H/O ischemic left MCA stroke 09/17/2017  . Cardiac LV ejection fraction 21-30%   . Graves disease   . Plaque psoriasis   . Neuropathy  05/19/2017   SPEECH THERAPY DISCHARGE SUMMARY  Visits from Start of Care: 27  Current functional level related to goals / functional outcomes: Expressive language and auditory comprehension are functional for 15 minutes simple-mod complex conversation. See goals above.   Remaining deficits: Mild cognitive impairments, dysarthria and moderate fluent aphasia persist   Education / Equipment: [employer] for aphasia, supportive conversation and multimodal communication, language/cognitive activities for home  Plan: Patient agrees to discharge.  Patient goals were partially met. Patient is being discharged due to meeting the stated rehab goals.  ?????         Deneise Lever, [place], CCC-SLP Speech-Language Pathologist   Aliene Altes 02/19/2018, 11:52 AM  [facility] [address] [place], [place], [address] Phone: [phone]   Fax:  [phone]   Name: Neilah Fulwider MRN: [id number] Date of Birth: November 27, 1960

## 2018-02-19 NOTE — Therapy (Signed)
[facility] [address] [place], [place], [address] Phone: [phone]   Fax:  [phone]  Physical Therapy Treatment  Patient Details  Name: Margaret Ortiz MRN: [id number] Date of Birth: March 21, 1961 Referring Provider: Howard Pouch, DO   Encounter Date: 02/19/2018  PT End of Session - 02/19/18 1504    Visit Number  28    Number of Visits  33    Date for PT Re-Evaluation  02/27/18    Authorization Type  BCBS     PT Start Time  1105    PT Stop Time  1150    PT Time Calculation (min)  45 min    Equipment Utilized During Treatment  --   min guard to S   Activity Tolerance  Patient tolerated treatment well    Behavior During Therapy  [facility] for tasks assessed/performed       Past Medical History:  Diagnosis Date  . Aphasia 09/17/2017  . 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  . Cardiac LV ejection fraction 21-30%   . Cardioembolic stroke ([place]) 87/56/4332   Right hemiparesis  . Cardiomyopathy ([place]) 04/2017  . Dysphagia 05/19/2017   Last known diet upgraded to regular diet with thin liquids on 06/27/2017, no records available  . 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 Fall 09/17/2017  . Graves disease   . H/O ischemic left MCA stroke 09/17/2017  . Hemiparesis of right dominant side as late effect of cerebral infarction ([place]) 09/17/2017  . Hyperlipidemia   . Hypertension   . Neuropathy 05/19/2017   s/p stroke  . Plaque psoriasis   . Shoulder subluxation, right, initial encounter 09/17/2017  . Weakness 09/17/2017    Past Surgical History:  Procedure Laterality Date  . NO PAST SURGERIES      There were no vitals filed for this visit.  Subjective Assessment - 02/19/18 1110    Subjective  The patient reports she has been to 2 pool sessions.    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      Currently in Pain?  No/denies                       [facility] Adult PT Treatment/Exercise - 02/19/18 1504      Ambulation/Gait   Ambulation/Gait  Yes    Ambulation/Gait Assistance  4: Min guard;6: Modified independent (Device/Increase time)    Ambulation/Gait Assistance Details  The patient is able to ambulate mod indep with SPC; PT provided some tactile cues for R hip intiiation, R hip stability, R knee flexion during gait.  Also ambulated without device with HHA x 50 feet.      Ambulation Distance (Feet)  400 Feet   230 x 3   Gait Pattern  Decreased stance time - right;Decreased step length - left;Decreased stride length;Decreased hip/knee flexion - right;Decreased dorsiflexion - right;Decreased weight shift to right;Right circumduction;Right hip hike;Lateral hip instability;Poor foot clearance - right    Ambulation Surface  Level;Indoor    Gait velocity  1.95 ft/sec      Neuro Re-ed    Neuro Re-ed Details   Sit<>stand with R leg posterior and tactile cues for R hip initiation, R knee extension while loading weight through R  LE with L leg on compliant surfaces; sit<>stand dec'ing left weight shift by adding compliant surface under left foot.  Elliptical emphasizing R hp initiation and R knee control with mod A for tactile cues and reduce R knee recurvatum (AFO removed).        Exercises   Exercises  Other Exercises    Other Exercises   Step ups with R LE for knee control.  Attempted left hip hiking, but motor control issues made this activity challenging (PT had patient lift knee, then hip, and moved into modified lunge position working on hip stability right side).              PT Education - 02/19/18 1503    Education Details  Discussed plan to renew patient, and goals of physical therapy    Person(s) Educated  Patient;Other  (comment)   brother in law   Methods  Explanation    Comprehension  Verbalized understanding       PT Short Term Goals - 01/28/18 1212      PT SHORT TERM GOAL #1   Title  The patient will ambulate on level community surfaces with SPC mod indep for improved mobility.    Baseline  Using [facility] in community for limited surfaces.    Time  4    Period  Weeks    Status  Achieved      PT SHORT TERM GOAL #2   Title  The patient will improve gait speed from 0.85 ft/sec to > or equal to 1.2 ft/sec to demonstrate transition from "household ambulator" to "limited community ambulator" classification of gait.    Baseline  1.17 ft/sec on 01/28/18    Time  4    Period  Weeks    Status  Partially Met      PT SHORT TERM GOAL #3   Title  The patient will improve Berg balance score from 42/56 to > or equal to 45/56 to demo dec'ing risk for falls.    Baseline  Berg=45/56 on 01/28/18    Time  4    Period  Weeks    Status  Achieved      PT SHORT TERM GOAL #4   Title  The patient will ambulate x 600 ft nonstop to demo improved endurance for functional gait.    Baseline  Patient able to ambulate nonstop x 600 ft mod indep on level surfaces with SPc.     Time  4    Period  Weeks    Status  Achieved        PT Long Term Goals - 02/19/18 1511      PT LONG TERM GOAL #1   Title  The patient will perform updated HEP with intermittent assist for post d/c progression of exercise.    Time  8    Period  Weeks    Status  On-going      PT LONG TERM GOAL #2   Title  The patient will move floor<>stand with mod indep using L UE support on surface due to h/o falls.    Time  8    Period  Weeks    Status  On-going      PT LONG TERM GOAL #3   Title  The patient will improve gait speed from 0.85 ft/sec to > or equal to 1.5 ft/sec to demo dec'ing risk for falls.    Baseline  0.98 ft.sec on 12/22/2017 improved to 1.95 ft/sec on 02/19/18.  Time  8    Period  Weeks    Status  Achieved      PT LONG TERM GOAL #4    Title  The patient will improve Berg score from 42/56 (on 12/22/2017) up to 48/56 to demo dec'ing risk for falls.    Time  8    Period  Weeks    Status  On-going      PT LONG TERM GOAL #5   Title  The patient will negotiate 12 steps without a handrail with step to pattern mod indep to access home entries.    Baseline  one handrail mod indep with step to pattern 12/22/2017    Time  8    Period  Weeks    Status  On-going            Plan - 02/19/18 1511    Clinical Impression Statement  The patient met LTG for gait speed.  PT to continue to progress towards other LTGs anticipating renewal.  Emphasizing gait speed, gait mechanics (R hip control, R weight shifting, R hip stability, R knee control), R motor control and isolated movements. Also want to begin transition to post d/c community program (will discuss local options for patient).     PT Treatment/Interventions  ADLs/Self Care Home Management;Gait training;Stair training;Functional mobility training;Therapeutic activities;Therapeutic exercise;Balance training;Neuromuscular re-education;Patient/family education;Orthotic Fit/Training;Electrical Stimulation;Manual techniques;DME Instruction    PT Next Visit Plan  CHECK LONG TERM GOALS; renew next week; Treadmill to work on gait speed (push at faster speeds as tolerable), high level balance, Tall kneeling for right hip activation, R LE strengthening. work on knee flexion during gait.  Patient may be interested in community wellness/ clarify with family if they have access to gym.      Consulted and Agree with Plan of Care  Patient       Patient will benefit from skilled therapeutic intervention in order to improve the following deficits and impairments:  Abnormal gait, Impaired sensation, Pain, Postural dysfunction, Impaired tone, Decreased mobility, Decreased coordination, Decreased activity tolerance, Decreased endurance, Decreased strength, Difficulty walking, Decreased balance  Visit  Diagnosis: Other symptoms and signs involving the nervous system  Unsteadiness on feet  Muscle weakness (generalized)     Problem List Patient Active Problem List   Diagnosis Date Noted  . Fall 09/17/2017  . Hemiparesis of right dominant side as late effect of cerebral infarction ([place]) 09/17/2017  . Shoulder subluxation, right, initial encounter 09/17/2017  . Cardioembolic stroke ([place]) 49/75/3005  . Weakness 09/17/2017  . Aphasia 09/17/2017  . 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 Cardiomyopathy ([place]) 09/17/2017  . H/O ischemic left MCA stroke 09/17/2017  . Cardiac LV ejection fraction 21-30%   . Graves disease   . Plaque psoriasis   . Neuropathy 05/19/2017    Margaret Ortiz, PT 02/19/2018, 3:13 PM  [facility] [address] [place], [place], [address] Phone: [phone]   Fax:  [phone]  Name: Margaret Ortiz MRN: [id number] Date of Birth: 1961/05/08

## 2018-02-19 NOTE — Patient Instructions (Addendum)
[employer] [facility] [employer] [email or web address]   Neuropsychologist: Dr. Ilean Skill [email or web address]   [email or web address].http://www.foster.info/ :[facility] Therapy ap (on your desktop or download TalkPath therapy for iPad) TalkPath News: for reading and comprehension of news articles   Log in with your username : [email or web address]  and [place]: www.aphasiaproject.org  [phone] [place] group Mondays 4-5 Info@aphasiaproject [email or web address] They are talking about starting a group in [employer], but right now Margaret Ortiz is the closest one.  Vocational rehabilitation:  [email or web address]                 Tips for Talking with People who have Aphasia  . Say one thing at a time . Don't  rush - slow down, be patient . Talk face to face . Reduce background noise . Relax - be natural . Use pen and paper . Write down key words . Draw diagrams or pictures . Don't pretend you understand . Ask what helps . Recap - check you both understand . Be a partner, not a therapist   Aphasia does not affect intelligence, only language. The person with aphasia can still: make decisions, have opinions, and socialize.

## 2018-02-25 ENCOUNTER — Encounter: Payer: BLUE CROSS/BLUE SHIELD | Admitting: Occupational Therapy

## 2018-02-25 ENCOUNTER — Encounter: Payer: Self-pay | Admitting: Physical Therapy

## 2018-02-25 ENCOUNTER — Telehealth: Payer: Self-pay | Admitting: Family Medicine

## 2018-02-25 ENCOUNTER — Encounter: Payer: Self-pay | Admitting: Occupational Therapy

## 2018-02-25 ENCOUNTER — Ambulatory Visit: Payer: BLUE CROSS/BLUE SHIELD | Attending: Family Medicine | Admitting: Physical Therapy

## 2018-02-25 ENCOUNTER — Ambulatory Visit: Payer: BLUE CROSS/BLUE SHIELD | Admitting: Occupational Therapy

## 2018-02-25 DIAGNOSIS — R208 Other disturbances of skin sensation: Secondary | ICD-10-CM | POA: Diagnosis present

## 2018-02-25 DIAGNOSIS — G8929 Other chronic pain: Secondary | ICD-10-CM | POA: Diagnosis present

## 2018-02-25 DIAGNOSIS — R41842 Visuospatial deficit: Secondary | ICD-10-CM | POA: Diagnosis present

## 2018-02-25 DIAGNOSIS — I69351 Hemiplegia and hemiparesis following cerebral infarction affecting right dominant side: Secondary | ICD-10-CM

## 2018-02-25 DIAGNOSIS — I69318 Other symptoms and signs involving cognitive functions following cerebral infarction: Secondary | ICD-10-CM

## 2018-02-25 DIAGNOSIS — M6281 Muscle weakness (generalized): Secondary | ICD-10-CM

## 2018-02-25 DIAGNOSIS — M25511 Pain in right shoulder: Secondary | ICD-10-CM | POA: Diagnosis present

## 2018-02-25 DIAGNOSIS — R2681 Unsteadiness on feet: Secondary | ICD-10-CM | POA: Diagnosis present

## 2018-02-25 DIAGNOSIS — R29818 Other symptoms and signs involving the nervous system: Secondary | ICD-10-CM

## 2018-02-25 DIAGNOSIS — R2689 Other abnormalities of gait and mobility: Secondary | ICD-10-CM | POA: Diagnosis present

## 2018-02-25 NOTE — Telephone Encounter (Signed)
Left message for case manager patient needs appointment prior to any orders being placed.

## 2018-02-25 NOTE — Therapy (Signed)
[facility] [address] [place], [place], [address] Phone: [phone]   Fax:  [phone]  Physical Therapy Treatment  Patient Details  Name: Margaret Ortiz MRN: [id number] Date of Birth: 1960/10/23 Referring Provider: Howard Pouch, DO   Encounter Date: 02/25/2018  PT End of Session - 02/25/18 1255    Visit Number  29    Number of Visits  33    Authorization Type  BCBS     PT Start Time  2094    PT Stop Time  1230    PT Time Calculation (min)  42 min    Equipment Utilized During Treatment  --   min guard to Sup - pt refusing gait belt   Activity Tolerance  Patient tolerated treatment well    Behavior During Therapy  WFL for tasks assessed/performed       Past Medical History:  Diagnosis Date  . Aphasia 09/17/2017  . 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  . Cardiac LV ejection fraction 21-30%   . Cardioembolic stroke ([place]) 70/96/2836   Right hemiparesis  . Cardiomyopathy ([place]) 04/2017  . Dysphagia 05/19/2017   Last known diet upgraded to regular diet with thin liquids on 06/27/2017, no records available  . 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 Fall 09/17/2017  . Graves disease   . H/O ischemic left MCA stroke 09/17/2017  . Hemiparesis of right dominant side as late effect of cerebral infarction ([place]) 09/17/2017  . Hyperlipidemia   . Hypertension   . Neuropathy 05/19/2017   s/p stroke  . Plaque psoriasis   . Shoulder subluxation, right, initial encounter 09/17/2017  . Weakness 09/17/2017    Past Surgical History:  Procedure Laterality Date  . NO PAST SURGERIES      There were no vitals filed for this visit.  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      Currently in Pain?  No/denies    Pain Score  0-No pain         [facility] PT Assessment - 02/25/18 0001      Berg Balance Test   Sit to Stand  Able to stand without using hands and stabilize independently    Standing Unsupported  Able to stand safely 2 minutes    Sitting with Back Unsupported but Feet Supported on Floor or Stool  Able to sit safely and securely 2 minutes    Stand to Sit  Sits safely with minimal use of hands    Transfers  Able to transfer safely, minor use of hands    Standing Unsupported with Eyes Closed  Able to stand 10 seconds safely    Standing Ubsupported with Feet Together  Able to place feet together independently and stand 1 minute safely    From Standing, Reach Forward with Outstretched Arm  Can reach forward >12 cm safely (5")    From Standing Position, Pick up Object from Floor  Able to pick up shoe safely and easily    From Standing Position, Turn to Look Behind Over each Shoulder  Looks behind from both sides and  weight shifts well    Turn 360 Degrees  Able to turn 360 degrees safely but slowly    Standing Unsupported, Alternately Place Feet on Step/Stool  Able to complete >2 steps/needs minimal assist    Standing Unsupported, One Foot in Front  Able to plae foot ahead of the other independently and hold 30 seconds    Standing on One Leg  Able to lift leg independently and hold > 10 seconds    Total Score  49                   [facility] Adult PT Treatment/Exercise - 02/25/18 0001      Ambulation/Gait   Ambulation/Gait  Yes    Ambulation/Gait Assistance  5: Supervision;6: Modified independent (Device/Increase time)    Ambulation/Gait Assistance Details  ambulates in clinic and community with [facility] - does require asssit for opening doors; verbal cueing to reduce circumduction and increased hip/knee flexion      Ambulation Distance (Feet)  500 Feet    Assistive device  Straight cane    Gait Pattern  Decreased stance time - right;Decreased step length - left;Decreased stride length;Decreased hip/knee flexion - right;Decreased dorsiflexion - right;Decreased weight shift to right;Right circumduction;Right hip hike;Lateral hip instability;Poor foot clearance - right    Ambulation Surface  Level;Indoor    Stairs  Yes    Stairs Assistance  6: Modified independent (Device/Increase time);5: Supervision    Stairs Assistance Details (indicate cue type and reason)  close supervision for safety    Stair Management Technique  One rail Left;Forwards;Step to pattern    Number of Stairs  4   3 sets   Height of Stairs  6      Neuro Re-ed    Neuro Re-ed Details   heel sitting to tall kneeling 2 x 10 reps focusing on hip activation and anterior weight shifting; trunk rotation in tall kneeling focusing on full range of motion rather than compensatory motions at shoulders/C-spine; tall kneeling to L 1/2 kneeling focusing on R hip stability with L hip dynamic motion as well as general motor control as patient prefers use of momentum; 1/2 kneeling to stand with L UE support x 4 with min guard to supervision for safety and stability;tandem gait in // bars focuing on reduced hip circumduction; backwards walking in // bars x 3              PT Education - 02/25/18 1255    Education provided  Yes    Education Details  Education on progress and breaking down floor <> stand transfer by components     Person(s) Educated  Patient    Methods  Explanation    Comprehension  Verbalized understanding;Returned demonstration       PT Short Term Goals - 01/28/18 1212      PT SHORT TERM GOAL #1   Title  The patient will ambulate on level community surfaces with SPC mod indep for improved mobility.    Baseline  Using [facility] in community for limited surfaces.    Time  4    Period  Weeks    Status  Achieved      PT SHORT TERM  GOAL #2   Title  The patient will improve gait speed from 0.85 ft/sec to > or equal to 1.2 ft/sec to demonstrate transition from "household ambulator" to "limited community ambulator" classification of gait.    Baseline  1.17 ft/sec on 01/28/18    Time  4  Period  Weeks    Status  Partially Met      PT SHORT TERM GOAL #3   Title  The patient will improve Berg balance score from 42/56 to > or equal to 45/56 to demo dec'ing risk for falls.    Baseline  Berg=45/56 on 01/28/18    Time  4    Period  Weeks    Status  Achieved      PT SHORT TERM GOAL #4   Title  The patient will ambulate x 600 ft nonstop to demo improved endurance for functional gait.    Baseline  Patient able to ambulate nonstop x 600 ft mod indep on level surfaces with SPc.     Time  4    Period  Weeks    Status  Achieved        PT Long Term Goals - 02/25/18 1256      PT LONG TERM GOAL #1   Title  The patient will perform updated HEP with intermittent assist for post d/c progression of exercise.    Baseline  Patient is doing prescribed exercises and walking as well as aquatic therapy     Time  8    Period  Weeks    Status  On-going      PT LONG TERM GOAL #2   Title  The patient will move floor<>stand with mod indep using L UE support on surface due to h/o falls.    Time  8    Period  Weeks    Status  On-going      PT LONG TERM GOAL #3   Title  The patient will improve gait speed from 0.85 ft/sec to > or equal to 1.5 ft/sec to demo dec'ing risk for falls.    Baseline  0.98 ft.sec on 12/22/2017 improved to 1.95 ft/sec on 02/19/18.    Time  8    Period  Weeks    Status  Achieved      PT LONG TERM GOAL #4   Title  The patient will improve Berg score from 42/56 (on 12/22/2017) up to 48/56 to demo dec'ing risk for falls.    Baseline  Berg (02/25/18) 49/56    Time  8    Period  Weeks    Status  Achieved      PT LONG TERM GOAL #5   Title  The patient will negotiate 12 steps without a handrail with step to pattern mod  indep to access home entries.    Baseline  one handrail mod indep with step to pattern 02/25/18    Time  8    Period  Weeks    Status  On-going            Plan - 02/25/18 1259    Clinical Impression Statement  Patient making progress towards LTG's - able to meet Berg goal with good compensation noted for L SLS for greater than 10 seconds without support. PT session focusing on improving floor to stand transfers with break down of each component for mastery of skill. Also working on reducing R hip circumduction with improved R knee flexion during gait. Will plan to renew and continue to progress towards goals.     Rehab Potential  Good    Clinical Impairments Affecting Rehab Potential  Patient motivated to participate,  aphasia continues to be a barrier    PT Frequency  2x / week    PT Duration  8 weeks  PT Treatment/Interventions  ADLs/Self Care Home Management;Gait training;Stair training;Functional mobility training;Therapeutic activities;Therapeutic exercise;Balance training;Neuromuscular re-education;Patient/family education;Orthotic Fit/Training;Electrical Stimulation;Manual techniques;DME Instruction    PT Next Visit Plan  Treadmill to work on gait speed (push at faster speeds as tolerable), high level balance, Tall kneeling for right hip activation, R LE strengthening. work on knee flexion during gait.  Patient may be interested in community wellness/ clarify with family if they have access to gym.      Consulted and Agree with Plan of Care  Patient       Patient will benefit from skilled therapeutic intervention in order to improve the following deficits and impairments:  Abnormal gait, Impaired sensation, Pain, Postural dysfunction, Impaired tone, Decreased mobility, Decreased coordination, Decreased activity tolerance, Decreased endurance, Decreased strength, Difficulty walking, Decreased balance  Visit Diagnosis: Other symptoms and signs involving the nervous  system  Unsteadiness on feet  Muscle weakness (generalized)     Problem List Patient Active Problem List   Diagnosis Date Noted  . Fall 09/17/2017  . Hemiparesis of right dominant side as late effect of cerebral infarction ([place]) 09/17/2017  . Shoulder subluxation, right, initial encounter 09/17/2017  . Cardioembolic stroke ([place]) 39/53/2023  . Weakness 09/17/2017  . Aphasia 09/17/2017  . 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 Cardiomyopathy ([place]) 09/17/2017  . H/O ischemic left MCA stroke 09/17/2017  . Cardiac LV ejection fraction 21-30%   . Graves disease   . Plaque psoriasis   . Neuropathy 05/19/2017    Lanney Gins, PT, DPT 02/25/18 1:10 PM Pager: [phone]   [facility] [address] [place], [place], [address] Phone: [phone]   Fax:  [phone]  Name: Margaret Ortiz MRN: [id number] Date of Birth: 1961-06-06

## 2018-02-25 NOTE — Therapy (Signed)
[facility] [address] [place], [place], [address] Phone: [phone]   Fax:  [phone]  Occupational Therapy Treatment  Patient Details  Name: Margaret Ortiz MRN: [id number] Date of Birth: [birth year] Referring Provider: Howard Pouch, DO   Encounter Date: 02/25/2018  OT End of Session - 02/25/18 1600    Visit Number  35    Number of Visits  42    Date for OT Re-Evaluation  04/24/18    Authorization Type  BC/BS    OT Start Time  1259    OT Stop Time  1345    OT Time Calculation (min)  46 min    Activity Tolerance  Patient tolerated treatment well       Past Medical History:  Diagnosis Date  . Aphasia 09/17/2017  . 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  . Cardiac LV ejection fraction 21-30%   . Cardioembolic stroke ([place]) 25/00/3704   Right hemiparesis  . Cardiomyopathy ([place]) 04/2017  . Dysphagia 05/19/2017   Last known diet upgraded to regular diet with thin liquids on 06/27/2017, no records available  . 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 Fall 09/17/2017  . Graves disease   . H/O ischemic left MCA stroke 09/17/2017  . Hemiparesis of right dominant side as late effect of cerebral infarction ([place]) 09/17/2017  . Hyperlipidemia   . Hypertension   . Neuropathy 05/19/2017   s/p stroke  . Plaque psoriasis   . Shoulder subluxation, right, initial encounter 09/17/2017  . Weakness 09/17/2017    Past Surgical History:  Procedure Laterality Date  . NO PAST SURGERIES      There were no vitals filed for this visit.  Subjective Assessment - 02/25/18 1556    Subjective   Pt smiling and nodding through session    Patient is accompained by:  Family member   sister and brother in law   Pertinent History  Lt MCA CVA 05/19/2017, Graves dz    Patient Stated Goals  use my Rt arm    Currently in Pain?  No/denies        Treatment:  Pt seen for aquatic therapy today - session occurred in water 2- 4 feet deep depending upon activity.  Pt entered the pool via steps with mod facilitation to utilize activity to increase activation of RLE, R trunk and R proximal shoulder girdle. Pt used railing with LUE however today was less dependent upon railing for balance and control.  Session focused on: increasing activation of RLE, R trunk and R proximal shoulder girdle for improved postural alignment, finding midline and postural control and dynamic standing balance.  Pt is much less fearful and therefore more able/willling to weight shift to R in order to more fully engaged R side.  Addressed RLE strengthening addressing modified hip extension (10 reps x2) and ab/adduction (10 reps x2) - pt needs max vc's and max facilitation initially due to motor planning and absent sensation however today was able to more quickly and efficiently move in repetitive pattern after practice.  Also addressed static stability with RLE/trunk/proximal shoulder with forced approach by lifting/moving LLE with only light LUE support (pt only allowed to use dumb bell float for LUE support).  Primarily worked in Psychiatrist to provide pt with more support due to buoyancy - by end of session pt able to shift to RLE and maintain LLE off pool  floor with only intermittent light assist.  Addressed functional ambulation in pool without a device focusing on more automatic faster stepping pattern to decrease fall risk and increase automaticity - pt able to do this at much faster speed without as much support in water.  Pt exited water via steps using RLE as power leg with mod facilitation today and decreased use of LUE.                        OT Short Term Goals - 02/25/18 1557      OT SHORT TERM GOAL #1   Title  Pt/family independent with HEP for RUE - 11/27/17    Status  Achieved      OT SHORT TERM GOAL #2   Title  Independent with splint  wear and care for Rt hand prn    Status  Achieved      OT SHORT TERM GOAL #3   Title  Pt/family to verbalize understanding with A/E and task modifications to increase indpendence with ADLS/IADLS (for bathing, tying shoes, cutting food, simple meal prep)    Status  Achieved      OT SHORT TERM GOAL #4   Title  Pt to perform bathing with only min assist and A/E prn while seated     Status  Achieved      OT SHORT TERM GOAL #5   Title  Pt to make sandwich w/ only supervision/cues prn    Status  Achieved      OT SHORT TERM GOAL #6   Title  Pt to demo 25 degrees shoulder flexion in prep for low level reaching with gross finger flexion - goals due 01/21/2018    Time  4    Period  Weeks    Status  Not Met      OT SHORT TERM GOAL #7   Title  Pt to perform environmental scanning at 90% accuracy during ambulation    Time  4    Period  Weeks    Status  On-going      OT SHORT TERM GOAL #8   Title  Pt to demo initiation of proximal movement of UE, scapula and shoulder girdle stability for trunk stabilization with functional ambulation - 03/24/18    Time  4    Period  Weeks    Status  On-going        OT Long Term Goals - 02/25/18 1557      OT LONG TERM GOAL #1   Title  Pt to perform shower transfers at mod I level w/ DME prn and no LOB - goals due 02/18/2018    Time  8    Period  Weeks    Status  Not Met   Pt requires supervision to min assist for safety.      OT LONG TERM GOAL #2   Title  Pt to perform simple meal prep at sup level using A/E and task modifications prn    Time  8    Period  Weeks    Status  Achieved      OT LONG TERM GOAL #3   Title  Pt to perform light house management tasks at sup level including: laundry, washing dishes, cleaning, making bed    Time  8    Period  Weeks    Status  Achieved   per pt report     OT LONG TERM GOAL #4   Title  Pt  to demo 25% finger extension in prep for releasing objects Rt hand     Time  8    Period  Weeks    Status  Not Met    no active finger extension     OT LONG TERM GOAL #5   Title  (LTG #5 moved to STG for renewal period)      OT LONG TERM GOAL #6   Title  Pt to use Rt hand as stabalizer 25% of the time    Time  8    Period  Weeks    Status  Not Met      OT LONG TERM GOAL #7   Title  Pt/family to verbalize understanding with neccessary accommodations, A/E, etc that would be needed if pt is to return to living alone    Time  8    Period  Weeks    Status  Deferred   Pt will not be safe to return to living alone at this time     OT LONG TERM GOAL #8   Title  Pt/family independent with home use of NMES for Rt wrist and finger extension - due 02/26/18    Time  1    Period  Weeks    Status  On-going      OT LONG TERM GOAL  #9   Baseline  Pt independent with water based HEP w/ assist from family prn - due by 04/24/18    Time  8    Period  Weeks    Status  On-going      OT LONG TERM GOAL  #10   TITLE  Pt will improve postural control/alignment for functional mobility to decrease risk of falls during ADLS/IADL activities - due by 04/24/18    Time  8    Period  Weeks    Status  On-going            Plan - 02/25/18 1558    Clinical Impression Statement  Pt continues to make slow progress toward remaining goals.  Pt with increased ease in weight shifting to R during water based activities allowing her to better activate R side of body.     Occupational Profile and client history currently impacting functional performance  no significant PMH but current impairments extensive    Occupational performance deficits (Please refer to evaluation for details):  ADL's;IADL's;Work;Leisure;Social Participation    Rehab Potential  Fair    Current Impairments/barriers affecting progress:  severity of deficits    OT Duration  8 weeks    OT Treatment/Interventions  Aquatic Therapy;Self-care/ADL training;DME and/or AE instruction;Therapeutic activities;Neuromuscular education;Patient/family education;Electrical  Stimulation    Plan  Aquatic therapy to address remaining goals     Consulted and Agree with Plan of Care  Patient    Family Member Consulted  sister brother in law       Patient will benefit from skilled therapeutic intervention in order to improve the following deficits and impairments:  Decreased coordination, Decreased range of motion, Difficulty walking, Improper body mechanics, Decreased endurance, Decreased safety awareness, Impaired sensation, Improper spinal/pelvic alignment, Impaired tone, Decreased knowledge of precautions, Decreased knowledge of use of DME, Decreased balance, Impaired UE functional use, Pain, Decreased cognition, Decreased mobility, Decreased strength, Impaired perceived functional ability, Impaired vision/preception  Visit Diagnosis: Other symptoms and signs involving the nervous system  Unsteadiness on feet  Muscle weakness (generalized)  Hemiplegia and hemiparesis following cerebral infarction affecting right dominant side (HCC)  Other disturbances of skin sensation  Chronic right shoulder pain  Visuospatial deficit  Other symptoms and signs involving cognitive functions following cerebral infarction    Problem List Patient Active Problem List   Diagnosis Date Noted  . Fall 09/17/2017  . Hemiparesis of right dominant side as late effect of cerebral infarction () 09/17/2017  . Shoulder subluxation, right, initial encounter 09/17/2017  . Cardioembolic stroke ([place]) 94/70/9628  . Weakness 09/17/2017  . Aphasia 09/17/2017  . 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 Cardiomyopathy ([facility]) 09/17/2017  . H/O ischemic left MCA stroke 09/17/2017  . Cardiac LV ejection fraction 21-30%   . Graves disease   . Plaque psoriasis   . Neuropathy 05/19/2017    Quay Burow, OTR/L 02/25/2018, 4:02 PM  [facility] [address] [place], [place], [address] Phone:  [phone]   Fax:  [phone]  Name: Margaret Ortiz MRN: [id number] Date of Birth: 06-22-1961

## 2018-02-25 NOTE — Telephone Encounter (Signed)
Copied from [facility] [id number]. Topic: Quick Communication - See Telephone Encounter >> Feb 25, 2018  2:05 PM Bea Graff, NT wrote: CRM for notification. See Telephone encounter for: 02/25/18. Ramona with [facility] calling to see if an order for a speech therapist can go out to see this patients bother-in-law, Mr. Coffman. Patient is had a stroke a few months ago. CB#: [phone]

## 2018-02-26 ENCOUNTER — Encounter: Payer: BLUE CROSS/BLUE SHIELD | Admitting: Speech Pathology

## 2018-02-26 ENCOUNTER — Ambulatory Visit: Payer: BLUE CROSS/BLUE SHIELD | Admitting: Occupational Therapy

## 2018-02-26 ENCOUNTER — Ambulatory Visit: Payer: BLUE CROSS/BLUE SHIELD | Admitting: Rehabilitation

## 2018-02-26 ENCOUNTER — Encounter: Payer: Self-pay | Admitting: Rehabilitation

## 2018-02-26 DIAGNOSIS — M6281 Muscle weakness (generalized): Secondary | ICD-10-CM

## 2018-02-26 DIAGNOSIS — I69351 Hemiplegia and hemiparesis following cerebral infarction affecting right dominant side: Secondary | ICD-10-CM

## 2018-02-26 DIAGNOSIS — R2681 Unsteadiness on feet: Secondary | ICD-10-CM

## 2018-02-26 DIAGNOSIS — R41842 Visuospatial deficit: Secondary | ICD-10-CM

## 2018-02-26 DIAGNOSIS — R29818 Other symptoms and signs involving the nervous system: Secondary | ICD-10-CM

## 2018-02-26 NOTE — Therapy (Signed)
[facility] [address] [place], [place], [address] Phone: [phone]   Fax:  [phone]  Occupational Therapy Treatment  Patient Details  Name: Margaret Ortiz MRN: [id number] Date of Birth: 1960-12-24 Referring Provider: Howard Pouch, DO   Encounter Date: 02/26/2018  OT End of Session - 02/26/18 1152    Visit Number  36    Number of Visits  42    Date for OT Re-Evaluation  04/24/18    Authorization Type  BC/BS    OT Start Time  1100    OT Stop Time  1140    OT Time Calculation (min)  40 min    Activity Tolerance  Patient tolerated treatment well    Behavior During Therapy  [facility] for tasks assessed/performed       Past Medical History:  Diagnosis Date  . Aphasia 09/17/2017  . 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  . Cardiac LV ejection fraction 21-30%   . Cardioembolic stroke ([place]) 70/06/7492   Right hemiparesis  . Cardiomyopathy ([facility]) 04/2017  . Dysphagia 05/19/2017   Last known diet upgraded to regular diet with thin liquids on 06/27/2017, no records available  . 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 Fall 09/17/2017  . Graves disease   . H/O ischemic left MCA stroke 09/17/2017  . Hemiparesis of right dominant side as late effect of cerebral infarction ([facility]) 09/17/2017  . Hyperlipidemia   . Hypertension   . Neuropathy 05/19/2017   s/p stroke  . Plaque psoriasis   . Shoulder subluxation, right, initial encounter 09/17/2017  . Weakness 09/17/2017    Past Surgical History:  Procedure Laterality Date  . NO PAST SURGERIES      There were no vitals filed for this visit.  Subjective Assessment - 02/26/18 1146    Subjective   Thank you     Patient is accompained by:  Family member   brother-n-law   Pertinent History  Lt MCA CVA 05/19/2017, Graves dz    Patient Stated Goals  use my Rt arm    Currently in Pain?  No/denies                   OT Treatments/Exercises (OP) - 02/26/18 0001      ADLs   ADL Comments  Reviewed goals and progress to date. Reviewed use of estim. Pt able to demo setting up electrodes correctly and turning estim unit on, however still required cues for safety when turning intensity up (making sure it is during "on" time to turn up intensity). However, after multiple review, pt could demo I'ly. Pt's brother-n-law reported that she had an episdoe of vomiting after one time using estim, but that was when she was unsupervised using it, and felt it may have been do to turning up intensity during "off" cycle and consequently was too high. Pt has been using regularly since then with no further complications. Denies any seziures. Pt/brother-n-law advised to stop and consult MD if this happens again. They agreed      Visual/Perceptual Exercises   Scanning  Environmental    Scanning - Environmental  Environmental scanning with ambulation finding 10/13 items (77% accuracy) on 1st pass, and finding 2/3 remaining items on 2nd pass. Pt initially missed 2 on Rt and 1 on Lt side.                OT  Short Term Goals - 02/26/18 1154      OT SHORT TERM GOAL #1   Title  Pt/family independent with HEP for RUE - 11/27/17    Status  Achieved      OT SHORT TERM GOAL #2   Title  Independent with splint wear and care for Rt hand prn    Status  Achieved      OT SHORT TERM GOAL #3   Title  Pt/family to verbalize understanding with A/E and task modifications to increase indpendence with ADLS/IADLS (for bathing, tying shoes, cutting food, simple meal prep)    Status  Achieved      OT SHORT TERM GOAL #4   Title  Pt to perform bathing with only min assist and A/E prn while seated     Status  Achieved      OT SHORT TERM GOAL #5   Title  Pt to make sandwich w/ only supervision/cues prn    Status  Achieved      OT SHORT TERM GOAL #6   Title  Pt to demo 25 degrees  shoulder flexion in prep for low level reaching with gross finger flexion - goals due 01/21/2018    Time  4    Period  Weeks    Status  Not Met      OT SHORT TERM GOAL #7   Title  Pt to perform environmental scanning at 90% accuracy during ambulation    Time  4    Period  Weeks    Status  Not Met   77%      OT SHORT TERM GOAL #8   Title  Pt to demo initiation of proximal movement of UE, scapula and shoulder girdle stability for trunk stabilization with functional ambulation - 03/24/18    Time  4    Period  Weeks    Status  On-going        OT Long Term Goals - 02/26/18 1154      OT LONG TERM GOAL #1   Title  Pt to perform shower transfers at mod I level w/ DME prn and no LOB - goals due 02/18/2018    Time  8    Period  Weeks    Status  Not Met   Pt requires supervision to min assist for safety.      OT LONG TERM GOAL #2   Title  Pt to perform simple meal prep at sup level using A/E and task modifications prn    Time  8    Period  Weeks    Status  Achieved      OT LONG TERM GOAL #3   Title  Pt to perform light house management tasks at sup level including: laundry, washing dishes, cleaning, making bed    Time  8    Period  Weeks    Status  Achieved   per pt report     OT LONG TERM GOAL #4   Title  Pt to demo 25% finger extension in prep for releasing objects Rt hand     Time  8    Period  Weeks    Status  Not Met   no active finger extension     OT LONG TERM GOAL #5   Title  (LTG #5 moved to STG for renewal period)      OT LONG TERM GOAL #6   Title  Pt to use Rt hand as stabalizer 25% of the time  Time  8    Period  Weeks    Status  Not Met      OT LONG TERM GOAL #7   Title  Pt/family to verbalize understanding with neccessary accommodations, A/E, etc that would be needed if pt is to return to living alone    Time  8    Period  Weeks    Status  Deferred   Pt will not be safe to return to living alone at this time     OT LONG TERM GOAL #8   Title   Pt/family independent with home use of NMES for Rt wrist and finger extension - due 02/26/18    Time  1    Period  Weeks    Status  Achieved      OT LONG TERM GOAL  #9   Baseline  Pt independent with water based HEP w/ assist from family prn - due by 04/24/18    Time  8    Period  Weeks    Status  On-going      OT LONG TERM GOAL  #10   TITLE  Pt will improve postural control/alignment for functional mobility to decrease risk of falls during ADLS/IADL activities - due by 04/24/18    Time  8    Period  Weeks    Status  On-going            Plan - 02/26/18 1155    Clinical Impression Statement  Pt has met LTG #8. Pt to continue w/ aquatic therapy to address remaining goals    Occupational Profile and client history currently impacting functional performance  no significant PMH but current impairments extensive    Occupational performance deficits (Please refer to evaluation for details):  ADL's;IADL's;Work;Leisure;Social Participation    Rehab Potential  Fair    OT Frequency  1x / week    OT Duration  8 weeks    OT Treatment/Interventions  Aquatic Therapy;Self-care/ADL training;DME and/or AE instruction;Therapeutic activities;Neuromuscular education;Patient/family education;Electrical Stimulation    Plan  Aquatic therapy to address remaining goals (STG #8, and LTG's #9 and #10)    Consulted and Agree with Plan of Care  Patient    Family Member Consulted  brother in law       Patient will benefit from skilled therapeutic intervention in order to improve the following deficits and impairments:  Decreased coordination, Decreased range of motion, Difficulty walking, Improper body mechanics, Decreased endurance, Decreased safety awareness, Impaired sensation, Improper spinal/pelvic alignment, Impaired tone, Decreased knowledge of precautions, Decreased knowledge of use of DME, Decreased balance, Impaired UE functional use, Pain, Decreased cognition, Decreased mobility, Decreased strength,  Impaired perceived functional ability, Impaired vision/preception  Visit Diagnosis: Hemiplegia and hemiparesis following cerebral infarction affecting right dominant side ([facility])  Visuospatial deficit    Problem List Patient Active Problem List   Diagnosis Date Noted  . Fall 09/17/2017  . Hemiparesis of right dominant side as late effect of cerebral infarction ([facility]) 09/17/2017  . Shoulder subluxation, right, initial encounter 09/17/2017  . Cardioembolic stroke ([facility]) 93/81/8299  . Weakness 09/17/2017  . Aphasia 09/17/2017  . 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 Cardiomyopathy ([place]) 09/17/2017  . H/O ischemic left MCA stroke 09/17/2017  . Cardiac LV ejection fraction 21-30%   . Graves disease   . Plaque psoriasis   . Neuropathy 05/19/2017    Carey Bullocks, OTR/L 02/26/2018, 11:58 AM  [facility]  [place], [place], [address] Phone: [phone]   Fax:  [phone]  Name: Eleah Lahaie MRN: [id number] Date of Birth: 25-Jul-1960

## 2018-02-26 NOTE — Therapy (Addendum)
[facility] [address] [place], [place], [address] Phone: [phone]   Fax:  [phone]  Physical Therapy Treatment and Progress note  Patient Details  Name: Margaret Ortiz MRN: [id number] Date of Birth: [birth year] Referring Provider: Howard Pouch, DO   Encounter Date: 02/26/2018  PT End of Session - 02/26/18 0938    Visit Number  30    Number of Visits  41   per updated POC   Date for PT Re-Evaluation  59/16/38   POC/cert for 60 days, will likely use only 30 days   Authorization Type  BCBS     PT Start Time  [phone]    PT Stop Time  1016    PT Time Calculation (min)  43 min    Equipment Utilized During Treatment  --   min guard to Sup - pt refusing gait belt   Activity Tolerance  Patient tolerated treatment well    Behavior During Therapy  [facility] for tasks assessed/performed       Past Medical History:  Diagnosis Date  . Aphasia 09/17/2017  . 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  . Cardiac LV ejection fraction 21-30%   . Cardioembolic stroke ([place]) 99/35/7017   Right hemiparesis  . Cardiomyopathy ([place]) 04/2017  . Dysphagia 05/19/2017   Last known diet upgraded to regular diet with thin liquids on 06/27/2017, no records available  . 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 Fall 09/17/2017  . Graves disease   . H/O ischemic left MCA stroke 09/17/2017  . Hemiparesis of right dominant side as late effect of cerebral infarction ([place]) 09/17/2017  . Hyperlipidemia   . Hypertension   . Neuropathy 05/19/2017   s/p stroke  . Plaque psoriasis   . Shoulder subluxation, right, initial encounter 09/17/2017  . Weakness 09/17/2017    Past Surgical History:  Procedure Laterality Date  . NO PAST SURGERIES      There were no vitals filed for this visit.  Subjective Assessment - 02/26/18 0938    Subjective  Pt  reports doing well, some back pain this morning but doesn't rate.  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      Currently in Pain?  No/denies                       [facility] Adult PT Treatment/Exercise - 02/26/18 0001      Ambulation/Gait   Ambulation/Gait  Yes    Ambulation/Gait Assistance  5: Supervision;4: Min guard    Ambulation/Gait Assistance Details  Continue to address gait for improved quality within session between tasks with SPC and AFO.  Pt is doing very well maintaining increased gait speed, however continues to hike R hip during swing and not allow for more automatic R knee flex therefore provided continuous cues and light tactile cues to drop R hip at end of stance to allow knee flex and improved R LE clearance with less hip hike.      Ambulation Distance (Feet)  300 Feet    Assistive device  Straight cane    Gait Pattern  Decreased stance time - right;Decreased step length - left;Decreased stride length;Decreased hip/knee flexion - right;Decreased dorsiflexion -  right;Decreased weight shift to right;Right circumduction;Right hip hike;Lateral hip instability;Poor foot clearance - right    Ambulation Surface  Level;Indoor    Stairs  Yes    Stairs Assistance  6: Modified independent (Device/Increase time);5: Supervision    Stairs Assistance Details (indicate cue type and reason)  Performed stairs several reps during session to address LTG.  PT misunderstood goal and had pt negotiate up/down 12 steps with R rail (this is how pt reports set up is at home).  She is able to ascend sideways and descend forwards at mod I level, however when readdressed without cane x 12 steps, PT would recommend S in this fashion.      Stair Management Technique  One rail Right;No rails;Step to  pattern;Forwards;Sideways;With cane    Number of Stairs  12   x 2 reps   Height of Stairs  6      Neuro Re-ed    Neuro Re-ed Details   Went over current HEP during session with pt performing:  Hooklying RLE marching x 20 reps (with cues for slower speed and more controlled movement), BLE bridging x 10 reps>PT cuing to try RLE only bridging with improved RLE control (note that this is already in HEP), R SLR x 5 rpes, however note marked ER therefore D/C'd this exercise, lower trunk rotation x 10 reps (again cues for slower speed and improved control), hooklying hip abd with yellow band x 10 reps (cues for control)             PT Education - 02/26/18 1242    Education provided  Yes    Education Details  Progress based on goals and renewal for 4 more weeks    Person(s) Educated  Patient;Caregiver(s)    Methods  Explanation    Comprehension  Verbalized understanding       PT Short Term Goals - 02/26/18 0945      PT SHORT TERM GOAL #1   Title  The patient will ambulate on level community surfaces with SPC mod indep for improved mobility.    Baseline  Using [facility] in community for limited surfaces.    Time  4    Period  Weeks    Status  Achieved      PT SHORT TERM GOAL #2   Title  The patient will improve gait speed from 0.85 ft/sec to > or equal to 1.2 ft/sec to demonstrate transition from "household ambulator" to "limited community ambulator" classification of gait.    Baseline  1.17 ft/sec on 01/28/18    Time  4    Period  Weeks    Status  Partially Met      PT SHORT TERM GOAL #3   Title  The patient will improve Berg balance score from 42/56 to > or equal to 45/56 to demo dec'ing risk for falls.    Baseline  Berg=45/56 on 01/28/18    Time  4    Period  Weeks    Status  Achieved      PT SHORT TERM GOAL #4   Title  The patient will ambulate x 600 ft nonstop to demo improved endurance for functional gait.    Baseline  Patient able to ambulate nonstop x 600 ft mod indep on level  surfaces with SPc.     Time  4    Period  Weeks    Status  Achieved        PT Long Term Goals - 02/26/18 9449  PT LONG TERM GOAL #1   Title  The patient will perform updated HEP with intermittent assist for post d/c progression of exercise.    Baseline  Patient is doing prescribed exercises and walking as well as aquatic therapy     Time  4    Period  Weeks    Status  On-going    Target Date  03/28/18      PT LONG TERM GOAL #2   Title  The patient will move floor<>stand with mod indep using L UE support on surface due to h/o falls.    Baseline  floor to stand at S level with cues needed for sequencing 02/26/18    Time  4    Period  Weeks    Status  On-going    Target Date  03/28/18      PT LONG TERM GOAL #3   Title  The patient will improve gait speed from 0.85 ft/sec to > or equal to 2.5 ft/sec to demo dec'ing risk for falls.    Baseline  0.98 ft.sec on 12/22/2017 improved to 1.95 ft/sec on 02/19/18.    Time  8    Period  Weeks    Status  Revised    Target Date  03/28/18      PT LONG TERM GOAL #4   Title  The patient will improve Berg score from 49/56 (on 02/25/2018) up to 52/56 to demo dec'ing risk for falls.    Baseline  Berg (02/25/18) 49/56    Time  8    Period  Weeks    Status  Revised    Target Date  03/28/18      PT LONG TERM GOAL #5   Title  The patient will negotiate 12 steps with a handrail (light support only)with reciprocal pattern mod indep to access home entries.    Baseline  one handrail mod indep with step to pattern 02/25/18, no handrail cane only at S level 02/26/18    Time  8    Period  Weeks    Status  Revised    Target Date  03/28/18            Plan - 02/26/18 1244    Clinical Impression Statement  Session addressed remaining LTGs.  Note pt is making continued progress with balance, gait and overall mobility, therefore will plan to renew for 60 days with liklihood of D/C following 4 weeks.      Rehab Potential  Good    Clinical Impairments  Affecting Rehab Potential  Patient motivated to participate,  aphasia continues to be a barrier    PT Frequency  2x / week    PT Duration  8 weeks    PT Treatment/Interventions  ADLs/Self Care Home Management;Gait training;Stair training;Functional mobility training;Therapeutic activities;Therapeutic exercise;Balance training;Neuromuscular re-education;Patient/family education;Orthotic Fit/Training;Electrical Stimulation;Manual techniques;DME Instruction    PT Next Visit Plan  Treadmill to work on gait speed (push at faster speeds as tolerable), (pool for aquatic PT?)high level balance, Tall kneeling for right hip activation, R LE strengthening. work on knee flexion during gait.  Patient may be interested in community wellness/ clarify with family if they have access to gym.      PT Home Exercise Plan  MAKE SURE SHE KNOWS SHE HAS OT FOR POOL ON 9/25 AND 10/2 AT 1:00 AS THESE WERE ADDED BY KAREN.          Consulted and Agree with Plan of Care  Patient  Patient will benefit from skilled therapeutic intervention in order to improve the following deficits and impairments:  Abnormal gait, Impaired sensation, Pain, Postural dysfunction, Impaired tone, Decreased mobility, Decreased coordination, Decreased activity tolerance, Decreased endurance, Decreased strength, Difficulty walking, Decreased balance  Visit Diagnosis: Other symptoms and signs involving the nervous system - Plan: PT plan of care cert/re-cert  Unsteadiness on feet - Plan: PT plan of care cert/re-cert  Muscle weakness (generalized) - Plan: PT plan of care cert/re-cert  Hemiplegia and hemiparesis following cerebral infarction affecting right dominant side ([place]) - Plan: PT plan of care cert/re-cert    Progress Note Reporting Period 01/16/18 to 02/26/18  See note below for Objective Data and Assessment of Progress/Goals.       Problem List Patient Active Problem List   Diagnosis Date Noted  . Fall 09/17/2017  . Hemiparesis  of right dominant side as late effect of cerebral infarction ([place]) 09/17/2017  . Shoulder subluxation, right, initial encounter 09/17/2017  . Cardioembolic stroke ([place]) 58/30/9407  . Weakness 09/17/2017  . Aphasia 09/17/2017  . 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 Cardiomyopathy ([place]) 09/17/2017  . H/O ischemic left MCA stroke 09/17/2017  . Cardiac LV ejection fraction 21-30%   . Graves disease   . Plaque psoriasis   . Neuropathy 05/19/2017   Cameron Sprang, PT, MPT [facility] [address] [place], [place], [address] Phone: [phone]   Fax:  [phone] 02/26/18, 2:50 PM  Name: Margaret Ortiz MRN: [id number] Date of Birth: [birth year]

## 2018-03-02 ENCOUNTER — Encounter: Payer: BLUE CROSS/BLUE SHIELD | Admitting: Speech Pathology

## 2018-03-02 ENCOUNTER — Encounter: Payer: BLUE CROSS/BLUE SHIELD | Admitting: Occupational Therapy

## 2018-03-02 ENCOUNTER — Encounter: Payer: Self-pay | Admitting: Rehabilitation

## 2018-03-02 ENCOUNTER — Ambulatory Visit: Payer: BLUE CROSS/BLUE SHIELD | Admitting: Rehabilitation

## 2018-03-02 DIAGNOSIS — R29818 Other symptoms and signs involving the nervous system: Secondary | ICD-10-CM | POA: Diagnosis not present

## 2018-03-02 DIAGNOSIS — I69351 Hemiplegia and hemiparesis following cerebral infarction affecting right dominant side: Secondary | ICD-10-CM

## 2018-03-02 DIAGNOSIS — R2681 Unsteadiness on feet: Secondary | ICD-10-CM

## 2018-03-02 DIAGNOSIS — M6281 Muscle weakness (generalized): Secondary | ICD-10-CM

## 2018-03-02 DIAGNOSIS — R2689 Other abnormalities of gait and mobility: Secondary | ICD-10-CM

## 2018-03-02 NOTE — Therapy (Signed)
[facility] [address] [place], [place], [address] Phone: [phone]   Fax:  [phone]  Physical Therapy Treatment  Patient Details  Name: Margaret Ortiz MRN: [id number] Date of Birth: December 23, 1960 Referring Provider: Howard Pouch, DO   Encounter Date: 03/02/2018  PT End of Session - 03/02/18 1503    Visit Number  31    Number of Visits  41   per updated POC   Date for PT Re-Evaluation  70/17/79   POC/cert for 60 days, will likely use only 30 days   Authorization Type  BCBS     PT Start Time  1148    PT Stop Time  1232    PT Time Calculation (min)  44 min    Equipment Utilized During Treatment  --   min guard to Sup - pt refusing gait belt   Activity Tolerance  Patient tolerated treatment well    Behavior During Therapy  [facility] for tasks assessed/performed       Past Medical History:  Diagnosis Date  . Aphasia 09/17/2017  . 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  . Cardiac LV ejection fraction 21-30%   . Cardioembolic stroke ([place]) 39/08/90   Right hemiparesis  . Cardiomyopathy ([place]) 04/2017  . Dysphagia 05/19/2017   Last known diet upgraded to regular diet with thin liquids on 06/27/2017, no records available  . 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 Fall 09/17/2017  . Graves disease   . H/O ischemic left MCA stroke 09/17/2017  . Hemiparesis of right dominant side as late effect of cerebral infarction ([place]) 09/17/2017  . Hyperlipidemia   . Hypertension   . Neuropathy 05/19/2017   s/p stroke  . Plaque psoriasis   . Shoulder subluxation, right, initial encounter 09/17/2017  . Weakness 09/17/2017    Past Surgical History:  Procedure Laterality Date  . NO PAST SURGERIES      There were no vitals filed for this visit.  Subjective Assessment - 03/02/18 1259    Subjective  Pt presents wanting to work  on R knee flex. No other changes.     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      Currently in Pain?  No/denies                       [facility] Adult PT Treatment/Exercise - 03/02/18 1150      Ambulation/Gait   Ambulation/Gait  Yes    Ambulation/Gait Assistance  5: Supervision;4: Min guard    Ambulation/Gait Assistance Details  Continue to address gait with SPC for improved quality and speed with emphasis on dropping R hip during end of stance phase of gait for more automatic R knee flex into swing phase of gait to reduce R hip hike and circumduction.  Pt still needs intermittent tactile cuing at R hip/pelvis for dropping hip, she also does well when slowing gait speed down to get technique then PT working to speed up whle maintaining quality.  Also note that she tends to take larger step on R LE to ensure clearance and therefore demo's increased trunk extension.  With continued cues for smaller R step with maintaining gait  speed, she did VERY well.      Ambulation Distance (Feet)  500 Feet    Assistive device  Straight cane    Gait Pattern  Decreased stance time - right;Decreased step length - left;Decreased stride length;Decreased hip/knee flexion - right;Decreased dorsiflexion - right;Decreased weight shift to right;Right circumduction;Right hip hike;Lateral hip instability;Poor foot clearance - right    Ambulation Surface  Level;Indoor    Stairs  Yes    Stairs Assistance  4: Min guard;4: Min assist    Stairs Assistance Details (indicate cue type and reason)  Worked on stairs with reciprocal pattern with L handrail.  Pt needs light assist for placement of RLE (with tactile cues for R knee flex) but is able to ascend well on RLE.  She does require more UE support when descending, but  also is somewhat limited by AFO.  Will continue to practice.     Stair Management Technique  One rail Left;Alternating pattern;Forwards    Number of Stairs  8    Height of Stairs  6      Neuro Re-ed    Neuro Re-ed Details   In // bars working on improving R hip drop at end of stance phase for more automatic R knee flex and RLE clearance during swing.  Performed standing weight shift with LUE support initially shifting forward onto LLE, dropping R hip and flexing R knee x 10 reps, progressing to weight shift as before with actual advancement of RLE x 10 reps.  PT added shoe cover for R shoe to decrease friction and have pt "do as little as possible" to clear R LE.  This seemed to carryover today better than previous sessions.  Performed another 10 reps progressing to forward gait in // bars x 4 reps before going out of // bars with SPC as mentioned in gait section.  Also worked on RLE strength and ability to power through RLE to ascend stairs.  Had pt step up on 4" step with LUE support (removed AFO to increase ROM).  Eventually placed pts LUE over her chest to decrease UE support and she was able to complete in controlled manner following 10-15 reps.  Progressed to having her advance LLE up onto step then down to ground for increased RLE control.  Had her decrease UE support as able x 15 reps.  Then progressed to stairs as mentioned above.               PT Education - 03/02/18 1259    Education provided  Yes    Education Details  Additional OT pool sessions to cover PT plan of care.     Person(s) Educated  Patient;Caregiver(s)    Methods  Explanation    Comprehension  Verbalized understanding       PT Short Term Goals - 02/26/18 0945      PT SHORT TERM GOAL #1   Title  The patient will ambulate on level community surfaces with SPC mod indep for improved mobility.    Baseline  Using [facility] in community for limited surfaces.    Time  4    Period  Weeks    Status  Achieved      PT SHORT  TERM GOAL #2   Title  The patient will improve gait speed from 0.85 ft/sec to > or equal to 1.2 ft/sec to demonstrate transition from "household ambulator" to "limited community ambulator" classification of gait.    Baseline  1.17 ft/sec on 01/28/18  Time  4    Period  Weeks    Status  Partially Met      PT SHORT TERM GOAL #3   Title  The patient will improve Berg balance score from 42/56 to > or equal to 45/56 to demo dec'ing risk for falls.    Baseline  Berg=45/56 on 01/28/18    Time  4    Period  Weeks    Status  Achieved      PT SHORT TERM GOAL #4   Title  The patient will ambulate x 600 ft nonstop to demo improved endurance for functional gait.    Baseline  Patient able to ambulate nonstop x 600 ft mod indep on level surfaces with SPc.     Time  4    Period  Weeks    Status  Achieved        PT Long Term Goals - 02/26/18 0945      PT LONG TERM GOAL #1   Title  The patient will perform updated HEP with intermittent assist for post d/c progression of exercise.    Baseline  Patient is doing prescribed exercises and walking as well as aquatic therapy     Time  4    Period  Weeks    Status  On-going    Target Date  03/28/18      PT LONG TERM GOAL #2   Title  The patient will move floor<>stand with mod indep using L UE support on surface due to h/o falls.    Baseline  floor to stand at S level with cues needed for sequencing 02/26/18    Time  4    Period  Weeks    Status  On-going    Target Date  03/28/18      PT LONG TERM GOAL #3   Title  The patient will improve gait speed from 0.85 ft/sec to > or equal to 2.5 ft/sec to demo dec'ing risk for falls.    Baseline  0.98 ft.sec on 12/22/2017 improved to 1.95 ft/sec on 02/19/18.    Time  8    Period  Weeks    Status  Revised    Target Date  03/28/18      PT LONG TERM GOAL #4   Title  The patient will improve Berg score from 49/56 (on 02/25/2018) up to 52/56 to demo dec'ing risk for falls.    Baseline  Berg (02/25/18) 49/56    Time   8    Period  Weeks    Status  Revised    Target Date  03/28/18      PT LONG TERM GOAL #5   Title  The patient will negotiate 12 steps with a handrail (light support only)with reciprocal pattern mod indep to access home entries.    Baseline  one handrail mod indep with step to pattern 02/25/18, no handrail cane only at S level 02/26/18    Time  8    Period  Weeks    Status  Revised    Target Date  03/28/18            Plan - 03/02/18 1504    Clinical Impression Statement  Skilled session continues to focus on NMR and gait for improved quality with emphasis on improved R knee flex to avoid R hip hike and circumduction along with maintaining gait speed.  Also began to address stairs/step ups using RLE as power leg.      Rehab  Potential  Good    Clinical Impairments Affecting Rehab Potential  Patient motivated to participate,  aphasia continues to be a barrier    PT Frequency  2x / week    PT Duration  8 weeks    PT Treatment/Interventions  ADLs/Self Care Home Management;Gait training;Stair training;Functional mobility training;Therapeutic activities;Therapeutic exercise;Balance training;Neuromuscular re-education;Patient/family education;Orthotic Fit/Training;Electrical Stimulation;Manual techniques;DME Instruction    PT Next Visit Plan  Treadmill to work on gait speed (push at faster speeds as tolerable), (pool for aquatic PT?)high level balance, Tall kneeling for right hip activation, R LE strengthening. work on knee flexion during gait.  Patient may be interested in community wellness/ clarify with family if they have access to gym.      Consulted and Agree with Plan of Care  Patient       Patient will benefit from skilled therapeutic intervention in order to improve the following deficits and impairments:  Abnormal gait, Impaired sensation, Pain, Postural dysfunction, Impaired tone, Decreased mobility, Decreased coordination, Decreased activity tolerance, Decreased endurance, Decreased  strength, Difficulty walking, Decreased balance  Visit Diagnosis: Hemiplegia and hemiparesis following cerebral infarction affecting right dominant side ([facility])  Unsteadiness on feet  Muscle weakness (generalized)  Other abnormalities of gait and mobility     Problem List Patient Active Problem List   Diagnosis Date Noted  . Fall 09/17/2017  . Hemiparesis of right dominant side as late effect of cerebral infarction ([place]) 09/17/2017  . Shoulder subluxation, right, initial encounter 09/17/2017  . Cardioembolic stroke ([place]) 43/88/8757  . Weakness 09/17/2017  . Aphasia 09/17/2017  . 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 Cardiomyopathy ([place]) 09/17/2017  . H/O ischemic left MCA stroke 09/17/2017  . Cardiac LV ejection fraction 21-30%   . Graves disease   . Plaque psoriasis   . Neuropathy 05/19/2017    Cameron Sprang, PT, MPT [facility] [address] [place], [place], [address] Phone: [phone]   Fax:  [phone] 03/02/18, 3:05 PM  Name: Margaret Ortiz MRN: [id number] Date of Birth: 21-Sep-1960

## 2018-03-05 ENCOUNTER — Encounter: Payer: BLUE CROSS/BLUE SHIELD | Admitting: Speech Pathology

## 2018-03-05 ENCOUNTER — Encounter: Payer: Self-pay | Admitting: Rehabilitation

## 2018-03-05 ENCOUNTER — Encounter: Payer: BLUE CROSS/BLUE SHIELD | Admitting: Occupational Therapy

## 2018-03-05 ENCOUNTER — Ambulatory Visit: Payer: BLUE CROSS/BLUE SHIELD | Admitting: Rehabilitation

## 2018-03-05 DIAGNOSIS — I69351 Hemiplegia and hemiparesis following cerebral infarction affecting right dominant side: Secondary | ICD-10-CM

## 2018-03-05 DIAGNOSIS — R29818 Other symptoms and signs involving the nervous system: Secondary | ICD-10-CM | POA: Diagnosis not present

## 2018-03-05 DIAGNOSIS — M6281 Muscle weakness (generalized): Secondary | ICD-10-CM

## 2018-03-05 DIAGNOSIS — R2681 Unsteadiness on feet: Secondary | ICD-10-CM

## 2018-03-05 DIAGNOSIS — R2689 Other abnormalities of gait and mobility: Secondary | ICD-10-CM

## 2018-03-05 NOTE — Therapy (Signed)
[facility] [address] [place], [place], [address] Phone: [phone]   Fax:  [phone]  Physical Therapy Treatment  Patient Details  Name: Margaret Ortiz MRN: [id number] Date of Birth: 05/31/1961 Referring Provider: Howard Pouch, DO   Encounter Date: 03/05/2018  PT End of Session - 03/05/18 1108    Visit Number  31    Number of Visits  41   per updated POC   Date for PT Re-Evaluation  83/72/90   POC/cert for 60 days, will likely use only 30 days   Authorization Type  BC[employer]     PT Start Time  1100    PT Stop Time  1155    PT Time Calculation (min)  55 min    Equipment Utilized During Treatment  --   min guard to Sup - pt refusing gait belt   Activity Tolerance  Patient tolerated treatment well    Behavior During Therapy  WFL for tasks assessed/performed       Past Medical History:  Diagnosis Date  . Aphasia 09/17/2017  . 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  . Cardiac LV ejection fraction 21-30%   . Cardioembolic stroke ([place]) 21/04/5519   Right hemiparesis  . Cardiomyopathy ([place]) 04/2017  . Dysphagia 05/19/2017   Last known diet upgraded to regular diet with thin liquids on 06/27/2017, no records available  . 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 Fall 09/17/2017  . Graves disease   . H/O ischemic left MCA stroke 09/17/2017  . Hemiparesis of right dominant side as late effect of cerebral infarction ([place]) 09/17/2017  . Hyperlipidemia   . Hypertension   . Neuropathy 05/19/2017   s/p stroke  . Plaque psoriasis   . Shoulder subluxation, right, initial encounter 09/17/2017  . Weakness 09/17/2017    Past Surgical History:  Procedure Laterality Date  . NO PAST SURGERIES      There were no vitals filed for this visit.  Subjective Assessment - 03/05/18 1105    Subjective  No changes, no pain today.       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      Currently in Pain?  No/denies            Gait training:  On treadmill, worked up to 1.8 mph from 1.4 mph with LUE support in body weight support vest (not supported initially, but during last bout PT did increase support on L side of harness to increase R lateral weight shift).  Pt initially doing well taking smaller step on RLE with less R hip hike however R foot was "scuffing" and catching at times, therefore PT donned shoe cover over whole shoe to decrease friction/catching.  Tactile facilitation varied throughout session from pelvis and R knee for improved R forward hip protraction in stance, improved R knee flex during swing, light feedback when advancing RLE to decrease force and step length on RLE, and at trunk to prevent trunk extension during swing phase of gait.  Pt able to tolerate longer bouts of gait today with short rest periods in between.  Performed 9 minutes total (2 mins, standing rest break, 4 mins, 3 mins).  Pt then  ambulated x 400' around track (250' with [facility] and remainder with R HHA only-PT facing pt).  Again, note good carryover when over indoor surface with decreased trunk compensation, decreased hip hike however still needs cues for improved R hip drop and decreased R step length.     NMR: While standing at counter top with LUE support, R foot placed on pillow case, moving RLE into flexion<>extension x 2 sets of 10 reps keeping R foot on pillowcase to decrease R hip hike.  Attempted with and without knee flex, but was difficult for pt to consistently perform without elevating foot from floor.                    PT Education - 03/05/18 1107    Education provided  Yes    Education Details  continued purpose of  treadmill use    Person(s) Educated  Patient    Methods  Explanation    Comprehension  Verbalized understanding       PT Short Term Goals - 02/26/18 0945      PT SHORT TERM GOAL #1   Title  The patient will ambulate on level community surfaces with SPC mod indep for improved mobility.    Baseline  Using [facility] in community for limited surfaces.    Time  4    Period  Weeks    Status  Achieved      PT SHORT TERM GOAL #2   Title  The patient will improve gait speed from 0.85 ft/sec to > or equal to 1.2 ft/sec to demonstrate transition from "household ambulator" to "limited community ambulator" classification of gait.    Baseline  1.17 ft/sec on 01/28/18    Time  4    Period  Weeks    Status  Partially Met      PT SHORT TERM GOAL #3   Title  The patient will improve Berg balance score from 42/56 to > or equal to 45/56 to demo dec'ing risk for falls.    Baseline  Berg=45/56 on 01/28/18    Time  4    Period  Weeks    Status  Achieved      PT SHORT TERM GOAL #4   Title  The patient will ambulate x 600 ft nonstop to demo improved endurance for functional gait.    Baseline  Patient able to ambulate nonstop x 600 ft mod indep on level surfaces with SPc.     Time  4    Period  Weeks    Status  Achieved        PT Long Term Goals - 02/26/18 0945      PT LONG TERM GOAL #1   Title  The patient will perform updated HEP with intermittent assist for post d/c progression of exercise.    Baseline  Patient is doing prescribed exercises and walking as well as aquatic therapy     Time  4    Period  Weeks    Status  On-going    Target Date  03/28/18      PT LONG TERM GOAL #2   Title  The patient will move floor<>stand with mod indep using L UE support on surface due to h/o falls.    Baseline  floor to stand at S level with cues needed for sequencing 02/26/18    Time  4    Period  Weeks    Status  On-going    Target Date  03/28/18  PT LONG TERM GOAL #3   Title  The patient will improve  gait speed from 0.85 ft/sec to > or equal to 2.5 ft/sec to demo dec'ing risk for falls.    Baseline  0.98 ft.sec on 12/22/2017 improved to 1.95 ft/sec on 02/19/18.    Time  8    Period  Weeks    Status  Revised    Target Date  03/28/18      PT LONG TERM GOAL #4   Title  The patient will improve Berg score from 49/56 (on 02/25/2018) up to 52/56 to demo dec'ing risk for falls.    Baseline  Berg (02/25/18) 49/56    Time  8    Period  Weeks    Status  Revised    Target Date  03/28/18      PT LONG TERM GOAL #5   Title  The patient will negotiate 12 steps with a handrail (light support only)with reciprocal pattern mod indep to access home entries.    Baseline  one handrail mod indep with step to pattern 02/25/18, no handrail cane only at S level 02/26/18    Time  8    Period  Weeks    Status  Revised    Target Date  03/28/18            Plan - 03/05/18 1200    Clinical Impression Statement  Skilled session focused on gait with use of body weight support over treadmill with carryover on level indoor surfaces with and without cane to continue to emphasize R hip protraction during stance, R hip drop for improved R knee flex during end of stance/beginning of swing.  Pt demos improved ability to take smaller step on RLE causing less circumduction and trunk extension.      Rehab Potential  Good    Clinical Impairments Affecting Rehab Potential  Patient motivated to participate,  aphasia continues to be a barrier    PT Frequency  2x / week    PT Duration  8 weeks    PT Treatment/Interventions  ADLs/Self Care Home Management;Gait training;Stair training;Functional mobility training;Therapeutic activities;Therapeutic exercise;Balance training;Neuromuscular re-education;Patient/family education;Orthotic Fit/Training;Electrical Stimulation;Manual techniques;DME Instruction    PT Next Visit Plan  Treadmill to work on gait speed (push at faster speeds as tolerable), (pool for aquatic PT?)high level balance,  Tall kneeling for right hip activation, R LE strengthening. work on knee flexion during gait.  Patient may be interested in community wellness/ clarify with family if they have access to gym.      Consulted and Agree with Plan of Care  Patient       Patient will benefit from skilled therapeutic intervention in order to improve the following deficits and impairments:  Abnormal gait, Impaired sensation, Pain, Postural dysfunction, Impaired tone, Decreased mobility, Decreased coordination, Decreased activity tolerance, Decreased endurance, Decreased strength, Difficulty walking, Decreased balance  Visit Diagnosis: Hemiplegia and hemiparesis following cerebral infarction affecting right dominant side (HCC)  Unsteadiness on feet  Muscle weakness (generalized)  Other abnormalities of gait and mobility     Problem List Patient Active Problem List   Diagnosis Date Noted  . Fall 09/17/2017  . Hemiparesis of right dominant side as late effect of cerebral infarction ([place]) 09/17/2017  . Shoulder subluxation, right, initial encounter 09/17/2017  . Cardioembolic stroke ([place]) 83/15/1761  . Weakness 09/17/2017  . Aphasia 09/17/2017  . 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 Cardiomyopathy ([place]) 09/17/2017  . H/O ischemic left MCA stroke  09/17/2017  . Cardiac LV ejection fraction 21-30%   . Graves disease   . Plaque psoriasis   . Neuropathy 05/19/2017   Cameron Sprang, PT, MPT [facility] [address] [place], [place], [address] Phone: [phone]   Fax:  [phone] 03/05/18, 1:03 PM  Name: Breda Bond MRN: [id number] Date of Birth: [birth year]

## 2018-03-09 ENCOUNTER — Encounter: Payer: BLUE CROSS/BLUE SHIELD | Admitting: Occupational Therapy

## 2018-03-09 ENCOUNTER — Other Ambulatory Visit: Payer: Self-pay | Admitting: *Deleted

## 2018-03-09 MED ORDER — CLOBETASOL PROPIONATE 0.05 % EX CREA: 1.0000 "application " | TOPICAL_CREAM | Freq: Two times a day (BID) | CUTANEOUS | 0 refills | Status: DC

## 2018-03-10 ENCOUNTER — Encounter: Payer: Self-pay | Admitting: Rehabilitation

## 2018-03-10 ENCOUNTER — Ambulatory Visit: Payer: BLUE CROSS/BLUE SHIELD | Admitting: Rehabilitation

## 2018-03-10 DIAGNOSIS — R2689 Other abnormalities of gait and mobility: Secondary | ICD-10-CM

## 2018-03-10 DIAGNOSIS — R29818 Other symptoms and signs involving the nervous system: Secondary | ICD-10-CM | POA: Diagnosis not present

## 2018-03-10 DIAGNOSIS — I69351 Hemiplegia and hemiparesis following cerebral infarction affecting right dominant side: Secondary | ICD-10-CM

## 2018-03-10 DIAGNOSIS — M6281 Muscle weakness (generalized): Secondary | ICD-10-CM

## 2018-03-10 DIAGNOSIS — R2681 Unsteadiness on feet: Secondary | ICD-10-CM

## 2018-03-10 NOTE — Therapy (Signed)
[facility] [address] [place], [place], [address] Phone: [phone]   Fax:  [phone]  Physical Therapy Treatment  Patient Details  Name: Margaret Ortiz MRN: [id number] Date of Birth: [birth year] Referring Provider: Howard Pouch, DO   Encounter Date: 03/10/2018  PT End of Session - 03/10/18 1501    Visit Number  32    Number of Visits  41   per updated POC   Date for PT Re-Evaluation  56/25/63   POC/cert for 60 days, will likely use only 30 days   Authorization Type  BCBS     PT Start Time  1104    PT Stop Time  1147    PT Time Calculation (min)  43 min    Equipment Utilized During Treatment  --   min guard to Sup - pt refusing gait belt   Activity Tolerance  Patient tolerated treatment well    Behavior During Therapy  WFL for tasks assessed/performed       Past Medical History:  Diagnosis Date  . Aphasia 09/17/2017  . 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  . Cardiac LV ejection fraction 21-30%   . Cardioembolic stroke ([place]) 89/37/3428   Right hemiparesis  . Cardiomyopathy ([place]) 04/2017  . Dysphagia 05/19/2017   Last known diet upgraded to regular diet with thin liquids on 06/27/2017, no records available  . 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 Fall 09/17/2017  . Graves disease   . H/O ischemic left MCA stroke 09/17/2017  . Hemiparesis of right dominant side as late effect of cerebral infarction ([place]) 09/17/2017  . Hyperlipidemia   . Hypertension   . Neuropathy 05/19/2017   s/p stroke  . Plaque psoriasis   . Shoulder subluxation, right, initial encounter 09/17/2017  . Weakness 09/17/2017    Past Surgical History:  Procedure Laterality Date  . NO PAST SURGERIES      There were no vitals filed for this visit.  Subjective Assessment - 03/10/18 1117    Subjective  Pt reports she hurt foot  doing exercise (sounds like doing straight leg raise with strap).  Is better but still sore.     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      Currently in Pain?  Yes    Pain Score  5     Pain Location  Foot    Pain Orientation  Right    Pain Descriptors / Indicators  Aching;Sore    Pain Type  Acute pain    Pain Onset  In the past 7 days    Pain Frequency  Intermittent    Aggravating Factors   walking, exercise with strap    Pain Relieving Factors  CBD oil                       [facility] Adult PT Treatment/Exercise - 03/10/18 1105      Ambulation/Gait   Ambulation/Gait  Yes    Ambulation/Gait Assistance  5: Supervision;4: Min guard    Ambulation/Gait Assistance Details  Min/guard for facilitation.  Had pt ambulate with cane during session, slowly down gait speed intermittently to provide tactile cues at pelvis at end of stance phase of RLE to drop hip and  decrease amount of force/muscle contraction at hip/pelvis used to clear RLE.  Pt able to return demo nicely during session, however has difficulty maintaining this and tends to "throw" R leg out into full extension when advancing RLE.      Ambulation Distance (Feet)  300 Feet    Assistive device  Straight cane    Gait Pattern  Decreased stance time - right;Decreased step length - left;Decreased stride length;Decreased hip/knee flexion - right;Decreased dorsiflexion - right;Decreased weight shift to right;Right circumduction;Right hip hike;Lateral hip instability;Poor foot clearance - right    Ambulation Surface  Level;Indoor    Stairs  Yes    Stairs Assistance  4: Min guard;5: Supervision;4: Min assist    Stairs Assistance Details (indicate cue type and reason)  Continue to address stairs working on reciprocal pattern with emphasis on leading with RLE with light UE support.   Pt able to perform stairs with LUE support at S level with min cues for RLE placement to ensure she can fully power up through RLE.  She is also able to descend in reciprocal pattern but does need increased UE support.  Progressed to having pt go up/down stairs with HHA from PT only in order to decrease amount of UE support.      Stair Management Technique  One rail Left;Alternating pattern;Forwards    Number of Stairs  12    Height of Stairs  6      Self-Care   Self-Care  Other Self-Care Comments    Other Self-Care Comments   Pt presents today with increased R low back pain and tightness.  Feel that it is likely QL tightness along with multifidus tightness, therefore had pt perform forward trunk flexion to L foot with pt reporting this was good stretch.  Also performed child's pose stretch to the L for R low back.  Pt also reports this works well.  Had her perform on mat and then on floor as she will likely have to do on floor at home.  Pt able to keep brace/shoe donned but reports she would probably remove shoe/brace at home for improved comfort.  Performed each stretch x 30 secs.   Also briefly assessed R foot pain at beginning of session.  Note tenderness to palpation over proximal metatarsals 2-5 and slight swelling however was unable to produce pain with flex/extension during session.  Pt reports pain is getting better and that she is using CBD oil.  Also recommended ice, esp after walking longer distances.  Pt and brother in law verbalized understanding.        Neuro Re-ed    Neuro Re-ed Details   Had pt work on sit<>stand and standing with LLE on 2" step with yellow bubble disc to decrease support and ensure stabilization through RLE.  Pt did well, however was still able to push through LLE for support.  Transitioned to having pt stand and place LLE on 14" step maintaining balance with intermittent UE support but she was finally able to stand without UE support.  Verbal and tactile cues for  improved posterior weight shift onto RLE, improved R hip protraction and knee extension along with decreased trunk rotation to the R.  Pt also able to tap L foot x 15 reps with only very light UE support.               PT Education - 03/10/18 1459    Education provided  Yes    Education Details  stretches for R low  back, education on use of ice for foot pain    Person(s) Educated  Patient;Caregiver(s)    Methods  Explanation;Demonstration    Comprehension  Verbalized understanding;Returned demonstration       PT Short Term Goals - 02/26/18 0945      PT SHORT TERM GOAL #1   Title  The patient will ambulate on level community surfaces with SPC mod indep for improved mobility.    Baseline  Using [facility] in community for limited surfaces.    Time  4    Period  Weeks    Status  Achieved      PT SHORT TERM GOAL #2   Title  The patient will improve gait speed from 0.85 ft/sec to > or equal to 1.2 ft/sec to demonstrate transition from "household ambulator" to "limited community ambulator" classification of gait.    Baseline  1.17 ft/sec on 01/28/18    Time  4    Period  Weeks    Status  Partially Met      PT SHORT TERM GOAL #3   Title  The patient will improve Berg balance score from 42/56 to > or equal to 45/56 to demo dec'ing risk for falls.    Baseline  Berg=45/56 on 01/28/18    Time  4    Period  Weeks    Status  Achieved      PT SHORT TERM GOAL #4   Title  The patient will ambulate x 600 ft nonstop to demo improved endurance for functional gait.    Baseline  Patient able to ambulate nonstop x 600 ft mod indep on level surfaces with SPc.     Time  4    Period  Weeks    Status  Achieved        PT Long Term Goals - 02/26/18 0945      PT LONG TERM GOAL #1   Title  The patient will perform updated HEP with intermittent assist for post d/c progression of exercise.    Baseline  Patient is doing prescribed exercises and walking as well as aquatic therapy     Time  4    Period   Weeks    Status  On-going    Target Date  03/28/18      PT LONG TERM GOAL #2   Title  The patient will move floor<>stand with mod indep using L UE support on surface due to h/o falls.    Baseline  floor to stand at S level with cues needed for sequencing 02/26/18    Time  4    Period  Weeks    Status  On-going    Target Date  03/28/18      PT LONG TERM GOAL #3   Title  The patient will improve gait speed from 0.85 ft/sec to > or equal to 2.5 ft/sec to demo dec'ing risk for falls.    Baseline  0.98 ft.sec on 12/22/2017 improved to 1.95 ft/sec on 02/19/18.    Time  8    Period  Weeks    Status  Revised    Target Date  03/28/18      PT LONG TERM GOAL #4   Title  The patient will improve Berg score from 49/56 (on 02/25/2018) up to 52/56 to demo dec'ing risk for falls.    Baseline  Berg (02/25/18) 49/56    Time  8    Period  Weeks    Status  Revised  Target Date  03/28/18      PT LONG TERM GOAL #5   Title  The patient will negotiate 12 steps with a handrail (light support only)with reciprocal pattern mod indep to access home entries.    Baseline  one handrail mod indep with step to pattern 02/25/18, no handrail cane only at S level 02/26/18    Time  8    Period  Weeks    Status  Revised    Target Date  03/28/18            Plan - 03/10/18 1501    Clinical Impression Statement  Skilled session focused on improving both open and closed chain control in RLE to carryover to improved gait and ability to perform more tasks leading with RLE as power leg.  Pt with reports of R low back pain and tightness, therefore educated on stretching and assessed R foot pain with  no obvious injury just soreness/tenderness to palpation.  Recommended use of ice, esp after walking longer distances and wearing some sort of shoe when doing exercises with strap.      Rehab Potential  Good    Clinical Impairments Affecting Rehab Potential  Patient motivated to participate,  aphasia continues to be a barrier    PT  Frequency  2x / week    PT Duration  8 weeks    PT Treatment/Interventions  ADLs/Self Care Home Management;Gait training;Stair training;Functional mobility training;Therapeutic activities;Therapeutic exercise;Balance training;Neuromuscular re-education;Patient/family education;Orthotic Fit/Training;Electrical Stimulation;Manual techniques;DME Instruction    PT Next Visit Plan  leave time to go out to their car to see if she can get in without use of step, Treadmill to work on gait speed (push at faster speeds as tolerable), R knee flex without hip hike, high level balance, Tall kneeling for right hip activation, R LE strengthening. work on knee flexion during gait.  Patient may be interested in community wellness/ clarify with family if they have access to gym.      Consulted and Agree with Plan of Care  Patient       Patient will benefit from skilled therapeutic intervention in order to improve the following deficits and impairments:  Abnormal gait, Impaired sensation, Pain, Postural dysfunction, Impaired tone, Decreased mobility, Decreased coordination, Decreased activity tolerance, Decreased endurance, Decreased strength, Difficulty walking, Decreased balance  Visit Diagnosis: Hemiplegia and hemiparesis following cerebral infarction affecting right dominant side (HCC)  Unsteadiness on feet  Muscle weakness (generalized)  Other abnormalities of gait and mobility     Problem List Patient Active Problem List   Diagnosis Date Noted  . Fall 09/17/2017  . Hemiparesis of right dominant side as late effect of cerebral infarction ([place]) 09/17/2017  . Shoulder subluxation, right, initial encounter 09/17/2017  . Cardioembolic stroke ([place]) 02/58/5277  . Weakness 09/17/2017  . Aphasia 09/17/2017  . 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 Cardiomyopathy ([place]) 09/17/2017  . H/O ischemic left MCA stroke 09/17/2017  . Cardiac LV ejection fraction 21-30%   . Graves disease   .  Plaque psoriasis   . Neuropathy 05/19/2017    Cameron Sprang, PT, MPT [facility] [address] [place], [place], [address] Phone: [phone]   Fax:  [phone] 03/10/18, 3:07 PM  Name: Margaret Ortiz MRN: [id number] Date of Birth: 02-03-1961

## 2018-03-11 ENCOUNTER — Encounter: Payer: Self-pay | Admitting: Occupational Therapy

## 2018-03-11 ENCOUNTER — Ambulatory Visit: Payer: BLUE CROSS/BLUE SHIELD | Admitting: Occupational Therapy

## 2018-03-11 DIAGNOSIS — R29818 Other symptoms and signs involving the nervous system: Secondary | ICD-10-CM

## 2018-03-11 DIAGNOSIS — M6281 Muscle weakness (generalized): Secondary | ICD-10-CM

## 2018-03-11 DIAGNOSIS — I69351 Hemiplegia and hemiparesis following cerebral infarction affecting right dominant side: Secondary | ICD-10-CM

## 2018-03-11 DIAGNOSIS — I69318 Other symptoms and signs involving cognitive functions following cerebral infarction: Secondary | ICD-10-CM

## 2018-03-11 DIAGNOSIS — M25511 Pain in right shoulder: Secondary | ICD-10-CM

## 2018-03-11 DIAGNOSIS — R208 Other disturbances of skin sensation: Secondary | ICD-10-CM

## 2018-03-11 DIAGNOSIS — R2681 Unsteadiness on feet: Secondary | ICD-10-CM

## 2018-03-11 DIAGNOSIS — R41842 Visuospatial deficit: Secondary | ICD-10-CM

## 2018-03-11 DIAGNOSIS — G8929 Other chronic pain: Secondary | ICD-10-CM

## 2018-03-11 NOTE — Therapy (Signed)
[facility] [address] [place], [place], [address] Phone: [phone]   Fax:  [phone]  Occupational Therapy Treatment  Patient Details  Name: Margaret Ortiz MRN: [id number] Date of Birth: [birth year] Referring Provider: Howard Pouch, DO   Encounter Date: 03/11/2018  OT End of Session - 03/11/18 1707    Visit Number  37    Number of Visits  42    Date for OT Re-Evaluation  04/24/18    OT Start Time  2751    OT Stop Time  1344    OT Time Calculation (min)  46 min    Activity Tolerance  Patient tolerated treatment well       Past Medical History:  Diagnosis Date  . Aphasia 09/17/2017  . 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  . Cardiac LV ejection fraction 21-30%   . Cardioembolic stroke ([place]) 70/06/7492   Right hemiparesis  . Cardiomyopathy ([place]) 04/2017  . Dysphagia 05/19/2017   Last known diet upgraded to regular diet with thin liquids on 06/27/2017, no records available  . 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 Fall 09/17/2017  . Graves disease   . H/O ischemic left MCA stroke 09/17/2017  . Hemiparesis of right dominant side as late effect of cerebral infarction ([place]) 09/17/2017  . Hyperlipidemia   . Hypertension   . Neuropathy 05/19/2017   s/p stroke  . Plaque psoriasis   . Shoulder subluxation, right, initial encounter 09/17/2017  . Weakness 09/17/2017    Past Surgical History:  Procedure Laterality Date  . NO PAST SURGERIES      There were no vitals filed for this visit.  Subjective Assessment - 03/11/18 1703    Subjective   Yes Ok I see what that    Patient is accompained by:  Family member   sister and brother in law   Pertinent History  Lt MCA CVA 05/19/2017, Graves dz    Patient Stated Goals  use my Rt arm    Currently in Pain?  No/denies         Treatment:  Pt seen for  aquatic therapy today.  Pt entered the pool via stairs using 1 railing.  Pt with improved ability today to rely less on LUE and control descent using BLE's.  Pt treated in 2.5- 4 feet of water depending upon activity.  Treatment focused on increasing active weight shift to midline and to the R through forced paradigm. Pt today worked primarily with LUE on floating dumb bell instead of stabilizing on wall.  Addressed active hip stabilization with stepping, side stepping, and functional ambulation with emphasis on full active weight shift to RLE before moving LLE.  Also addressed RLE strengthening for hip extension and ab/adduction - pt needing less cueing and less guiding for activity and demonstrating improved isolated movement- performed in deeper water for increased resistance.  Addressed core stability and dynamic standing balance via single leg stance on RLE with LUE stabilizing on floating dumb bell in deeper water for increased stabilization.  Pt exited water via steps (one at a time) using RLE as power leg with decreased reliance on LUE.  Sister reports that pt is now able to enter SUV without assistance or using step stool and also that pt is demonstrating faster gait speed which should result in decreasing fall risk.  OT Short Term Goals - 03/11/18 1704      OT SHORT TERM GOAL #1   Title  Pt/family independent with HEP for RUE - 11/27/17    Status  Achieved      OT SHORT TERM GOAL #2   Title  Independent with splint wear and care for Rt hand prn    Status  Achieved      OT SHORT TERM GOAL #3   Title  Pt/family to verbalize understanding with A/E and task modifications to increase indpendence with ADLS/IADLS (for bathing, tying shoes, cutting food, simple meal prep)    Status  Achieved      OT SHORT TERM GOAL #4   Title  Pt to perform bathing with only min assist and A/E prn while seated     Status  Achieved      OT SHORT TERM GOAL #5   Title  Pt to  make sandwich w/ only supervision/cues prn    Status  Achieved      OT SHORT TERM GOAL #6   Title  Pt to demo 25 degrees shoulder flexion in prep for low level reaching with gross finger flexion - goals due 01/21/2018    Time  4    Period  Weeks    Status  Not Met      OT SHORT TERM GOAL #7   Title  Pt to perform environmental scanning at 90% accuracy during ambulation    Time  4    Period  Weeks    Status  Not Met   77%      OT SHORT TERM GOAL #8   Title  Pt to demo initiation of proximal movement of UE, scapula and shoulder girdle stability for trunk stabilization with functional ambulation - 03/24/18    Time  4    Period  Weeks    Status  On-going        OT Long Term Goals - 03/11/18 1704      OT LONG TERM GOAL #1   Title  Pt to perform shower transfers at mod I level w/ DME prn and no LOB - goals due 02/18/2018    Time  8    Period  Weeks    Status  Not Met   Pt requires supervision to min assist for safety.      OT LONG TERM GOAL #2   Title  Pt to perform simple meal prep at sup level using A/E and task modifications prn    Time  8    Period  Weeks    Status  Achieved      OT LONG TERM GOAL #3   Title  Pt to perform light house management tasks at sup level including: laundry, washing dishes, cleaning, making bed    Time  8    Period  Weeks    Status  Achieved   per pt report     OT LONG TERM GOAL #4   Title  Pt to demo 25% finger extension in prep for releasing objects Rt hand     Time  8    Period  Weeks    Status  Not Met   no active finger extension     OT LONG TERM GOAL #5   Title  (LTG #5 moved to STG for renewal period)      OT LONG TERM GOAL #6   Title  Pt to use Rt hand as stabalizer 25% of the time  Time  8    Period  Weeks    Status  Not Met      OT LONG TERM GOAL #7   Title  Pt/family to verbalize understanding with neccessary accommodations, A/E, etc that would be needed if pt is to return to living alone    Time  8    Period  Weeks     Status  Deferred   Pt will not be safe to return to living alone at this time     OT LONG TERM GOAL #8   Title  Pt/family independent with home use of NMES for Rt wrist and finger extension - due 02/26/18    Time  1    Period  Weeks    Status  Achieved      OT LONG TERM GOAL  #9   Baseline  Pt independent with water based HEP w/ assist from family prn - due by 04/24/18    Time  8    Period  Weeks    Status  On-going      OT LONG TERM GOAL  #10   TITLE  Pt will improve postural control/alignment for functional mobility to decrease risk of falls during ADLS/IADL activities - due by 04/24/18    Time  8    Period  Weeks    Status  On-going            Plan - 03/11/18 1705    Clinical Impression Statement  Pt progressing toward remaining goals.  Pt with slowly improving improvement in postural alignment and balance. Family reports pt can now step into SUV without step and gait speed is faster.     Occupational Profile and client history currently impacting functional performance  no significant PMH but current impairments extensive    Occupational performance deficits (Please refer to evaluation for details):  ADL's;IADL's;Work;Leisure;Social Participation    Rehab Potential  Fair    Current Impairments/barriers affecting progress:  severity of deficits    OT Frequency  1x / week    OT Duration  8 weeks    OT Treatment/Interventions  Aquatic Therapy;Self-care/ADL training;DME and/or AE instruction;Therapeutic activities;Neuromuscular education;Patient/family education;Electrical Stimulation    Plan  Aquatic therapy to address remaining goals (STG #8, and LTG's #9 and #10)    Consulted and Agree with Plan of Care  Patient;Family member/caregiver    Family Member Consulted  sister and brother in law       Patient will benefit from skilled therapeutic intervention in order to improve the following deficits and impairments:  Decreased coordination, Decreased range of motion,  Difficulty walking, Improper body mechanics, Decreased endurance, Decreased safety awareness, Impaired sensation, Improper spinal/pelvic alignment, Impaired tone, Decreased knowledge of precautions, Decreased knowledge of use of DME, Decreased balance, Impaired UE functional use, Pain, Decreased cognition, Decreased mobility, Decreased strength, Impaired perceived functional ability, Impaired vision/preception  Visit Diagnosis: Hemiplegia and hemiparesis following cerebral infarction affecting right dominant side (HCC)  Unsteadiness on feet  Muscle weakness (generalized)  Visuospatial deficit  Other symptoms and signs involving the nervous system  Other disturbances of skin sensation  Chronic right shoulder pain  Other symptoms and signs involving cognitive functions following cerebral infarction    Problem List Patient Active Problem List   Diagnosis Date Noted  . Fall 09/17/2017  . Hemiparesis of right dominant side as late effect of cerebral infarction ([place]) 09/17/2017  . Shoulder subluxation, right, initial encounter 09/17/2017  . Cardioembolic stroke ([place]) 23/76/2831  . Weakness 09/17/2017  .  Aphasia 09/17/2017  . 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 Cardiomyopathy ([place]) 09/17/2017  . H/O ischemic left MCA stroke 09/17/2017  . Cardiac LV ejection fraction 21-30%   . Graves disease   . Plaque psoriasis   . Neuropathy 05/19/2017    Quay Burow, OTR/L 03/11/2018, 9:55 PM  [facility] [address] [place], [place], [address] Phone: [phone]   Fax:  [phone]  Name: Margaret Ortiz MRN: [id number] Date of Birth: Sep 03, 1960

## 2018-03-12 ENCOUNTER — Ambulatory Visit: Payer: BLUE CROSS/BLUE SHIELD | Admitting: Rehabilitative and Restorative Service Providers"

## 2018-03-12 ENCOUNTER — Encounter: Payer: BLUE CROSS/BLUE SHIELD | Admitting: Occupational Therapy

## 2018-03-12 ENCOUNTER — Encounter: Payer: Self-pay | Admitting: Rehabilitative and Restorative Service Providers"

## 2018-03-12 DIAGNOSIS — R29818 Other symptoms and signs involving the nervous system: Secondary | ICD-10-CM

## 2018-03-12 DIAGNOSIS — R2689 Other abnormalities of gait and mobility: Secondary | ICD-10-CM

## 2018-03-12 DIAGNOSIS — M6281 Muscle weakness (generalized): Secondary | ICD-10-CM

## 2018-03-12 DIAGNOSIS — R2681 Unsteadiness on feet: Secondary | ICD-10-CM

## 2018-03-12 NOTE — Patient Instructions (Signed)
Heel Cord Stretch    Place one leg forward, bent, other leg behind and straight. Lean forward keeping back heel flat. Hold _30___ seconds while counting out loud. Repeat with other leg. Repeat _3___ times. Do _2___ sessions per day.  http://gt2.exer.us/512   Copyright  VHI. All rights reserved.

## 2018-03-12 NOTE — Therapy (Signed)
[facility] [address] [place], [place], [address] Phone: [phone]   Fax:  [phone]  Physical Therapy Treatment  Patient Details  Name: Margaret Ortiz MRN: [id number] Date of Birth: March 03, 1961 Referring Provider: Howard Pouch, DO   Encounter Date: 03/12/2018  PT End of Session - 03/12/18 0935    Visit Number  33    Number of Visits  41   per updated POC   Date for PT Re-Evaluation  16/01/09   POC/cert for 60 days, will likely use only 30 days   Authorization Type  [employer]     PT Start Time  0930    PT Stop Time  1020    PT Time Calculation (min)  50 min    Equipment Utilized During Treatment  --   min guard to Sup - pt refusing gait belt   Activity Tolerance  Patient tolerated treatment well    Behavior During Therapy  [facility] for tasks assessed/performed       Past Medical History:  Diagnosis Date  . Aphasia 09/17/2017  . 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  . Cardiac LV ejection fraction 21-30%   . Cardioembolic stroke ([place]) 32/35/5732   Right hemiparesis  . Cardiomyopathy ([place]) 04/2017  . Dysphagia 05/19/2017   Last known diet upgraded to regular diet with thin liquids on 06/27/2017, no records available  . 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 Fall 09/17/2017  . Graves disease   . H/O ischemic left MCA stroke 09/17/2017  . Hemiparesis of right dominant side as late effect of cerebral infarction ([place]) 09/17/2017  . Hyperlipidemia   . Hypertension   . Neuropathy 05/19/2017   s/p stroke  . Plaque psoriasis   . Shoulder subluxation, right, initial encounter 09/17/2017  . Weakness 09/17/2017    Past Surgical History:  Procedure Laterality Date  . NO PAST SURGERIES      There were no vitals filed for this visit.  Subjective Assessment - 03/12/18 0934    Subjective  The patient was able to  get into the car without a step today.      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      Currently in Pain?  No/denies                       [facility] Adult PT Treatment/Exercise - 03/12/18 1225      Ambulation/Gait   Ambulation/Gait  Yes    Ambulation/Gait Assistance  6: Modified independent (Device/Increase time);5: Supervision;4: Min guard    Ambulation/Gait Assistance Details  Patient ambulates into clinic with SPC + AFO use mod indep.  PT provided tactile cues when ambulating with SPC working on staying in R terminal stance for moments loger to encourage knee flexion versus hip hike for swing phase initiation.  Also performed gait without device providing tactile cues in transverse plane at shoulders/hips.  Patient performed backwards walking iwth CGA to min A x 30 feet.      Ambulation Distance (Feet)  700 Feet    Assistive device  None;Straight cane    Gait Pattern  Decreased stance time - right;Decreased step length - left;Decreased hip/knee  flexion - right;Decreased dorsiflexion - right;Decreased weight shift to right;Right circumduction;Right hip hike;Lateral hip instability;Poor foot clearance - right    Ambulation Surface  Level;Indoor      Self-Care   Self-Care  Other Self-Care Comments    Other Self-Care Comments   Discussed use of AFO at all times when walking due to diminished isolated ankle control.      Neuro Re-ed    Neuro Re-ed Details   Seated on edge of mat with L hip supported/ R hip off edgeo fmat rolling a ball with R LE (provided R UE tasks holding dowel for trunk engagement).  Performed sit>stand from elevated mat with L LE on ball and tactile cues to faciliate R weight shifting.  Patient performed terminal stance R stride position with min A and tactile cues  for hip position/knee position.        Exercises   Exercises  Other Exercises    Other Exercises   Seated- attempted R ankle DF, however no isolated movement palpated.  Patient uses hip/knee to attempt to accomplish the movement.  Patient performed standing heel cord stretch *added to HEP.              PT Education - 03/12/18 0958    Education Details  HEP: heel cord stretch    Person(s) Educated  Patient    Methods  Explanation;Demonstration    Comprehension  Verbalized understanding;Returned demonstration       PT Short Term Goals - 02/26/18 0945      PT SHORT TERM GOAL #1   Title  The patient will ambulate on level community surfaces with SPC mod indep for improved mobility.    Baseline  Using [facility] in community for limited surfaces.    Time  4    Period  Weeks    Status  Achieved      PT SHORT TERM GOAL #2   Title  The patient will improve gait speed from 0.85 ft/sec to > or equal to 1.2 ft/sec to demonstrate transition from "household ambulator" to "limited community ambulator" classification of gait.    Baseline  1.17 ft/sec on 01/28/18    Time  4    Period  Weeks    Status  Partially Met      PT SHORT TERM GOAL #3   Title  The patient will improve Berg balance score from 42/56 to > or equal to 45/56 to demo dec'ing risk for falls.    Baseline  Berg=45/56 on 01/28/18    Time  4    Period  Weeks    Status  Achieved      PT SHORT TERM GOAL #4   Title  The patient will ambulate x 600 ft nonstop to demo improved endurance for functional gait.    Baseline  Patient able to ambulate nonstop x 600 ft mod indep on level surfaces with SPc.     Time  4    Period  Weeks    Status  Achieved        PT Long Term Goals - 02/26/18 0945      PT LONG TERM GOAL #1   Title  The patient will perform updated HEP with intermittent assist for post d/c progression of exercise.    Baseline  Patient is doing prescribed exercises and walking as well as aquatic therapy     Time  4     Period  Weeks    Status  On-going    Target  Date  03/28/18      PT LONG TERM GOAL #2   Title  The patient will move floor<>stand with mod indep using L UE support on surface due to h/o falls.    Baseline  floor to stand at S level with cues needed for sequencing 02/26/18    Time  4    Period  Weeks    Status  On-going    Target Date  03/28/18      PT LONG TERM GOAL #3   Title  The patient will improve gait speed from 0.85 ft/sec to > or equal to 2.5 ft/sec to demo dec'ing risk for falls.    Baseline  0.98 ft.sec on 12/22/2017 improved to 1.95 ft/sec on 02/19/18.    Time  8    Period  Weeks    Status  Revised    Target Date  03/28/18      PT LONG TERM GOAL #4   Title  The patient will improve Berg score from 49/56 (on 02/25/2018) up to 52/56 to demo dec'ing risk for falls.    Baseline  Berg (02/25/18) 49/56    Time  8    Period  Weeks    Status  Revised    Target Date  03/28/18      PT LONG TERM GOAL #5   Title  The patient will negotiate 12 steps with a handrail (light support only)with reciprocal pattern mod indep to access home entries.    Baseline  one handrail mod indep with step to pattern 02/25/18, no handrail cane only at S level 02/26/18    Time  8    Period  Weeks    Status  Revised    Target Date  03/28/18            Plan - 03/12/18 1231    Clinical Impression Statement  The patient continues to make gains in gait mechanics.  PT continuing to emphasize isolated motor control during gait to reduce maladaptive strategies of compensation.  PT to continue working towards [place].     PT Treatment/Interventions  ADLs/Self Care Home Management;Gait training;Stair training;Functional mobility training;Therapeutic activities;Therapeutic exercise;Balance training;Neuromuscular re-education;Patient/family education;Orthotic Fit/Training;Electrical Stimulation;Manual techniques;DME Instruction    PT Next Visit Plan  Treadmill to work on gait speed (push at faster speeds as tolerable), R  knee flex without hip hike, high level balance, Tall kneeling for right hip activation, R LE strengthening. work on knee flexion during gait.  Patient may be interested in community wellness/ clarify with family if they have access to gym.      Consulted and Agree with Plan of Care  Patient       Patient will benefit from skilled therapeutic intervention in order to improve the following deficits and impairments:  Abnormal gait, Impaired sensation, Pain, Postural dysfunction, Impaired tone, Decreased mobility, Decreased coordination, Decreased activity tolerance, Decreased endurance, Decreased strength, Difficulty walking, Decreased balance  Visit Diagnosis: Unsteadiness on feet  Muscle weakness (generalized)  Other symptoms and signs involving the nervous system  Other abnormalities of gait and mobility     Problem List Patient Active Problem List   Diagnosis Date Noted  . Fall 09/17/2017  . Hemiparesis of right dominant side as late effect of cerebral infarction ([place]) 09/17/2017  . Shoulder subluxation, right, initial encounter 09/17/2017  . Cardioembolic stroke ([place]) 40/98/1191  . Weakness 09/17/2017  . Aphasia 09/17/2017  . 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 Cardiomyopathy ([place]) 09/17/2017  .  H/O ischemic left MCA stroke 09/17/2017  . Cardiac LV ejection fraction 21-30%   . Graves disease   . Plaque psoriasis   . Neuropathy 05/19/2017    Cosima Prentiss, PT 03/12/2018, 12:32 PM  [facility] [address] [place], [place], [address] Phone: [phone]   Fax:  [phone]  Name: Zemira Zehring MRN: [id number] Date of Birth: [birth year]

## 2018-03-16 ENCOUNTER — Encounter: Payer: Self-pay | Admitting: Rehabilitation

## 2018-03-16 ENCOUNTER — Other Ambulatory Visit: Payer: Self-pay | Admitting: *Deleted

## 2018-03-16 ENCOUNTER — Ambulatory Visit: Payer: BLUE CROSS/BLUE SHIELD | Admitting: Rehabilitation

## 2018-03-16 DIAGNOSIS — R2689 Other abnormalities of gait and mobility: Secondary | ICD-10-CM

## 2018-03-16 DIAGNOSIS — Z7901 Long term (current) use of anticoagulants: Secondary | ICD-10-CM

## 2018-03-16 DIAGNOSIS — M6281 Muscle weakness (generalized): Secondary | ICD-10-CM

## 2018-03-16 DIAGNOSIS — I69891 Dysphagia following other cerebrovascular disease: Secondary | ICD-10-CM

## 2018-03-16 DIAGNOSIS — R29818 Other symptoms and signs involving the nervous system: Secondary | ICD-10-CM | POA: Diagnosis not present

## 2018-03-16 DIAGNOSIS — I69351 Hemiplegia and hemiparesis following cerebral infarction affecting right dominant side: Secondary | ICD-10-CM

## 2018-03-16 DIAGNOSIS — I639 Cerebral infarction, unspecified: Secondary | ICD-10-CM

## 2018-03-16 DIAGNOSIS — R2681 Unsteadiness on feet: Secondary | ICD-10-CM

## 2018-03-16 MED ORDER — ESOMEPRAZOLE MAGNESIUM 40 MG PO CPDR: 40.0000 mg | DELAYED_RELEASE_CAPSULE | Freq: Every day | ORAL | 0 refills | Status: DC

## 2018-03-16 MED ORDER — ATORVASTATIN CALCIUM 40 MG PO TABS: 40.0000 mg | ORAL_TABLET | Freq: Every day | ORAL | 0 refills | Status: DC

## 2018-03-16 MED ORDER — APIXABAN 5 MG PO TABS: 5.0000 mg | ORAL_TABLET | Freq: Two times a day (BID) | ORAL | 0 refills | Status: DC

## 2018-03-16 MED ORDER — GABAPENTIN 300 MG PO CAPS: 600.0000 mg | ORAL_CAPSULE | Freq: Every day | ORAL | 0 refills | Status: DC

## 2018-03-16 NOTE — Therapy (Signed)
[facility] [address] [place], [place], [address] Phone: [phone]   Fax:  [phone]  Physical Therapy Treatment  Patient Details  Name: Margaret Ortiz MRN: [id number] Date of Birth: [birth year] Referring Provider: Howard Pouch, DO   Encounter Date: 03/16/2018  PT End of Session - 03/16/18 1536    Visit Number  34    Number of Visits  41   per updated POC   Date for PT Re-Evaluation  85/63/14   POC/cert for 60 days, will likely use only 30 days   Authorization Type  BCBS     PT Start Time  1318    PT Stop Time  1402    PT Time Calculation (min)  44 min    Equipment Utilized During Treatment  --   min guard to Sup - pt refusing gait belt   Activity Tolerance  Patient tolerated treatment well    Behavior During Therapy  WFL for tasks assessed/performed       Past Medical History:  Diagnosis Date  . Aphasia 09/17/2017  . 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  . Cardiac LV ejection fraction 21-30%   . Cardioembolic stroke ([place]) 97/07/6376   Right hemiparesis  . Cardiomyopathy ([place]) 04/2017  . Dysphagia 05/19/2017   Last known diet upgraded to regular diet with thin liquids on 06/27/2017, no records available  . 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 Fall 09/17/2017  . Graves disease   . H/O ischemic left MCA stroke 09/17/2017  . Hemiparesis of right dominant side as late effect of cerebral infarction ([place]) 09/17/2017  . Hyperlipidemia   . Hypertension   . Neuropathy 05/19/2017   s/p stroke  . Plaque psoriasis   . Shoulder subluxation, right, initial encounter 09/17/2017  . Weakness 09/17/2017    Past Surgical History:  Procedure Laterality Date  . NO PAST SURGERIES      There were no vitals filed for this visit.  Subjective Assessment - 03/16/18 1324    Subjective  No big changes since last  session.  Reports no pain in foot today.     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      Currently in Pain?  No/denies               NMR:  Began in standing transitioning to tall kneeling in order to improve R forward hip protraction during stance/terminal stance with less activation/holding in LLE during terminal stance phase of gait.  Had pt perform weight shifting tasks on ground in standing, progressing to maintaining R stance with LLE propped on unstable object (cone, disc, stool), tall kneeling reaching forward for objects, holding for up to 5 secs once in correct postural alignment.  Pt tends to want to compensate with overt trunk extension and/or rotation, however pt able to better self correct during latter part of session with less cues and facilitation.    Gait:  Assessed gait for carryover with use of SPC and AFO.  Pt does have to slow gait however demo's great carryover within session for improved R LE loading and relaxed LLE.  Discussed slowing gait down to improve technique then would work to increase speed over time.  PT Education - 03/16/18 1536    Education provided  Yes    Education Details  increasing forward movement over RLE during stance with relaxed LLE advancement at terminal stance    Person(s) Educated  Patient;Caregiver(s)    Methods  Explanation    Comprehension  Verbalized understanding       PT Short Term Goals - 02/26/18 0945      PT SHORT TERM GOAL #1   Title  The patient will ambulate on level community surfaces with SPC mod indep for improved mobility.    Baseline  Using [facility] in community for limited surfaces.    Time  4    Period  Weeks    Status  Achieved      PT SHORT TERM GOAL #2   Title  The patient will improve gait speed from 0.85 ft/sec to > or equal to  1.2 ft/sec to demonstrate transition from "household ambulator" to "limited community ambulator" classification of gait.    Baseline  1.17 ft/sec on 01/28/18    Time  4    Period  Weeks    Status  Partially Met      PT SHORT TERM GOAL #3   Title  The patient will improve Berg balance score from 42/56 to > or equal to 45/56 to demo dec'ing risk for falls.    Baseline  Berg=45/56 on 01/28/18    Time  4    Period  Weeks    Status  Achieved      PT SHORT TERM GOAL #4   Title  The patient will ambulate x 600 ft nonstop to demo improved endurance for functional gait.    Baseline  Patient able to ambulate nonstop x 600 ft mod indep on level surfaces with SPc.     Time  4    Period  Weeks    Status  Achieved        PT Long Term Goals - 02/26/18 0945      PT LONG TERM GOAL #1   Title  The patient will perform updated HEP with intermittent assist for post d/c progression of exercise.    Baseline  Patient is doing prescribed exercises and walking as well as aquatic therapy     Time  4    Period  Weeks    Status  On-going    Target Date  03/28/18      PT LONG TERM GOAL #2   Title  The patient will move floor<>stand with mod indep using L UE support on surface due to h/o falls.    Baseline  floor to stand at S level with cues needed for sequencing 02/26/18    Time  4    Period  Weeks    Status  On-going    Target Date  03/28/18      PT LONG TERM GOAL #3   Title  The patient will improve gait speed from 0.85 ft/sec to > or equal to 2.5 ft/sec to demo dec'ing risk for falls.    Baseline  0.98 ft.sec on 12/22/2017 improved to 1.95 ft/sec on 02/19/18.    Time  8    Period  Weeks    Status  Revised    Target Date  03/28/18      PT LONG TERM GOAL #4   Title  The patient will improve Berg score from 49/56 (on 02/25/2018) up to 52/56 to demo dec'ing risk for falls.    Baseline  Merrilee Jansky (  02/25/18) 49/56    Time  8    Period  Weeks    Status  Revised    Target Date  03/28/18      PT LONG TERM GOAL  #5   Title  The patient will negotiate 12 steps with a handrail (light support only)with reciprocal pattern mod indep to access home entries.    Baseline  one handrail mod indep with step to pattern 02/25/18, no handrail cane only at S level 02/26/18    Time  8    Period  Weeks    Status  Revised    Target Date  03/28/18            Plan - 03/16/18 1537    Clinical Impression Statement  Skilled session focused on improving forward progression over RLE during stance phase of gait (with increased hip protraction) with reduced activation in LLE at terminal stance all to allow more WB on RLE during gait.  Pt making gains within task and also within session and able to carryover to gait.      PT Treatment/Interventions  ADLs/Self Care Home Management;Gait training;Stair training;Functional mobility training;Therapeutic activities;Therapeutic exercise;Balance training;Neuromuscular re-education;Patient/family education;Orthotic Fit/Training;Electrical Stimulation;Manual techniques;DME Instruction    PT Next Visit Plan  Improved R hip protraction (extension) in stance/forced use with less activation in LLE, R knee flex without hip hike, high level balance, Tall kneeling for right hip activation, R LE strengthening. work on knee flexion during gait.  Patient may be interested in community wellness/ clarify with family if they have access to gym.      Consulted and Agree with Plan of Care  Patient       Patient will benefit from skilled therapeutic intervention in order to improve the following deficits and impairments:  Abnormal gait, Impaired sensation, Pain, Postural dysfunction, Impaired tone, Decreased mobility, Decreased coordination, Decreased activity tolerance, Decreased endurance, Decreased strength, Difficulty walking, Decreased balance  Visit Diagnosis: Unsteadiness on feet  Muscle weakness (generalized)  Other abnormalities of gait and mobility  Hemiplegia and hemiparesis following  cerebral infarction affecting right dominant side [facility])     Problem List Patient Active Problem List   Diagnosis Date Noted  . Fall 09/17/2017  . Hemiparesis of right dominant side as late effect of cerebral infarction ([place]) 09/17/2017  . Shoulder subluxation, right, initial encounter 09/17/2017  . Cardioembolic stroke ([place]) 97/53/0051  . Weakness 09/17/2017  . Aphasia 09/17/2017  . 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 Cardiomyopathy ([place]) 09/17/2017  . H/O ischemic left MCA stroke 09/17/2017  . Cardiac LV ejection fraction 21-30%   . Graves disease   . Plaque psoriasis   . Neuropathy 05/19/2017    Cameron Sprang, PT, MPT [facility] [address] [place], [place], [address] Phone: [phone]   Fax:  [phone] 03/16/18, 3:44 PM  Name: Margaret Ortiz MRN: [id number] Date of Birth: 1961-01-03

## 2018-03-18 ENCOUNTER — Telehealth: Payer: Self-pay | Admitting: Family Medicine

## 2018-03-18 ENCOUNTER — Encounter: Payer: Self-pay | Admitting: Occupational Therapy

## 2018-03-18 ENCOUNTER — Ambulatory Visit: Payer: BLUE CROSS/BLUE SHIELD | Admitting: Occupational Therapy

## 2018-03-18 DIAGNOSIS — I69318 Other symptoms and signs involving cognitive functions following cerebral infarction: Secondary | ICD-10-CM

## 2018-03-18 DIAGNOSIS — I69351 Hemiplegia and hemiparesis following cerebral infarction affecting right dominant side: Secondary | ICD-10-CM

## 2018-03-18 DIAGNOSIS — G8929 Other chronic pain: Secondary | ICD-10-CM

## 2018-03-18 DIAGNOSIS — R208 Other disturbances of skin sensation: Secondary | ICD-10-CM

## 2018-03-18 DIAGNOSIS — M6281 Muscle weakness (generalized): Secondary | ICD-10-CM

## 2018-03-18 DIAGNOSIS — R29818 Other symptoms and signs involving the nervous system: Secondary | ICD-10-CM

## 2018-03-18 DIAGNOSIS — R2681 Unsteadiness on feet: Secondary | ICD-10-CM

## 2018-03-18 DIAGNOSIS — M25511 Pain in right shoulder: Secondary | ICD-10-CM

## 2018-03-18 DIAGNOSIS — R41842 Visuospatial deficit: Secondary | ICD-10-CM

## 2018-03-18 NOTE — Telephone Encounter (Signed)
Copied from [place] [id number]. Topic: Referral - Request >> Mar 18, 2018  1:09 PM Antonieta Iba C wrote: Reason for CRM: pt and brother called in to check the status of referral to Dr. Frederick Peers, neuro surgery. Brother says that they have not heard anything back in regard to referral  CB: [phone]  I called patient's brother back. He states the therapist at neuro rehab recommended Dr. Ilean Skill Psychologist. Patient needs a referral for an appointment.

## 2018-03-18 NOTE — Therapy (Signed)
[facility] [address] [place], [place], [address] Phone: [phone]   Fax:  [phone]  Occupational Therapy Treatment  Patient Details  Name: Margaret Ortiz MRN: [id number] Date of Birth: May 04, 1961 Referring Provider: Howard Pouch, DO   Encounter Date: 03/18/2018  OT End of Session - 03/18/18 2109    Visit Number  38    Number of Visits  42    Date for OT Re-Evaluation  04/24/18    Authorization Type  BC/BS    OT Start Time  1258    OT Stop Time  1345    OT Time Calculation (min)  47 min    Activity Tolerance  Patient tolerated treatment well       Past Medical History:  Diagnosis Date  . Aphasia 09/17/2017  . 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  . Cardiac LV ejection fraction 21-30%   . Cardioembolic stroke ([place]) 37/85/8850   Right hemiparesis  . Cardiomyopathy ([place]) 04/2017  . Dysphagia 05/19/2017   Last known diet upgraded to regular diet with thin liquids on 06/27/2017, no records available  . 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 Fall 09/17/2017  . Graves disease   . H/O ischemic left MCA stroke 09/17/2017  . Hemiparesis of right dominant side as late effect of cerebral infarction ([place]) 09/17/2017  . Hyperlipidemia   . Hypertension   . Neuropathy 05/19/2017   s/p stroke  . Plaque psoriasis   . Shoulder subluxation, right, initial encounter 09/17/2017  . Weakness 09/17/2017    Past Surgical History:  Procedure Laterality Date  . NO PAST SURGERIES      There were no vitals filed for this visit.  Subjective Assessment - 03/18/18 2106    Subjective   I like this water therapy    Patient is accompained by:  Family member   sister and brother in law   Pertinent History  Lt MCA CVA 05/19/2017, Graves dz    Patient Stated Goals  use my Rt arm    Currently in Pain?  No/denies          Pt seen for aquatic therapy today.  Pt treated in water 2.5 -4 feet deep depending upon activity. Pt entered the pool via steps leading down with RLE and using hand rail with LUE.  Pt needs cues to reduce amount of LUE support for all functional mobility. Pt needs min a for R foot placement on steps.  Treatment focused on emphasis on postural alignment and postural control with facilitation of increasing weight bearing and activation of R side during forward ambulation, walking backward and side stepping. Pt needs min - mod facilitation for active engagement of RLE, trunk and proximal RUE for stabilization and stable mobility on R side as well as mobility for RLE. Moved into deeper water and then into more shallow water to alter support provided by buoyancy and to increase and decrease demand on core strength, RLE proximal control and postural alignment and control.  Pt also requires significant repetition due to apraxia, sensory impairment and cognitive impairment.  Pt demonstrating ability today to decrease reliance on LUE with mobility and balance activities. Also addressed LE strengthening for RLE for hip extension and hip ab/adduction.  Pt benefits from refraction as well that demands increased focus on sensory input.  Pt exited water via steps using RLE as power leg - pt needed  demonstration and min facilitation only today and demonstrated less reliance on LUE after cueing.                      OT Short Term Goals - 03/18/18 2107      OT SHORT TERM GOAL #1   Title  Pt/family independent with HEP for RUE - 11/27/17    Status  Achieved      OT SHORT TERM GOAL #2   Title  Independent with splint wear and care for Rt hand prn    Status  Achieved      OT SHORT TERM GOAL #3   Title  Pt/family to verbalize understanding with A/E and task modifications to increase indpendence with ADLS/IADLS (for bathing, tying shoes, cutting food, simple meal prep)    Status  Achieved      OT  SHORT TERM GOAL #4   Title  Pt to perform bathing with only min assist and A/E prn while seated     Status  Achieved      OT SHORT TERM GOAL #5   Title  Pt to make sandwich w/ only supervision/cues prn    Status  Achieved      OT SHORT TERM GOAL #6   Title  Pt to demo 25 degrees shoulder flexion in prep for low level reaching with gross finger flexion - goals due 01/21/2018    Time  4    Period  Weeks    Status  Not Met      OT SHORT TERM GOAL #7   Title  Pt to perform environmental scanning at 90% accuracy during ambulation    Time  4    Period  Weeks    Status  Not Met   77%      OT SHORT TERM GOAL #8   Title  Pt to demo initiation of proximal movement of UE, scapula and shoulder girdle stability for trunk stabilization with functional ambulation - 03/24/18    Time  4    Period  Weeks    Status  Achieved        OT Long Term Goals - 03/18/18 2107      OT LONG TERM GOAL #1   Title  Pt to perform shower transfers at mod I level w/ DME prn and no LOB - goals due 02/18/2018    Time  8    Period  Weeks    Status  Not Met   Pt requires supervision to min assist for safety.      OT LONG TERM GOAL #2   Title  Pt to perform simple meal prep at sup level using A/E and task modifications prn    Time  8    Period  Weeks    Status  Achieved      OT LONG TERM GOAL #3   Title  Pt to perform light house management tasks at sup level including: laundry, washing dishes, cleaning, making bed    Time  8    Period  Weeks    Status  Achieved   per pt report     OT LONG TERM GOAL #4   Title  Pt to demo 25% finger extension in prep for releasing objects Rt hand     Time  8    Period  Weeks    Status  Not Met   no active finger extension     OT LONG TERM GOAL #5  Title  (LTG #5 moved to STG for renewal period)      OT LONG TERM GOAL #6   Title  Pt to use Rt hand as stabalizer 25% of the time    Time  8    Period  Weeks    Status  Not Met      OT LONG TERM GOAL #7   Title   Pt/family to verbalize understanding with neccessary accommodations, A/E, etc that would be needed if pt is to return to living alone    Time  8    Period  Weeks    Status  Deferred   Pt will not be safe to return to living alone at this time     OT LONG TERM GOAL #8   Title  Pt/family independent with home use of NMES for Rt wrist and finger extension - due 02/26/18    Time  1    Period  Weeks    Status  Achieved      OT LONG TERM GOAL  #9   Baseline  Pt independent with water based HEP w/ assist from family prn - due by 04/24/18    Time  8    Period  Weeks    Status  On-going      OT LONG TERM GOAL  #10   TITLE  Pt will improve postural control/alignment for functional mobility to decrease risk of falls during ADLS/IADL activities - due by 04/24/18    Time  8    Period  Weeks    Status  On-going            Plan - 03/18/18 2108    Clinical Impression Statement  Pt has met all STG"s and working toward LTG"s at this time. Pt demonstrating improved proximal stability for RUE and trunk control.     Occupational Profile and client history currently impacting functional performance  no significant PMH but current impairments extensive    Occupational performance deficits (Please refer to evaluation for details):  ADL's;IADL's;Work;Leisure;Social Participation    Rehab Potential  Fair    Current Impairments/barriers affecting progress:  severity of deficits    OT Frequency  1x / week    OT Duration  8 weeks    OT Treatment/Interventions  Aquatic Therapy;Self-care/ADL training;DME and/or AE instruction;Therapeutic activities;Neuromuscular education;Patient/family education;Electrical Stimulation    Plan  Aquatic therapy to address remaining goals (STG #8, and LTG's #9 and #10)    Consulted and Agree with Plan of Care  Patient;Family member/caregiver    Family Member Consulted  sister and brother in law       Patient will benefit from skilled therapeutic intervention in order to  improve the following deficits and impairments:  Decreased coordination, Decreased range of motion, Difficulty walking, Improper body mechanics, Decreased endurance, Decreased safety awareness, Impaired sensation, Improper spinal/pelvic alignment, Impaired tone, Decreased knowledge of precautions, Decreased knowledge of use of DME, Decreased balance, Impaired UE functional use, Pain, Decreased cognition, Decreased mobility, Decreased strength, Impaired perceived functional ability, Impaired vision/preception  Visit Diagnosis: Unsteadiness on feet  Muscle weakness (generalized)  Hemiplegia and hemiparesis following cerebral infarction affecting right dominant side ([facility])  Other symptoms and signs involving the nervous system  Visuospatial deficit  Other disturbances of skin sensation  Chronic right shoulder pain  Other symptoms and signs involving cognitive functions following cerebral infarction    Problem List Patient Active Problem List   Diagnosis Date Noted  . Fall 09/17/2017  . Hemiparesis of right  dominant side as late effect of cerebral infarction ([place]) 09/17/2017  . Shoulder subluxation, right, initial encounter 09/17/2017  . Cardioembolic stroke ([place]) 01/64/2903  . Weakness 09/17/2017  . Aphasia 09/17/2017  . 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 Cardiomyopathy ([place]) 09/17/2017  . H/O ischemic left MCA stroke 09/17/2017  . Cardiac LV ejection fraction 21-30%   . Graves disease   . Plaque psoriasis   . Neuropathy 05/19/2017    Quay Burow, OTR/L 03/18/2018, 9:10 PM  [facility] [address] [place], [place], [address] Phone: [phone]   Fax:  [phone]  Name: Rachana Malesky MRN: [id number] Date of Birth: 09-24-1960

## 2018-03-20 ENCOUNTER — Encounter: Payer: Self-pay | Admitting: Rehabilitation

## 2018-03-20 ENCOUNTER — Ambulatory Visit: Payer: BLUE CROSS/BLUE SHIELD | Admitting: Rehabilitation

## 2018-03-20 DIAGNOSIS — I69351 Hemiplegia and hemiparesis following cerebral infarction affecting right dominant side: Secondary | ICD-10-CM

## 2018-03-20 DIAGNOSIS — R2681 Unsteadiness on feet: Secondary | ICD-10-CM

## 2018-03-20 DIAGNOSIS — M6281 Muscle weakness (generalized): Secondary | ICD-10-CM

## 2018-03-20 DIAGNOSIS — R2689 Other abnormalities of gait and mobility: Secondary | ICD-10-CM

## 2018-03-20 DIAGNOSIS — R29818 Other symptoms and signs involving the nervous system: Secondary | ICD-10-CM | POA: Diagnosis not present

## 2018-03-20 NOTE — Therapy (Signed)
[facility] [address] [place], [place], [address] Phone: [phone]   Fax:  [phone]  Physical Therapy Treatment  Patient Details  Name: Margaret Ortiz MRN: [id number] Date of Birth: Nov 05, 1960 Referring Provider (PT): Howard Pouch, DO   Encounter Date: 03/20/2018  PT End of Session - 03/20/18 2112    Visit Number  35    Number of Visits  41    Authorization Type  BCBS     PT Start Time  1318    PT Stop Time  1402    PT Time Calculation (min)  44 min    Activity Tolerance  Patient tolerated treatment well    Behavior During Therapy  [facility] for tasks assessed/performed       Past Medical History:  Diagnosis Date  . Aphasia 09/17/2017  . 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  . Cardiac LV ejection fraction 21-30%   . Cardioembolic stroke ([place]) 74/16/3845   Right hemiparesis  . Cardiomyopathy ([place]) 04/2017  . Dysphagia 05/19/2017   Last known diet upgraded to regular diet with thin liquids on 06/27/2017, no records available  . 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 Fall 09/17/2017  . Graves disease   . H/O ischemic left MCA stroke 09/17/2017  . Hemiparesis of right dominant side as late effect of cerebral infarction ([place]) 09/17/2017  . Hyperlipidemia   . Hypertension   . Neuropathy 05/19/2017   s/p stroke  . Plaque psoriasis   . Shoulder subluxation, right, initial encounter 09/17/2017  . Weakness 09/17/2017    Past Surgical History:  Procedure Laterality Date  . NO PAST SURGERIES      There were no vitals filed for this visit.  Subjective Assessment - 03/20/18 2106    Subjective  Pt reports she is doing well.  Still wants to have improvements in arm and leg.      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      Currently in Pain?  No/denies             TE:  Continue to address R heel cord tightness with standing runner's stretch (with UE support) and PT providing assist for ensuring proper R foot placement and providing verbal and tactile cues as needed for proper technique with stretching.  Performed x 2 reps with 30 sec holds.   NMR:  RLE loading without shoe brace to address R heel cord tightness in WB position while also addressing R hip extension with decreasing LUE support.  Had pt stand with feet staggered (L foot behind R) performing ant/post weight shifts with emphasis on R hip protraction and forward translation of R tibia over ankle (PT providing stability for increased ankle stretch throughout) with tactile cues for relaxed posture and decreased LUE support (had her place hand on chest).  Progressed to forward/retro stepping LLE again with emphasis on forward R hip extension/knee extension and now with relaxation of L knee into flexion prior to advancement.  Lifting L foot to/from standard chair seat with use of UE support on cane (cues for light support).  Pt did very well maintaining R hip and knee extension with forward weight shift over RLE during task.  PT supporting R knee on PTs  thigh performing R hip protraction followed by active hip extension and then hip extension with resistance by PT.  Note improved isolated R hip extension however when resistance applied, pt continues to demonstrate timing deficits and is unable to maintain contraction.  Also performed hip abd in this manner, however pt continues to demonstrate trunk compensations during this motion.  Had pt get into tall kneeling with R knee/shin on roller board attempting to move into hip abd.  Again, this was difficult for pt from a mobility standpoint, but also due to speech/cognitive deficits along with apraxia.    Gait:  Assessed carryover at the end of  session with gait x 345' with SPC (some without, see below).  Pt continued to require cues for decreased force of RLE forward advancement and improved R forward translation of R foot at mid stance phase of gait.  Note marked improvement with ability to relax LLE at terminal stance into swing phase of gait.  Intermittently took cane away to provide light LUE vs RUE support.  Note some trunk compensations returned, therefore returned [place] with cues to lighten up support on cane and immediately noted improvement in quality of gait while maintaining good gait speed.                   PT Education - 03/20/18 2107    Education provided  Yes    Education Details  Education that PT would continue for 4 more weeks with liklihood of D/C following 4 weeks, continue to educate on pts progress and relating that to functional tasks/mobility.      Person(s) Educated  Patient    Methods  Explanation    Comprehension  Verbalized understanding       PT Short Term Goals - 02/26/18 0945      PT SHORT TERM GOAL #1   Title  The patient will ambulate on level community surfaces with SPC mod indep for improved mobility.    Baseline  Using [facility] in community for limited surfaces.    Time  4    Period  Weeks    Status  Achieved      PT SHORT TERM GOAL #2   Title  The patient will improve gait speed from 0.85 ft/sec to > or equal to 1.2 ft/sec to demonstrate transition from "household ambulator" to "limited community ambulator" classification of gait.    Baseline  1.17 ft/sec on 01/28/18    Time  4    Period  Weeks    Status  Partially Met      PT SHORT TERM GOAL #3   Title  The patient will improve Berg balance score from 42/56 to > or equal to 45/56 to demo dec'ing risk for falls.    Baseline  Berg=45/56 on 01/28/18    Time  4    Period  Weeks    Status  Achieved      PT SHORT TERM GOAL #4   Title  The patient will ambulate x 600 ft nonstop to demo improved endurance for functional gait.     Baseline  Patient able to ambulate nonstop x 600 ft mod indep on level surfaces with SPc.     Time  4    Period  Weeks    Status  Achieved        PT Long Term Goals - 02/26/18 0945      PT LONG TERM GOAL #1   Title  The patient will perform updated HEP  with intermittent assist for post d/c progression of exercise.    Baseline  Patient is doing prescribed exercises and walking as well as aquatic therapy     Time  4    Period  Weeks    Status  On-going    Target Date  03/28/18      PT LONG TERM GOAL #2   Title  The patient will move floor<>stand with mod indep using L UE support on surface due to h/o falls.    Baseline  floor to stand at S level with cues needed for sequencing 02/26/18    Time  4    Period  Weeks    Status  On-going    Target Date  03/28/18      PT LONG TERM GOAL #3   Title  The patient will improve gait speed from 0.85 ft/sec to > or equal to 2.5 ft/sec to demo dec'ing risk for falls.    Baseline  0.98 ft.sec on 12/22/2017 improved to 1.95 ft/sec on 02/19/18.    Time  8    Period  Weeks    Status  Revised    Target Date  03/28/18      PT LONG TERM GOAL #4   Title  The patient will improve Berg score from 49/56 (on 02/25/2018) up to 52/56 to demo dec'ing risk for falls.    Baseline  Berg (02/25/18) 49/56    Time  8    Period  Weeks    Status  Revised    Target Date  03/28/18      PT LONG TERM GOAL #5   Title  The patient will negotiate 12 steps with a handrail (light support only)with reciprocal pattern mod indep to access home entries.    Baseline  one handrail mod indep with step to pattern 02/25/18, no handrail cane only at S level 02/26/18    Time  8    Period  Weeks    Status  Revised    Target Date  03/28/18            Plan - 03/20/18 2109    Clinical Impression Statement  Continue to note within session improvements in gait and beginning to note more isolated hip extension and abd without overt trunk activation.  Will plan to assess LTGs next week  and extend POC for another 4 weeks with likely D/C at end of 4 weeks.      PT Treatment/Interventions  ADLs/Self Care Home Management;Gait training;Stair training;Functional mobility training;Therapeutic activities;Therapeutic exercise;Balance training;Neuromuscular re-education;Patient/family education;Orthotic Fit/Training;Electrical Stimulation;Manual techniques;DME Instruction    PT Next Visit Plan  Start to assess LTGs to continue for another 4 weeks-will need to update visit count, but dates should be okay. Improved R hip protraction (extension) in stance/forced use with less activation in LLE, R knee flex without hip hike, decreased trunk activation, high level balance, Tall kneeling for right hip activation, R LE strengthening. work on knee flexion during gait.  Patient may be interested in community wellness/ clarify with family if they have access to gym.      Consulted and Agree with Plan of Care  Patient       Patient will benefit from skilled therapeutic intervention in order to improve the following deficits and impairments:  Abnormal gait, Impaired sensation, Pain, Postural dysfunction, Impaired tone, Decreased mobility, Decreased coordination, Decreased activity tolerance, Decreased endurance, Decreased strength, Difficulty walking, Decreased balance  Visit Diagnosis: Unsteadiness on feet  Muscle weakness (generalized)  Hemiplegia  and hemiparesis following cerebral infarction affecting right dominant side (HCC)  Other abnormalities of gait and mobility     Problem List Patient Active Problem List   Diagnosis Date Noted  . Fall 09/17/2017  . Hemiparesis of right dominant side as late effect of cerebral infarction ([place]) 09/17/2017  . Shoulder subluxation, right, initial encounter 09/17/2017  . Cardioembolic stroke ([place]) 90/24/0973  . Weakness 09/17/2017  . Aphasia 09/17/2017  . 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 Cardiomyopathy ([place]) 09/17/2017  .  H/O ischemic left MCA stroke 09/17/2017  . Cardiac LV ejection fraction 21-30%   . Graves disease   . Plaque psoriasis   . Neuropathy 05/19/2017    Cameron Sprang, PT, MPT [facility] [address] [place], [place], [address] Phone: [phone]   Fax:  [phone] 03/20/18, 9:25 PM  Name: Tyarra Nolton MRN: [id number] Date of Birth: 09-27-1960

## 2018-03-23 ENCOUNTER — Encounter: Payer: Self-pay | Admitting: Physical Therapy

## 2018-03-23 ENCOUNTER — Ambulatory Visit: Payer: BLUE CROSS/BLUE SHIELD | Admitting: Physical Therapy

## 2018-03-23 DIAGNOSIS — R2689 Other abnormalities of gait and mobility: Secondary | ICD-10-CM

## 2018-03-23 DIAGNOSIS — R2681 Unsteadiness on feet: Secondary | ICD-10-CM

## 2018-03-23 DIAGNOSIS — M6281 Muscle weakness (generalized): Secondary | ICD-10-CM

## 2018-03-23 DIAGNOSIS — R29818 Other symptoms and signs involving the nervous system: Secondary | ICD-10-CM | POA: Diagnosis not present

## 2018-03-23 DIAGNOSIS — I69351 Hemiplegia and hemiparesis following cerebral infarction affecting right dominant side: Secondary | ICD-10-CM

## 2018-03-23 NOTE — Therapy (Signed)
[facility] [address] [place], [place], [address] Phone: [phone]   Fax:  [phone]  Physical Therapy Treatment  Patient Details  Name: Margaret Ortiz MRN: [id number] Date of Birth: 01/07/1961 Referring Provider (PT): Howard Pouch, DO   Encounter Date: 03/23/2018  PT End of Session - 03/23/18 1323    Visit Number  36    Number of Visits  41    Date for PT Re-Evaluation  04/27/18    Authorization Type  BCBS     PT Start Time  1318    PT Stop Time  1400    PT Time Calculation (min)  42 min    Equipment Utilized During Treatment  --   min guard assist due to pt refuses belt   Activity Tolerance  Patient tolerated treatment well    Behavior During Therapy  [facility] for tasks assessed/performed       Past Medical History:  Diagnosis Date  . Aphasia 09/17/2017  . 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  . Cardiac LV ejection fraction 21-30%   . Cardioembolic stroke ([place]) 42/87/6811   Right hemiparesis  . Cardiomyopathy ([place]) 04/2017  . Dysphagia 05/19/2017   Last known diet upgraded to regular diet with thin liquids on 06/27/2017, no records available  . 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 Fall 09/17/2017  . Graves disease   . H/O ischemic left MCA stroke 09/17/2017  . Hemiparesis of right dominant side as late effect of cerebral infarction ([place]) 09/17/2017  . Hyperlipidemia   . Hypertension   . Neuropathy 05/19/2017   s/p stroke  . Plaque psoriasis   . Shoulder subluxation, right, initial encounter 09/17/2017  . Weakness 09/17/2017    Past Surgical History:  Procedure Laterality Date  . NO PAST SURGERIES      There were no vitals filed for this visit.  Subjective Assessment - 03/23/18 1322    Subjective  No new complaints. No falls or pain to report.     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.      Currently in Pain?  No/denies    Pain Score  0-No pain         [facility] PT Assessment - 03/23/18 1325      Transfers   Transfers  Floor to Transfer    Floor to Transfer  4: Min guard;5: Supervision    Floor to Transfer Details (indicate cue type and reason)  x3 reps with UE support on mat table going to/from red mat on floor. cues needed on sequencing with 1st 2 reps, no cues only supervision for safety with 3rd rep.       Ambulation/Gait   Ambulation/Gait  Yes    Ambulation/Gait Assistance  6: Modified independent (Device/Increase time);5: Supervision;4: Min guard    Ambulation/Gait Assistance Details  cues on posture. no balance issues noted. cues for increased right stance and improved advancement of left LE (incr hip/knee flexion).    Ambulation Distance (Feet)  115 Feet   x2, around gym with activities/testing   Assistive device  Straight cane   right brace   Gait Pattern  Decreased stance time - right;Decreased step length - left;Decreased hip/knee flexion - right;Decreased dorsiflexion - right;Decreased weight shift  to right;Right circumduction;Right hip hike;Lateral hip instability;Poor foot clearance - right    Ambulation Surface  Level;Indoor    Gait velocity  17.12 sec's = 1.92 ft sec with   with supervision to min guard assist    Stairs  Yes    Stairs Assistance  4: Min guard;5: Supervision;4: Min assist    Stairs Assistance Details (indicate cue type and reason)  1 rep with step to pattern. attempted to perform reciprocal pattern, however dispite which foot advanced up or down 1st , pt continued to bring the other foot to same step. when cued to slow down pt able to perform 2 steps consecutively using reciprocal pattern before reverting back to step to pattern.     Stair Management Technique  One rail Right;One rail  Left;Alternating pattern;Step to pattern;Forwards    Number of Stairs  4   x4 reps   Height of Stairs  6      Standardized Balance Assessment   Standardized Balance Assessment  Berg Balance Test      Berg Balance Test   Sit to Stand  Able to stand without using hands and stabilize independently    Standing Unsupported  Able to stand safely 2 minutes    Sitting with Back Unsupported but Feet Supported on Floor or Stool  Able to sit safely and securely 2 minutes    Stand to Sit  Sits safely with minimal use of hands    Transfers  Able to transfer safely, minor use of hands    Standing Unsupported with Eyes Closed  Able to stand 10 seconds safely    Standing Ubsupported with Feet Together  Able to place feet together independently and stand 1 minute safely    From Standing, Reach Forward with Outstretched Arm  Can reach forward >12 cm safely (5")   7 inches   From Standing Position, Pick up Object from Floor  Able to pick up shoe safely and easily    From Standing Position, Turn to Look Behind Over each Shoulder  Looks behind from both sides and weight shifts well    Turn 360 Degrees  Able to turn 360 degrees safely but slowly   >5 sec's both ways   Standing Unsupported, Alternately Place Feet on Step/Stool  Able to complete 4 steps without aid or supervision    Standing Unsupported, One Foot in Front  Able to plae foot ahead of the other independently and hold 30 seconds    Standing on One Leg  Able to lift leg independently and hold > 10 seconds    Total Score  50            PT Short Term Goals - 02/26/18 0945      PT SHORT TERM GOAL #1   Title  The patient will ambulate on level community surfaces with SPC mod indep for improved mobility.    Baseline  Using [facility] in community for limited surfaces.    Time  4    Period  Weeks    Status  Achieved      PT SHORT TERM GOAL #2   Title  The patient will improve gait speed from 0.85 ft/sec to > or equal to 1.2 ft/sec to demonstrate  transition from "household ambulator" to "limited community ambulator" classification of gait.    Baseline  1.17 ft/sec on 01/28/18    Time  4    Period  Weeks    Status  Partially Met  PT SHORT TERM GOAL #3   Title  The patient will improve Berg balance score from 42/56 to > or equal to 45/56 to demo dec'ing risk for falls.    Baseline  Berg=45/56 on 01/28/18    Time  4    Period  Weeks    Status  Achieved      PT SHORT TERM GOAL #4   Title  The patient will ambulate x 600 ft nonstop to demo improved endurance for functional gait.    Baseline  Patient able to ambulate nonstop x 600 ft mod indep on level surfaces with SPc.     Time  4    Period  Weeks    Status  Achieved        PT Long Term Goals - 03/23/18 1323      PT LONG TERM GOAL #1   Title  The patient will perform updated HEP with intermittent assist for post d/c progression of exercise.    Baseline  03/23/18: met as patient is doing prescribed exercises and walking as well as aquatic therapy, may need to be advanced after next 4 week cert.     Status  Achieved      PT LONG TERM GOAL #2   Title  The patient will move floor<>stand with mod indep using L UE support on surface due to h/o falls.    Baseline  03/23/18: performed x3 reps today with min guard assist downgrading to supervision, cues on sequencing with left UE support on mat table. no change since goal checked/set on 02/26/18.    Time  --    Period  --    Status  Not Met      PT LONG TERM GOAL #3   Title  The patient will improve gait speed from 0.85 ft/sec to > or equal to 2.5 ft/sec to demo dec'ing risk for falls.    Baseline  03/23/18: 1.92 ft/sec with cane/AFO with supervision, less that when was checked on 02/19/18 (1.95 ft/sec)    Time  --    Period  --    Status  Not Met      PT LONG TERM GOAL #4   Title  The patient will improve Berg score from 49/56 (on 02/25/2018) up to 52/56 to demo dec'ing risk for falls.    Baseline  03/23/18: 50/56 scored today.  improved from 49/56 on 02/25/18.    Time  --    Period  --    Status  Partially Met      PT LONG TERM GOAL #5   Title  The patient will negotiate 12 steps with a handrail (light support only)with reciprocal pattern mod indep to access home entries.    Baseline  03/23/18: cont's to be Mod I with single rail/step to pattern. min guard to min assist when using reciprocal pattern with cues needed.    Time  --    Period  --    Status  Not Met            Plan - 03/23/18 1323    Clinical Impression Statement  Today's skilled session focused on progress toward LTGs with mild improvement in Berg Balance Test noted today. Pt continues to be progressing toward other goals as well. Primary PT plans to recet for 4 more weeks.     PT Treatment/Interventions  ADLs/Self Care Home Management;Gait training;Stair training;Functional mobility training;Therapeutic activities;Therapeutic exercise;Balance training;Neuromuscular re-education;Patient/family education;Orthotic Fit/Training;Electrical Stimulation;Manual techniques;DME Instruction    PT Next  Visit Plan  primary PT to recert; continue to work on improved right hip protraction (extension) in stance, right knee flexion without hip hike for swing phase of gait, high level balance. continue with tall kneelking for right hip activation, right LE strengthening. check into pt interest in community fitness and current options available to her    Consulted and Agree with Plan of Care  Patient       Patient will benefit from skilled therapeutic intervention in order to improve the following deficits and impairments:  Abnormal gait, Impaired sensation, Pain, Postural dysfunction, Impaired tone, Decreased mobility, Decreased coordination, Decreased activity tolerance, Decreased endurance, Decreased strength, Difficulty walking, Decreased balance  Visit Diagnosis: Unsteadiness on feet  Muscle weakness (generalized)  Hemiplegia and hemiparesis following  cerebral infarction affecting right dominant side (HCC)  Other abnormalities of gait and mobility     Problem List Patient Active Problem List   Diagnosis Date Noted  . Fall 09/17/2017  . Hemiparesis of right dominant side as late effect of cerebral infarction ([place]) 09/17/2017  . Shoulder subluxation, right, initial encounter 09/17/2017  . Cardioembolic stroke ([place]) 25/18/9842  . Weakness 09/17/2017  . Aphasia 09/17/2017  . 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 Cardiomyopathy ([place]) 09/17/2017  . H/O ischemic left MCA stroke 09/17/2017  . Cardiac LV ejection fraction 21-30%   . Graves disease   . Plaque psoriasis   . Neuropathy 05/19/2017    Willow Ora, PTA, [facility] [address], [place], [place] [address] [phone] 03/23/18, 9:00 PM   Name: Margaret Ortiz MRN: [id number] Date of Birth: 24-Jan-1961

## 2018-03-24 NOTE — Telephone Encounter (Signed)
Called patient left message for patient's family to return call to schedule appointment for patients chronic medical conditions. Ok for [facility] to schedule.

## 2018-03-24 NOTE — Telephone Encounter (Signed)
I will need more information regarding the referral request. I would recommend they come in to discuss. Sh eis due for f/u and it appears Dr. Genelle Gather refilled 1 mo prescripts while I was out, so she will need to be seen in the next 2 weeks before running out of scripts.

## 2018-03-25 ENCOUNTER — Ambulatory Visit: Payer: BLUE CROSS/BLUE SHIELD | Attending: Family Medicine | Admitting: Occupational Therapy

## 2018-03-25 ENCOUNTER — Encounter: Payer: Self-pay | Admitting: Occupational Therapy

## 2018-03-25 ENCOUNTER — Telehealth: Payer: Self-pay | Admitting: Family Medicine

## 2018-03-25 DIAGNOSIS — I69318 Other symptoms and signs involving cognitive functions following cerebral infarction: Secondary | ICD-10-CM | POA: Diagnosis present

## 2018-03-25 DIAGNOSIS — R41842 Visuospatial deficit: Secondary | ICD-10-CM | POA: Insufficient documentation

## 2018-03-25 DIAGNOSIS — R2681 Unsteadiness on feet: Secondary | ICD-10-CM | POA: Insufficient documentation

## 2018-03-25 DIAGNOSIS — M25511 Pain in right shoulder: Secondary | ICD-10-CM | POA: Diagnosis present

## 2018-03-25 DIAGNOSIS — R29818 Other symptoms and signs involving the nervous system: Secondary | ICD-10-CM | POA: Diagnosis present

## 2018-03-25 DIAGNOSIS — R2689 Other abnormalities of gait and mobility: Secondary | ICD-10-CM | POA: Insufficient documentation

## 2018-03-25 DIAGNOSIS — G8929 Other chronic pain: Secondary | ICD-10-CM | POA: Insufficient documentation

## 2018-03-25 DIAGNOSIS — M6281 Muscle weakness (generalized): Secondary | ICD-10-CM

## 2018-03-25 DIAGNOSIS — I69351 Hemiplegia and hemiparesis following cerebral infarction affecting right dominant side: Secondary | ICD-10-CM | POA: Diagnosis present

## 2018-03-25 DIAGNOSIS — R208 Other disturbances of skin sensation: Secondary | ICD-10-CM | POA: Diagnosis present

## 2018-03-25 NOTE — Telephone Encounter (Signed)
Spoke with Sharyn Lull at [facility] they do the DME for [facility] rehab this is DME supplies that was given to patient from referral we placed 12/19/17

## 2018-03-25 NOTE — Therapy (Signed)
[facility] [address] [place], [place], [address] Phone: [phone]   Fax:  [phone]  Occupational Therapy Treatment  Patient Details  Name: Margaret Ortiz MRN: [id number] Date of Birth: 09-30-1960 No data recorded  Encounter Date: 03/25/2018  OT End of Session - 03/25/18 1819    Visit Number  39    Number of Visits  42    Date for OT Re-Evaluation  04/24/18    Authorization Type  BC/BS    OT Start Time  1329    OT Stop Time  1415    OT Time Calculation (min)  46 min    Activity Tolerance  Patient tolerated treatment well       Past Medical History:  Diagnosis Date  . Aphasia 09/17/2017  . 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  . Cardiac LV ejection fraction 21-30%   . Cardioembolic stroke ([place]) 70/96/2836   Right hemiparesis  . Cardiomyopathy ([place]) 04/2017  . Dysphagia 05/19/2017   Last known diet upgraded to regular diet with thin liquids on 06/27/2017, no records available  . 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 Fall 09/17/2017  . Graves disease   . H/O ischemic left MCA stroke 09/17/2017  . Hemiparesis of right dominant side as late effect of cerebral infarction ([place]) 09/17/2017  . Hyperlipidemia   . Hypertension   . Neuropathy 05/19/2017   s/p stroke  . Plaque psoriasis   . Shoulder subluxation, right, initial encounter 09/17/2017  . Weakness 09/17/2017    Past Surgical History:  Procedure Laterality Date  . NO PAST SURGERIES      There were no vitals filed for this visit.  Subjective Assessment - 03/25/18 1817    Subjective   Oh ok (nodding head yes)    Patient is accompained by:  Family member   sister and brother in law   Pertinent History  Lt MCA CVA 05/19/2017, Graves dz    Patient Stated Goals  use my Rt arm    Currently in Pain?  No/denies        Treatment:  Pt seen  for aquatic therapy today.  Pt entered the pool via steps using 1 hand rail and with min a to guide RLE onto step, as well as cues for postural alignment and control in her descent.  Treatment occurred in water 2.5-5 feet deep depending upon activity. Treatment focused on postural alignment and control with forward and backward functional ambulation with LUE in weight bearing on pool side wall.  Also focused on increasing activity and stable mobility and mobility of R side including RLE, Trunk and a proximal RUE.  Pt with improving alignment and control as evidenced by decreasing need for facilitation, tactile cueing and reliance on LUE.  Addressed RLE strengthening with hip extension and hip abduction - pt with significant improvement in ability to initiate and carry with isolated movement in pool - refractory and buoyancy principles assist with this.  Pt then able to progress to open water functional ambulation using only floating dumb bells for UE support (assisted with RUE) and min tactile and vc's to fully weight bear on RLE before stepping with LLE.  Treatment also focused on isolated UE movement for chest presses while seated in water using dumb bells - pt able to initiate and carry through movement with min facilitation today.  Water provided resistance for increased proximal  control without increasing spasticity.  Pt exited water using 1 hand rail  (cueing to decrease reliance on LUE) using RLE as "power leg" ascending on stairs with mod facilitation.                      OT Short Term Goals - 03/25/18 1818      OT SHORT TERM GOAL #1   Title  Pt/family independent with HEP for RUE - 11/27/17    Status  Achieved      OT SHORT TERM GOAL #2   Title  Independent with splint wear and care for Rt hand prn    Status  Achieved      OT SHORT TERM GOAL #3   Title  Pt/family to verbalize understanding with A/E and task modifications to increase indpendence with ADLS/IADLS (for bathing,  tying shoes, cutting food, simple meal prep)    Status  Achieved      OT SHORT TERM GOAL #4   Title  Pt to perform bathing with only min assist and A/E prn while seated     Status  Achieved      OT SHORT TERM GOAL #5   Title  Pt to make sandwich w/ only supervision/cues prn    Status  Achieved      OT SHORT TERM GOAL #6   Title  Pt to demo 25 degrees shoulder flexion in prep for low level reaching with gross finger flexion - goals due 01/21/2018    Time  4    Period  Weeks    Status  Not Met      OT SHORT TERM GOAL #7   Title  Pt to perform environmental scanning at 90% accuracy during ambulation    Time  4    Period  Weeks    Status  Not Met   77%      OT SHORT TERM GOAL #8   Title  Pt to demo initiation of proximal movement of UE, scapula and shoulder girdle stability for trunk stabilization with functional ambulation - 03/24/18    Time  4    Period  Weeks    Status  Achieved        OT Long Term Goals - 03/25/18 1818      OT LONG TERM GOAL #1   Title  Pt to perform shower transfers at mod I level w/ DME prn and no LOB - goals due 02/18/2018    Time  8    Period  Weeks    Status  Not Met   Pt requires supervision to min assist for safety.      OT LONG TERM GOAL #2   Title  Pt to perform simple meal prep at sup level using A/E and task modifications prn    Time  8    Period  Weeks    Status  Achieved      OT LONG TERM GOAL #3   Title  Pt to perform light house management tasks at sup level including: laundry, washing dishes, cleaning, making bed    Time  8    Period  Weeks    Status  Achieved   per pt report     OT LONG TERM GOAL #4   Title  Pt to demo 25% finger extension in prep for releasing objects Rt hand     Time  8    Period  Weeks    Status  Not Met  no active finger extension     OT LONG TERM GOAL #5   Title  (LTG #5 moved to STG for renewal period)      OT LONG TERM GOAL #6   Title  Pt to use Rt hand as stabalizer 25% of the time    Time  8     Period  Weeks    Status  Not Met      OT LONG TERM GOAL #7   Title  Pt/family to verbalize understanding with neccessary accommodations, A/E, etc that would be needed if pt is to return to living alone    Time  8    Period  Weeks    Status  Deferred   Pt will not be safe to return to living alone at this time     OT LONG TERM GOAL #8   Title  Pt/family independent with home use of NMES for Rt wrist and finger extension - due 02/26/18    Time  1    Period  Weeks    Status  Achieved      OT LONG TERM GOAL  #9   Baseline  Pt independent with water based HEP w/ assist from family prn - due by 04/24/18    Time  8    Period  Weeks    Status  On-going      OT LONG TERM GOAL  #10   TITLE  Pt will improve postural control/alignment for functional mobility to decrease risk of falls during ADLS/IADL activities - due by 04/24/18    Time  8    Period  Weeks    Status  On-going            Plan - 03/25/18 1818    Clinical Impression Statement  Pt continue to progress toward goals and is demonstating improved postural alignment and control for functional mobility    Occupational Profile and client history currently impacting functional performance  no significant PMH but current impairments extensive    Occupational performance deficits (Please refer to evaluation for details):  ADL's;IADL's;Work;Leisure;Social Participation    Rehab Potential  Fair    Current Impairments/barriers affecting progress:  severity of deficits    OT Frequency  1x / week    OT Duration  8 weeks    OT Treatment/Interventions  Aquatic Therapy;Self-care/ADL training;DME and/or AE instruction;Therapeutic activities;Neuromuscular education;Patient/family education;Electrical Stimulation    Plan  Aquatic therapy to address remaining goals (STG #8, and LTG's #9 and #10)    Consulted and Agree with Plan of Care  Patient;Family member/caregiver    Family Member Consulted  sister and brother in law       Patient  will benefit from skilled therapeutic intervention in order to improve the following deficits and impairments:  Decreased coordination, Decreased range of motion, Difficulty walking, Improper body mechanics, Decreased endurance, Decreased safety awareness, Impaired sensation, Improper spinal/pelvic alignment, Impaired tone, Decreased knowledge of precautions, Decreased knowledge of use of DME, Decreased balance, Impaired UE functional use, Pain, Decreased cognition, Decreased mobility, Decreased strength, Impaired perceived functional ability, Impaired vision/preception  Visit Diagnosis: Unsteadiness on feet  Muscle weakness (generalized)  Hemiplegia and hemiparesis following cerebral infarction affecting right dominant side (HCC)  Other symptoms and signs involving the nervous system  Visuospatial deficit  Other disturbances of skin sensation  Chronic right shoulder pain  Other symptoms and signs involving cognitive functions following cerebral infarction    Problem List Patient Active Problem List   Diagnosis Date Noted  .  Fall 09/17/2017  . Hemiparesis of right dominant side as late effect of cerebral infarction ([place]) 09/17/2017  . Shoulder subluxation, right, initial encounter 09/17/2017  . Cardioembolic stroke ([place]) 45/99/7741  . Weakness 09/17/2017  . Aphasia 09/17/2017  . 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 Cardiomyopathy ([place]) 09/17/2017  . H/O ischemic left MCA stroke 09/17/2017  . Cardiac LV ejection fraction 21-30%   . Graves disease   . Plaque psoriasis   . Neuropathy 05/19/2017    Quay Burow, OTR/L 03/25/2018, 9:06 PM  [facility] [address] [place], [place], [address] Phone: [phone]   Fax:  [phone]  Name: Adely Facer MRN: [id number] Date of Birth: 1961-04-10

## 2018-03-25 NOTE — Telephone Encounter (Signed)
Copied from [facility] [phone]. Topic: General - Other >> Mar 25, 2018  9:58 AM Cecelia Byars, NT wrote: Reason for CRM: Sharyn Lull from [facility] is calling again to follow up on the paper work that  was originally  faxed on  03/03/18 and 03/10/18/as well as  ,03/16/18/  she has also called several times and left several messages .She  received a call telling  her the Dr would return on  03/24/18 and she would like the forms signed and faxed back for A.F.O  please fax to [phone] or call [phone]

## 2018-03-25 NOTE — Telephone Encounter (Signed)
Orders signed and faxed back to [facility]

## 2018-03-30 ENCOUNTER — Ambulatory Visit: Payer: BLUE CROSS/BLUE SHIELD | Admitting: Family Medicine

## 2018-03-30 ENCOUNTER — Ambulatory Visit: Payer: BLUE CROSS/BLUE SHIELD | Admitting: Physical Therapy

## 2018-03-30 ENCOUNTER — Encounter: Payer: Self-pay | Admitting: Physical Therapy

## 2018-03-30 DIAGNOSIS — R2681 Unsteadiness on feet: Secondary | ICD-10-CM | POA: Diagnosis not present

## 2018-03-30 DIAGNOSIS — I69351 Hemiplegia and hemiparesis following cerebral infarction affecting right dominant side: Secondary | ICD-10-CM

## 2018-03-30 DIAGNOSIS — M6281 Muscle weakness (generalized): Secondary | ICD-10-CM

## 2018-03-30 NOTE — Therapy (Signed)
[facility] [address] [place], [place], [address] Phone: [phone]   Fax:  [phone]  Physical Therapy Treatment  Patient Details  Name: Margaret Ortiz MRN: [id number] Date of Birth: [birth year] Referring Provider (PT): Howard Pouch, DO   Encounter Date: 03/30/2018  PT End of Session - 03/30/18 2020    Visit Number  37    Number of Visits  41    Date for PT Re-Evaluation  04/27/18    Authorization Type  BCBS     PT Start Time  1321    PT Stop Time  1359    PT Time Calculation (min)  38 min    Equipment Utilized During Treatment  --   min guard assist due to pt refuses belt   Activity Tolerance  Patient tolerated treatment well    Behavior During Therapy  [facility] for tasks assessed/performed       Past Medical History:  Diagnosis Date  . Aphasia 09/17/2017  . 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  . Cardiac LV ejection fraction 21-30%   . Cardioembolic stroke ([place]) 57/06/7791   Right hemiparesis  . Cardiomyopathy ([place]) 04/2017  . Dysphagia 05/19/2017   Last known diet upgraded to regular diet with thin liquids on 06/27/2017, no records available  . 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 Fall 09/17/2017  . Graves disease   . H/O ischemic left MCA stroke 09/17/2017  . Hemiparesis of right dominant side as late effect of cerebral infarction ([place]) 09/17/2017  . Hyperlipidemia   . Hypertension   . Neuropathy 05/19/2017   s/p stroke  . Plaque psoriasis   . Shoulder subluxation, right, initial encounter 09/17/2017  . Weakness 09/17/2017    Past Surgical History:  Procedure Laterality Date  . NO PAST SURGERIES      There were no vitals filed for this visit.  Subjective Assessment - 03/30/18 1327    Subjective  No new complaints. No falls or pain to report.     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      Currently in Pain?  No/denies                       [facility] Adult PT Treatment/Exercise - 03/30/18 2055      Ambulation/Gait   Ambulation/Gait Assistance  4: Min assist;4: Min guard    Ambulation/Gait Assistance Details  facilitation for rt knee flexion in swing    Ambulation Distance (Feet)  100 Feet   60   Assistive device  Straight cane    Gait Pattern  Decreased stance time - right;Decreased step length - left;Decreased hip/knee flexion - right;Decreased dorsiflexion - right;Decreased weight shift to right;Right circumduction;Right hip hike;Lateral hip instability;Poor foot clearance - right    Ambulation Surface  Level;Indoor    Pre-Gait Activities  in // bars, step LLE and allow RLE relaxation at hip and flexion at rt knee to prepare for RLE swing; progressed to same with pt using SPC in left hand (incr difficulty with anxiety causing pt to rush through sequence and resort to rt hip hiking--with multiple reps, began to relax through rt hip prior to swing      Exercises   Other Exercises  tall-kneeling on mat table with k-bench for LUE support; "side-stepping" in tall kneeling both left and right x 4 lengths each; tall kneeling to 1/2 kneeling with RLE stance leg and LLE lifted up to 1/2 kneeling x 10 reps with facilitation to maintian rt hip abdct and extension      Knee/Hip Exercises: Standing   Knee Flexion  AAROM;Strengthening;Right;2 sets;10 reps    Knee Flexion Limitations  unable to achieve prone due to RUE weakness and possible injury to shoulder--stood in front of chair, bent forward at hips with hands on armrests (PT assisted RUE) with rt knee flexion AAROM      Knee/Hip Exercises: Supine   Heel Slides  AAROM;Right;1 set;10 reps   pt with difficulty initiating knee flexion--overuses rt hip             PT Education - 03/30/18 2053    Education Details  provided handout outlining OT aquatic therapy end date and PT end date (10/31);     Person(s) Educated  Patient    Methods  Explanation;Handout    Comprehension  Need further instruction       PT Short Term Goals - 02/26/18 0945      PT SHORT TERM GOAL #1   Title  The patient will ambulate on level community surfaces with SPC mod indep for improved mobility.    Baseline  Using [facility] in community for limited surfaces.    Time  4    Period  Weeks    Status  Achieved      PT SHORT TERM GOAL #2   Title  The patient will improve gait speed from 0.85 ft/sec to > or equal to 1.2 ft/sec to demonstrate transition from "household ambulator" to "limited community ambulator" classification of gait.    Baseline  1.17 ft/sec on 01/28/18    Time  4    Period  Weeks    Status  Partially Met      PT SHORT TERM GOAL #3   Title  The patient will improve Berg balance score from 42/56 to > or equal to 45/56 to demo dec'ing risk for falls.    Baseline  Berg=45/56 on 01/28/18    Time  4    Period  Weeks    Status  Achieved      PT SHORT TERM GOAL #4   Title  The patient will ambulate x 600 ft nonstop to demo improved endurance for functional gait.    Baseline  Patient able to ambulate nonstop x 600 ft mod indep on level surfaces with SPc.     Time  4    Period  Weeks    Status  Achieved        PT Long Term Goals - 03/23/18 1323      PT LONG TERM GOAL #1   Title  The patient will perform updated HEP with intermittent assist for post d/c progression of exercise.    Baseline  03/23/18: met as patient is doing prescribed exercises and walking as well as aquatic therapy, may need to be advanced after next 4 week cert.     Status  Achieved      PT LONG TERM GOAL #2   Title  The patient will move floor<>stand with mod indep using L UE support on surface due to h/o falls.    Baseline  03/23/18: performed x3 reps today with min guard  assist downgrading to supervision, cues on sequencing with left UE support on mat  table. no change since goal checked/set on 02/26/18.    Time  --    Period  --    Status  Not Met      PT LONG TERM GOAL #3   Title  The patient will improve gait speed from 0.85 ft/sec to > or equal to 2.5 ft/sec to demo dec'ing risk for falls.    Baseline  03/23/18: 1.92 ft/sec with cane/AFO with supervision, less that when was checked on 02/19/18 (1.95 ft/sec)    Time  --    Period  --    Status  Not Met      PT LONG TERM GOAL #4   Title  The patient will improve Berg score from 49/56 (on 02/25/2018) up to 52/56 to demo dec'ing risk for falls.    Baseline  03/23/18: 50/56 scored today. improved from 49/56 on 02/25/18.    Time  --    Period  --    Status  Partially Met      PT LONG TERM GOAL #5   Title  The patient will negotiate 12 steps with a handrail (light support only)with reciprocal pattern mod indep to access home entries.    Baseline  03/23/18: cont's to be Mod I with single rail/step to pattern. min guard to min assist when using reciprocal pattern with cues needed.    Time  --    Period  --    Status  Not Met            Plan - 03/30/18 2041    Clinical Impression Statement  Session focused on RLE strengthening for carryover to gait to ultimately improve safety and decr fall risk. Strength training for rt knee flexion to reduce hip hike to initiate RLE swing and hip extension/abduction for stability in stance phase. Patient inquiring when she can stop using her AFO and yet demonstrates no active dorsiflexion. Explained will likely need a brace forever as she has had no return of dorsiflexion. Discussed re-certification with primary PT and she will complete goal update.     Rehab Potential  Good    Clinical Impairments Affecting Rehab Potential  Patient motivated to participate,  aphasia continues to be a barrier    PT Frequency  2x / week    PT Duration  4 weeks    PT Treatment/Interventions   ADLs/Self Care Home Management;Gait training;Stair training;Functional mobility training;Therapeutic activities;Therapeutic exercise;Balance training;Neuromuscular re-education;Patient/family education;Orthotic Fit/Training;Electrical Stimulation;Manual techniques;DME Instruction    PT Next Visit Plan  primary PT to recert; continue to work on improved right hip protraction (extension) in stance, right knee flexion without hip hike for swing phase of gait, high level balance. continue with tall kneelking for right hip activation, right LE strengthening. check into pt interest in community fitness and current options available to her    Consulted and Agree with Plan of Care  Patient       Patient will benefit from skilled therapeutic intervention in order to improve the following deficits and impairments:  Abnormal gait, Impaired sensation, Pain, Postural dysfunction, Impaired tone, Decreased mobility, Decreased coordination, Decreased activity tolerance, Decreased endurance, Decreased strength, Difficulty walking, Decreased balance  Visit Diagnosis: Unsteadiness on feet  Muscle weakness (generalized)  Hemiplegia and hemiparesis following cerebral infarction affecting right dominant side [facility])     Problem List Patient Active Problem List   Diagnosis Date Noted  . Fall 09/17/2017  . Hemiparesis of right dominant side as late effect of cerebral infarction ([place]) 09/17/2017  . Shoulder  subluxation, right, initial encounter 09/17/2017  . Cardioembolic stroke ([place]) 85/92/7639  . Weakness 09/17/2017  . Aphasia 09/17/2017  . 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 Cardiomyopathy ([place]) 09/17/2017  . H/O ischemic left MCA stroke 09/17/2017  . Cardiac LV ejection fraction 21-30%   . Graves disease   . Plaque psoriasis   . Neuropathy 05/19/2017    Rexanne Mano, PT 03/30/2018, 9:08 PM  [facility] [address] [place], [place], [address] Phone: [phone]   Fax:  [phone]  Name: Margaret Ortiz MRN: [id number] Date of Birth: 1960-11-01

## 2018-03-31 ENCOUNTER — Ambulatory Visit (INDEPENDENT_AMBULATORY_CARE_PROVIDER_SITE_OTHER): Payer: BLUE CROSS/BLUE SHIELD | Admitting: Family Medicine

## 2018-03-31 ENCOUNTER — Encounter: Payer: Self-pay | Admitting: Family Medicine

## 2018-03-31 ENCOUNTER — Telehealth: Payer: Self-pay | Admitting: Family Medicine

## 2018-03-31 VITALS — BP 110/66 | HR 67 | Temp 97.9°F | Resp 20 | Ht 62.0 in | Wt 124.0 lb

## 2018-03-31 DIAGNOSIS — Z23 Encounter for immunization: Secondary | ICD-10-CM

## 2018-03-31 DIAGNOSIS — I639 Cerebral infarction, unspecified: Secondary | ICD-10-CM | POA: Diagnosis not present

## 2018-03-31 DIAGNOSIS — I69351 Hemiplegia and hemiparesis following cerebral infarction affecting right dominant side: Secondary | ICD-10-CM

## 2018-03-31 DIAGNOSIS — I69891 Dysphagia following other cerebrovascular disease: Secondary | ICD-10-CM

## 2018-03-31 DIAGNOSIS — Z79899 Other long term (current) drug therapy: Secondary | ICD-10-CM | POA: Diagnosis not present

## 2018-03-31 DIAGNOSIS — R4589 Other symptoms and signs involving emotional state: Secondary | ICD-10-CM

## 2018-03-31 DIAGNOSIS — R471 Dysarthria and anarthria: Secondary | ICD-10-CM

## 2018-03-31 DIAGNOSIS — Z7901 Long term (current) use of anticoagulants: Secondary | ICD-10-CM

## 2018-03-31 LAB — BASIC METABOLIC PANEL
BUN: 18 mg/dL (ref 6–23)
CALCIUM: 10.1 mg/dL (ref 8.4–10.5)
CO2: 30 meq/L (ref 19–32)
Chloride: 102 mEq/L (ref 96–112)
Creatinine, Ser: 0.94 mg/dL (ref 0.40–1.20)
GFR: 65.22 mL/min (ref >60.00)
Glucose, Bld: 94 mg/dL (ref 70–99)
Potassium: 4.8 mEq/L (ref 3.5–5.1)
SODIUM: 138 meq/L (ref 135–145)

## 2018-03-31 MED ORDER — APIXABAN 5 MG PO TABS: 5.0000 mg | ORAL_TABLET | Freq: Two times a day (BID) | ORAL | 3 refills | Status: DC

## 2018-03-31 MED ORDER — ESOMEPRAZOLE MAGNESIUM 40 MG PO CPDR: 40.0000 mg | DELAYED_RELEASE_CAPSULE | Freq: Every day | ORAL | 3 refills | Status: DC

## 2018-03-31 MED ORDER — ATORVASTATIN CALCIUM 40 MG PO TABS: 40.0000 mg | ORAL_TABLET | Freq: Every day | ORAL | 3 refills | Status: DC

## 2018-03-31 MED ORDER — BACLOFEN 5 MG PO TABS: 5.0000 mg | ORAL_TABLET | Freq: Three times a day (TID) | ORAL | 1 refills | Status: DC

## 2018-03-31 MED ORDER — GABAPENTIN 300 MG PO CAPS: 600.0000 mg | ORAL_CAPSULE | Freq: Every day | ORAL | 1 refills | Status: DC

## 2018-03-31 NOTE — Patient Instructions (Signed)
I will call you with lab results.  I will look into the referral and attempt to get you to the best option.  I have refilled your meds.   Follow up in 6 months.   You are making great progress!!!  Please help [place] help you:  We are honored you have chosen [facility] for your Primary Care home. Below you will find basic instructions that you may need to access in the future. Please help [place] help you by reading the instructions, which cover many of the frequent questions we experience.   Prescription refills and request:  -In order to allow more efficient response time, please call your pharmacy for all refills. They will forward the request electronically to [place]. This allows for the quickest possible response. Request left on a nurse line can take longer to refill, since these are checked as time allows between office patients and other phone calls.  - refill request can take up to 3-5 working days to complete.  - If request is sent electronically and request is appropiate, it is usually completed in 1-2 business days.  - all patients will need to be seen routinely for all chronic medical conditions requiring prescription medications (see follow-up below). If you are overdue for follow up on your condition, you will be asked to make an appointment and we will call in enough medication to cover you until your appointment (up to 30 days).  - all controlled substances will require a face to face visit to request/refill.  - if you desire your prescriptions to go through a new pharmacy, and have an active script at original pharmacy, you will need to call your pharmacy and have scripts transferred to new pharmacy. This is completed between the pharmacy locations and not by your provider.    Results: If any images or labs were ordered, it can take up to 1 week to get results depending on the test ordered and the lab/facility running and resulting the test. - Normal or stable results, which do not  need further discussion, may be released to your mychart immediately with attached note to you. A call may not be generated for normal results. Please make certain to sign up for mychart. If you have questions on how to activate your mychart you can call the front office.  - If your results need further discussion, our office will attempt to contact you via phone, and if unable to reach you after 2 attempts, we will release your abnormal result to your mychart with instructions.  - All results will be automatically released in mychart after 1 week.  - Your provider will provide you with explanation and instruction on all relevant material in your results. Please keep in mind, results and labs may appear confusing or abnormal to the untrained eye, but it does not mean they are actually abnormal for you personally. If you have any questions about your results that are not covered, or you desire more detailed explanation than what was provided, you should make an appointment with your provider to do so.   Our office handles many outgoing and incoming calls daily. If we have not contacted you within 1 week about your results, please check your mychart to see if there is a message first and if not, then contact our office.  In helping with this matter, you help decrease call volume, and therefore allow [place] to be able to respond to patients needs more efficiently.   Acute office visits (  sick visit):  An acute visit is intended for a new problem and are scheduled in shorter time slots to allow schedule openings for patients with new problems. This is the appropriate visit to discuss a new problem. Problems will not be addressed by phone call or Echart message. Appointment is needed if requesting treatment. In order to provide you with excellent quality medical care with proper time for you to explain your problem, have an exam and receive treatment with instructions, these appointments should be limited to one new  problem per visit. If you experience a new problem, in which you desire to be addressed, please make an acute office visit, we save openings on the schedule to accommodate you. Please do not save your new problem for any other type of visit, let us take care of it properly and quickly for you.   Follow up visits:  Depending on your condition(s) your provider will need to see you routinely in order to provide you with quality care and prescribe medication(s). Most chronic conditions (Example: hypertension, Diabetes, depression/anxiety... etc), require visits a couple times a year. Your provider will instruct you on proper follow up for your personal medical conditions and history. Please make certain to make follow up appointments for your condition as instructed. Failing to do so could result in lapse in your medication treatment/refills. If you request a refill, and are overdue to be seen on a condition, we will always provide you with a 30 day script (once) to allow you time to schedule.    Medicare wellness (well visit): - we have a wonderful Nurse Maudie Mercury), that will meet with you and provide you will yearly medicare wellness visits. These visits should occur yearly (can not be scheduled less than 1 calendar year apart) and cover preventive health, immunizations, advance directives and screenings you are entitled to yearly through your medicare benefits. Do not miss out on your entitled benefits, this is when medicare will pay for these benefits to be ordered for you.  These are strongly encouraged by your provider and is the appropriate type of visit to make certain you are up to date with all preventive health benefits. If you have not had your medicare wellness exam in the last 12 months, please make certain to schedule one by calling the office and schedule your medicare wellness with Maudie Mercury as soon as possible.   Yearly physical (well visit):  - Adults are recommended to be seen yearly for physicals.  Check with your insurance and date of your last physical, most insurances require one calendar year between physicals. Physicals include all preventive health topics, screenings, medical exam and labs that are appropriate for gender/age and history. You may have fasting labs needed at this visit. This is a well visit (not a sick visit), new problems should not be covered during this visit (see acute visit).  - Pediatric patients are seen more frequently when they are younger. Your provider will advise you on well child visit timing that is appropriate for your their age. - This is not a medicare wellness visit. Medicare wellness exams do not have an exam portion to the visit. Some medicare companies allow for a physical, some do not allow a yearly physical. If your medicare allows a yearly physical you can schedule the medicare wellness with our nurse Maudie Mercury and have your physical with your provider after, on the same day. Please check with insurance for your full benefits.   Late Policy/No Shows:  - all new  patients should arrive 15-30 minutes earlier than appointment to allow [place] time  to  obtain all personal demographics,  insurance information and for you to complete office paperwork. - All established patients should arrive 10-15 minutes earlier than appointment time to update all information and be checked in .  - In our best efforts to run on time, if you are late for your appointment you will be asked to either reschedule or if able, we will work you back into the schedule. There will be a wait time to work you back in the schedule,  depending on availability.  - If you are unable to make it to your appointment as scheduled, please call 24 hours ahead of time to allow [place] to fill the time slot with someone else who needs to be seen. If you do not cancel your appointment ahead of time, you may be charged a no show fee.

## 2018-03-31 NOTE — Progress Notes (Signed)
Margaret Ortiz, Margaret Ortiz 06, 1962, 57 y.o., female MRN: [id number] Patient Care Team    Relationship Specialty Notifications Start End  Margaret Hillock, DO PCP - General Family Medicine  09/16/17     Chief Complaint  Patient presents with  . Follow-up    chronic conditions     Subjective: Pt presents with her sister today, who helps with HPI.    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  Really coming along nicely with her strength.  She is still affected and her right upper and lower extremity from her stroke.  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  Her family feels she is not communicating as well because she is upset about the way she sounds with her speech.  She is fluent in multiple languages, so it is not a comprehension issue, but felt to be a confidence issue.  She is compliant with atorvastatin, baclofen, gabapentin, PPI. lopressor and eliquis. Cardiomyopathy, unspecified type (HCC)/Cardiac LV ejection fraction 21-30% She is established with cardiology last echocardiogram 02/03/2018 - Left ventricle: The cavity size was normal. Wall thickness was   normal. Systolic function was severely reduced. The estimated   ejection fraction was in the range of 25% to 30%. Diffuse   hypokinesis. Features are consistent with a pseudonormal left   ventricular filling pattern, with concomitant abnormal relaxation   and increased filling pressure (grade 2 diastolic dysfunction).   Doppler parameters are consistent with high ventricular filling   pressure. - Mitral valve: There was mild regurgitation. - Left atrium: The atrium was mildly dilated. Impressions: - Severe global reduction in LV systolic function; moderate   diastolic dysfunction; mild MR; mild LAE.  Depression screen [facility] 2/9 09/16/2017  Decreased  Interest 0  Down, Depressed, Hopeless 0  PHQ - 2 Score 0    Allergies  Allergen Reactions  . Dust Mite Extract   . Penicillins    Social History   Tobacco Use  . Smoking status: Former Research scientist (life sciences)  . Smokeless tobacco: Never Used  Substance Use Topics  . Alcohol use: Never    Frequency: Never   Past Medical History:  Diagnosis Date  . Aphasia 09/17/2017  . 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  . Cardiac LV ejection fraction 21-30%   . Cardioembolic stroke ([place]) 00/76/2263   Right hemiparesis  . Cardiomyopathy ([place]) 04/2017  . Dysphagia 05/19/2017   Last known diet upgraded to regular diet with thin liquids on 06/27/2017, no records available  . 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 Fall 09/17/2017  . Graves disease   . H/O ischemic left MCA stroke 09/17/2017  . Hemiparesis of right dominant side as late effect of cerebral infarction ([place]) 09/17/2017  . Hyperlipidemia   . Hypertension   . Neuropathy 05/19/2017   s/p stroke  . Plaque psoriasis   . Shoulder subluxation, right, initial encounter 09/17/2017  . Weakness 09/17/2017   Past Surgical History:  Procedure Laterality Date  . NO PAST SURGERIES     Family History  Problem Relation Age of Onset  . Hyperlipidemia Mother   . Hypertension Mother   . Early death Father   . Hypertension Sister   . Heart attack Brother   . Mental illness Brother    Allergies as of 03/31/2018  Reactions   Dust Mite Extract    Penicillins       Medication List        Accurate as of 03/31/18 11:35 AM. Always use your most recent med list.          acetaminophen 325 MG tablet Commonly known as:  TYLENOL Take 650 mg by mouth every 6 (six) hours as needed.   apixaban 5 MG Tabs tablet Commonly known as:  ELIQUIS Take 1 tablet (5 mg total) by mouth 2 (two) times daily.   atorvastatin 40 MG tablet Commonly known as:   LIPITOR Take 1 tablet (40 mg total) by mouth daily.   Baclofen 5 MG Tabs Take 5 mg by mouth 3 (three) times daily.   clobetasol cream 0.05 % Commonly known as:  TEMOVATE Apply 1 application topically 2 (two) times daily.   esomeprazole 40 MG capsule Commonly known as:  NEXIUM Take 1 capsule (40 mg total) by mouth daily at 12 noon.   gabapentin 300 MG capsule Commonly known as:  NEURONTIN Take 2 capsules (600 mg total) by mouth at bedtime.   metoprolol tartrate 25 MG tablet Commonly known as:  LOPRESSOR Take 25 mg by mouth 2 (two) times daily.   OVER THE COUNTER MEDICATION CBD Oil       All past medical history, surgical history, allergies, family history, immunizations andmedications were updated in the EMR today and reviewed under the history and medication portions of their EMR.     ROS: Negative, with the exception of above mentioned in HPI   Objective:  BP 110/66 (BP Location: Left Arm, Patient Position: Sitting, Cuff Size: Normal)   Pulse 67   Temp 97.9 F (36.6 C)   Resp 20   Ht 5\' 2"  (1.575 m)   Wt 124 lb (56.2 kg)   SpO2 98%   BMI 22.68 kg/m  Body mass index is 22.68 kg/m.  Gen: Afebrile. No acute distress.  Nontoxic and presentation.  Pleasant Caucasian female. HENT: AT. [place]. MMM.  Eyes:Pupils Equal Round Reactive to light, Extraocular movements intact,  Conjunctiva without redness, discharge or icterus. CV: RRR no murmur, no edema, +2/4 P posterior tibialis pulses Chest: CTAB, no wheeze or crackles Abd: Soft. NTND. BS present.  No masses palpated.  Skin: No rashes, purpura or petechiae.  Neuro: Walking independently with cane, doing very well. PERLA. EOMi. Alert. Oriented.  Approved right upper extremity strength, although still weak and needing assistance.  Right lower extremity foot/heel AFO in place. Psych: Normal affect, dress and demeanor.  Mildly slurred speech. Normal thought content and judgment.   No exam data present No results found. No  results found for this or any previous visit (from the past 24 hour(s)).  Assessment/Plan: Margaret Ortiz is a 57 y.o. 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 -Continue gabapentin 600 mg nightly. -Continue baclofen 5 mg TID. -Continue metoprolol 25 mg twice daily, prescribed by cardiology -She is established with both cardiology and neurology. -Referral to psychology, Dr. Gaynell Face, to help with comfidence surrounding speech.  She is fluent in multiple languages and English is not her first language.  She does well but has some lacking confidence secondary to mild slur since her stroke.  Was also provided with resources for the [facility]  stroke support group For family and patients.  Cardiomyopathy, unspecified type (HCC)/Cardiac LV ejection fraction 21-30% -Continue to follow with cardiology.  Viewed last echo which still had diminished ejection fraction.  Neuropathy Stable.  Gabapentin 600 mg nightly continued.  Refilled today.  Follow up 6 months, unless needing additional referrals to outpt PT/OT/speech, then needs seen every 3 months.   Flu shot provided today.   Reviewed expectations re: course of current medical issues.  Discussed self-management of symptoms.  Outlined signs and symptoms indicating need for more acute intervention.  Patient verbalized understanding and all questions were answered.  Patient received an After-Visit Summary.    Orders Placed This Encounter  Procedures  . Flu Vaccine QUAD 6+ mos PF IM (Fluarix Quad PF)    > 25 minutes spent with patient, >50% of time spent face to face counseling and coordinating care.    Note is dictated utilizing voice  recognition software. Although note has been proof read prior to signing, occasional typographical errors still can be missed. If any questions arise, please do not hesitate to call for verification.   electronically signed by:  Howard Pouch, DO  [facility]

## 2018-03-31 NOTE — Telephone Encounter (Signed)
Please inform patient the following information: Her labs are normal.  I have spoke with the psychologist in our office and she would be happy to help her and I did not see evidence that the other provider specialized- so no need to refer there specifically. I think they will like Dr. Gaynell Face and it will be a good fit for her. I referred her to her- she is located in the same building as [place].  I have also found a support group through [facility] that meets a few times a month for patients and families that have been affected by a stroke, which may also be helpful--> that contact info is: Caitlin Penven-Crew [phone]

## 2018-04-01 ENCOUNTER — Ambulatory Visit: Payer: BLUE CROSS/BLUE SHIELD | Admitting: Occupational Therapy

## 2018-04-01 ENCOUNTER — Encounter: Payer: Self-pay | Admitting: Occupational Therapy

## 2018-04-01 DIAGNOSIS — M25511 Pain in right shoulder: Secondary | ICD-10-CM

## 2018-04-01 DIAGNOSIS — R41842 Visuospatial deficit: Secondary | ICD-10-CM

## 2018-04-01 DIAGNOSIS — G8929 Other chronic pain: Secondary | ICD-10-CM

## 2018-04-01 DIAGNOSIS — R2681 Unsteadiness on feet: Secondary | ICD-10-CM

## 2018-04-01 DIAGNOSIS — I69351 Hemiplegia and hemiparesis following cerebral infarction affecting right dominant side: Secondary | ICD-10-CM

## 2018-04-01 DIAGNOSIS — I69318 Other symptoms and signs involving cognitive functions following cerebral infarction: Secondary | ICD-10-CM

## 2018-04-01 DIAGNOSIS — R208 Other disturbances of skin sensation: Secondary | ICD-10-CM

## 2018-04-01 DIAGNOSIS — R29818 Other symptoms and signs involving the nervous system: Secondary | ICD-10-CM

## 2018-04-01 DIAGNOSIS — M6281 Muscle weakness (generalized): Secondary | ICD-10-CM

## 2018-04-01 NOTE — Therapy (Signed)
[facility] [address] [place], [place], [address] Phone: [phone]   Fax:  [phone]  Occupational Therapy Treatment  Patient Details  Name: Margaret Ortiz MRN: [id number] Date of Birth: 1961/03/13 No data recorded  Encounter Date: 04/01/2018  OT End of Session - 04/01/18 2128    Visit Number  40    Number of Visits  42    Date for OT Re-Evaluation  04/24/18    Authorization Type  BC/BS    OT Start Time  1332    OT Stop Time  1415    OT Time Calculation (min)  43 min    Activity Tolerance  Patient tolerated treatment well       Past Medical History:  Diagnosis Date  . Aphasia 09/17/2017  . 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  . Cardiac LV ejection fraction 21-30%   . Cardioembolic stroke ([place]) 74/94/4967   Right hemiparesis  . Cardiomyopathy ([place]) 04/2017  . Dysphagia 05/19/2017   Last known diet upgraded to regular diet with thin liquids on 06/27/2017, no records available  . 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 Fall 09/17/2017  . Graves disease   . H/O ischemic left MCA stroke 09/17/2017  . Hemiparesis of right dominant side as late effect of cerebral infarction ([place]) 09/17/2017  . Hyperlipidemia   . Hypertension   . Neuropathy 05/19/2017   s/p stroke  . Plaque psoriasis   . Shoulder subluxation, right, initial encounter 09/17/2017  . Weakness 09/17/2017    Past Surgical History:  Procedure Laterality Date  . NO PAST SURGERIES      There were no vitals filed for this visit.  Subjective Assessment - 04/01/18 2120    Subjective   Yes, I see    Patient is accompained by:  Family member   sister and brother in law   Pertinent History  Lt MCA CVA 05/19/2017, Graves dz    Patient Stated Goals  use my Rt arm    Currently in Pain?  No/denies      Treatment:  Pt seen for aquatic therapy  today - treatment occurred in water 2.5-4 feet deep depending upon activity. Pt entered the pool via steps. Today pt only needed guiding assistance for RLE to protect ankle and vc's for postural alignment as well as to decrease reliance on LUE  (no physical facilitation).  Treatment focused on functional ambulation forward and backward with emphasis on alignment and active engagement of R side especially during stance phase.  Pt with improved ability to carry over from previous sessions as evidenced by needing far less physical facilitation for alignment and postural control. Pt also improving in ability to demonstrate isolated RLE movement, as well as lower trunk initiated movement where appropriate. Addressed isolated active hip extension as well as ab/adduction of RLE with focus on strength as well as core stability and alignment and control with activities.  Performed in deeper water to increase resistance.  Also moved from functional ambulation with LUE on pool wall to walking in open water using only dumb bells for UE support (pt needed assist with RUE for dumb bell).  Open water walking allowed pt to experience LOB backward when she did not align trunk over RLE and transition weight forward over RLE.  Pt able to correct with only vc's - as pt decreases reliance on LUE pt demonstrates continued improvement  in trunk and proximal RUE stability for assistance in dynamic standing balance.  Pt exited the pool via steps with only demonstration and cues/closer supervision to use RLE as power leg ascending steps.                         OT Short Term Goals - 04/01/18 2125      OT SHORT TERM GOAL #1   Title  Pt/family independent with HEP for RUE - 11/27/17    Status  Achieved      OT SHORT TERM GOAL #2   Title  Independent with splint wear and care for Rt hand prn    Status  Achieved      OT SHORT TERM GOAL #3   Title  Pt/family to verbalize understanding with A/E and task modifications  to increase indpendence with ADLS/IADLS (for bathing, tying shoes, cutting food, simple meal prep)    Status  Achieved      OT SHORT TERM GOAL #4   Title  Pt to perform bathing with only min assist and A/E prn while seated     Status  Achieved      OT SHORT TERM GOAL #5   Title  Pt to make sandwich w/ only supervision/cues prn    Status  Achieved      OT SHORT TERM GOAL #6   Title  Pt to demo 25 degrees shoulder flexion in prep for low level reaching with gross finger flexion - goals due 01/21/2018    Time  4    Period  Weeks    Status  Not Met      OT SHORT TERM GOAL #7   Title  Pt to perform environmental scanning at 90% accuracy during ambulation    Time  4    Period  Weeks    Status  Not Met   77%      OT SHORT TERM GOAL #8   Title  Pt to demo initiation of proximal movement of UE, scapula and shoulder girdle stability for trunk stabilization with functional ambulation - 03/24/18    Time  4    Period  Weeks    Status  Achieved        OT Long Term Goals - 04/01/18 2126      OT LONG TERM GOAL #1   Title  Pt to perform shower transfers at mod I level w/ DME prn and no LOB - goals due 02/18/2018    Time  8    Period  Weeks    Status  Not Met   Pt requires supervision to min assist for safety.      OT LONG TERM GOAL #2   Title  Pt to perform simple meal prep at sup level using A/E and task modifications prn    Time  8    Period  Weeks    Status  Achieved      OT LONG TERM GOAL #3   Title  Pt to perform light house management tasks at sup level including: laundry, washing dishes, cleaning, making bed    Time  8    Period  Weeks    Status  Achieved   per pt report     OT LONG TERM GOAL #4   Title  Pt to demo 25% finger extension in prep for releasing objects Rt hand     Time  8    Period  Weeks  Status  Not Met   no active finger extension     OT LONG TERM GOAL #5   Title  (LTG #5 moved to STG for renewal period)      OT LONG TERM GOAL #6   Title  Pt to  use Rt hand as stabalizer 25% of the time    Time  8    Period  Weeks    Status  Not Met      OT LONG TERM GOAL #7   Title  Pt/family to verbalize understanding with neccessary accommodations, A/E, etc that would be needed if pt is to return to living alone    Time  8    Period  Weeks    Status  Deferred   Pt will not be safe to return to living alone at this time     OT LONG TERM GOAL #8   Title  Pt/family independent with home use of NMES for Rt wrist and finger extension - due 02/26/18    Time  1    Period  Weeks    Status  Achieved      OT LONG TERM GOAL  #9   Baseline  Pt independent with water based HEP w/ assist from family prn - due by 04/24/18    Time  8    Period  Weeks    Status  On-going      OT LONG TERM GOAL  #10   TITLE  Pt will improve postural control/alignment for functional mobility to decrease risk of falls during ADLS/IADL activities - due by 04/24/18    Time  8    Period  Weeks    Status  On-going            Plan - 04/01/18 2126    Clinical Impression Statement  Pt continues to progress toward goals.  Pt able to carry over improved postural control today with vc's and only intermiittent facilitation.     Occupational Profile and client history currently impacting functional performance  no significant PMH but current impairments extensive    Occupational performance deficits (Please refer to evaluation for details):  ADL's;IADL's;Work;Leisure;Social Participation    Rehab Potential  Fair    Current Impairments/barriers affecting progress:  severity of deficits    OT Frequency  1x / week    OT Duration  8 weeks    OT Treatment/Interventions  Aquatic Therapy;Self-care/ADL training;DME and/or AE instruction;Therapeutic activities;Neuromuscular education;Patient/family education;Electrical Stimulation    Plan  Aquatic therapy to address remaining goals (STG #8, and LTG's #9 and #10)    Consulted and Agree with Plan of Care  Patient;Family member/caregiver     Family Member Consulted  sister and brother in law       Patient will benefit from skilled therapeutic intervention in order to improve the following deficits and impairments:  Decreased coordination, Decreased range of motion, Difficulty walking, Improper body mechanics, Decreased endurance, Decreased safety awareness, Impaired sensation, Improper spinal/pelvic alignment, Impaired tone, Decreased knowledge of precautions, Decreased knowledge of use of DME, Decreased balance, Impaired UE functional use, Pain, Decreased cognition, Decreased mobility, Decreased strength, Impaired perceived functional ability, Impaired vision/preception  Visit Diagnosis: Unsteadiness on feet  Muscle weakness (generalized)  Hemiplegia and hemiparesis following cerebral infarction affecting right dominant side (HCC)  Other symptoms and signs involving the nervous system  Visuospatial deficit  Other disturbances of skin sensation  Chronic right shoulder pain  Other symptoms and signs involving cognitive functions following cerebral infarction  Problem List Patient Active Problem List   Diagnosis Date Noted  . Difficulty coping with disease 03/31/2018  . Fall 09/17/2017  . Hemiparesis of right dominant side as late effect of cerebral infarction ([place]) 09/17/2017  . Shoulder subluxation, right, initial encounter 09/17/2017  . Cardioembolic stroke ([facility]) 62/08/5595  . Weakness 09/17/2017  . Aphasia 09/17/2017  . 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 Cardiomyopathy ([place]) 09/17/2017  . H/O ischemic left MCA stroke 09/17/2017  . Cardiac LV ejection fraction 21-30%   . Graves disease   . Plaque psoriasis   . Neuropathy 05/19/2017    Quay Burow, OTR/L 04/01/2018, 9:30 PM  [facility] [address] [place], [place], [address] Phone: [phone]   Fax:  [phone]  Name: Sophy Mesler MRN:  [id number] Date of Birth: 1960/07/06

## 2018-04-01 NOTE — Telephone Encounter (Signed)
Patient brother notified, okay per dpr. Verbalized understanding.

## 2018-04-03 ENCOUNTER — Ambulatory Visit: Payer: BLUE CROSS/BLUE SHIELD | Admitting: Physical Therapy

## 2018-04-03 ENCOUNTER — Encounter: Payer: Self-pay | Admitting: Physical Therapy

## 2018-04-03 DIAGNOSIS — R2681 Unsteadiness on feet: Secondary | ICD-10-CM | POA: Diagnosis not present

## 2018-04-03 DIAGNOSIS — I69351 Hemiplegia and hemiparesis following cerebral infarction affecting right dominant side: Secondary | ICD-10-CM

## 2018-04-03 DIAGNOSIS — M6281 Muscle weakness (generalized): Secondary | ICD-10-CM

## 2018-04-03 NOTE — Therapy (Signed)
[facility] [address] [place], [place], [address] Phone: [phone]   Fax:  [phone]  Physical Therapy Treatment  Patient Details  Name: Margaret Ortiz MRN: [id number] Date of Birth: 15-Oct-1960 Referring Provider (PT): Howard Pouch, DO   Encounter Date: 04/03/2018  PT End of Session - 04/03/18 1654    Visit Number  38    Number of Visits  41    Date for PT Re-Evaluation  04/27/18    Authorization Type  BCBS     PT Start Time  1446    PT Stop Time  1528    PT Time Calculation (min)  42 min    Equipment Utilized During Treatment  --   min guard to SUP throughout   Activity Tolerance  Patient tolerated treatment well    Behavior During Therapy  [facility] for tasks assessed/performed       Past Medical History:  Diagnosis Date  . Aphasia 09/17/2017  . 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  . Cardiac LV ejection fraction 21-30%   . Cardioembolic stroke ([place]) 94/85/4627   Right hemiparesis  . Cardiomyopathy ([place]) 04/2017  . Dysphagia 05/19/2017   Last known diet upgraded to regular diet with thin liquids on 06/27/2017, no records available  . 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 Fall 09/17/2017  . Graves disease   . H/O ischemic left MCA stroke 09/17/2017  . Hemiparesis of right dominant side as late effect of cerebral infarction ([place]) 09/17/2017  . Hyperlipidemia   . Hypertension   . Neuropathy 05/19/2017   s/p stroke  . Plaque psoriasis   . Shoulder subluxation, right, initial encounter 09/17/2017  . Weakness 09/17/2017    Past Surgical History:  Procedure Laterality Date  . NO PAST SURGERIES      There were no vitals filed for this visit.  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      Currently in Pain?  No/denies    Pain Score  0-No pain                       [facility] Adult PT Treatment/Exercise - 04/03/18 0001      Ambulation/Gait   Ambulation/Gait  Yes    Ambulation/Gait Assistance  4: Min guard    Ambulation/Gait Assistance Details  verbal cueing to increase R hip/knee felxion to reduce circumduction compensations    Ambulation Distance (Feet)  --   2 laps around PT gym   Assistive device  Straight cane    Gait Pattern  Decreased stance time - right;Decreased step length - left;Decreased hip/knee flexion - right;Decreased dorsiflexion - right;Decreased weight shift to right;Right circumduction;Right hip hike;Lateral hip instability;Poor foot clearance - right    Ambulation Surface  Level;Indoor      Exercises   Exercises  Knee/Hip    Other Exercises   in quadruped rocking from heel sitting to table top x 15; heel sitting to tall kneeling with k-bench x 15; heel sitting to tall kneeling with resisted hip extension with  green tband x 15; tall kneeling side stepping with required assist to reduce slide and facilitate clearance form mat table.       Knee/Hip Exercises: Standing   Step Down  Right;3 sets;Step Height: 2"   ~7 reps per set   Step Down Limitations  focusing on eccentric control - 1 UE support at // bars      Knee/Hip Exercises: Seated   Hamstring Curl  Strengthening;Right;15 reps    Hamstring Limitations  PT resisted with cueing to reduced forward flexion and L hip involvement      Knee/Hip Exercises: Supine   Bridges  Strengthening;Both;2 sets;15 reps    Bridges Limitations  feet elevated on red physioball to increase HS activation               PT Short Term Goals - 02/26/18 0945      PT SHORT TERM GOAL #1   Title   The patient will ambulate on level community surfaces with SPC mod indep for improved mobility.    Baseline  Using [facility] in community for limited surfaces.    Time  4    Period  Weeks    Status  Achieved      PT SHORT TERM GOAL #2   Title  The patient will improve gait speed from 0.85 ft/sec to > or equal to 1.2 ft/sec to demonstrate transition from "household ambulator" to "limited community ambulator" classification of gait.    Baseline  1.17 ft/sec on 01/28/18    Time  4    Period  Weeks    Status  Partially Met      PT SHORT TERM GOAL #3   Title  The patient will improve Berg balance score from 42/56 to > or equal to 45/56 to demo dec'ing risk for falls.    Baseline  Berg=45/56 on 01/28/18    Time  4    Period  Weeks    Status  Achieved      PT SHORT TERM GOAL #4   Title  The patient will ambulate x 600 ft nonstop to demo improved endurance for functional gait.    Baseline  Patient able to ambulate nonstop x 600 ft mod indep on level surfaces with SPc.     Time  4    Period  Weeks    Status  Achieved        PT Long Term Goals - 03/23/18 1323      PT LONG TERM GOAL #1   Title  The patient will perform updated HEP with intermittent assist for post d/c progression of exercise.    Baseline  03/23/18: met as patient is doing prescribed exercises and walking as well as aquatic therapy, may need to be advanced after next 4 week cert.     Status  Achieved      PT LONG TERM GOAL #2   Title  The patient will move floor<>stand with mod indep using L UE support on surface due to h/o falls.    Baseline  03/23/18: performed x3 reps today with min guard assist downgrading to supervision, cues on sequencing with left UE support on mat table. no change since goal checked/set on 02/26/18.    Time  --    Period  --    Status  Not Met      PT LONG TERM GOAL #3   Title  The patient will improve gait speed from 0.85 ft/sec to > or equal to 2.5 ft/sec  to demo dec'ing risk for falls.    Baseline   03/23/18: 1.92 ft/sec with cane/AFO with supervision, less that when was checked on 02/19/18 (1.95 ft/sec)    Time  --    Period  --    Status  Not Met      PT LONG TERM GOAL #4   Title  The patient will improve Berg score from 49/56 (on 02/25/2018) up to 52/56 to demo dec'ing risk for falls.    Baseline  03/23/18: 50/56 scored today. improved from 49/56 on 02/25/18.    Time  --    Period  --    Status  Partially Met      PT LONG TERM GOAL #5   Title  The patient will negotiate 12 steps with a handrail (light support only)with reciprocal pattern mod indep to access home entries.    Baseline  03/23/18: cont's to be Mod I with single rail/step to pattern. min guard to min assist when using reciprocal pattern with cues needed.    Time  --    Period  --    Status  Not Met            Plan - 04/03/18 1655    Clinical Impression Statement  Patient with subjective reports of high activity levels - states she walks 5km + performs UE/LE exercises - discussion with patient that she does need rest/recovery time and encouraged a "day off" this weekend. PT session continues to focus on R LE strenthening in functional manners. At one time patient becoming tearful, but unable to state cause - seemingly due to frustration. Making good progess towards goals.     PT Treatment/Interventions  ADLs/Self Care Home Management;Gait training;Stair training;Functional mobility training;Therapeutic activities;Therapeutic exercise;Balance training;Neuromuscular re-education;Patient/family education;Orthotic Fit/Training;Electrical Stimulation;Manual techniques;DME Instruction    PT Next Visit Plan  primary PT to recert; continue to work on improved right hip protraction (extension) in stance, right knee flexion without hip hike for swing phase of gait, high level balance. continue with tall kneelking for right hip activation, right LE strengthening. check into pt interest in community fitness and current options available  to her    Consulted and Agree with Plan of Care  Patient       Patient will benefit from skilled therapeutic intervention in order to improve the following deficits and impairments:  Abnormal gait, Impaired sensation, Pain, Postural dysfunction, Impaired tone, Decreased mobility, Decreased coordination, Decreased activity tolerance, Decreased endurance, Decreased strength, Difficulty walking, Decreased balance  Visit Diagnosis: Unsteadiness on feet  Muscle weakness (generalized)  Hemiplegia and hemiparesis following cerebral infarction affecting right dominant side [facility])     Problem List Patient Active Problem List   Diagnosis Date Noted  . Difficulty coping with disease 03/31/2018  . Fall 09/17/2017  . Hemiparesis of right dominant side as late effect of cerebral infarction ([place]) 09/17/2017  . Shoulder subluxation, right, initial encounter 09/17/2017  . Cardioembolic stroke ([place]) 25/85/2778  . Weakness 09/17/2017  . Aphasia 09/17/2017  . 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 Cardiomyopathy ([place]) 09/17/2017  . H/O ischemic left MCA stroke 09/17/2017  . Cardiac LV ejection fraction 21-30%   . Graves disease   . Plaque psoriasis   . Neuropathy 05/19/2017     Lanney Gins, PT, DPT Supplemental Physical Therapist 04/03/18 5:02 PM Pager: [phone] Office: [facility] [address] [place], [place], [address] Phone: [phone]   Fax:  [phone]  Name: Margaret Ortiz MRN: [id number] Date of Birth: 1961-02-18

## 2018-04-06 ENCOUNTER — Ambulatory Visit: Payer: BLUE CROSS/BLUE SHIELD | Admitting: Psychology

## 2018-04-07 ENCOUNTER — Encounter: Payer: Self-pay | Admitting: Family Medicine

## 2018-04-08 ENCOUNTER — Encounter: Payer: Self-pay | Admitting: Occupational Therapy

## 2018-04-08 ENCOUNTER — Ambulatory Visit: Payer: BLUE CROSS/BLUE SHIELD | Admitting: Occupational Therapy

## 2018-04-08 DIAGNOSIS — R2681 Unsteadiness on feet: Secondary | ICD-10-CM | POA: Diagnosis not present

## 2018-04-08 DIAGNOSIS — R208 Other disturbances of skin sensation: Secondary | ICD-10-CM

## 2018-04-08 DIAGNOSIS — I69351 Hemiplegia and hemiparesis following cerebral infarction affecting right dominant side: Secondary | ICD-10-CM

## 2018-04-08 DIAGNOSIS — M25511 Pain in right shoulder: Secondary | ICD-10-CM

## 2018-04-08 DIAGNOSIS — R41842 Visuospatial deficit: Secondary | ICD-10-CM

## 2018-04-08 DIAGNOSIS — I69318 Other symptoms and signs involving cognitive functions following cerebral infarction: Secondary | ICD-10-CM

## 2018-04-08 DIAGNOSIS — M6281 Muscle weakness (generalized): Secondary | ICD-10-CM

## 2018-04-08 DIAGNOSIS — G8929 Other chronic pain: Secondary | ICD-10-CM

## 2018-04-08 DIAGNOSIS — R29818 Other symptoms and signs involving the nervous system: Secondary | ICD-10-CM

## 2018-04-08 NOTE — Therapy (Signed)
[facility] [address] [place], [place], [address] Phone: [phone]   Fax:  [phone]  Occupational Therapy Treatment  Patient Details  Name: Margaret Ortiz MRN: [id number] Date of Birth: 07-22-1960 No data recorded  Encounter Date: 04/08/2018  OT End of Session - 04/08/18 2108    Visit Number  41    Number of Visits  42    Date for OT Re-Evaluation  04/24/18    Authorization Type  BC/BS    OT Start Time  1328    OT Stop Time  1417    OT Time Calculation (min)  49 min    Activity Tolerance  Patient tolerated treatment well       Past Medical History:  Diagnosis Date  . Aphasia 09/17/2017  . 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  . Cardiac LV ejection fraction 21-30%   . Cardioembolic stroke ([place]) 73/42/8768   Right hemiparesis  . Cardiomyopathy ([place]) 04/2017  . Dysphagia 05/19/2017   Last known diet upgraded to regular diet with thin liquids on 06/27/2017, no records available  . 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 Fall 09/17/2017  . Graves disease   . H/O ischemic left MCA stroke 09/17/2017  . Hemiparesis of right dominant side as late effect of cerebral infarction ([place]) 09/17/2017  . Hyperlipidemia   . Hypertension   . Neuropathy 05/19/2017   s/p stroke  . Plaque psoriasis   . Shoulder subluxation, right, initial encounter 09/17/2017  . Weakness 09/17/2017    Past Surgical History:  Procedure Laterality Date  . NO PAST SURGERIES      There were no vitals filed for this visit.  Subjective Assessment - 04/08/18 2103    Subjective   Yes I did it    Patient is accompained by:  Family member   sister   Pertinent History  Lt MCA CVA 05/19/2017, Graves dz    Patient Stated Goals  use my Rt arm    Currently in Pain?  No/denies       Patient seen for aquatic therapy today.  Treatment took  place in water 2.5-4 feet deep depending upon activity.  Pt entered the pool via steps using 1 railing and LUE for light support.  Pt able to to descend stairs with vc's only and maintain good postural alignment and control.  Treatment focused on:  Postural alignment and control with functional ambulation forward and backward.  Today pt required only vc's and occasional intermittent light facilitation with backward walking to utilize full hip extension with RLE using light LUE support on pool wall.  Also addressed full activation of RLE, trunk and proximal RUE for weight shifting onto single leg stance on RLE - pt able to complete with only vc's and light min facilitation with BUE's on dumb bellls as floating UE support.  Dumb bells used to facilitate thoracic and hip  extension and to discourage shoulder hiking and abnormal trunk rotation when weight shifting to RLE.  Pt able to tolerate full bilateral horizontal abduction of BUE's simultaneously without pain (PROM for RUE).  Addressed use of aqua stretch to assist in release of R pec muscle.  Progressed pt to focus on dynamic standing balance, postural alignment and control and functional ambulation in free water (away from pool edge) without any UE support - pt needed min facilitation through RUE initially however with repetition and  practice pt able to advance to requiring vc's only.  Also addressed RLE strengthening for hip extension and hip ab/adduction to assist with postural alignment and control with functional transitional movements.  Also addressed R ankle stretch as well as activation of plantar/dorsiflexion off edge of step with water assisting.  Pt exited pool via steps and light LUE support on railing - pt needed only vc's and assist for RLE placement (due to R AFO not being on) to ascend stairs with RLE as power leg.                        OT Short Term Goals - 04/08/18 2105      OT SHORT TERM GOAL #1   Title  Pt/family  independent with HEP for RUE - 11/27/17    Status  Achieved      OT SHORT TERM GOAL #2   Title  Independent with splint wear and care for Rt hand prn    Status  Achieved      OT SHORT TERM GOAL #3   Title  Pt/family to verbalize understanding with A/E and task modifications to increase indpendence with ADLS/IADLS (for bathing, tying shoes, cutting food, simple meal prep)    Status  Achieved      OT SHORT TERM GOAL #4   Title  Pt to perform bathing with only min assist and A/E prn while seated     Status  Achieved      OT SHORT TERM GOAL #5   Title  Pt to make sandwich w/ only supervision/cues prn    Status  Achieved      OT SHORT TERM GOAL #6   Title  Pt to demo 25 degrees shoulder flexion in prep for low level reaching with gross finger flexion - goals due 01/21/2018    Time  4    Period  Weeks    Status  Not Met      OT SHORT TERM GOAL #7   Title  Pt to perform environmental scanning at 90% accuracy during ambulation    Time  4    Period  Weeks    Status  Not Met   77%      OT SHORT TERM GOAL #8   Title  Pt to demo initiation of proximal movement of UE, scapula and shoulder girdle stability for trunk stabilization with functional ambulation - 03/24/18    Time  4    Period  Weeks    Status  Achieved        OT Long Term Goals - 04/08/18 2106      OT LONG TERM GOAL #1   Title  Pt to perform shower transfers at mod I level w/ DME prn and no LOB - goals due 02/18/2018    Time  8    Period  Weeks    Status  Achieved   Pt requires supervision to min assist for safety.      OT LONG TERM GOAL #2   Title  Pt to perform simple meal prep at sup level using A/E and task modifications prn    Time  8    Period  Weeks    Status  Achieved      OT LONG TERM GOAL #3   Title  Pt to perform light house management tasks at sup level including: laundry, washing dishes, cleaning, making bed    Time  8    Period  Weeks  Status  Achieved   per pt report     OT LONG TERM GOAL #4    Title  Pt to demo 25% finger extension in prep for releasing objects Rt hand     Time  8    Period  Weeks    Status  Not Met   no active finger extension     OT LONG TERM GOAL #5   Title  (LTG #5 moved to STG for renewal period)      OT LONG TERM GOAL #6   Title  Pt to use Rt hand as stabalizer 25% of the time    Time  8    Period  Weeks    Status  Not Met      OT LONG TERM GOAL #7   Title  Pt/family to verbalize understanding with neccessary accommodations, A/E, etc that would be needed if pt is to return to living alone    Time  8    Period  Weeks    Status  Deferred   Pt will not be safe to return to living alone at this time     OT LONG TERM GOAL #8   Title  Pt/family independent with home use of NMES for Rt wrist and finger extension - due 02/26/18    Time  1    Period  Weeks    Status  Achieved      OT LONG TERM GOAL  #9   Baseline  Pt independent with water based HEP w/ assist from family prn - due by 04/24/18    Time  8    Period  Weeks    Status  Deferred   pt's sister states they do not have access to pool at this time     OT LONG TERM GOAL  #10   TITLE  Pt will improve postural control/alignment for functional mobility to decrease risk of falls during ADLS/IADL activities - due by 04/24/18    Time  8    Period  Weeks    Status  Achieved            Plan - 04/08/18 2104    Clinical Impression Statement  Pt has met all but 2 LTG OT goals and is ready for discharge.  Pt will continue with PT - see PT notes for futher details    Occupational Profile and client history currently impacting functional performance  no significant PMH but current impairments extensive    Occupational performance deficits (Please refer to evaluation for details):  ADL's;IADL's;Work;Leisure;Social Participation    Rehab Potential  Fair    Current Impairments/barriers affecting progress:  severity of deficits    OT Frequency  1x / week    OT Duration  8 weeks    OT  Treatment/Interventions  Aquatic Therapy;Self-care/ADL training;DME and/or AE instruction;Therapeutic activities;Neuromuscular education;Patient/family education;Electrical Stimulation    Plan  d/c from OT    Consulted and Agree with Plan of Care  Patient;Family member/caregiver    Family Member Consulted  sister       Patient will benefit from skilled therapeutic intervention in order to improve the following deficits and impairments:  Decreased coordination, Decreased range of motion, Difficulty walking, Improper body mechanics, Decreased endurance, Decreased safety awareness, Impaired sensation, Improper spinal/pelvic alignment, Impaired tone, Decreased knowledge of precautions, Decreased knowledge of use of DME, Decreased balance, Impaired UE functional use, Pain, Decreased cognition, Decreased mobility, Decreased strength, Impaired perceived functional ability, Impaired vision/preception  Visit Diagnosis: Unsteadiness  on feet  Muscle weakness (generalized)  Hemiplegia and hemiparesis following cerebral infarction affecting right dominant side (HCC)  Other symptoms and signs involving the nervous system  Visuospatial deficit  Other disturbances of skin sensation  Chronic right shoulder pain  Other symptoms and signs involving cognitive functions following cerebral infarction    Problem List Patient Active Problem List   Diagnosis Date Noted  . Difficulty coping with disease 03/31/2018  . Hemiparesis of right dominant side as late effect of cerebral infarction ([place]) 09/17/2017  . Cardioembolic stroke ([place]) 25/48/3234  . 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 Cardiomyopathy ([place]) 09/17/2017  . H/O ischemic left MCA stroke 09/17/2017  . Cardiac LV ejection fraction 21-30%   . Graves disease   . Plaque psoriasis   . Neuropathy 05/19/2017   OCCUPATIONAL THERAPY DISCHARGE SUMMARY  Visits from Start of Care: 41  Current functional level related to goals  / functional outcomes: See above   Remaining deficits: Spastic R dominant hemiplegia, sensory impairment, apraxia, cognitive deficits, language deficits, balance deficits.  Pt to continue with PT in clinic.   Education / Equipment: HEP  Plan: Patient agrees to discharge.  Patient goals were partially met. Patient is being discharged due to meeting the stated rehab goals.  ?????     Quay Burow, OTR/L 04/08/2018, 9:09 PM  [facility] [address] [place], [place], [address] Phone: [phone]   Fax:  [phone]  Name: Margaret Ortiz MRN: [id number] Date of Birth: Mar 14, 1961

## 2018-04-13 ENCOUNTER — Ambulatory Visit: Payer: BLUE CROSS/BLUE SHIELD | Admitting: Rehabilitation

## 2018-04-13 ENCOUNTER — Encounter: Payer: Self-pay | Admitting: Rehabilitation

## 2018-04-13 DIAGNOSIS — R29818 Other symptoms and signs involving the nervous system: Secondary | ICD-10-CM

## 2018-04-13 DIAGNOSIS — R2681 Unsteadiness on feet: Secondary | ICD-10-CM

## 2018-04-13 DIAGNOSIS — M6281 Muscle weakness (generalized): Secondary | ICD-10-CM

## 2018-04-13 DIAGNOSIS — I69351 Hemiplegia and hemiparesis following cerebral infarction affecting right dominant side: Secondary | ICD-10-CM

## 2018-04-13 NOTE — Therapy (Signed)
[facility] [address] [place], [place], [address] Phone: [phone]   Fax:  [phone]  Physical Therapy Treatment  Patient Details  Name: Margaret Ortiz MRN: [id number] Date of Birth: 1961-04-02 Referring Provider (PT): Howard Pouch, DO   Encounter Date: 04/13/2018  PT End of Session - 04/13/18 1310    Visit Number  39    Number of Visits  41    Date for PT Re-Evaluation  04/27/18    Authorization Type  BCBS     PT Start Time  1148    PT Stop Time  1230    PT Time Calculation (min)  42 min    Equipment Utilized During Treatment  --   min guard to SUP throughout   Activity Tolerance  Patient tolerated treatment well    Behavior During Therapy  [facility] for tasks assessed/performed       Past Medical History:  Diagnosis Date  . Aphasia 09/17/2017  . 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  . Cardiac LV ejection fraction 21-30%   . Cardioembolic stroke ([place]) 16/06/930   Right hemiparesis  . Cardiomyopathy ([place]) 04/2017  . Dysphagia 05/19/2017   Last known diet upgraded to regular diet with thin liquids on 06/27/2017, no records available  . 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 Fall 09/17/2017  . Graves disease   . H/O ischemic left MCA stroke 09/17/2017  . Hemiparesis of right dominant side as late effect of cerebral infarction ([place]) 09/17/2017  . Hyperlipidemia   . Hypertension   . Neuropathy 05/19/2017   s/p stroke  . Plaque psoriasis   . Shoulder subluxation, right, initial encounter 09/17/2017  . Weakness 09/17/2017    Past Surgical History:  Procedure Laterality Date  . NO PAST SURGERIES      There were no vitals filed for this visit.  Subjective Assessment - 04/13/18 1243    Subjective  Pt reports she is walking 6 miles per day, taking 3 rest breaks along the way.  Also still questioning  when she will  be able to get rid of brace.     Patient is accompained by:  Family member    Pertinent History  cardiomyopathy, graves disease, shoulder subluxation s/p CVA    Patient Stated Goals  To get better to live independently and go back to research at [employer]      Currently in Pain?  No/denies                       [facility] Adult PT Treatment/Exercise - 04/13/18 1150      Transfers   Floor to Transfer  5: Supervision;4: Min guard    Floor to Transfer Details (indicate cue type and reason)  Pt performed several times during session to perform RLE NMR but also to work on powering up into stand through RLE.  She requires S to min/guard A (light assist for RLE) with min cues.  She requires increased time initially to work through getting into tall kneeling safely.  Upon going back to stand/seated position, she is able to do with LLE at mod I level, however had her perform x 3 reps from R half kneel to work on improving strength/forced use of RLE.  She is able to do at S level however requires cues to utilize LUE to help with placement of RLE.  Floor to Transfer Details  Verbal cues for sequencing;Verbal cues for technique;Verbal cues for precautions/safety;Tactile cues for placement      Ambulation/Gait   Ambulation/Gait  Yes    Ambulation/Gait Assistance  5: Supervision    Ambulation/Gait Assistance Details  Had pt ambulate x 230' with SPC and R AFO during session.  Min cues for lighter support through [place] which allowed her to be more relaxed and clear RLE with decreased circumduction/compensation.      Ambulation Distance (Feet)  230 Feet    Assistive device  Straight cane    Gait Pattern  Decreased stance time - right;Decreased step length - left;Decreased hip/knee flexion - right;Decreased dorsiflexion - right;Decreased weight shift to right;Right circumduction;Right hip hike;Lateral hip instability;Poor foot clearance - right    Ambulation  Surface  Level;Indoor      Self-Care   Self-Care  Other Self-Care Comments    Other Self-Care Comments   Pt continues to ask about being able to ambulate without AFO.  Did note that she does have some isolated R ankle PF during session, however she has no DF activation therefore educated on how she would need more stability at ankle and improved DF activation to walk safely without brace.  Educated that brace would not hinder any return that she may get.  Also educated on importance of stretching R heel cord and how this can impact gait as well.  Both pt and brother in law verbalized understanding.  Provided pt with handout (and emailed per pt request) information on [facility] for continued fitness following PT next week.  Also provided this information to son in law.        Therapeutic Activites    Therapeutic Activities  Other Therapeutic Activities    Other Therapeutic Activities  Had pt perform seated R ankle PF.  Note that there is some isolated active movement (2-/5).  She is also able to elicit activation in B heel raises therefore encouraged her to do this at home as well.        Neuro Re-ed    Neuro Re-ed Details   NMR for RLE in tall kneel/half kneeling positions.  Holding L half kneeling with intermittent LUE support.  Pt continues to keep increased forward weight shift onto LLE but with cues is able to increase RLE weight bearing and posterior weight shift.  Progressed to transitions between tall kneeling to L half kneeling first with LUE support>a few fingers>holding 5lb weight in LUE with light min/guard and tactile facilitation at R pelvis to increase stabilization.  Pt able to perform without support, but needs heavy cues.  PT did add to HEP but recoommend she do beside couch.  Also note that she may need some light assist for placement of RLE.  Pts brother in law present during session and provided education for him to do so.        Exercises   Exercises  Other Exercises     Other Exercises   Standing R calf stretch with cues for technique and safety.  Added to HEP as R ankle remains very tight.               PT Education - 04/13/18 1309    Education provided  Yes    Education Details  see self care    Person(s) Educated  Patient;Caregiver(s)    Methods  Explanation;Demonstration;Handout    Comprehension  Verbalized understanding;Returned demonstration       PT Short Term Goals -  02/26/18 0945      PT SHORT TERM GOAL #1   Title  The patient will ambulate on level community surfaces with SPC mod indep for improved mobility.    Baseline  Using [facility] in community for limited surfaces.    Time  4    Period  Weeks    Status  Achieved      PT SHORT TERM GOAL #2   Title  The patient will improve gait speed from 0.85 ft/sec to > or equal to 1.2 ft/sec to demonstrate transition from "household ambulator" to "limited community ambulator" classification of gait.    Baseline  1.17 ft/sec on 01/28/18    Time  4    Period  Weeks    Status  Partially Met      PT SHORT TERM GOAL #3   Title  The patient will improve Berg balance score from 42/56 to > or equal to 45/56 to demo dec'ing risk for falls.    Baseline  Berg=45/56 on 01/28/18    Time  4    Period  Weeks    Status  Achieved      PT SHORT TERM GOAL #4   Title  The patient will ambulate x 600 ft nonstop to demo improved endurance for functional gait.    Baseline  Patient able to ambulate nonstop x 600 ft mod indep on level surfaces with SPc.     Time  4    Period  Weeks    Status  Achieved        PT Long Term Goals - 04/13/18 1501      PT LONG TERM GOAL #1   Title  The patient will perform updated HEP with intermittent assist for post d/c progression of exercise. (UPDATED LTG DATE for REMAINING UNMET LTGS: 04/27/18)    Baseline  03/23/18: met as patient is doing prescribed exercises and walking as well as aquatic therapy, may need to be advanced after next 4 week cert.     Status  Achieved      PT  LONG TERM GOAL #2   Title  The patient will move floor<>stand with mod indep using L UE support on surface due to h/o falls.    Baseline  03/23/18: performed x3 reps today with min guard assist downgrading to supervision, cues on sequencing with left UE support on mat table. no change since goal checked/set on 02/26/18.    Status  On-going      PT LONG TERM GOAL #3   Title  The patient will improve gait speed from 1.92 ft/sec to > or equal to 2.5 ft/sec to demo dec'ing risk for falls.    Baseline  03/23/18: 1.92 ft/sec with cane/AFO with supervision, less that when was checked on 02/19/18 (1.95 ft/sec)    Status  Revised      PT LONG TERM GOAL #4   Title  The patient will improve Berg score from 49/56 (on 02/25/2018) up to 52/56 to demo dec'ing risk for falls.    Baseline  03/23/18: 50/56 scored today. improved from 49/56 on 02/25/18.    Status  On-going      PT LONG TERM GOAL #5   Title  The patient will negotiate 12 steps with a handrail (light support only)with reciprocal pattern mod indep to access home entries.    Baseline  03/23/18: cont's to be Mod I with single rail/step to pattern. min guard to min assist when using reciprocal pattern with cues needed.  Status  On-going            Plan - 04/13/18 1312    Clinical Impression Statement  Pt with new active R ankle plantar flexion (2-/5) that she is able to elicit in standing as well.  She continues to ask about removal of AFO during gait, however she does not have any active R ankle DF, therefore continue to offer education on this.  Provided information on [facility] and began to issue more up to date HEP in preparation for DC next week.     PT Frequency  2x / week    PT Duration  4 weeks    PT Treatment/Interventions  ADLs/Self Care Home Management;Gait training;Stair training;Functional mobility training;Therapeutic activities;Therapeutic exercise;Balance training;Neuromuscular re-education;Patient/family education;Orthotic  Fit/Training;Electrical Stimulation;Manual techniques;DME Instruction    PT Next Visit Plan  Christina-I restarted an HEP on medbridge, however she does have another HEP from [facility].  I am hoping to condense and give updated HEP.  continue to work on improved right hip protraction (extension) in stance, right knee flexion without hip hike for swing phase of gait, high level balance. continue with tall kneelking for right hip activation, right LE strengthening. check into pt interest in community fitness and current options available to her    Consulted and Agree with Plan of Care  Patient       Patient will benefit from skilled therapeutic intervention in order to improve the following deficits and impairments:  Abnormal gait, Impaired sensation, Pain, Postural dysfunction, Impaired tone, Decreased mobility, Decreased coordination, Decreased activity tolerance, Decreased endurance, Decreased strength, Difficulty walking, Decreased balance  Visit Diagnosis: Unsteadiness on feet  Muscle weakness (generalized)  Hemiplegia and hemiparesis following cerebral infarction affecting right dominant side (HCC)  Other symptoms and signs involving the nervous system     Problem List Patient Active Problem List   Diagnosis Date Noted  . Difficulty coping with disease 03/31/2018  . Hemiparesis of right dominant side as late effect of cerebral infarction ([place]) 09/17/2017  . Cardioembolic stroke ([place]) 29/56/2130  . 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 Cardiomyopathy ([place]) 09/17/2017  . H/O ischemic left MCA stroke 09/17/2017  . Cardiac LV ejection fraction 21-30%   . Graves disease   . Plaque psoriasis   . Neuropathy 05/19/2017   Cameron Sprang, PT, MPT [facility] [address] [place], [place], [address] Phone: [phone]   Fax:  [phone] 04/13/18, 3:05 PM  Name: Margaret Ortiz MRN: [id number] Date of Birth: [birth year]

## 2018-04-16 ENCOUNTER — Encounter: Payer: Self-pay | Admitting: Rehabilitative and Restorative Service Providers"

## 2018-04-16 ENCOUNTER — Ambulatory Visit: Payer: BLUE CROSS/BLUE SHIELD | Admitting: Rehabilitative and Restorative Service Providers"

## 2018-04-16 DIAGNOSIS — R2681 Unsteadiness on feet: Secondary | ICD-10-CM

## 2018-04-16 DIAGNOSIS — M6281 Muscle weakness (generalized): Secondary | ICD-10-CM

## 2018-04-16 DIAGNOSIS — R2689 Other abnormalities of gait and mobility: Secondary | ICD-10-CM

## 2018-04-16 DIAGNOSIS — R29818 Other symptoms and signs involving the nervous system: Secondary | ICD-10-CM

## 2018-04-16 NOTE — Patient Instructions (Addendum)
Access Code: [email or web address]  URL: [email or web address]  Date: 04/16/2018  Prepared by: Rudell Cobb   Exercises Heel rises with counter support - 10 reps - 1 sets - 2x daily - 7x weekly Standing Gastroc Stretch - 3 reps - 1 sets - 30 seconds hold - 2x daily - 7x weekly Wall Squat - 10 reps - 1 sets - 2x daily - 7x weekly Bridge with Hip Abduction and Resistance - 10 reps - 1 sets - 2x daily - 7x weekly  E-mailed [email or web address]

## 2018-04-16 NOTE — Therapy (Signed)
[facility] [address] [place], [place], [address] Phone: [phone]   Fax:  [phone]  Physical Therapy Treatment  Patient Details  Name: Margaret Ortiz MRN: [id number] Date of Birth: 1961/06/14 Referring Provider (PT): Howard Pouch, DO   Encounter Date: 04/16/2018  PT End of Session - 04/16/18 2113    Visit Number  40    Number of Visits  41    Date for PT Re-Evaluation  04/27/18    Authorization Type  BCBS     PT Start Time  1320    PT Stop Time  1410    PT Time Calculation (min)  50 min    Equipment Utilized During Treatment  --   min guard to SUP throughout   Activity Tolerance  Patient tolerated treatment well    Behavior During Therapy  [facility] for tasks assessed/performed       Past Medical History:  Diagnosis Date  . Aphasia 09/17/2017  . 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  . Cardiac LV ejection fraction 21-30%   . Cardioembolic stroke ([place]) 15/17/6160   Right hemiparesis  . Cardiomyopathy ([place]) 04/2017  . Dysphagia 05/19/2017   Last known diet upgraded to regular diet with thin liquids on 06/27/2017, no records available  . 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 Fall 09/17/2017  . Graves disease   . H/O ischemic left MCA stroke 09/17/2017  . Hemiparesis of right dominant side as late effect of cerebral infarction ([place]) 09/17/2017  . Hyperlipidemia   . Hypertension   . Neuropathy 05/19/2017   s/p stroke  . Plaque psoriasis   . Shoulder subluxation, right, initial encounter 09/17/2017  . Weakness 09/17/2017    Past Surgical History:  Procedure Laterality Date  . NO PAST SURGERIES      There were no vitals filed for this visit.  Subjective Assessment - 04/16/18 1322    Subjective  The patient is no longer using SPC in the home.  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      Currently in Pain?  No/denies                       [facility] Adult PT Treatment/Exercise - 04/16/18 2114      Ambulation/Gait   Ambulation/Gait  Yes    Ambulation/Gait Assistance  6: Modified independent (Device/Increase time);5: Supervision    Ambulation/Gait Assistance Details  The patient ambulated without device with close supervision with R AFO, we then compared and discussed quality of gait using cane is better and encourages more normal gait pattern    Ambulation Distance (Feet)  230 Feet    Assistive device  Straight cane;None    Ambulation Surface  Level;Indoor      Self-Care   Self-Care  Other Self-Care Comments    Other Self-Care Comments   Discussed upcoming d/c next week and need to continue HEP and community program.      Exercises   Exercises  Other Exercises    Other Exercises   Reviewed R heel cord stretch for HEP standing with UE support through chair.  Performed standing heel raises with tactile cues R gastrocs with UE support x 10 reps.  Attempted standing hip abduction,  however patient hip hikes R side as compensatory movement. Sit>1/2 stands x 5 reps with isometric holds.  Wall slides x 10 reps with visual cues from mirror to keep alignment of R knee over R foot.  Attempted backwards walking for gluteal engagement, but patient has compensatory hip hiking/circumduction.  Sidelying clam shells attempted, however patient hikes hips, bridges x 5 reps, bridges with hip ab/adduction.               PT Education - 04/16/18 2113    Education provided  Yes    Education Details  HEP: heel cord stretch, ankle PF standing, bridges with hip ab/adduction, wall squats    Person(s) Educated  Patient    Methods  Explanation;Demonstration;Handout    Comprehension  Returned demonstration;Verbalized understanding       PT Short Term Goals - 02/26/18 0945       PT SHORT TERM GOAL #1   Title  The patient will ambulate on level community surfaces with SPC mod indep for improved mobility.    Baseline  Using [facility] in community for limited surfaces.    Time  4    Period  Weeks    Status  Achieved      PT SHORT TERM GOAL #2   Title  The patient will improve gait speed from 0.85 ft/sec to > or equal to 1.2 ft/sec to demonstrate transition from "household ambulator" to "limited community ambulator" classification of gait.    Baseline  1.17 ft/sec on 01/28/18    Time  4    Period  Weeks    Status  Partially Met      PT SHORT TERM GOAL #3   Title  The patient will improve Berg balance score from 42/56 to > or equal to 45/56 to demo dec'ing risk for falls.    Baseline  Berg=45/56 on 01/28/18    Time  4    Period  Weeks    Status  Achieved      PT SHORT TERM GOAL #4   Title  The patient will ambulate x 600 ft nonstop to demo improved endurance for functional gait.    Baseline  Patient able to ambulate nonstop x 600 ft mod indep on level surfaces with SPc.     Time  4    Period  Weeks    Status  Achieved        PT Long Term Goals - 04/13/18 1501      PT LONG TERM GOAL #1   Title  The patient will perform updated HEP with intermittent assist for post d/c progression of exercise. (UPDATED LTG DATE for REMAINING UNMET LTGS: 04/27/18)    Baseline  03/23/18: met as patient is doing prescribed exercises and walking as well as aquatic therapy, may need to be advanced after next 4 week cert.     Status  Achieved      PT LONG TERM GOAL #2   Title  The patient will move floor<>stand with mod indep using L UE support on surface due to h/o falls.    Baseline  03/23/18: performed x3 reps today with min guard assist downgrading to supervision, cues on sequencing with left UE support on mat table. no change since goal checked/set on 02/26/18.    Status  On-going      PT LONG TERM GOAL #3   Title  The patient will improve gait speed from 1.92 ft/sec to > or equal to  2.5 ft/sec to demo dec'ing  risk for falls.    Baseline  03/23/18: 1.92 ft/sec with cane/AFO with supervision, less that when was checked on 02/19/18 (1.95 ft/sec)    Status  Revised      PT LONG TERM GOAL #4   Title  The patient will improve Berg score from 49/56 (on 02/25/2018) up to 52/56 to demo dec'ing risk for falls.    Baseline  03/23/18: 50/56 scored today. improved from 49/56 on 02/25/18.    Status  On-going      PT LONG TERM GOAL #5   Title  The patient will negotiate 12 steps with a handrail (light support only)with reciprocal pattern mod indep to access home entries.    Baseline  03/23/18: cont's to be Mod I with single rail/step to pattern. min guard to min assist when using reciprocal pattern with cues needed.    Status  On-going            Plan - 04/16/18 2129    Clinical Impression Statement  The patient and PT worked to continue to establish HEP for post d/c plan.  We discussed continued use of the [facility] as her gait quality is significantly better when using device.  Continue to plan for discharge next week.    PT Treatment/Interventions  ADLs/Self Care Home Management;Gait training;Stair training;Functional mobility training;Therapeutic activities;Therapeutic exercise;Balance training;Neuromuscular re-education;Patient/family education;Orthotic Fit/Training;Electrical Stimulation;Manual techniques;DME Instruction    PT Next Visit Plan  continue to condense HEP in medbridge.  d/c planning, home program    Consulted and Agree with Plan of Care  Patient       Patient will benefit from skilled therapeutic intervention in order to improve the following deficits and impairments:  Abnormal gait, Impaired sensation, Pain, Postural dysfunction, Impaired tone, Decreased mobility, Decreased coordination, Decreased activity tolerance, Decreased endurance, Decreased strength, Difficulty walking, Decreased balance  Visit Diagnosis: Unsteadiness on feet  Muscle weakness  (generalized)  Other symptoms and signs involving the nervous system  Other abnormalities of gait and mobility     Problem List Patient Active Problem List   Diagnosis Date Noted  . Difficulty coping with disease 03/31/2018  . Hemiparesis of right dominant side as late effect of cerebral infarction ([place]) 09/17/2017  . Cardioembolic stroke ([place]) 40/34/7425  . 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 Cardiomyopathy ([place]) 09/17/2017  . H/O ischemic left MCA stroke 09/17/2017  . Cardiac LV ejection fraction 21-30%   . Graves disease   . Plaque psoriasis   . Neuropathy 05/19/2017    Mylo Driskill, PT 04/16/2018, 9:34 PM  [facility] [address] [place], [place], [address] Phone: [phone]   Fax:  [phone]  Name: Margaret Ortiz MRN: [id number] Date of Birth: 10/01/1960

## 2018-04-17 ENCOUNTER — Telehealth: Payer: Self-pay

## 2018-04-17 MED ORDER — METOPROLOL SUCCINATE ER 25 MG PO TB24: 25.0000 mg | ORAL_TABLET | Freq: Every day | ORAL | 3 refills | Status: DC

## 2018-04-17 NOTE — Telephone Encounter (Signed)
Patient is calling and states that she has been feeling nauseous and tired for the past month. Patient believes this is related to the metoprolol. Patient takes 12.5 mg BID. She states that her BP has been 108/70 with HRs 60-70. Patient denies any additional Sx. Patient states that she only wants to take it at night. Patient's LVEF was 15-20% prior to starting metoprolol and is now 25-30% by most recent echo. Will forward to Dr. Irish Lack for review.

## 2018-04-17 NOTE — Telephone Encounter (Signed)
Discussed with Dr. Irish Lack and patient will take Toprol-XL 25 mg every night. Called and made patient aware. Rx sent to preferred pharmacy. Patient verbalized understanding and thanked me for the call.

## 2018-04-17 NOTE — Telephone Encounter (Signed)
WIll work better for her heart if she takes it twice a day, but if she perceives side effects, then [facility] to take only at night time.

## 2018-04-20 ENCOUNTER — Encounter (INDEPENDENT_AMBULATORY_CARE_PROVIDER_SITE_OTHER): Payer: Self-pay

## 2018-04-20 ENCOUNTER — Telehealth: Payer: Self-pay | Admitting: Interventional Cardiology

## 2018-04-20 ENCOUNTER — Ambulatory Visit: Payer: BLUE CROSS/BLUE SHIELD | Admitting: Rehabilitative and Restorative Service Providers"

## 2018-04-20 DIAGNOSIS — R2681 Unsteadiness on feet: Secondary | ICD-10-CM | POA: Diagnosis not present

## 2018-04-20 DIAGNOSIS — M6281 Muscle weakness (generalized): Secondary | ICD-10-CM

## 2018-04-20 DIAGNOSIS — R29818 Other symptoms and signs involving the nervous system: Secondary | ICD-10-CM

## 2018-04-20 DIAGNOSIS — R2689 Other abnormalities of gait and mobility: Secondary | ICD-10-CM

## 2018-04-20 NOTE — Telephone Encounter (Signed)
Returned call to patient. She states that she cannot take the metoprolol. She states that it was terrible. She states that her BP was 106/70 HR 70. Patient was unable to verbalize why it was terrible. She states that she does not want to take it. Patient states that she wants to call back later to discuss further.

## 2018-04-20 NOTE — Telephone Encounter (Signed)
° ° °  Please return call to patient °

## 2018-04-20 NOTE — Telephone Encounter (Signed)
New Message        Patient called today to let Dr Irish Lack know that she stop taking the  Metoprolol. Not sure why she stopped. It was hard understanding her. Pls call and advise.

## 2018-04-20 NOTE — Telephone Encounter (Signed)
Follow up  Pt c/o medication issue:  1. Name of Medication: metoprolol succinate (TOPROL-XL) 25 MG 24 hr tablet  2. How are you currently taking this medication (dosage and times per day)? Take 1 tablet (25 mg total) by mouth at bedtime. Take with or immediately following a meal.  3. Are you having a reaction (difficulty breathing--STAT)? yes  4. What is your medication issue? Pt calling again to speak to a nurse regarding the metoprolol making her nauseated and slows her down. Please call

## 2018-04-20 NOTE — Telephone Encounter (Signed)
Returned call to patient who states that the metoprolol causes her to be too tired and she does not want to take it. She states that she wants to be able to exercise. Patient wanting to know if there is another medicine that she can take instead of metoprolol.

## 2018-04-21 ENCOUNTER — Encounter: Payer: Self-pay | Admitting: Rehabilitative and Restorative Service Providers"

## 2018-04-21 NOTE — Therapy (Signed)
[facility] [address] [place], [place], [address] Phone: [phone]   Fax:  [phone]  Physical Therapy Treatment  Patient Details  Name: Margaret Ortiz MRN: [id number] Date of Birth: 10/09/1960 Referring Provider (PT): Howard Pouch, DO   Encounter Date: 04/20/2018  PT End of Session - 04/21/18 0833    Visit Number  41    Number of Visits  41    Date for PT Re-Evaluation  04/27/18    Authorization Type  BCBS     PT Start Time  1105    PT Stop Time  1150    PT Time Calculation (min)  45 min    Equipment Utilized During Treatment  --   min guard to SUP throughout   Activity Tolerance  Patient tolerated treatment well    Behavior During Therapy  [facility] for tasks assessed/performed       Past Medical History:  Diagnosis Date  . Aphasia 09/17/2017  . 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  . Cardiac LV ejection fraction 21-30%   . Cardioembolic stroke ([place]) 62/83/1517   Right hemiparesis  . Cardiomyopathy ([place]) 04/2017  . Dysphagia 05/19/2017   Last known diet upgraded to regular diet with thin liquids on 06/27/2017, no records available  . 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 Fall 09/17/2017  . Graves disease   . H/O ischemic left MCA stroke 09/17/2017  . Hemiparesis of right dominant side as late effect of cerebral infarction ([place]) 09/17/2017  . Hyperlipidemia   . Hypertension   . Neuropathy 05/19/2017   s/p stroke  . Plaque psoriasis   . Shoulder subluxation, right, initial encounter 09/17/2017  . Weakness 09/17/2017    Past Surgical History:  Procedure Laterality Date  . NO PAST SURGERIES      There were no vitals filed for this visit.  Subjective Assessment - 04/20/18 1115    Subjective  Patient doing HEP.  The patient inquires about walking without brace, progress once therapy ends, and  when to return to PT.  Her brother in law inquires about stairs.    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      Currently in Pain?  No/denies         [facility] PT Assessment - 04/20/18 1127      Ambulation/Gait   Stairs Assistance Details (indicate cue type and reason)  Performed with L handrail and step to pattern mod indep,   Required close supervision with on ehandrail and reciprocal pattern and when negotiating without handrail using SPC with step to pattern.                   [facility] Adult PT Treatment/Exercise - 04/20/18 1127      Ambulation/Gait   Ambulation/Gait  Yes    Ambulation/Gait Assistance  6: Modified independent (Device/Increase time)    Ambulation/Gait Assistance Details  PT slowed patient's gait down to encourage staying on R side during terminal stance 1 seconds longer to move over the toe and encourage knee flexion.  Walked outdoors negotiatiing curbs iwth supervision with SPC and inclines/declines on paved surfaces.    Ambulation Distance (Feet)  400 Feet    Assistive device  Straight cane    Ambulation Surface  Level;Indoor;Paved;Outdoor  Gait velocity  2.0 ft/sec    Stairs  Yes    Stairs Assistance  5: Supervision      Neuro Re-ed    Neuro Re-ed Details   Standing with weight shift to the right side without AFO donned and tactile cues to min A working on moving feet into/out of stride position, Moving L LE into march with R knee tactile cues.              PT Education - 04/21/18 7494    Education Details  Discussed rationale for continued use of the AFO, discussed continued use of SPC, HEP after d/c, rationale for returning to therapy.    Person(s) Educated  Patient;Caregiver(s)    Methods  Explanation    Comprehension  Verbalized understanding       PT Short Term Goals - 02/26/18 0945      PT SHORT TERM GOAL #1   Title   The patient will ambulate on level community surfaces with SPC mod indep for improved mobility.    Baseline  Using [facility] in community for limited surfaces.    Time  4    Period  Weeks    Status  Achieved      PT SHORT TERM GOAL #2   Title  The patient will improve gait speed from 0.85 ft/sec to > or equal to 1.2 ft/sec to demonstrate transition from "household ambulator" to "limited community ambulator" classification of gait.    Baseline  1.17 ft/sec on 01/28/18    Time  4    Period  Weeks    Status  Partially Met      PT SHORT TERM GOAL #3   Title  The patient will improve Berg balance score from 42/56 to > or equal to 45/56 to demo dec'ing risk for falls.    Baseline  Berg=45/56 on 01/28/18    Time  4    Period  Weeks    Status  Achieved      PT SHORT TERM GOAL #4   Title  The patient will ambulate x 600 ft nonstop to demo improved endurance for functional gait.    Baseline  Patient able to ambulate nonstop x 600 ft mod indep on level surfaces with SPc.     Time  4    Period  Weeks    Status  Achieved        PT Long Term Goals - 04/20/18 1126      PT LONG TERM GOAL #1   Title  The patient will perform updated HEP with intermittent assist for post d/c progression of exercise. (UPDATED LTG DATE for REMAINING UNMET LTGS: 04/27/18)    Baseline  03/23/18: met as patient is doing prescribed exercises and walking as well as aquatic therapy, may need to be advanced after next 4 week cert.     Status  Achieved      PT LONG TERM GOAL #2   Title  The patient will move floor<>stand with mod indep using L UE support on surface due to h/o falls.    Baseline  03/23/18: performed x3 reps today with min guard assist downgrading to supervision, cues on sequencing with left UE support on mat table. no change since goal checked/set on 02/26/18.    Status  On-going      PT LONG TERM GOAL #3   Title  The patient will improve gait speed from 1.92 ft/sec to > or equal to 2.5 ft/sec to demo dec'ing  risk  for falls.    Baseline  03/23/18: 1.92 ft/sec with cane/AFO with supervision, less that when was checked on 02/19/18 (1.95 ft/sec)    Status  Revised      PT LONG TERM GOAL #4   Title  The patient will improve Berg score from 49/56 (on 02/25/2018) up to 52/56 to demo dec'ing risk for falls.    Baseline  03/23/18: 50/56 scored today. improved from 49/56 on 02/25/18.    Status  On-going      PT LONG TERM GOAL #5   Title  The patient will negotiate 12 steps with a handrail (light support only)with reciprocal pattern mod indep to access home entries.    Baseline  On 10/28, patient is Mod I with single rail/step to pattern. supervision for reciprocal pattern.    Status  Partially Met            Plan - 04/21/18 0836    Clinical Impression Statement  The patient has met 2 LTGs.  PT is continuing to progress to anticipated d/c at next visit.    PT Treatment/Interventions  ADLs/Self Care Home Management;Gait training;Stair training;Functional mobility training;Therapeutic activities;Therapeutic exercise;Balance training;Neuromuscular re-education;Patient/family education;Orthotic Fit/Training;Electrical Stimulation;Manual techniques;DME Instruction    PT Next Visit Plan  continue to condense HEP in medbridge.  d/c planning, home program    Consulted and Agree with Plan of Care  Patient;Family member/caregiver    Family Member Consulted  brother in law       Patient will benefit from skilled therapeutic intervention in order to improve the following deficits and impairments:  Abnormal gait, Impaired sensation, Pain, Postural dysfunction, Impaired tone, Decreased mobility, Decreased coordination, Decreased activity tolerance, Decreased endurance, Decreased strength, Difficulty walking, Decreased balance  Visit Diagnosis: Unsteadiness on feet  Muscle weakness (generalized)  Other symptoms and signs involving the nervous system  Other abnormalities of gait and mobility     Problem  List Patient Active Problem List   Diagnosis Date Noted  . Difficulty coping with disease 03/31/2018  . Hemiparesis of right dominant side as late effect of cerebral infarction ([place]) 09/17/2017  . Cardioembolic stroke ([place]) 11/57/2620  . 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 Cardiomyopathy ([place]) 09/17/2017  . H/O ischemic left MCA stroke 09/17/2017  . Cardiac LV ejection fraction 21-30%   . Graves disease   . Plaque psoriasis   . Neuropathy 05/19/2017    Leauna Sharber , PT 04/21/2018, 8:41 AM  [facility] [address] [place], [place], [address] Phone: [phone]   Fax:  [phone]  Name: Margaret Ortiz MRN: [id number] Date of Birth: [birth year]

## 2018-04-22 NOTE — Telephone Encounter (Signed)
Follow up    Patient is calling back in reference to metoprolol and how it makes her feel. Please call.    Pt c/o medication issue:  1. Name of Medication: metoprolol succinate (TOPROL-XL) 25 MG 24 hr tablet  2. How are you currently taking this medication (dosage and times per day)? Take 1 tablet (25 mg total) by mouth at bedtime. Take with or immediately following a meal.  3. Are you having a reaction (difficulty breathing--STAT)? yes  4. What is your medication issue? Pt calling again to speak to a nurse regarding the metoprolol making her nauseated and slows her down. Please call

## 2018-04-23 ENCOUNTER — Ambulatory Visit: Payer: BLUE CROSS/BLUE SHIELD | Admitting: Rehabilitative and Restorative Service Providers"

## 2018-04-23 DIAGNOSIS — R2681 Unsteadiness on feet: Secondary | ICD-10-CM | POA: Diagnosis not present

## 2018-04-23 DIAGNOSIS — M6281 Muscle weakness (generalized): Secondary | ICD-10-CM

## 2018-04-23 DIAGNOSIS — R29818 Other symptoms and signs involving the nervous system: Secondary | ICD-10-CM

## 2018-04-23 DIAGNOSIS — R2689 Other abnormalities of gait and mobility: Secondary | ICD-10-CM

## 2018-04-23 MED ORDER — CARVEDILOL 3.125 MG PO TABS: 3.1250 mg | ORAL_TABLET | Freq: Two times a day (BID) | ORAL | 3 refills | Status: DC

## 2018-04-23 NOTE — Telephone Encounter (Signed)
Discussed with Dr. Irish Lack, and patient to try carvedilol 3.125 mg BID.  Called and made the patient aware of recommendations. She verbalized understanding and thanked me for the call. Rx sent to preferred pharmacy.

## 2018-04-24 ENCOUNTER — Encounter: Payer: Self-pay | Admitting: Rehabilitative and Restorative Service Providers"

## 2018-04-24 NOTE — Therapy (Signed)
[facility] [address] [place], [place], [address] Phone: [phone]   Fax:  [phone]  Physical Therapy Treatment and Discharge Summary  Patient Details  Name: Margaret Ortiz MRN: [id number] Date of Birth: 11/05/1960 Referring Provider (PT): Howard Pouch, DO   Encounter Date: 04/23/2018  PT End of Session - 04/24/18 0857    Visit Number  42    Date for PT Re-Evaluation  04/27/18    Authorization Type  [employer]     PT Start Time  1320    PT Stop Time  1405    PT Time Calculation (min)  45 min    Equipment Utilized During Treatment  --   min guard to SUP throughout   Activity Tolerance  Patient tolerated treatment well    Behavior During Therapy  [facility] for tasks assessed/performed       Past Medical History:  Diagnosis Date  . Aphasia 09/17/2017  . 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  . Cardiac LV ejection fraction 21-30%   . Cardioembolic stroke ([place]) 38/46/6599   Right hemiparesis  . Cardiomyopathy ([place]) 04/2017  . Dysphagia 05/19/2017   Last known diet upgraded to regular diet with thin liquids on 06/27/2017, no records available  . 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 Fall 09/17/2017  . Graves disease   . H/O ischemic left MCA stroke 09/17/2017  . Hemiparesis of right dominant side as late effect of cerebral infarction ([place]) 09/17/2017  . Hyperlipidemia   . Hypertension   . Neuropathy 05/19/2017   s/p stroke  . Plaque psoriasis   . Shoulder subluxation, right, initial encounter 09/17/2017  . Weakness 09/17/2017    Past Surgical History:  Procedure Laterality Date  . NO PAST SURGERIES      There were no vitals filed for this visit.  Subjective Assessment - 04/24/18 0845    Subjective  The patient is participating with HEP.  She inquires about driving and home bioness unit (addressed in self  care).    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      Currently in Pain?  No/denies         [facility] PT Assessment - 04/24/18 0001      Standardized Balance Assessment   Standardized Balance Assessment  Berg Balance Test      Berg Balance Test   Sit to Stand  Able to stand without using hands and stabilize independently    Standing Unsupported  Able to stand safely 2 minutes    Sitting with Back Unsupported but Feet Supported on Floor or Stool  Able to sit safely and securely 2 minutes    Stand to Sit  Sits safely with minimal use of hands    Transfers  Able to transfer safely, minor use of hands    Standing Unsupported with Eyes Closed  Able to stand 10 seconds safely    Standing Ubsupported with Feet Together  Able to place feet together independently and stand 1 minute safely    From Standing, Reach Forward with Outstretched Arm  Can reach forward >12 cm safely (5")    From Standing Position, Pick up Object from Floor  Able to  pick up shoe safely and easily    From Standing Position, Turn to Look Behind Over each Shoulder  Looks behind from both sides and weight shifts well    Turn 360 Degrees  Able to turn 360 degrees safely but slowly    Standing Unsupported, Alternately Place Feet on Step/Stool  Able to stand independently and safely and complete 8 steps in 20 seconds    Standing Unsupported, One Foot in Front  Able to plae foot ahead of the other independently and hold 30 seconds    Standing on One Leg  Able to lift leg independently and hold 5-10 seconds    Total Score  51    Berg comment:  51/56                   [facility] Adult PT Treatment/Exercise - 04/24/18 0001      Ambulation/Gait   Ambulation/Gait  Yes    Ambulation/Gait Assistance  6: Modified independent  (Device/Increase time)    Ambulation/Gait Assistance Details  Patient is ambulating with SPC mod indep for household and community surfaces.  She can ambulate without device, however she has dec'd R weight shifting.    Ambulation Distance (Feet)  300 Feet    Assistive device  Straight cane    Ambulation Surface  Level;Indoor    Gait velocity  1.99 ft/sec      Self-Care   Self-Care  Other Self-Care Comments    Other Self-Care Comments   Patient and PT discussed driving.  PT discussed R LE and UE impairments hindering driving unless modifications made to car.  Also discussed cognitive/multi-tasking demands of driving.  PT discussed driving evaluation months from now.  Discussed Bioness and the patient inquired about use.  PT recommended the patient continue with current HEP and walking as this is more active engagement/recruitment of R LE motor units.        Therapeutic Activites    Therapeutic Activities  Other Therapeutic Activities    Other Therapeutic Activities  floor<>stand with L UE support through mat tble with supervision.               PT Short Term Goals - 02/26/18 0945      PT SHORT TERM GOAL #1   Title  The patient will ambulate on level community surfaces with SPC mod indep for improved mobility.    Baseline  Using [facility] in community for limited surfaces.    Time  4    Period  Weeks    Status  Achieved      PT SHORT TERM GOAL #2   Title  The patient will improve gait speed from 0.85 ft/sec to > or equal to 1.2 ft/sec to demonstrate transition from "household ambulator" to "limited community ambulator" classification of gait.    Baseline  1.17 ft/sec on 01/28/18    Time  4    Period  Weeks    Status  Partially Met      PT SHORT TERM GOAL #3   Title  The patient will improve Berg balance score from 42/56 to > or equal to 45/56 to demo dec'ing risk for falls.    Baseline  Berg=45/56 on 01/28/18    Time  4    Period  Weeks    Status  Achieved      PT SHORT TERM GOAL  #4   Title  The patient will ambulate x 600 ft nonstop to demo improved endurance for functional gait.  Baseline  Patient able to ambulate nonstop x 600 ft mod indep on level surfaces with SPc.     Time  4    Period  Weeks    Status  Achieved        PT Long Term Goals - 04/23/18 1347      PT LONG TERM GOAL #1   Title  The patient will perform updated HEP with intermittent assist for post d/c progression of exercise. (UPDATED LTG DATE for REMAINING UNMET LTGS: 04/27/18)    Baseline  03/23/18: met as patient is doing prescribed exercises and walking as well as aquatic therapy, may need to be advanced after next 4 week cert.     Status  Achieved      PT LONG TERM GOAL #2   Title  The patient will move floor<>stand with mod indep using L UE support on surface due to h/o falls.    Baseline  floor<>stand supervision.    Status  Partially Met      PT LONG TERM GOAL #3   Title  The patient will improve gait speed from 1.92 ft/sec to > or equal to 2.5 ft/sec to demo dec'ing risk for falls.    Baseline  1.99 ft/sec.    Status  Not Met      PT LONG TERM GOAL #4   Title  The patient will improve Berg score from 49/56 (on 02/25/2018) up to 52/56 to demo dec'ing risk for falls.    Baseline  51/56.    Status  Partially Met      PT LONG TERM GOAL #5   Title  The patient will negotiate 12 steps with a handrail (light support only)with reciprocal pattern mod indep to access home entries.    Baseline  On 10/28, patient is Mod I with single rail/step to pattern. supervision for reciprocal pattern.    Status  Partially Met            Plan - 04/24/18 0900    Clinical Impression Statement  The patient has partially met LTGs and had HEP for continued strengthening.  The patient and her brother in law had questions answered regarding home program, when to return to therapy in the future (discussed in functional change or difficulty with current HEP).      PT Treatment/Interventions  ADLs/Self Care  Home Management;Gait training;Stair training;Functional mobility training;Therapeutic activities;Therapeutic exercise;Balance training;Neuromuscular re-education;Patient/family education;Orthotic Fit/Training;Electrical Stimulation;Manual techniques;DME Instruction    PT Next Visit Plan  Discharge today.    Consulted and Agree with Plan of Care  Patient;Family member/caregiver    Family Member Consulted  brother in law       Patient will benefit from skilled therapeutic intervention in order to improve the following deficits and impairments:  Abnormal gait, Impaired sensation, Pain, Postural dysfunction, Impaired tone, Decreased mobility, Decreased coordination, Decreased activity tolerance, Decreased endurance, Decreased strength, Difficulty walking, Decreased balance  Visit Diagnosis: Unsteadiness on feet  Muscle weakness (generalized)  Other symptoms and signs involving the nervous system  Other abnormalities of gait and mobility    PHYSICAL THERAPY DISCHARGE SUMMARY  Visits from Start of Care: 42  Current functional level related to goals / functional outcomes: See above   Remaining deficits: R hemimotor impairments s/p CVA Abnormality of gait   Education / Equipment: Home program, safety, community exercise.   Plan: Patient agrees to discharge.  Patient goals were partially met. Patient is being discharged due to meeting the stated rehab goals.  ?????  Thank you for the referral of this patient. Rudell Cobb, MPT  [place], PT 04/24/2018, 9:01 AM  [facility] [address] [place], [place], [address] Phone: [phone]   Fax:  [phone]  Name: Ysabel Stankovich MRN: [id number] Date of Birth: [birth year]

## 2018-05-18 ENCOUNTER — Encounter: Payer: Self-pay | Admitting: Family Medicine

## 2018-05-18 ENCOUNTER — Ambulatory Visit (INDEPENDENT_AMBULATORY_CARE_PROVIDER_SITE_OTHER): Payer: BLUE CROSS/BLUE SHIELD | Admitting: Family Medicine

## 2018-05-18 VITALS — BP 117/80 | HR 80 | Temp 98.3°F | Resp 16 | Ht 62.0 in | Wt 127.0 lb

## 2018-05-18 DIAGNOSIS — G629 Polyneuropathy, unspecified: Secondary | ICD-10-CM

## 2018-05-18 DIAGNOSIS — I69321 Dysphasia following cerebral infarction: Secondary | ICD-10-CM | POA: Diagnosis not present

## 2018-05-18 DIAGNOSIS — I69351 Hemiplegia and hemiparesis following cerebral infarction affecting right dominant side: Secondary | ICD-10-CM

## 2018-05-18 DIAGNOSIS — I639 Cerebral infarction, unspecified: Secondary | ICD-10-CM | POA: Diagnosis not present

## 2018-05-18 DIAGNOSIS — I429 Cardiomyopathy, unspecified: Secondary | ICD-10-CM | POA: Diagnosis not present

## 2018-05-18 DIAGNOSIS — R9430 Abnormal result of cardiovascular function study, unspecified: Secondary | ICD-10-CM

## 2018-05-18 DIAGNOSIS — Z8673 Personal history of transient ischemic attack (TIA), and cerebral infarction without residual deficits: Secondary | ICD-10-CM

## 2018-05-18 DIAGNOSIS — R931 Abnormal findings on diagnostic imaging of heart and coronary circulation: Secondary | ICD-10-CM

## 2018-05-18 NOTE — Progress Notes (Signed)
[place]on, Haecker Nov 20, 1960, 57 y.o., female MRN: [id number] Patient Care Team    Relationship Specialty Notifications Start End  Ma Hillock, DO PCP - General Family Medicine  09/16/17     Chief Complaint  Patient presents with  . forms    disability forms     Subjective: Pt presents with her sister today, who helps with HPI.    Neuropathy/aphasia/weakness/CVA/Hemiparesis of right dominant side as late effect of cerebral infarction (HCC)/Cardioembolic stroke (HCC)/dysarthria Finished working with outpt PT/OT and speech last month. She has neuro appt next month. Really coming along nicely with her strength of her legs. She is still very week of her right upper extremity and is concerned it is not better.  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   She is fluent in multiple languages, so it is not a comprehension issue. She is also working with counseling for more confidence with her speech changes since her stroke. She is compliant with atorvastatin, baclofen, gabapentin, PPI. lopressor and eliquis. Cardiomyopathy, unspecified type (HCC)/Cardiac LV ejection fraction 21-30% She is established with cardiology last echocardiogram 02/03/2018 - Left ventricle: The cavity size was normal. Wall thickness was   normal. Systolic function was severely reduced. The estimated   ejection fraction was in the range of 25% to 30%. Diffuse   hypokinesis. Features are consistent with a pseudonormal left   ventricular filling pattern, with concomitant abnormal relaxation   and increased filling pressure (grade 2 diastolic dysfunction).   Doppler parameters are consistent with high ventricular filling   pressure. - Mitral valve: There was mild regurgitation. - Left atrium: The atrium was mildly dilated. Impressions: - Severe global reduction in LV systolic function; moderate   diastolic dysfunction; mild MR; mild LAE.  Depression screen [facility] 2/9 09/16/2017  Decreased Interest 0  Down,  Depressed, Hopeless 0  PHQ - 2 Score 0    Allergies  Allergen Reactions  . Dust Mite Extract   . Penicillins    Social History   Tobacco Use  . Smoking status: Former Research scientist (life sciences)  . Smokeless tobacco: Never Used  Substance Use Topics  . Alcohol use: Never    Frequency: Never   Past Medical History:  Diagnosis Date  . Aphasia 09/17/2017  . 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  . Cardiac LV ejection fraction 21-30%   . Cardioembolic stroke ([place]) 40/98/1191   Right hemiparesis  . Cardiomyopathy ([place]) 04/2017  . Dysphagia 05/19/2017   Last known diet upgraded to regular diet with thin liquids on 06/27/2017, no records available  . 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 Fall 09/17/2017  . Graves disease   . H/O ischemic left MCA stroke 09/17/2017  . Hemiparesis of right dominant side as late effect of cerebral infarction ([place]) 09/17/2017  . Hyperlipidemia   . Hypertension   . Neuropathy 05/19/2017   s/p stroke  . Plaque psoriasis   . Shoulder subluxation, right, initial encounter 09/17/2017  . Weakness 09/17/2017   Past Surgical History:  Procedure Laterality Date  . NO PAST SURGERIES     Family History  Problem Relation Age of Onset  . Hyperlipidemia Mother   . Hypertension Mother   . Early death Father   . Hypertension Sister   . Heart attack Brother   . Mental illness Brother    Allergies as of 05/18/2018      Reactions  Dust Mite Extract    Penicillins       Medication List        Accurate as of 05/18/18  3:15 PM. Always use your most recent med list.          acetaminophen 325 MG tablet Commonly known as:  TYLENOL Take 650 mg by mouth every 6 (six) hours as needed.   apixaban 5 MG Tabs tablet Commonly known as:  ELIQUIS Take 1 tablet (5 mg total) by mouth 2 (two) times daily.   atorvastatin 40 MG tablet Commonly known as:  LIPITOR Take 1  tablet (40 mg total) by mouth daily.   Baclofen 5 MG Tabs Take 5 mg by mouth 3 (three) times daily.   carvedilol 3.125 MG tablet Commonly known as:  COREG Take 1 tablet (3.125 mg total) by mouth 2 (two) times daily.   clobetasol cream 0.05 % Commonly known as:  TEMOVATE Apply 1 application topically 2 (two) times daily.   esomeprazole 40 MG capsule Commonly known as:  NEXIUM Take 1 capsule (40 mg total) by mouth daily at 12 noon.   gabapentin 300 MG capsule Commonly known as:  NEURONTIN Take 2 capsules (600 mg total) by mouth at bedtime.   OVER THE COUNTER MEDICATION CBD Oil       All past medical history, surgical history, allergies, family history, immunizations andmedications were updated in the EMR today and reviewed under the history and medication portions of their EMR.     ROS: Negative, with the exception of above mentioned in HPI   Objective:  BP 117/80 (BP Location: Left Arm, Patient Position: Sitting, Cuff Size: Normal)   Pulse 80   Temp 98.3 F (36.8 C) (Oral)   Resp 16   Ht 5\' 2"  (1.575 m)   Wt 127 lb (57.6 kg)   SpO2 96%   BMI 23.23 kg/m  Body mass index is 23.23 kg/m.  Gen: Afebrile. No acute distress. Nontoxic in appearance.  HENT: AT. [place].  MMM.  Eyes:Pupils Equal Round Reactive to light, Extraocular movements intact,  Conjunctiva without redness, discharge or icterus. CV: RRR no murmur, no edema Chest: CTAB, no wheeze or crackles Skin: no rashes, purpura or petechiae.  Neuro/MSK: Walking w/ cane.Marland Kitchen PERLA. EOMi. Alert. Oriented. Cranial nerves II through XII intact. RUE with weakness- needs to assistance with left hand to move right hand and arm. Right lower ext in AFO Psych: Normal affect, dress and demeanor. Normal speech. Normal thought content and judgment.   No exam data present No results found. No results found for this or any previous visit (from the past 24 hour(s)).  Assessment/Plan: [place] is a 57 y.o. female present for OV  for  Hemiparesis of right dominant side as late effect of cerebral infarction (HCC)/Cardioembolic stroke (HCC)/weakness/aphasia/dysphagia/ left MCA stroke/dysarthria/difficulty coping with disease - Has done rather well considering CVA of MCA, hemiparesis of dominant hand and cardiomyopathy. She unfortunately has not had a full recovery and does suffer from a now permanent disability secondary to CVA that occurred on 05/19/2017 in [place]. - forms completed today for Long term disability.  - pt has worked hard with outpatient PT/OT/speech therapy - Continue atorvastatin 40 mg daily, Eliquis 5 mg twice daily. -Continue gabapentin 600 mg nightly. -Continue baclofen 5 mg TID. -Continue metoprolol 25 mg twice daily, prescribed by cardiology -She is established with both cardiology and neurology. -established with psychology, Dr. Gaynell Face, to help with comfidence surrounding speech.    Cardiomyopathy, unspecified type (HCC)/Cardiac LV  ejection fraction 21-30% -Continue to follow with cardiology.  Viewed last echo which still had diminished ejection fraction.  Neuropathy Stable.  Gabapentin 600 mg nightly continued.    Follow up 6 months, unless needing additional referrals to outpt PT/OT/speech, then needs seen every 3 months.     Reviewed expectations re: course of current medical issues.  Discussed self-management of symptoms.  Outlined signs and symptoms indicating need for more acute intervention.  Patient verbalized understanding and all questions were answered.  Patient received an After-Visit Summary.    No orders of the defined types were placed in this encounter.   > 25 minutes spent with patient, >50% of time spent face to face counseling and coordinating care.    Note is dictated utilizing voice recognition software. Although note has been proof read prior to signing, occasional typographical errors still can be missed. If any questions arise, please do not hesitate  to call for verification.   electronically signed by:  Howard Pouch, DO  [place]

## 2018-05-18 NOTE — Patient Instructions (Signed)
We will fax your forms and records today or tomorrow- with copies of our records and labs.   Hope you have a wonderful Thanksgiving!!!

## 2018-06-03 ENCOUNTER — Telehealth: Payer: Self-pay

## 2018-06-03 NOTE — Telephone Encounter (Signed)
Phone call placed to alternate contact (patient's number not working). VM left to schedule visit with NP.

## 2018-06-21 NOTE — Progress Notes (Addendum)
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  She is accompanied by her sister and brother-in-law who supplement history.  UPDATE: Current medications:  Eliquis, atorvastatin 40mg  LDL from June was 97.  She finished PT a couple of months ago.  She finished speech therapy in August. It was helpful and would like to continue it.  She walks daily.  She stopped baclofen and her right arm aches.  HISTORY: She was admitted to [facility] in [place] on 05/19/17 for left MCA stroke.  She was found to have a thrombus on imaging but was determined not to be a candidate for intervention due to completed stroke.  She was diagnosed with cardiomyopathy as echocardiogram demonstrated a LV EF of 15-20%.  Other testing is not available to me at this time.  Repeat echocardiogram on 11/30 showed improved EF of 20-25%.  It was believed that her stroke was likely cardioembolic secondary to undiagnosed cardiomyopathy.  She was started on Coumadin and subsequently .  She had developed significant dysphagia.  MBS on 06/27/17 indicated that her diet could be upgraded to regular diet with thin liquids.  She was admitted to inpatient rehab until March.   She has since moved to [place] to live with her sister until she recovers well enough to move back to [place] alone.  She continues to have residual right sided upper and lower extremity weakness.  She is currently undergoing outpatient rehab (speech/PT/OT).  Speech is improved.  She is swallowing well and is on a regular diet.  Strength in her right leg is improved and is now able to ambulate with cane.  She is still plegic in the right arm.  She is independent and manages her finances and performs  ADLs.  Most recent echocardiogram from 08/20/17 showed EF of 15-20%.  Marland Kitchen  PAST MEDICAL HISTORY: Past Medical History:  Diagnosis Date  . Aphasia 09/17/2017  . 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  . Cardiac LV ejection fraction 21-30%   . Cardioembolic stroke ([place]) 54/00/8676   Right hemiparesis  . Cardiomyopathy ([place]) 04/2017  . Dysphagia 05/19/2017   Last known diet upgraded to regular diet with thin liquids on 06/27/2017, no records available  . 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 Fall 09/17/2017  . Graves disease   . H/O ischemic left MCA stroke 09/17/2017  . Hemiparesis of right dominant side as late effect of cerebral infarction ([place]) 09/17/2017  . Hyperlipidemia   . Hypertension   . Neuropathy 05/19/2017   s/p stroke  . Plaque psoriasis   . Shoulder subluxation, right, initial encounter 09/17/2017  . Weakness 09/17/2017    MEDICATIONS: Current Outpatient Medications on File Prior to Visit  Medication Sig Dispense Refill  . acetaminophen (TYLENOL) 325 MG tablet Take 650 mg by mouth every 6 (six) hours as needed.    Marland Kitchen apixaban (ELIQUIS) 5 MG TABS tablet Take 1 tablet (5 mg total) by mouth 2 (two) times daily. 180 tablet 3  . atorvastatin (LIPITOR) 40 MG tablet Take 1 tablet (40 mg total) by mouth daily. 90 tablet 3  . Baclofen 5 MG TABS Take 5 mg by mouth 3 (three) times daily.  270 tablet 1  . carvedilol (COREG) 3.125 MG tablet Take 1 tablet (3.125 mg total) by mouth 2 (two) times daily. 180 tablet 3  . clobetasol cream (TEMOVATE) 2.40 % Apply 1 application topically 2 (two) times daily. 30 g 0  . esomeprazole (NEXIUM) 40 MG capsule Take 1 capsule (40 mg total) by mouth daily at 12 noon. 90 capsule 3  . gabapentin (NEURONTIN) 300 MG capsule Take 2 capsules (600 mg total) by mouth at bedtime. 180 capsule 1  . OVER THE COUNTER MEDICATION CBD Oil     No current  facility-administered medications on file prior to visit.     ALLERGIES: Allergies  Allergen Reactions  . Dust Mite Extract   . Penicillins     FAMILY HISTORY: Family History  Problem Relation Age of Onset  . Hyperlipidemia Mother   . Hypertension Mother   . Early death Father   . Hypertension Sister   . Heart attack Brother   . Mental illness Brother    SOCIAL HISTORY: Social History   Socioeconomic History  . Marital status: Divorced    Spouse name: Not on file  . Number of children: 1  . Years of education: 77  . Highest education level: Professional school degree (e.g., MD, DDS, DVM, JD)  Occupational History  . 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 Financial resource strain: Not on file  . Food insecurity:    Worry: Not on file    Inability: Not on file  . Transportation needs:    Medical: Not on file    Non-medical: Not on file  Tobacco Use  . Smoking status: Former Research scientist (life sciences)  . Smokeless tobacco: Never Used  Substance and Sexual Activity  . Alcohol use: Never    Frequency: Never  . Drug use: Never  . Sexual activity: Not Currently  Lifestyle  . Physical activity:    Days per week: Not on file    Minutes per session: Not on file  . Stress: Not on file  Relationships  . Social connections:    Talks on phone: Not on file    Gets together: Not on file    Attends religious service: Not on file    Active member of club or organization: Not on file    Attends meetings of clubs or organizations: Not on file    Relationship status: Not on file  . Intimate partner violence:    Fear of current or ex partner: Not on file    Emotionally abused: Not on file    Physically abused: Not on file    Forced sexual activity: Not on file  Other Topics Concern  . Not on file  Social History Narrative   Divorced.  From [place], moved to [place] to stay with her family after having a stroke March 2019   Graduate degree, works as a Community education officer.   Former  smoker.   Exercise routinely prior to stroke.   Wears a hearing aid.   Smoke alarms in the home.   Wears her seatbelt.      Patient is right-handed. She drinks one cup of coffee a day. She now resides with her sister and brother-in-law.    REVIEW OF SYSTEMS: Constitutional: No fevers, chills, or sweats, no generalized fatigue, change in appetite Eyes: No visual changes, double vision, eye pain Ear, nose and throat: No hearing loss, ear pain, nasal congestion, sore throat Cardiovascular: No chest pain, palpitations Respiratory:  No shortness of breath at  rest or with exertion, wheezes GastrointestinaI: No nausea, vomiting, diarrhea, abdominal pain, fecal incontinence Genitourinary:  No dysuria, urinary retention or frequency Musculoskeletal:  No neck pain, back pain Integumentary: No rash, pruritus, skin lesions Neurological: as above Psychiatric: sometimes tearful but denies depression Endocrine: No palpitations, fatigue, diaphoresis, mood swings, change in appetite, change in weight, increased thirst Hematologic/Lymphatic:  No purpura, petechiae. Allergic/Immunologic: no itchy/runny eyes, nasal congestion, recent allergic reactions, rashes  PHYSICAL EXAM: Blood pressure 94/60, pulse 72, height 5\' 2"  (1.575 m), weight 125 lb (56.7 kg), SpO2 95 %. General: No acute distress.  Patient appears well-groomed.   Head:  Normocephalic/atraumatic Eyes:  Fundi examined but not visualized Neck: supple, no paraspinal tenderness, full range of motion Heart:  Regular rate and rhythm Lungs:  Clear to auscultation bilaterally Back: No paraspinal tenderness Neurological Exam: alert and oriented to person, place, and time. Attention span and concentration intact, recent and remote memory intact, fund of knowledge intact.  Speech fluent and not dysarthric, slight hesitancy repeating.  Otherwise, language intact.  Slight right lower facial weakness.  Otherwise, CN II-XII intact. Bulk normal.  Slight  increased tone at the elbow and finters.  Right upper extremity plegic except muscle strength 1+/5 right finger flexion, 4+/5 right ankle dorsiflexion.  Otherwise 5/5 throughout.  Sensation to light touch, temperature and vibration intact.  Deep tendon reflexes 3+ right upper and lower extremities, 2+ left upper and lower extremities, toes downgoing.  Left finger to nose without dysmetria.  Right hemiplegic gait.  Romberg negative.  IMPRESSION: 1.  Right hemiparesis and expressive aphasia secondary to left MCA stroke, likely cardioembolic  PLAN: 1.  Eliquis for secondary stroke prevention 2.  Atorvastatin 40mg  daily.  Repeat lipid panel 3.  Will reorder PT/OT/speech therapy 4.  Restart baclofen 5mg  three times daily for arm pain/spasms. 5.  I will refer her to Dr. Alysia Penna (PM&R) for any other recommendations regarding post-stroke management. 6.  Follow up in 6 months.  25 minutes spent face to face with patient, over 50% spent discussing management.  Metta Clines, DO  CC: Howard Pouch, DO

## 2018-06-22 ENCOUNTER — Other Ambulatory Visit (INDEPENDENT_AMBULATORY_CARE_PROVIDER_SITE_OTHER): Payer: BLUE CROSS/BLUE SHIELD

## 2018-06-22 ENCOUNTER — Encounter: Payer: Self-pay | Admitting: Neurology

## 2018-06-22 ENCOUNTER — Ambulatory Visit (INDEPENDENT_AMBULATORY_CARE_PROVIDER_SITE_OTHER): Payer: BLUE CROSS/BLUE SHIELD | Admitting: Neurology

## 2018-06-22 VITALS — BP 94/60 | HR 72 | Ht 62.0 in | Wt 125.0 lb

## 2018-06-22 DIAGNOSIS — I639 Cerebral infarction, unspecified: Secondary | ICD-10-CM | POA: Diagnosis not present

## 2018-06-22 DIAGNOSIS — I69320 Aphasia following cerebral infarction: Secondary | ICD-10-CM | POA: Diagnosis not present

## 2018-06-22 DIAGNOSIS — Z8673 Personal history of transient ischemic attack (TIA), and cerebral infarction without residual deficits: Secondary | ICD-10-CM | POA: Diagnosis not present

## 2018-06-22 DIAGNOSIS — I69351 Hemiplegia and hemiparesis following cerebral infarction affecting right dominant side: Secondary | ICD-10-CM

## 2018-06-22 LAB — LIPID PANEL
CHOLESTEROL: 177 mg/dL (ref 0–200)
HDL: 58.3 mg/dL (ref >39.00)
LDL Cholesterol: 108 mg/dL — ABNORMAL HIGH (ref 0–99)
NonHDL: 118.75
Total CHOL/HDL Ratio: 3
Triglycerides: 56 mg/dL (ref 0.0–149.0)
VLDL: 11.2 mg/dL (ref 0.0–40.0)

## 2018-06-22 NOTE — Patient Instructions (Signed)
1.  Continue Eliquis 2.  Continue atorvastatin.  We will recheck lipid panel 3.  We will reorder physical therapy, occupational therapy and speech therapy 4.  I will refer you to Dr. Alysia Penna to see if he has other recommendations for treatment  5.  Follow up in 6 months.

## 2018-06-23 ENCOUNTER — Telehealth: Payer: Self-pay

## 2018-06-23 ENCOUNTER — Telehealth: Payer: Self-pay | Admitting: Neurology

## 2018-06-23 DIAGNOSIS — I63412 Cerebral infarction due to embolism of left middle cerebral artery: Secondary | ICD-10-CM

## 2018-06-23 MED ORDER — ATORVASTATIN CALCIUM 80 MG PO TABS: 80.0000 mg | ORAL_TABLET | Freq: Every day | ORAL | 6 refills | Status: DC

## 2018-06-23 NOTE — Telephone Encounter (Signed)
Spoke with sister, explained lipid results.

## 2018-06-23 NOTE — Telephone Encounter (Signed)
-----   Message from [name], DO sent at 06/23/2018  7:31 AM EST ----- The LDL (bad cholesterol) overall looks okay.  However, I would like the number lower.  Recommend increasing atorvastatin to 80mg  daily and repeating lipid panel prior to follow up.

## 2018-06-23 NOTE — Telephone Encounter (Signed)
Called and spoke with sister, went over results. Sending in new increased Rx

## 2018-06-23 NOTE — Telephone Encounter (Signed)
Patient sister needs help in understanding the test result

## 2018-06-23 NOTE — Telephone Encounter (Signed)
Called and LMOVM for return call 

## 2018-06-29 ENCOUNTER — Other Ambulatory Visit: Payer: Self-pay

## 2018-06-29 DIAGNOSIS — I69351 Hemiplegia and hemiparesis following cerebral infarction affecting right dominant side: Secondary | ICD-10-CM

## 2018-06-29 DIAGNOSIS — I69320 Aphasia following cerebral infarction: Secondary | ICD-10-CM

## 2018-07-07 ENCOUNTER — Telehealth: Payer: Self-pay | Admitting: Neurology

## 2018-07-07 DIAGNOSIS — I69320 Aphasia following cerebral infarction: Secondary | ICD-10-CM

## 2018-07-07 NOTE — Telephone Encounter (Signed)
Patient/Patient's sister both on phone wanting to speak with you. They would not give detailed as to why. Please call them back tomorrow when you get in. I told her you were out for the rest of today, she said it as not emergency. Please call [phone]. Thanks!

## 2018-07-08 NOTE — Telephone Encounter (Signed)
Called and spoke with Pt. It is difficult to understand her, however, she was able to say she wanted to take more gabapentin. I advised her she will need to discuss this with her PCP Dr. Raoul Pitch who Rx's that medication. Pt would like to cut down the Lipitor, I advised her it is important to maintain her cholesterol to prevent any further strokes. She asked about Rehab, said they have not contacted her. I told her I will call and check status. She asked I call later this afternoon when her sister will be there. I called [facility], spoke with [place]. She is to call Pt later this afternoon. She did not have order for speech therapy, added that order. Called PMR Dr. Letta Pate office to check status, LMOVM for referral coordinator to contact Pt

## 2018-07-08 NOTE — Telephone Encounter (Signed)
Patient is calling in again, please call. Thanks!

## 2018-07-09 ENCOUNTER — Telehealth: Payer: Self-pay | Admitting: Neurology

## 2018-07-09 NOTE — Telephone Encounter (Signed)
Called and spoke with sister, Houria. I gave her the telephone number to [facility] and to Dr. Alysia Penna to schedule. We discussed the Lipitor. The Pt has only been taking the increased amount (80mg ) for 1 week. Pt is c/o of generalized weakness. Houria states the Pt is basically starving herself so she will not have to take Lipitor. I advised Houria this is most likely the cause of the weakness. Houria said she is the one who does the cooking and cooks healthy, but Pt will barely eat. I advised Houria the Pt needs to eat lean protein for energy, as well as fruits and vegetables. The call was on speaker for Pt to also hear. She was concerned about eating eggs, I advised her eggs were ok in moderation, just not fried. Pt agrees to continue the 80 mg Lipitor for now and see how she feels. Will call back if continues to feel badly.

## 2018-07-09 NOTE — Telephone Encounter (Signed)
Patient's sister is calling in about referral and about medication questions. Please call her back at [phone]. Thanks!

## 2018-07-13 ENCOUNTER — Telehealth: Payer: Self-pay | Admitting: Neurology

## 2018-07-13 NOTE — Telephone Encounter (Signed)
Lattie Haw from [facility] called regarding a Referral for this patient. She said she did not receive one? She asked could you re fax the order to [phone]. Thanks

## 2018-07-30 ENCOUNTER — Ambulatory Visit: Payer: BLUE CROSS/BLUE SHIELD | Attending: Family Medicine | Admitting: Occupational Therapy

## 2018-07-30 ENCOUNTER — Ambulatory Visit: Payer: BLUE CROSS/BLUE SHIELD | Admitting: Rehabilitative and Restorative Service Providers"

## 2018-07-30 ENCOUNTER — Ambulatory Visit: Payer: BLUE CROSS/BLUE SHIELD | Admitting: Speech Pathology

## 2018-07-30 ENCOUNTER — Encounter: Payer: Self-pay | Admitting: Occupational Therapy

## 2018-07-30 DIAGNOSIS — R41842 Visuospatial deficit: Secondary | ICD-10-CM | POA: Diagnosis present

## 2018-07-30 DIAGNOSIS — R208 Other disturbances of skin sensation: Secondary | ICD-10-CM | POA: Diagnosis present

## 2018-07-30 DIAGNOSIS — R278 Other lack of coordination: Secondary | ICD-10-CM

## 2018-07-30 DIAGNOSIS — M6281 Muscle weakness (generalized): Secondary | ICD-10-CM | POA: Diagnosis present

## 2018-07-30 DIAGNOSIS — R41841 Cognitive communication deficit: Secondary | ICD-10-CM | POA: Insufficient documentation

## 2018-07-30 DIAGNOSIS — R2689 Other abnormalities of gait and mobility: Secondary | ICD-10-CM | POA: Diagnosis present

## 2018-07-30 DIAGNOSIS — R4701 Aphasia: Secondary | ICD-10-CM

## 2018-07-30 DIAGNOSIS — I69351 Hemiplegia and hemiparesis following cerebral infarction affecting right dominant side: Secondary | ICD-10-CM

## 2018-07-30 DIAGNOSIS — I69318 Other symptoms and signs involving cognitive functions following cerebral infarction: Secondary | ICD-10-CM

## 2018-07-30 DIAGNOSIS — M25511 Pain in right shoulder: Secondary | ICD-10-CM | POA: Insufficient documentation

## 2018-07-30 DIAGNOSIS — R29818 Other symptoms and signs involving the nervous system: Secondary | ICD-10-CM

## 2018-07-30 DIAGNOSIS — R2681 Unsteadiness on feet: Secondary | ICD-10-CM | POA: Diagnosis present

## 2018-07-30 DIAGNOSIS — G8929 Other chronic pain: Secondary | ICD-10-CM | POA: Diagnosis present

## 2018-07-30 DIAGNOSIS — R471 Dysarthria and anarthria: Secondary | ICD-10-CM | POA: Insufficient documentation

## 2018-07-30 NOTE — Therapy (Signed)
[facility] [address] [place], [place], [address] Phone: [phone]   Fax:  [phone]  Occupational Therapy Evaluation  Patient Details  Name: Margaret Ortiz MRN: [id number] Date of Birth: 12/19/1960 Referring Provider (OT): Dr. Metta Clines   Encounter Date: 07/30/2018  OT End of Session - 07/30/18 1729    Visit Number  1    Number of Visits  12    Date for OT Re-Evaluation  09/28/18    Authorization Type  BCBS--awaiting insurance authorization    OT Start Time  1108    OT Stop Time  1150    OT Time Calculation (min)  42 min    Activity Tolerance  Patient tolerated treatment well    Behavior During Therapy  [facility] for tasks assessed/performed       Past Medical History:  Diagnosis Date  . Aphasia 09/17/2017  . 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  . Cardiac LV ejection fraction 21-30%   . Cardioembolic stroke ([place]) 64/15/8309   Right hemiparesis  . Cardiomyopathy ([place]) 04/2017  . Dysphagia 05/19/2017   Last known diet upgraded to regular diet with thin liquids on 06/27/2017, no records available  . 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 Fall 09/17/2017  . Graves disease   . H/O ischemic left MCA stroke 09/17/2017  . Hemiparesis of right dominant side as late effect of cerebral infarction ([place]) 09/17/2017  . Hyperlipidemia   . Hypertension   . Neuropathy 05/19/2017   s/p stroke  . Plaque psoriasis   . Shoulder subluxation, right, initial encounter 09/17/2017  . Weakness 09/17/2017    Past Surgical History:  Procedure Laterality Date  . NO PAST SURGERIES      There were no vitals filed for this visit.  Subjective Assessment - 07/30/18 1115    Subjective   Can you give met putty and I will do it    Patient is accompained by:  Family member   sister, brother in-law   Patient Stated Goals  improve  hand, improve pain    Currently in Pain?  Yes    Pain Score  7     Pain Location  Arm   R shoulder/neck   Pain Orientation  Right    Pain Descriptors / Indicators  Aching;Sore    Pain Type  Acute pain    Pain Radiating Towards  began 2-3 weeks ago    Pain Onset  1 to 4 weeks ago    Pain Frequency  Constant    Aggravating Factors   increased after exercise    Pain Relieving Factors  tylenol helping        [facility] OT Assessment - 07/30/18 0001      Assessment   Medical Diagnosis  CVA    Referring Provider (OT)  Dr. Metta Clines    Onset Date/Surgical Date  05/19/17    Hand Dominance  Right    Prior Therapy  extensive at this clinic from 10/2017-03/2018      Precautions   Precautions  Fall    Required Braces or Orthoses  Other Brace/Splint    Other Brace/Splint  R AFO      Balance Screen   Has the patient fallen in the past 6 months  Yes    How many times?  1   getting up from the floor (doing exercise)  Home  Environment   Family/patient expects to be discharged to:  Private residence    Lives With  --   sister, brother-in-law     Prior Function   Level of Independence  Independent    Vocation  Full time employment    Equities trader    Leisure  exercise (currently walks for 2hr/day total)      ADL   ADL comments  Pt/family report that pt is mod I with BADLs, except supervision for shower transfer      IADL   Prior Level of Function Light Housekeeping  sister reports that pt does not participate in home maintenance tasks other than laundry (however, per OT notes, but able to perform simple home maintnenace/snack prep)    Prior Level of Function Meal Prep  did not cook previously, per previous OT notes, pt able to perform snack prep    Community Mobility  Relies on family or friends for transportation      Mobility   Mobility Status  History of falls    Mobility Status Comments  Ambulates with single point cane, but does not use all the  time at home.  Also has RLE brace      Vision - History   Baseline Vision  Wears glasses all the time    Additional Comments  pt denies diplopia now.  Per previous OT notes, pt demo decr environmental scanning      Vision Assessment   Vision Assessment  Vision not tested   due to address previously with extensive therapy     Cognition   Overall Cognitive Status  Impaired/Different from baseline   Difficult to assess due to aphasia   Area of Impairment  Awareness;Safety/judgement    Safety/Judgement  Decreased awareness of safety;Decreased awareness of deficits    Safety and Judgement Comments  decr safety, decr awareness/understanding of how to address deficits appropriately/decr risk for injury/complications and promote normal/functional movement patterns despite previous extensive therapy    Awareness  --   decr awareness of cognitive deficits     Posture/Postural Control   Posture/Postural Control  Postural limitations    Postural Limitations  Rounded Shoulders    Posture Comments  Shortened R trunk, RUE with flexor synergy pattern with attempts of active movement (including shoulder hike)      Sensation   Light Touch  Impaired by gross assessment    Proprioception  Impaired by gross assessment      Coordination   Gross Motor Movements are Fluid and Coordinated  No    Fine Motor Movements are Fluid and Coordinated  No    Coordination  no active finger extension R hand      Perception   Perception  Impaired      Tone   Assessment Location  Right Upper Extremity      ROM / Strength   AROM / PROM / Strength  AROM;PROM      AROM   Overall AROM   Deficits    Overall AROM Comments  Limited RUE movement.  Pt with no true shoulder flex, only able to initiate movement in flexor synergy pattern with shoulder abduction (50%)/IR, elbow flex, finger flex (approx 25%) with shoulder elevation, R shoulder subluxation      PROM   Overall PROM   Within functional limits for tasks  performed    Overall PROM Comments  mild decr R shoulder at end range shoulder flex/abduction (approx 85%), but no pain reported,  distal RUE PROM WNL      Hand Function   Right Hand Gross Grasp  Impaired      RUE Tone   RUE Tone  Moderate                      OT Education - 07/30/18 1723    Education Details  OT Eval Results/POC, Possible contributors to pain (positioning with walking, particularly for extended periods), Recommended pt try placing hand in pocket when walking for support/positioning, Use of putty can incr spasticity and pt should try to incorporate RUE in functional tasks instead (wiping table, sliding across table for reach, stablizing bottle while opening with L hand), why participating more in home maintenance tasks can incr independence and opportunity for improved RUE functional use.    Person(s) Educated  Associate Professor)   sister, brother-in-law   Methods  Explanation    Comprehension  Verbalized understanding       OT Short Term Goals - 07/30/18 1746      OT SHORT TERM GOAL #1   Title  Pt/family independent with updated HEP for RUE-- check STGs 08/28/18    Time  4    Period  Weeks    Status  New      OT SHORT TERM GOAL #2   Title  Pt will report R shoulder/neck pain less than or equal to 5/10 for functional tasks.    Time  4    Period  Weeks    Status  New      OT SHORT TERM GOAL #3   Title  Pt will verbalize understanding of proper positioning of RUE to decr pain.    Time  4    Period  Weeks    Status  New      OT SHORT TERM GOAL #4   Title  ---      OT SHORT TERM GOAL #5   Title  --        OT Long Term Goals - 07/30/18 1748      OT LONG TERM GOAL #1   Title  Pt will perform simple home maintenance tasks mod I incorporating RUE as stabilizer or using hand-over-hand techniques when able.--check LTGs 09/28/18    Time  8    Period  Weeks    Status  New      OT LONG TERM GOAL #2   Title  Pt will report R shoulder/neck pain  less than or equal to 3/10 for ADLs.    Time  8    Period  Weeks    Status  New      OT LONG TERM GOAL #3   Title  --      OT LONG TERM GOAL #4   Title  --      OT LONG TERM GOAL #5   Title  --            Plan - 07/30/18 1732    Clinical Impression Statement  Pt is a 58 y.o. female referred to occupational therapy for R hemiparesis as late effect of CVA.  Pt s/p L CVA 05/19/2017 and completed extensive outpatient rehab at this clinic with occupational therapy ending 03/2018.  Pt returns to therapy with new R shoulder/neck pain that started in last 2-3 weeks.  Pt also with goals for updated HEP and improved RUE functional use.  Pt with PMH that includes: Graves Disease, aphasia, HTN, hyperlipidemia, cardiomyopathy, R shoulder subluxation, decr sensation  RUE, visuospatial deficits.  Pt presents today with R hemiparesis,with decr AROM, decr strength, decr coordination, spasticity pattern, decr dominant RUE functional use, and decr ADL/IADL performance.  Pt will benefit from occupational therapy to address these deficits for pain reduction, incr participation in ADLs/IADLs, and improved dominant RUE functional use.     Occupational Profile and client history currently impacting functional performance  Pt was independent prior to CVA and living in [place], [place].  Pt currently living with sister/brother-in-law and is not participating in household tasks, unable to work, drive, or live alone safely.    Occupational performance deficits (Please refer to evaluation for details):  ADL's;IADL's;Leisure;Work;Social Participation    Rehab Potential  Fair    OT Frequency  2x / week    OT Duration  4 weeks   +eval, followed by 1x week for 4 weeks (if needed, but may d/c after 4 weeks)   OT Treatment/Interventions  Self-care/ADL training;Electrical Stimulation;Therapeutic exercise;Patient/family education;Splinting;Neuromuscular education;Paraffin;Moist Heat;Fluidtherapy;Energy  conservation;Therapist, nutritional;Therapeutic activities;Cryotherapy;Ultrasound;DME and/or AE instruction;Manual Therapy;Passive range of motion;Cognitive remediation/compensation;Balance training    Plan  initiate updates to HEP, include functional tasks for RUE use as able, positioning of RUE with ambulation/functional tasks to decr pain    Clinical Decision Making  Several treatment options, min-mod task modification necessary    Consulted and Agree with Plan of Care  Patient;Family member/caregiver    Family Member Consulted  sister, brother-in-law       Patient will benefit from skilled therapeutic intervention in order to improve the following deficits and impairments:  Abnormal gait, Decreased mobility, Difficulty walking, Impaired sensation, Impaired vision/preception, Pain, Improper body mechanics, Impaired perceived functional ability, Decreased range of motion, Decreased knowledge of precautions, Decreased cognition, Decreased coordination, Decreased safety awareness, Decreased strength, Impaired UE functional use, Improper spinal/pelvic alignment  Visit Diagnosis: Hemiplegia and hemiparesis following cerebral infarction affecting right dominant side (HCC)  Visuospatial deficit  Other symptoms and signs involving the nervous system  Unsteadiness on feet  Other abnormalities of gait and mobility  Other disturbances of skin sensation  Other symptoms and signs involving cognitive functions following cerebral infarction  Other lack of coordination    Problem List Patient Active Problem List   Diagnosis Date Noted  . Difficulty coping with disease 03/31/2018  . Hemiparesis of right dominant side as late effect of cerebral infarction ([place]) 09/17/2017  . Cardioembolic stroke ([place]) 74/05/8785  . 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 Cardiomyopathy ([place]) 09/17/2017  . H/O ischemic left MCA stroke 09/17/2017  . Cardiac LV ejection  fraction 21-30%   . Graves disease   . Plaque psoriasis   . Neuropathy 05/19/2017    [facility] 07/30/2018, 6:22 PM  [facility] [address] [place], [place], [address] Phone: [phone]   Fax:  [phone]  Name: Darlette Dubow MRN: [id number] Date of Birth: [birth year]   Vianne Bulls, OTR/L [facility] [address] [place], [place]  [address] [phone] phone [phone] 07/30/18 6:22 PM

## 2018-07-30 NOTE — Therapy (Signed)
[facility] [address] [place], [place], [address] Phone: [phone]   Fax:  [phone]  Physical Therapy Evaluation  Patient Details  Name: Margaret Ortiz MRN: [id number] Date of Birth: 19-Jun-1961 Referring Provider (PT): Metta Clines, DO   Encounter Date: 07/30/2018  PT End of Session - 07/30/18 1343    Visit Number  1    Number of Visits  8    Date for PT Re-Evaluation  09/28/18    Authorization Type  BCBS    PT Start Time  1025    PT Stop Time  1105    PT Time Calculation (min)  40 min    Activity Tolerance  Patient tolerated treatment well    Behavior During Therapy  [facility] for tasks assessed/performed       Past Medical History:  Diagnosis Date  . Aphasia 09/17/2017  . 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  . Cardiac LV ejection fraction 21-30%   . Cardioembolic stroke ([place]) 19/41/7408   Right hemiparesis  . Cardiomyopathy ([place]) 04/2017  . Dysphagia 05/19/2017   Last known diet upgraded to regular diet with thin liquids on 06/27/2017, no records available  . 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 Fall 09/17/2017  . Graves disease   . H/O ischemic left MCA stroke 09/17/2017  . Hemiparesis of right dominant side as late effect of cerebral infarction ([place]) 09/17/2017  . Hyperlipidemia   . Hypertension   . Neuropathy 05/19/2017   s/p stroke  . Plaque psoriasis   . Shoulder subluxation, right, initial encounter 09/17/2017  . Weakness 09/17/2017    Past Surgical History:  Procedure Laterality Date  . NO PAST SURGERIES      There were no vitals filed for this visit.   Subjective Assessment - 07/30/18 1027    Subjective  The patient is known to our clinic from prior CVA 05/19/2017 with extensive therapy ending 04/23/2018.  She returns today noting that she wants to get rid of brace (R AFO).  She  reports she walks 30 minutes x 2 reps 2 times per day. Her brother in law reports he got her a stationery bike, but Margaret Ortiz is having a hard time keeping her foot on the pedal.  He recently purchased a velcro strap to assist.  Margaret Ortiz wants to use exercise equipment in the therapy gym. We discussed if she has an interest in joining a gym, b/c if not we may want to spend our time focusing on a home program that can be carried out after discharge.    Patient is accompained by:  Family member   sister, and brother in law, steve   Patient Stated Goals  get rid of brace, use exercise equipment.  She wants to do floor exercises.    Currently in Pain?  Yes    Pain Score  7    after seeing Dr. Tomi Likens   Pain Location  Arm   into neck   Pain Orientation  Right    Pain Descriptors / Indicators  Aching;Sore    Pain Type  Acute pain    Pain Radiating Towards  began 2-3 weeks ago    Pain Onset  1 to 4 weeks ago    Aggravating Factors   unsure; thinks a way she is exercising is aggravating    Pain Relieving Factors  salve and tylenol helping  Effect of Pain on Daily Activities  taking tylenol         [facility] PT Assessment - 07/30/18 1058      Assessment   Medical Diagnosis  CVA    Referring Provider (PT)  Metta Clines, DO    Onset Date/Surgical Date  05/19/17    Prior Therapy  known to our clinic from extensive therapy      Precautions   Precautions  Fall    Required Braces or Orthoses  Other Brace/Splint    Other Brace/Splint  R AFO      Restrictions   Weight Bearing Restrictions  No      Balance Screen   Has the patient fallen in the past 6 months  No    Has the patient had a decrease in activity level because of a fear of falling?   No    Is the patient reluctant to leave their home because of a fear of falling?   No      Home Environment   Living Environment  Private residence    Living Arrangements  Other relatives    Type of [place] to enter    Entrance  Stairs-Number of Steps  2    Entrance Stairs-Rails  None    Home Equipment  Shower seat;Cane - single point      Prior Function   Level of Independence  Independent    Vocation  Full time employment    Equities trader    Leisure  exercise      Cognition   Overall Cognitive Status  --   has aphasia from CVA     ROM / Strength   AROM / PROM / Strength  AROM;Strength      AROM   Overall AROM Comments  Limited in R ankle ROM to approximately -5 degrees from neutral (tightness in heel cord)      Strength   Overall Strength  Deficits    Overall Strength Comments  Patient uses R hip to attempt to move ankle when AFO removed.  She is able to isolate plantar flexion movement, but cannot perform isolated ankle DF movement.      Transfers   Transfers  Sit to Stand;Stand to Sit    Sit to Stand  7: Independent    Stand to Sit  7: Independent      Ambulation/Gait   Ambulation/Gait  Yes    Ambulation/Gait Assistance  6: Modified independent (Device/Increase time)    Ambulation Distance (Feet)  400 Feet    Assistive device  Straight cane    Gait Pattern  Decreased stance time - right;Decreased step length - left;Decreased arm swing - right;Step-through pattern;Decreased hip/knee flexion - right;Right circumduction    Ambulation Surface  Level;Indoor    Gait velocity  1.42 ft/sec without device and 1.89 ft/sec with SPC    Curb  4: Min assist   patient fearful and needs min A to try   Gait Comments  *The patient inquires about walking without AFO and wants to demonstrate this.  She is able to perform a R steppage gait and a step to pattern.  PT and patient discussed that she should continue using AFO as the motor learning with the brace is what we want her brain to feel.  We want to avoid the steppage gait with step to pattern that she has to use to safely walk without the brace.  Standardized Balance Assessment   Standardized Balance Assessment  Berg Balance  Test      Berg Balance Test   Sit to Stand  Able to stand without using hands and stabilize independently    Standing Unsupported  Able to stand safely 2 minutes    Sitting with Back Unsupported but Feet Supported on Floor or Stool  Able to sit safely and securely 2 minutes    Stand to Sit  Sits safely with minimal use of hands    Transfers  Able to transfer safely, minor use of hands    Standing Unsupported with Eyes Closed  Able to stand 10 seconds safely    Standing Ubsupported with Feet Together  Able to place feet together independently and stand 1 minute safely    From Standing, Reach Forward with Outstretched Arm  Can reach forward >12 cm safely (5")    From Standing Position, Pick up Object from Floor  Able to pick up shoe safely and easily    From Standing Position, Turn to Look Behind Over each Shoulder  Looks behind one side only/other side shows less weight shift    Turn 360 Degrees  Able to turn 360 degrees safely but slowly    Standing Unsupported, Alternately Place Feet on Step/Stool  Able to complete >2 steps/needs minimal assist    Standing Unsupported, One Foot in Front  Able to take small step independently and hold 30 seconds    Standing on One Leg  Tries to lift leg/unable to hold 3 seconds but remains standing independently    Total Score  44    Berg comment:  44/56 reduced from 51/56 at d/c 04/23/2018                Objective measurements completed on examination: See above findings.                PT Short Term Goals - 07/30/18 1350      PT SHORT TERM GOAL #1   Title  The patient will be able to perform HEP mod indep for LE strength (ankle isolated control, hamstring, hip extensors).    Time  4    Period  Weeks    Target Date  08/29/18      PT SHORT TERM GOAL #2   Title  The patient will improve Berg score from 44/56 to > or equal to 48/56 to demo improving balance.    Time  4    Period  Weeks    Target Date  08/29/18      PT SHORT  TERM GOAL #3   Title  The patient will ambulate 30 minutes with PT to observe for postural asymmetry that may be increasing shoulder/neck pain on R side *possible that shorter duration may help improve mechanics-- she may be getting worsening mechanics with fatigue.    Time  4    Period  Weeks    Target Date  08/29/18        PT Long Term Goals - 07/30/18 1351      PT LONG TERM GOAL #1   Title  The patient will negotiate a curb mod indep.    Time  8    Period  Weeks    Target Date  09/28/18      PT LONG TERM GOAL #2   Title  The patient will move floor<>stand with mod indep using L UE support on surface.      PT LONG TERM GOAL #  3   Title  The patient will improve gait speed without device from 1.42 ft/sec to > or equal to 1.8 ft/sec to demo improving balance/stability without a device.    Time  8    Period  Weeks    Target Date  09/28/18             Plan - 07/30/18 1353    Clinical Impression Statement  The patient is a 58 yo female s/p CVA 05/19/2017.  She has had extensive OP rehab and returns today due to pain developing in R shoulder/neck, difficulty negotiating a curb, questions about reducing dependence on AFO, and a desire for updated HEP.  PT to see 1x/week x 8 weeks to address deficits and progress home program.    History and Personal Factors relevant to plan of care:  aphasia, lives with sister/family due to dec'd ability to live indep    Clinical Presentation  Stable    Clinical Presentation due to:  is walking 2+ hours/day (with breaks)    Clinical Decision Making  Low    Rehab Potential  Good    PT Frequency  1x / week   + eval   PT Duration  8 weeks    PT Treatment/Interventions  ADLs/Self Care Home Management;Therapeutic activities;Therapeutic exercise;Neuromuscular re-education;Balance training;Gait training;Stair training;Functional mobility training;Patient/family education;Manual techniques    PT Next Visit Plan  HEP: floor exercises (review prior  routine); hamstring isolated flexion, hip flexion, ankle movement as able to perform without brace for HEP; review home stretching.  Then progress to gait dec'ing UE support and curb negotiation.    Consulted and Agree with Plan of Care  Patient;Family member/caregiver       Patient will benefit from skilled therapeutic intervention in order to improve the following deficits and impairments:  Abnormal gait, Decreased balance, Decreased strength, Postural dysfunction, Impaired flexibility, Pain  Visit Diagnosis: Unsteadiness on feet  Muscle weakness (generalized)  Other symptoms and signs involving the nervous system  Other abnormalities of gait and mobility     Problem List Patient Active Problem List   Diagnosis Date Noted  . Difficulty coping with disease 03/31/2018  . Hemiparesis of right dominant side as late effect of cerebral infarction ([place]) 09/17/2017  . Cardioembolic stroke ([place]) 54/65/0354  . 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 Cardiomyopathy ([place]) 09/17/2017  . H/O ischemic left MCA stroke 09/17/2017  . Cardiac LV ejection fraction 21-30%   . Graves disease   . Plaque psoriasis   . Neuropathy 05/19/2017    Asiya Cutbirth, PT 07/30/2018, 3:15 PM  [facility] [address] [place], [place], [address] Phone: [phone]   Fax:  [phone]  Name: Shaila Gilchrest MRN: [id number] Date of Birth: [birth year]

## 2018-07-30 NOTE — Patient Instructions (Addendum)
Vocational Rehabilitation [email or web address]  [facility] is on February 18 at 4:30. It's on [employer] to advise considering neuropsychology or counseling

## 2018-07-31 ENCOUNTER — Other Ambulatory Visit: Payer: Self-pay

## 2018-07-31 ENCOUNTER — Telehealth: Payer: Self-pay | Admitting: Neurology

## 2018-07-31 ENCOUNTER — Encounter: Payer: Self-pay | Admitting: Speech Pathology

## 2018-07-31 NOTE — Therapy (Signed)
Margaret Ortiz [address] [place], [place], [address] Phone: [phone]   Fax:  [phone]  Speech Language Pathology Evaluation  Patient Details  Name: Margaret Ortiz MRN: [id number] Date of Birth: 1961-04-17 Referring Provider (SLP): Dr. Tomi Likens   Encounter Date: 07/30/2018  End of Session - 07/31/18 1217    Visit Number  1    Number of Visits  5    Date for SLP Re-Evaluation  09/28/18    Authorization Type  BCBS -authorization pending    SLP Start Time  0933    SLP Stop Time   1015    SLP Time Calculation (min)  42 min    Activity Tolerance  Other (comment)   treatment limited 2/2 lability      Past Medical History:  Diagnosis Date  . Aphasia 09/17/2017  . 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  . Cardiac LV ejection fraction 21-30%   . Cardioembolic stroke ([facility]) 09/38/1829   Right hemiparesis  . Cardiomyopathy ([place]) 04/2017  . Dysphagia 05/19/2017   Last known diet upgraded to regular diet with thin liquids on 06/27/2017, no records available  . 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 Fall 09/17/2017  . Graves disease   . H/O ischemic left MCA stroke 09/17/2017  . Hemiparesis of right dominant side as late effect of cerebral infarction ([place]) 09/17/2017  . Hyperlipidemia   . Hypertension   . Neuropathy 05/19/2017   s/p stroke  . Plaque psoriasis   . Shoulder subluxation, right, initial encounter 09/17/2017  . Weakness 09/17/2017    Past Surgical History:  Procedure Laterality Date  . NO PAST SURGERIES      There were no vitals filed for this visit.  Subjective Assessment - 07/31/18 0940    Subjective  "It's very slow when I'm sleeping" (pt means speaking)    Patient is accompained by:  Family member   Sister [place] and Brother in Sports coach Remo Lipps   Currently in Pain?  Yes    Pain Score  7     Pain  Location  --   shoulder/neck   Pain Orientation  Right    Pain Descriptors / Indicators  Aching;Sore    Pain Type  Acute pain    Pain Onset  1 to 4 weeks ago    Pain Frequency  Constant         SLP Evaluation [facility] - 07/31/18 0940      SLP Visit Information   SLP Received On  07/30/18    Referring Provider (SLP)  Dr. Tomi Likens    Onset Date  05/19/2017    Medical Diagnosis  L MCA CVA      Subjective   Patient/Family Stated Goal  "Speak normal"      General Information   HPI  Margaret Ortiz is a 58 y.o. female who had Lt MCA CVA 05/19/2017, with resulting right hemiparesis, aphasia, cognitive deficits, dysarthria, and dysphagia (tolerating regular/thin since Jan 2019)., hx Graves dz. She had therapy in IP rehab [place], [place] and home health [place], [place], then extended course of OP SLP, PT and OT. Known to this therpist from prior course of therapy.     Behavioral/Cognition  alert/cooperative    Mobility Status  see PT eval      Balance Screen   Has the patient fallen in the past 6 months  Yes  How many times?  1      Prior Functional Status   Cognitive/Linguistic Baseline  Baseline deficits    Baseline deficit details  moderate aphasia, dysarthria, cognitive deficits    Type of Home  House     Lives With  Other (Comment)   sister, Brother-in-law   Available Support  Family    Vocation  On disability      Cognition   Overall Cognitive Status  History of cognitive impairments - at baseline    Area of Impairment  Safety/judgement;Awareness    Safety/Judgement  Decreased awareness of safety;Decreased awareness of deficits    Safety and Judgement Comments  Despite extensive prior therapy and education re: deficits, pt continues to perseverate on "when my speech becomes normal," (re: dysarthria) Poor awareness of language and cognitive deficits, poor follow-through with prior recommendations from SLP.    Awareness  Intellectual;Emergent;Anticipatory   of cognitive and  language deficits     Auditory Comprehension   Overall Auditory Comprehension  Other (comment)   not formally assessed.    Conversation  Moderately complex    Overall Auditory Comprehension Comments  pt demonstrates decreased acceptance of education from SLP; this appears due to personality and awareness/insight vs auditory comprehension      Visual Recognition/Discrimination   Discrimination  Not tested      Reading Comprehension   Reading Status  Not tested      Expression   Primary Mode of Expression  Verbal      Verbal Expression   Overall Verbal Expression  Impaired at baseline    Initiation  No impairment    Automatic Speech  Name;Social Response    Level of Generative/Spontaneous Verbalization  Conversation    Interfering Components  Premorbid deficit      Written Expression   Dominant Hand  --   right hand dominant prior to CVA   Written Expression  Not tested      Oral Motor/Sensory Function   Overall Oral Motor/Sensory Function  Impaired at baseline    Labial ROM  Reduced right    Labial Symmetry  Abnormal symmetry right    Labial Strength  Reduced Right    Lingual ROM  Within Functional Limits      Motor Speech   Overall Motor Speech  Impaired at baseline    Respiration  Impaired    Level of Impairment  Sentence    Phonation  Low vocal intensity    Resonance  Within functional limits    Articulation  Impaired    Level of Impairment  Phrase    Intelligibility  Intelligibility reduced    Word  75-100% accurate    Phrase  75-100% accurate    Sentence  75-100% accurate    Conversation  75-100% accurate    Motor Speech Errors  Unaware;Aware    Interfering Components  --   [place] accent, aphasia   Effective Techniques  Slow rate;Over-articulate;Pause    Phonation  Impaired    Volume  Soft      Standardized Assessments   Standardized Assessments   Other Assessment    Other Assessment  Standardized testing not initiated this date as pt was labile for  approximately 60% of the session. SLP inquired re: depression and pt denies, however sister reports poor engagement at home and that "she never speaks" and is withdrawn. SLP reinforced recommendation for neuropsychology as pt's awareness and acceptance of her deficits was a limiting factor during prior course of therapy. Also suggested  seeking counseling as an alternative. Pt expressed resistance to this idea, perseverating on doing more therapy so that she can "speak normal." Also repeatedly asking for physical exercises despite multiple redirections that this would be addressed with PT/OT. Pt expressed that she feels her language skills and cognition are "normal" but states she doesn't speak much because she is bothered by the way her speech is "slow." Pt is >95% intelligible to this familiar listener, with dysarthria evident (low vocal intensity, hoarse/weak vocal quality, impaired articulation due to right CN VII deficits). Intelligibility is also impacted by pt's accent ([place] is first language) and phonemic paraphasias / ? apraxic errors. Pt intermittently aware of paraphasias (eg "sleeping" vs "speaking").                       SLP Education - 07/31/18 1215    Education Details  Timeline of stroke recovery, unlikely that pt's speech will return to her perception of "normal", handout provided with vocational rehab information, [facility], neuropsych recommendation    Person(s) Educated  Patient;Caregiver(s)   sister and brother-in-law   Methods  Explanation;Handout    Comprehension  Other (comment)   pt with reduced acceptance of education re: deficits/neuropsych recommendation        SLP Long Term Goals - 07/31/18 1241      SLP LONG TERM GOAL #1   Title  Pt will use compensations for dysarthria in 5 minute conversation x 3 visits with occasional min A.    Time  4    Period  Weeks    Status  New      SLP LONG TERM GOAL #2   Title  Pt/caregiver will report  pt participation in maintenance routine for communication/cognitive skills x 2 sessions.    Time  4    Period  Weeks    Status  New      SLP LONG TERM GOAL #3   Title  Pt will report contacting at least one community resource ([facility], vocational rehabilitation, counseling or neuropsych)    Time  4    Period  Weeks    Status  New       Plan - 07/31/18 1218    Clinical Impression Statement  Pt presents today with ongoing chronic aphasia, cognitive impairments, and dysarthria resulting from L MCA CVA in November 2018. Pt repeatedly expresses a desire to "speak normal," despite extensive education re: prognosis during prior course of therapy and during today's evaluation. Standardized cognition and language assessments not completed today due to pt's lability throughout the session. At time of d/c from [facility], pt given education re: home program, family educated in strategies to support pt's communication at home, and follow-up community resources provided. Pt has not followed through with these recommendations. Sister reports she is concerned for depression, however pt emphatically denies this. SLP educated pt/family that returning to speech therapy is unlikely to help her speech unless she is willing to accept use of compensatory strategies/techniques. I recommend 4 sessions of skilled ST to focus on compensatory strategies, and to provide education re: community resources and way to support pt's communication/cognition at home.    Speech Therapy Frequency  1x /week    Duration  4 weeks    Treatment/Interventions  Language facilitation;Compensatory techniques;SLP instruction and feedback;Patient/family education;Multimodal communcation approach;Functional tasks    Potential to Achieve Goals  Fair    Potential Considerations  Ability to learn/carryover information;Cooperation/participation level;Previous level of  function    SLP Home Exercise Plan  community resources provided     Consulted and Agree with Plan of Care  Patient;Family member/caregiver       Patient will benefit from skilled therapeutic intervention in order to improve the following deficits and impairments:   Cognitive communication deficit  Aphasia  Dysarthria and anarthria    Problem List Patient Active Problem List   Diagnosis Date Noted  . Difficulty coping with disease 03/31/2018  . Hemiparesis of right dominant side as late effect of cerebral infarction ([place]) 09/17/2017  . Cardioembolic stroke ([facility]) 18/56/3149  . 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 Cardiomyopathy ([facility]) 09/17/2017  . H/O ischemic left MCA stroke 09/17/2017  . Cardiac LV ejection fraction 21-30%   . Graves disease   . Plaque psoriasis   . Neuropathy 05/19/2017   Deneise Lever, [place], [place] 07/31/2018, 12:47 PM  [facility] [address] [place], [place], [address] Phone: [phone]   Fax:  [phone]  Name: Margaret Ortiz MRN: [id number] Date of Birth: 05/02/1961

## 2018-07-31 NOTE — Telephone Encounter (Signed)
Patient is calling in about a referral for PT with Dr.Kristin at phone number: [phone] she did not know the location. Please send this referral Thanks.

## 2018-08-03 ENCOUNTER — Ambulatory Visit: Payer: BLUE CROSS/BLUE SHIELD | Admitting: Sports Medicine

## 2018-08-04 ENCOUNTER — Encounter: Payer: Self-pay | Admitting: Speech Pathology

## 2018-08-04 ENCOUNTER — Ambulatory Visit: Payer: BLUE CROSS/BLUE SHIELD | Admitting: Speech Pathology

## 2018-08-04 ENCOUNTER — Ambulatory Visit: Payer: BLUE CROSS/BLUE SHIELD | Admitting: Occupational Therapy

## 2018-08-04 DIAGNOSIS — M6281 Muscle weakness (generalized): Secondary | ICD-10-CM

## 2018-08-04 DIAGNOSIS — R41841 Cognitive communication deficit: Secondary | ICD-10-CM

## 2018-08-04 DIAGNOSIS — I69318 Other symptoms and signs involving cognitive functions following cerebral infarction: Secondary | ICD-10-CM

## 2018-08-04 DIAGNOSIS — R4701 Aphasia: Secondary | ICD-10-CM

## 2018-08-04 DIAGNOSIS — I69351 Hemiplegia and hemiparesis following cerebral infarction affecting right dominant side: Secondary | ICD-10-CM

## 2018-08-04 DIAGNOSIS — G8929 Other chronic pain: Secondary | ICD-10-CM

## 2018-08-04 DIAGNOSIS — R29818 Other symptoms and signs involving the nervous system: Secondary | ICD-10-CM

## 2018-08-04 DIAGNOSIS — R208 Other disturbances of skin sensation: Secondary | ICD-10-CM

## 2018-08-04 DIAGNOSIS — M25511 Pain in right shoulder: Secondary | ICD-10-CM

## 2018-08-04 NOTE — Therapy (Signed)
[facility] [address] [place], [place], [address] Phone: [phone]   Fax:  [phone]  Occupational Therapy Treatment  Patient Details  Name: Margaret Ortiz MRN: [id number] Date of Birth: 07-30-1960 Referring Provider (OT): Dr. Metta Clines   Encounter Date: 08/04/2018  OT End of Session - 08/04/18 1710    Visit Number  2    Number of Visits  12    Date for OT Re-Evaluation  09/28/18    Authorization Type  BCBS--awaiting insurance authorization    OT Start Time  1105    OT Stop Time  1143    OT Time Calculation (min)  38 min    Activity Tolerance  Patient tolerated treatment well    Behavior During Therapy  [facility] for tasks assessed/performed       Past Medical History:  Diagnosis Date  . Aphasia 09/17/2017  . 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  . Cardiac LV ejection fraction 21-30%   . Cardioembolic stroke ([facility]) 09/62/8366   Right hemiparesis  . Cardiomyopathy ([facility] [facility]) 04/2017  . Dysphagia 05/19/2017   Last known diet upgraded to regular diet with thin liquids on 06/27/2017, no records available  . 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 Fall 09/17/2017  . Graves disease   . H/O ischemic left MCA stroke 09/17/2017  . Hemiparesis of right dominant side as late effect of cerebral infarction ([facility]) 09/17/2017  . Hyperlipidemia   . Hypertension   . Neuropathy 05/19/2017   s/p stroke  . Plaque psoriasis   . Shoulder subluxation, right, initial encounter 09/17/2017  . Weakness 09/17/2017    Past Surgical History:  Procedure Laterality Date  . NO PAST SURGERIES      There were no vitals filed for this visit.  Subjective Assessment - 08/04/18 1139    Patient Stated Goals  improve hand, improve pain    Currently in Pain?  Yes    Pain Score  3     Pain Location  Shoulder   a little on arrival   Pain  Orientation  Right    Pain Descriptors / Indicators  Aching    Pain Type  Acute pain    Pain Onset  1 to 4 weeks ago    Pain Frequency  Intermittent    Aggravating Factors   increased after walking    Pain Relieving Factors  tylenol                 Treatment: Gentle reach for the floor stretch, then pt was instructed in initial HEP, see pt instructions Therapist initiated recommendations for bed positioning, using towel behind scapula and towel under arm.          OT Education - 08/04/18 1714    Education Details  inital HEP, see pt instructions    Person(s) Educated  Patient;Caregiver(s)    Methods  Explanation    Comprehension  Verbalized understanding;Returned demonstration;Verbal cues required       OT Short Term Goals - 07/30/18 1746      OT SHORT TERM GOAL #1   Title  Pt/family independent with updated HEP for RUE-- check STGs 08/28/18    Time  4    Period  Weeks    Status  New      OT SHORT TERM GOAL #2   Title  Pt will report R shoulder/neck pain less than  or equal to 5/10 for functional tasks.    Time  4    Period  Weeks    Status  New      OT SHORT TERM GOAL #3   Title  Pt will verbalize understanding of proper positioning of RUE to decr pain.    Time  4    Period  Weeks    Status  New      OT SHORT TERM GOAL #4   Title  ---      OT SHORT TERM GOAL #5   Title  --        OT Long Term Goals - 07/30/18 1748      OT LONG TERM GOAL #1   Title  Pt will perform simple home maintenance tasks mod I incorporating RUE as stabilizer or using hand-over-hand techniques when able.--check LTGs 09/28/18    Time  8    Period  Weeks    Status  New      OT LONG TERM GOAL #2   Title  Pt will report R shoulder/neck pain less than or equal to 3/10 for ADLs.    Time  8    Period  Weeks    Status  New      OT LONG TERM GOAL #3   Title  --      OT LONG TERM GOAL #4   Title  --      OT LONG TERM GOAL #5   Title  --            Plan -  08/04/18 1712    Clinical Impression Statement  Pt is progressing towards goals. Pt/ family were educated in inital HEP and ways to incorporate arm functionally.    Occupational Profile and client history currently impacting functional performance  Pt was independent prior to CVA and living in [place], [place].  Pt currently living with sister/brother-in-law and is not participating in household tasks, unable to work, drive, or live alone safely.    Rehab Potential  Fair    OT Frequency  2x / week    OT Duration  4 weeks    OT Treatment/Interventions  Self-care/ADL training;Electrical Stimulation;Therapeutic exercise;Patient/family education;Splinting;Neuromuscular education;Paraffin;Moist Heat;Fluidtherapy;Energy conservation;Therapist, nutritional;Therapeutic activities;Cryotherapy;Ultrasound;DME and/or AE instruction;Manual Therapy;Passive range of motion;Cognitive remediation/compensation;Balance training    Plan  splint check, positioning of RUE with ambulation/ assess sling    Consulted and Agree with Plan of Care  Patient;Family member/caregiver       Patient will benefit from skilled therapeutic intervention in order to improve the following deficits and impairments:  Abnormal gait, Decreased mobility, Difficulty walking, Impaired sensation, Impaired vision/preception, Pain, Improper body mechanics, Impaired perceived functional ability, Decreased range of motion, Decreased knowledge of precautions, Decreased cognition, Decreased coordination, Decreased safety awareness, Decreased strength, Impaired UE functional use, Improper spinal/pelvic alignment  Visit Diagnosis: Muscle weakness (generalized)  Other symptoms and signs involving the nervous system  Hemiplegia and hemiparesis following cerebral infarction affecting right dominant side (HCC)  Other disturbances of skin sensation  Chronic right shoulder pain  Other symptoms and signs involving cognitive functions following  cerebral infarction    Problem List Patient Active Problem List   Diagnosis Date Noted  . Difficulty coping with disease 03/31/2018  . Hemiparesis of right dominant side as late effect of cerebral infarction ([place]) 09/17/2017  . Cardioembolic stroke ([facility]) 65/78/4696  . 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 Cardiomyopathy ([place]) 09/17/2017  . H/O ischemic left MCA stroke  09/17/2017  . Cardiac LV ejection fraction 21-30%   . Graves disease   . Plaque psoriasis   . Neuropathy 05/19/2017    Margaret Ortiz 08/04/2018, 5:15 PM Theone Murdoch, OTR/L [phone] Phone: [phone] 5:21 PM 08/04/18 [facility] [address] [place], [place], [address] Phone: [phone]   Fax:  [phone]  Name: Margaret Ortiz MRN: [id number] Date of Birth: February 06, 1961

## 2018-08-04 NOTE — Patient Instructions (Signed)
Use both hands to wipe tabletop in seated or standing postioning 10 reps forwards and backwards and in a circular motion.  Hold cane on your lap with both hands slide cane forwards along your knees then bring back to your waist, squeeze shoulder blades back, 10 reps  Place both hands on top of your cane, push cane forwards and backwards gently 10 reps   Standing at tabletop push ball forwards and backwards slowly along tabletop 10 reps  Use your right hand to hold toothbrush while putting toothpaste on it, Hod a small bottle on the table while opening with left hand

## 2018-08-04 NOTE — Patient Instructions (Signed)
   Call [facility] and ask what you need to bring, who can go with you, how do you park, how much does it cost  Call transport service and set up a ride - what do I need to do to set up a ride, how far in advance can you make a ride or change a ride,   You need to practice talking to people in the community  You can write down what you want to say before you call - jot down key words  Gym - go and walk in

## 2018-08-04 NOTE — Therapy (Signed)
[facility] [address] [place], [place], [address] Phone: [phone]   Fax:  [phone]  Speech Language Pathology Treatment  Patient Details  Name: Margaret Ortiz MRN: [id number] Date of Birth: Feb 18, 1961 Referring Provider (SLP): Dr. Tomi Likens   Encounter Date: 08/04/2018  End of Session - 08/04/18 1255    Visit Number  2    Number of Visits  5    Date for SLP Re-Evaluation  09/28/18    Authorization Type  BCBS -authorization pending    SLP Start Time  1147    SLP Stop Time   1230    SLP Time Calculation (min)  43 min       Past Medical History:  Diagnosis Date  . Aphasia 09/17/2017  . 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  . Cardiac LV ejection fraction 21-30%   . Cardioembolic stroke ([facility]) 49/70/2637   Right hemiparesis  . Cardiomyopathy ([facility]) 04/2017  . Dysphagia 05/19/2017   Last known diet upgraded to regular diet with thin liquids on 06/27/2017, no records available  . 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 Fall 09/17/2017  . Graves disease   . H/O ischemic left MCA stroke 09/17/2017  . Hemiparesis of right dominant side as late effect of cerebral infarction ([place]) 09/17/2017  . Hyperlipidemia   . Hypertension   . Neuropathy 05/19/2017   s/p stroke  . Plaque psoriasis   . Shoulder subluxation, right, initial encounter 09/17/2017  . Weakness 09/17/2017    Past Surgical History:  Procedure Laterality Date  . NO PAST SURGERIES      There were no vitals filed for this visit.         ADULT SLP TREATMENT - 08/04/18 1154      General Information   Behavior/Cognition  Alert;Cooperative;Pleasant mood      Treatment Provided   Treatment provided  Cognitive-Linquistic      Cognitive-Linquistic Treatment   Treatment focused on  Aphasia    Skilled Treatment  Facilitated pt identification  of 3 goals she is to work on this week, including caling the aphasia group; setting up transportation  service for herself, and inquiring about a gym membership. Trained pt in scripting as a strategy to make a business phone call. Pt has looked up transportation online, but has not followed up. Pt required usual mod verbal and questoining cues to generate questions she would want to ask /infomation she would need to know, when she contacts these 3 community services. Pt required re-direction from perseveration, "If my other hand worked, it would all be fine" Aphasic errors in conversation required mod cues to ID for awareness of errors. Demonstrated how to add text to speech on her phone, as pt did not bring her phone in.       Assessment / Recommendations / Plan   Plan  Continue with current plan of care      Progression Toward Goals   Progression toward goals  Progressing toward goals       SLP Education - 08/04/18 1250    Education Details  aphasia group; scripting, voc rehab; talk to others in the community to practice          SLP Long Term Goals - 08/04/18 [place] #1   Title  Pt will use compensations for dysarthria in 5 minute conversation  x 3 visits with occasional min A.    Time  4    Period  Weeks    Status  On-going      SLP LONG TERM GOAL #2   Title  Pt/caregiver will report pt participation in maintenance routine for communication/cognitive skills x 2 sessions.    Time  4    Period  Weeks    Status  On-going      SLP LONG TERM GOAL #3   Title  Pt will report contacting at least one community resource ([facility], vocational rehabilitation, counseling or neuropsych)    Time  4    Period  Weeks    Status  On-going       Plan - 08/04/18 1251    Clinical Impression Statement  Chronic aphasia persists. She has not yet called voc rehab or aphasia group. She continues to require ongoing cues for awareness of aphasic errors . Initiated  training of compensations for aphasia to make these phone calls. Continue skilled ST to maximize carryover of compensations and follow up with community resources.        Patient will benefit from skilled therapeutic intervention in order to improve the following deficits and impairments:   Aphasia  Cognitive communication deficit    Problem List Patient Active Problem List   Diagnosis Date Noted  . Difficulty coping with disease 03/31/2018  . Hemiparesis of right dominant side as late effect of cerebral infarction ([facility]) 09/17/2017  . Cardioembolic stroke ([facility]) 47/65/4650  . 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 Cardiomyopathy ([place]) 09/17/2017  . H/O ischemic left MCA stroke 09/17/2017  . Cardiac LV ejection fraction 21-30%   . Graves disease   . Plaque psoriasis   . Neuropathy 05/19/2017    [place], Annye Rusk MS, CCC-SLP 08/04/2018, 12:56 PM  [facility] [address] [place], [place], [address] Phone: [phone]   Fax:  [phone]   Name: Margaret Ortiz MRN: [id number] Date of Birth: 1960-07-01

## 2018-08-07 ENCOUNTER — Encounter

## 2018-08-12 ENCOUNTER — Other Ambulatory Visit: Payer: Self-pay

## 2018-08-12 DIAGNOSIS — I69351 Hemiplegia and hemiparesis following cerebral infarction affecting right dominant side: Secondary | ICD-10-CM

## 2018-08-13 ENCOUNTER — Other Ambulatory Visit: Payer: Self-pay

## 2018-08-13 ENCOUNTER — Ambulatory Visit: Payer: BLUE CROSS/BLUE SHIELD

## 2018-08-13 DIAGNOSIS — R471 Dysarthria and anarthria: Secondary | ICD-10-CM

## 2018-08-13 DIAGNOSIS — R41841 Cognitive communication deficit: Secondary | ICD-10-CM

## 2018-08-13 DIAGNOSIS — R4701 Aphasia: Secondary | ICD-10-CM

## 2018-08-13 DIAGNOSIS — I69351 Hemiplegia and hemiparesis following cerebral infarction affecting right dominant side: Secondary | ICD-10-CM | POA: Diagnosis not present

## 2018-08-13 MED ORDER — GABAPENTIN 300 MG PO CAPS: 300.0000 mg | ORAL_CAPSULE | ORAL | 3 refills | Status: DC

## 2018-08-13 NOTE — Therapy (Signed)
[facility] [address] [place], [place], [address] Phone: [phone]   Fax:  [phone]  Speech Language Pathology Treatment  Patient Details  Name: Bentlie Withem MRN: [id number] Date of Birth: [birth year] Referring Provider (SLP): Dr. Tomi Likens   Encounter Date: 08/13/2018  End of Session - 08/13/18 2258    Visit Number  3    Number of Visits  5    Date for SLP Re-Evaluation  09/28/18    SLP Start Time  1319    SLP Stop Time   1401    SLP Time Calculation (min)  42 min    Activity Tolerance  Patient tolerated treatment well       Past Medical History:  Diagnosis Date  . Aphasia 09/17/2017  . 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  . Cardiac LV ejection fraction 21-30%   . Cardioembolic stroke ([place]) 94/76/5465   Right hemiparesis  . Cardiomyopathy ([place]) 04/2017  . Dysphagia 05/19/2017   Last known diet upgraded to regular diet with thin liquids on 06/27/2017, no records available  . 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 Fall 09/17/2017  . Graves disease   . H/O ischemic left MCA stroke 09/17/2017  . Hemiparesis of right dominant side as late effect of cerebral infarction ([place]) 09/17/2017  . Hyperlipidemia   . Hypertension   . Neuropathy 05/19/2017   s/p stroke  . Plaque psoriasis   . Shoulder subluxation, right, initial encounter 09/17/2017  . Weakness 09/17/2017    Past Surgical History:  Procedure Laterality Date  . NO PAST SURGERIES      There were no vitals filed for this visit.  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 "I can write," when SLP suggested typing and other work on a computer would be much less efficient than would be necessary for a researcher, given her R UE impairment. SLP provided pt with paper and pencil to write - pt with illegible handwriting and aphasic errors seen. SLP told pt his opinon that the chances of her returning to a lead role were likely not going to occur. SLP graphed prognosis for return to more WNL language skills declines as time post CVA increases. Pt stated, "I don't believe it." Pt reported contacting [facility] and transportation, who brought pt today. Pt, to SLP's knowledge, did not contact vocational rehab. 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 Pt's langauge with this topic was relatively better than other topics.       Assessment / Recommendations /  Plan   Plan  Continue with current plan of care      Progression Toward Goals   Progression toward goals  Progressing toward goals       SLP Education - 08/13/18 2256    Education Details  prognosis, now, for recovering verbal skills diminishes more and more as time post-onset continues, pt could likely not lead a team of researchers or perform her job adequately to return to work    [employer]) Educated  Patient    Methods  Explanation;Demonstration    Comprehension  Need further instruction;Verbal cues required         SLP Long Term Goals - 08/13/18 1322      SLP LONG TERM GOAL  #1   Title  Pt will use compensations for dysarthria in 5 minute conversation x 3 visits with occasional min A.    Time  3    Period  Weeks    Status  On-going      SLP LONG TERM GOAL #2   Title  Pt/caregiver will report pt participation in maintenance routine for communication/cognitive skills x 2 sessions.    Time  3    Period  Weeks    Status  On-going      SLP LONG TERM GOAL #3   Title  Pt will report contacting at least one community resource ([place], vocational rehabilitation, counseling or neuropsych)    [place] - initial contact voice to voice and follow up with email    Time  3    Period  Weeks    Status  Achieved       Plan - 08/13/18 2259    Clinical Impression Statement  Chronic aphasia persists. She has not yet called voc rehab and attempted to tell SLP she could complete all her previous job duties as premorbidly, despite SLP direct disagreement with pt. She continues to require ongoing cues for awareness of aphasic errors. Pt using description spontaneously in conversation today. Continue skilled ST to maximize carryover of compensations and follow up with community resources.     Speech Therapy Frequency  1x /week    Duration  4 weeks    Treatment/Interventions  Language facilitation;Compensatory techniques;SLP instruction and feedback;Patient/family education;Multimodal communcation approach;Functional tasks    Potential to Achieve Goals  Fair    Potential Considerations  Ability to learn/carryover information;Cooperation/participation level;Previous level of function       Patient will benefit from skilled therapeutic intervention in order to improve the following deficits and impairments:   Aphasia  Cognitive communication deficit  Dysarthria and anarthria    Problem List Patient Active Problem List   Diagnosis Date Noted  . Difficulty coping with disease 03/31/2018  . Hemiparesis of right dominant side as  late effect of cerebral infarction ([facility]) 09/17/2017  . Cardioembolic stroke ([place]) 36/62/9476  . 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 Cardiomyopathy ([place]) 09/17/2017  . H/O ischemic left MCA stroke 09/17/2017  . Cardiac LV ejection fraction 21-30%   . Graves disease   . Plaque psoriasis   . Neuropathy 05/19/2017    [facility] ,MS, CCC-SLP  08/13/2018, 11:01 PM  [facility] [address] [place], [place], [address] Phone: [phone]   Fax:  [phone]   Name: Brynlea Spindler MRN: [id number] Date of Birth: 11-Aug-1960

## 2018-08-13 NOTE — Patient Instructions (Signed)
   Talk[facility] Therapy - computer-based language therapy tasks

## 2018-08-14 ENCOUNTER — Encounter

## 2018-08-15 ENCOUNTER — Other Ambulatory Visit: Payer: Self-pay | Admitting: Interventional Cardiology

## 2018-08-18 ENCOUNTER — Encounter: Payer: Self-pay | Admitting: Physical Medicine & Rehabilitation

## 2018-08-18 ENCOUNTER — Ambulatory Visit: Payer: BLUE CROSS/BLUE SHIELD | Admitting: Speech Pathology

## 2018-08-18 DIAGNOSIS — R471 Dysarthria and anarthria: Secondary | ICD-10-CM

## 2018-08-18 DIAGNOSIS — R4701 Aphasia: Secondary | ICD-10-CM

## 2018-08-18 DIAGNOSIS — R41841 Cognitive communication deficit: Secondary | ICD-10-CM

## 2018-08-18 DIAGNOSIS — I69351 Hemiplegia and hemiparesis following cerebral infarction affecting right dominant side: Secondary | ICD-10-CM | POA: Diagnosis not present

## 2018-08-18 NOTE — Therapy (Signed)
[facility] [address] [place], [place], [address] Phone: [phone]   Fax:  [phone]  Speech Language Pathology Treatment  Patient Details  Name: Margaret Ortiz MRN: [id number] Date of Birth: 1960/12/03 Referring Provider (SLP): Dr. Tomi Likens   Encounter Date: 08/18/2018  End of Session - 08/18/18 1355    Visit Number  4    Number of Visits  5    Date for SLP Re-Evaluation  09/28/18    Authorization Type  BCBS -authorization pending    SLP Start Time  1254    SLP Stop Time   1340    SLP Time Calculation (min)  46 min    Activity Tolerance  Patient tolerated treatment well       Past Medical History:  Diagnosis Date  . Aphasia 09/17/2017  . 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  . Cardiac LV ejection fraction 21-30%   . Cardioembolic stroke ([place]) 03/47/4259   Right hemiparesis  . Cardiomyopathy ([place]) 04/2017  . Dysphagia 05/19/2017   Last known diet upgraded to regular diet with thin liquids on 06/27/2017, no records available  . 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 Fall 09/17/2017  . Graves disease   . H/O ischemic left MCA stroke 09/17/2017  . Hemiparesis of right dominant side as late effect of cerebral infarction ([place]) 09/17/2017  . Hyperlipidemia   . Hypertension   . Neuropathy 05/19/2017   s/p stroke  . Plaque psoriasis   . Shoulder subluxation, right, initial encounter 09/17/2017  . Weakness 09/17/2017    Past Surgical History:  Procedure Laterality Date  . NO PAST SURGERIES      There were no vitals filed for this visit.  Subjective Assessment - 08/18/18 1255    Subjective  "[place]th" (pt planning to go to [place] next month)  (Pended)     Patient is accompained by:  Family member  (Pended)     Currently in Pain?  No/denies  (Pended)             ADULT SLP TREATMENT -  08/18/18 1254      General Information   Behavior/Cognition  Alert;Pleasant mood;Cooperative      Treatment Provided   Treatment provided  Cognitive-Linquistic      Pain Assessment   Pain Assessment  No/denies pain      Cognitive-Linquistic Treatment   Treatment focused on  Aphasia;Apraxia;Dysarthria    Skilled Treatment  SLP worked with pt on compensatory strategies for aphasia/motor speech and error awareness in simple conversation and by having pt read cardiac terminology. SLP used written feedback, writing pt's responses phonetically to promote awareness of paraphasias. Pt reattempting incorrect productions ~50% of the time, typically unaware of correct vs incorrect reattempts. With written feedback and cues for segmenting multisyllabic words, pt successfully corrected errors (occasional mod A). SLP encouraged pt to practice this word list at home while taking a video on her cell phone to promote error awareness. Pt told SLP she wanted to have an echocardiogram when she sees her cardiologist next month. SLP noted pt's communication via [place] in [place] with vague/paraphasic request. SLP gave pt paper and pencil to practice a draft of request to MD explaining her reason for wanting an echocardiogram. Illegible handwriting/aphasic errors seen. Pt then logged into MyChart and created draft message re: her request. Pt wrote only: "I want to know EF%."  She required mod-max cues to add context to her email, including her request to have an echocardiogram at her next appointment, and to respond to RN question about her symptoms. Usual mod A for awareness of spelling/grammatical errors. When pt had difficulty with wordfinding, she told SLP she couldn't think of the word because she didn't get much sleep the night before.       Assessment / Recommendations / Plan   Plan  Continue with current plan of care      Progression Toward Goals   Progression toward goals  Progressing toward goals       SLP  Education - 08/18/18 1355    Education Details  Record speech to help with error awareness    Person(s) Educated  Patient    Methods  Explanation    Comprehension  Verbalized understanding         SLP Long Term Goals - 08/18/18 1341      SLP LONG TERM GOAL #1   Title  Pt will use compensations for dysarthria in 5 minute conversation x 3 visits with occasional min A.    Time  2    Period  Weeks    Status  On-going      SLP LONG TERM GOAL #2   Title  Pt/caregiver will report pt participation in maintenance routine for communication/cognitive skills x 2 sessions.    Time  3    Period  Weeks    Status  On-going      SLP LONG TERM GOAL #3   Title  Pt will report contacting at least one community resource ([place], vocational rehabilitation, counseling or neuropsych)    [place] - initial contact voice to voice and follow up with email    Time  3    Period  Weeks    Status  Achieved       Plan - 08/18/18 1356    Clinical Impression Statement  Chronic aphasia persists. She has not yet called voc rehab; expressed disinterest today in doing so.  She continues to require ongoing cues for awareness of aphasic errors. Pt using description spontaneously in conversation today. Continue skilled ST to maximize carryover of compensations and follow up with community resources.     Speech Therapy Frequency  1x /week    Duration  4 weeks    Treatment/Interventions  Language facilitation;Compensatory techniques;SLP instruction and feedback;Patient/family education;Multimodal communcation approach;Functional tasks    Potential to Achieve Goals  Fair    Potential Considerations  Ability to learn/carryover information;Cooperation/participation level;Previous level of function       Patient will benefit from skilled therapeutic intervention in order to improve the following deficits and impairments:   Aphasia  Dysarthria and anarthria  Cognitive  communication deficit    Problem List Patient Active Problem List   Diagnosis Date Noted  . Difficulty coping with disease 03/31/2018  . Hemiparesis of right dominant side as late effect of cerebral infarction ([place]) 09/17/2017  . Cardioembolic stroke ([place]) 71/24/5809  . 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 Cardiomyopathy ([place]) 09/17/2017  . H/O ischemic left MCA stroke 09/17/2017  . Cardiac LV ejection fraction 21-30%   . Graves disease   . Plaque psoriasis   . Neuropathy 05/19/2017   Deneise Lever, [place], [place] 08/18/2018, 1:57 PM  [facility] [address] [place], [place],  [address] Phone: [phone]   Fax:  [phone]   Name: Margaret Ortiz MRN: [id number] Date of Birth: 05/12/1961

## 2018-08-18 NOTE — Patient Instructions (Signed)
Practice your speech: you can record a video of yourself on your phone while you say the cardiac terms I gave you. Play it back and watch yourself. If you made mistakes, try those words again. SLOW. Try breaking them down into pieces like we did in therapy.

## 2018-08-20 ENCOUNTER — Ambulatory Visit: Payer: BLUE CROSS/BLUE SHIELD | Admitting: Rehabilitative and Restorative Service Providers"

## 2018-08-20 DIAGNOSIS — R29818 Other symptoms and signs involving the nervous system: Secondary | ICD-10-CM

## 2018-08-20 DIAGNOSIS — M6281 Muscle weakness (generalized): Secondary | ICD-10-CM

## 2018-08-20 DIAGNOSIS — R2689 Other abnormalities of gait and mobility: Secondary | ICD-10-CM

## 2018-08-20 DIAGNOSIS — I69351 Hemiplegia and hemiparesis following cerebral infarction affecting right dominant side: Secondary | ICD-10-CM | POA: Diagnosis not present

## 2018-08-20 DIAGNOSIS — R2681 Unsteadiness on feet: Secondary | ICD-10-CM

## 2018-08-20 NOTE — Therapy (Signed)
[facility] [address] [name], [place], [address] Phone: [phone]   Fax:  [phone]  Physical Therapy Treatment  Patient Details  Name: Margaret Ortiz MRN: [id number] Date of Birth: Nov 23, 1960 Referring Provider (PT): Metta Clines, DO   Encounter Date: 08/20/2018  PT End of Session - 08/20/18 2115    Visit Number  2    Number of Visits  8    Date for PT Re-Evaluation  09/28/18    Authorization Type  BCBS    PT Start Time  1327    PT Stop Time  1406    PT Time Calculation (min)  39 min    Activity Tolerance  Patient tolerated treatment well    Behavior During Therapy  [facility] for tasks assessed/performed       Past Medical History:  Diagnosis Date  . Aphasia 09/17/2017  . 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  . Cardiac LV ejection fraction 21-30%   . Cardioembolic stroke ([place]) 04/20/2535   Right hemiparesis  . Cardiomyopathy ([place]) 04/2017  . Dysphagia 05/19/2017   Last known diet upgraded to regular diet with thin liquids on 06/27/2017, no records available  . 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 Fall 09/17/2017  . Graves disease   . H/O ischemic left MCA stroke 09/17/2017  . Hemiparesis of right dominant side as late effect of cerebral infarction ([place]) 09/17/2017  . Hyperlipidemia   . Hypertension   . Neuropathy 05/19/2017   s/p stroke  . Plaque psoriasis   . Shoulder subluxation, right, initial encounter 09/17/2017  . Weakness 09/17/2017    Past Surgical History:  Procedure Laterality Date  . NO PAST SURGERIES      There were no vitals filed for this visit.  Subjective Assessment - 08/20/18 1327    Subjective  The patient reports her ride was late today.   She notes gabapentin is helping the pain in her shoulder an dneck.    Patient Stated Goals  get rid of brace, use exercise equipment.   She wants to do floor exercises.    Currently in Pain?  No/denies    Pain Score  0-No pain   none at rest, worse with movement                      [facility] Adult PT Treatment/Exercise - 08/20/18 1354      Ambulation/Gait   Ambulation/Gait  Yes    Ambulation/Gait Assistance  6: Modified independent (Device/Increase time)    Ambulation Distance (Feet)  300 Feet    Assistive device  Straight cane    Ambulation Surface  Level;Indoor    Curb  5: Supervision   to CGA for safety + use of single point cane   Curb Details (indicate cue type and reason)  PT initially had patient practice on 4" box, then on stairs and then on 7" curb with close supervision to [place] for safety.    Gait Comments  PT provided tactile and verbal cues to decrease speed and focus on equal step length and R knee flexion.      Therapeutic Activites    Therapeutic Activities  Other Therapeutic Activities    Other Therapeutic Activities  Floor transfers working on technique for getting down and back up with L UE support on solid surface.   Performed x 2 reps requiring >8 minutes  each rep to allow patient time to problem solve how to transition between movements.      Neuro Re-ed    Neuro Re-ed Details   Tall kneeling moving L LE into/out of abduction/extension to load R LE for weight shifting with tactile cues for trunk upright and elongation.  Performed heel sitting to tall kneeling and maintaining balance in 1/2 kneeling working on hip stability and trunk control.  Performed standing moving L LE to 4" step to initiate curb practice.  Patient required CGA to supervision for safety.             PT Education - 08/20/18 2114    Education Details  instructions on safest way to perform floor<>stand transfer (see theract)    Person(s) Educated  Patient    Methods  Explanation;Demonstration;Verbal cues    Comprehension  Verbalized understanding;Returned demonstration;Need further instruction;Tactile cues  required       PT Short Term Goals - 07/30/18 1350      PT SHORT TERM GOAL #1   Title  The patient will be able to perform HEP mod indep for LE strength (ankle isolated control, hamstring, hip extensors).    Time  4    Period  Weeks    Target Date  08/29/18      PT SHORT TERM GOAL #2   Title  The patient will improve Berg score from 44/56 to > or equal to 48/56 to demo improving balance.    Time  4    Period  Weeks    Target Date  08/29/18      PT SHORT TERM GOAL #3   Title  The patient will ambulate 30 minutes with PT to observe for postural asymmetry that may be increasing shoulder/neck pain on R side *possible that shorter duration may help improve mechanics-- she may be getting worsening mechanics with fatigue.    Time  4    Period  Weeks    Target Date  08/29/18        PT Long Term Goals - 07/30/18 1351      PT LONG TERM GOAL #1   Title  The patient will negotiate a curb mod indep.    Time  8    Period  Weeks    Target Date  09/28/18      PT LONG TERM GOAL #2   Title  The patient will move floor<>stand with mod indep using L UE support on surface.      PT LONG TERM GOAL #3   Title  The patient will improve gait speed without device from 1.42 ft/sec to > or equal to 1.8 ft/sec to demo improving balance/stability without a device.    Time  8    Period  Weeks    Target Date  09/28/18            Plan - 08/20/18 2127    Clinical Impression Statement  The patient was able to perform floor transfers today with demonstration and CGA for safety when provided adequate time to problem solve through task.  PT misunderstood patient at eval.  She had discussed doing floor exercises, but it appears she was concerned wit hher ability to get up and down from the floor in case she was home alone and fell.  PT to continue working on this task as repetition will allow for improved carryover and confidence during task.     PT Treatment/Interventions  ADLs/Self Care Home  Management;Therapeutic activities;Therapeutic exercise;Neuromuscular re-education;Balance training;Gait  training;Stair training;Functional mobility training;Patient/family education;Manual techniques    PT Next Visit Plan  Review prior HEP, review floor transfers, curb negotiation, outdoor/extended ambulation.    Consulted and Agree with Plan of Care  Patient;Family member/caregiver       Patient will benefit from skilled therapeutic intervention in order to improve the following deficits and impairments:  Abnormal gait, Decreased balance, Decreased strength, Postural dysfunction, Impaired flexibility, Pain  Visit Diagnosis: Muscle weakness (generalized)  Other symptoms and signs involving the nervous system  Other abnormalities of gait and mobility  Unsteadiness on feet     Problem List Patient Active Problem List   Diagnosis Date Noted  . Difficulty coping with disease 03/31/2018  . Hemiparesis of right dominant side as late effect of cerebral infarction ([place]) 09/17/2017  . Cardioembolic stroke ([place]) 30/94/0768  . 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 Cardiomyopathy ([place]) 09/17/2017  . H/O ischemic left MCA stroke 09/17/2017  . Cardiac LV ejection fraction 21-30%   . Graves disease   . Plaque psoriasis   . Neuropathy 05/19/2017    Clothilde Tippetts, PT 08/20/2018, 9:31 PM  [facility] [address] [place], [place], [address] Phone: [phone]   Fax:  [phone]  Name: Laurita Peron MRN: [id number] Date of Birth: 1961/04/01

## 2018-08-24 ENCOUNTER — Ambulatory Visit (INDEPENDENT_AMBULATORY_CARE_PROVIDER_SITE_OTHER): Payer: BLUE CROSS/BLUE SHIELD | Admitting: Interventional Cardiology

## 2018-08-24 ENCOUNTER — Encounter: Payer: Self-pay | Admitting: Interventional Cardiology

## 2018-08-24 VITALS — BP 90/50 | HR 59 | Ht 62.0 in | Wt 118.8 lb

## 2018-08-24 DIAGNOSIS — I5022 Chronic systolic (congestive) heart failure: Secondary | ICD-10-CM | POA: Diagnosis not present

## 2018-08-24 DIAGNOSIS — I340 Nonrheumatic mitral (valve) insufficiency: Secondary | ICD-10-CM | POA: Diagnosis not present

## 2018-08-24 DIAGNOSIS — I69351 Hemiplegia and hemiparesis following cerebral infarction affecting right dominant side: Secondary | ICD-10-CM

## 2018-08-24 DIAGNOSIS — I639 Cerebral infarction, unspecified: Secondary | ICD-10-CM

## 2018-08-24 DIAGNOSIS — I63412 Cerebral infarction due to embolism of left middle cerebral artery: Secondary | ICD-10-CM

## 2018-08-24 MED ORDER — ATORVASTATIN CALCIUM 80 MG PO TABS: 80.0000 mg | ORAL_TABLET | Freq: Every day | ORAL | 3 refills | Status: DC

## 2018-08-24 MED ORDER — CARVEDILOL 3.125 MG PO TABS: 3.1250 mg | ORAL_TABLET | Freq: Two times a day (BID) | ORAL | 3 refills | Status: DC

## 2018-08-24 MED ORDER — APIXABAN 5 MG PO TABS: 5.0000 mg | ORAL_TABLET | Freq: Two times a day (BID) | ORAL | 3 refills | Status: DC

## 2018-08-24 NOTE — Patient Instructions (Addendum)
Medication Instructions:  Your physician recommends that you continue on your current medications as directed. Please refer to the Current Medication list given to you today.  If you need a refill on your cardiac medications before your next appointment, please call your pharmacy.   Lab work: None Ordered  If you have labs (blood work) drawn today and your tests are completely normal, you will receive your results only by: [facility] MyChart Message (if you have MyChart) OR . A paper copy in the mail If you have any lab test that is abnormal or we need to change your treatment, we will call you to review the results.  Testing/Procedures: None ordered  Follow-Up: At [facility], you and your health needs are our priority.  As part of our continuing mission to provide you with exceptional heart care, we have created designated Provider Care Teams.  These Care Teams include your primary Cardiologist (physician) and Advanced Practice Providers (APPs -  Physician Assistants and Nurse Practitioners) who all work together to provide you with the care you need, when you need it. . You will need a follow up appointment in October 2020.  Please call our office 2 months in advance to schedule this appointment.  You may see Casandra Doffing, MD or one of the following Advanced Practice Providers on your designated Care Team:   . Lyda Jester, PA-C . Dayna Dunn, PA-C . Ermalinda Barrios, PA-C  Any Other Special Instructions Will Be Listed Below (If Applicable).

## 2018-08-24 NOTE — Progress Notes (Signed)
Cardiology Office Note   Date:  08/24/2018   ID:  Margaret, Ortiz 1960-08-18, MRN [id number]  PCP:  Howard Pouch A, DO    No chief complaint on file.    Wt Readings from Last 3 Encounters:  08/24/18 118 lb 12.8 oz (53.9 kg)  06/22/18 125 lb (56.7 kg)  05/18/18 127 lb (57.6 kg)       History of Present Illness: Margaret Ortiz is a 58 y.o. female  Who had a stroke in November 2018.  Per the records: "Patient was found down May 19, 2017 was found to be secondary to a left MCA stroke. Was felt to likely have been cardioembolic from undiagnosed cardiomyopathy since her ejection fraction was found to be 15-20%. Repeat echo on November 30, 2018showed improved ejection fraction of 20-25%. She suffered from dysphasia required an NG tubeintially. SHe was admitted to acute rehab December 4 to July 01, 2017. She was upgraded to a regular diet with thin liquids via MBS on June 27, 2017. Apparently ambulated with assistance at acute rehab. She had been following in the outpatient setting withvisits including cardiology and neurology appointments. Otherwise she stayed at a [facility] ([facility]) until 09/11/2017 when she was discharged and moved to [facility]. Cardiomyopathy, unspecified type (HCC)/Cardiac LV ejection fraction 21-30%"  Recent echocardiogram done in [place] in February 2019 showed ejection fraction 15 to 20% with mild to moderate mitral regurgitation.  Repeat echo in 01/2018 showed: Impressions:  - Severe global reduction in LV systolic function; moderate diastolic dysfunction; mild MR; mild LAE.  Since the last visit, she has been walking 30-40 minutes at a time, several times a day.    Denies : Chest pain. Dizziness. Leg edema. Nitroglycerin use. Orthopnea. Palpitations. Paroxysmal nocturnal dyspnea. Shortness of breath. Syncope.    Past Medical History:  Diagnosis Date  . Aphasia 09/17/2017  . Aphasia as late effect of cerebrovascular accident (CVA) 05/19/2017     s/p stroke, attempts to speak very difficult to understand.  . Cardiac LV ejection fraction 21-30%   . Cardioembolic stroke ([place]) 80/99/8338   Right hemiparesis  . Cardiomyopathy ([place]) 04/2017  . Dysphagia 05/19/2017   Last known diet upgraded to regular diet with thin liquids on 06/27/2017, no records available  . 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 Fall 09/17/2017  . Graves disease   . H/O ischemic left MCA stroke 09/17/2017  . Hemiparesis of right dominant side as late effect of cerebral infarction ([place]) 09/17/2017  . Hyperlipidemia   . Hypertension   . Neuropathy 05/19/2017   s/p stroke  . Plaque psoriasis   . Shoulder subluxation, right, initial encounter 09/17/2017  . Weakness 09/17/2017    Past Surgical History:  Procedure Laterality Date  . NO PAST SURGERIES       Current Outpatient Medications  Medication Sig Dispense Refill  . acetaminophen (TYLENOL) 325 MG tablet Take 650 mg by mouth every 6 (six) hours as needed.    Marland Kitchen apixaban (ELIQUIS) 5 MG TABS tablet Take 1 tablet (5 mg total) by mouth 2 (two) times daily. 180 tablet 3  . atorvastatin (LIPITOR) 80 MG tablet Take 1 tablet (80 mg total) by mouth daily. 30 tablet 6  . carvedilol (COREG) 3.125 MG tablet Take 1 tablet (3.125 mg total) by mouth 2 (two) times daily. 180 tablet 3  . clobetasol cream (TEMOVATE) 2.50 % Apply 1 application topically 2 (two) times daily. 30 g 0  . esomeprazole (NEXIUM) 40 MG capsule Take  1 capsule (40 mg total) by mouth daily at 12 noon. 90 capsule 3  . gabapentin (NEURONTIN) 300 MG capsule Take 1 capsule (300 mg total) by mouth as directed. 300mg  in AM and 600mg  at bedtime for 1 week, then 300mg  in AM, 300mg  in afternoon, and 600mg  at bedtime 120 capsule 3  . OVER THE COUNTER MEDICATION CBD Oil     No current facility-administered medications for this visit.     Allergies:   Dust mite extract and Penicillins    Social History:  The patient  reports that she has quit  smoking. She has never used smokeless tobacco. She reports that she does not drink alcohol or use drugs.   Family History:  The patient's family history includes Early death in her father; Heart attack in her brother; Hyperlipidemia in her mother; Hypertension in her mother and sister; Mental illness in her brother.    ROS:  Please see the history of present illness.   Otherwise, review of systems are positive for impaired speech after her stroke.   All other systems are reviewed and negative.    PHYSICAL EXAM: VS:  BP (!) 90/50   Pulse (!) 59   Ht 5\' 2"  (1.575 m)   Wt 118 lb 12.8 oz (53.9 kg)   SpO2 97%   BMI 21.73 kg/m  , BMI Body mass index is 21.73 kg/m. GEN: Well nourished, well developed, in no acute distress  HEENT: normal  Neck: no JVD, carotid bruits, or masses Cardiac: RRR; no murmurs, rubs, or gallops,no edema  Respiratory:  clear to auscultation bilaterally, normal work of breathing GI: soft, nontender, nondistended, + BS MS: no deformity or atrophy  Skin: warm and dry, no rash Neuro:  Strength and sensation are intact Psych: euthymic mood, full affect   EKG:   The ekg ordered today demonstrates sinus bradycardia, nonspecific ST T wave changes   Recent Labs: 10/01/2017: ALT 21; Hemoglobin 13.6; Platelets 332.0; TSH 1.27 03/31/2018: BUN 18; Creatinine, Ser 0.94; Potassium 4.8; Sodium 138   Lipid Panel    Component Value Date/Time   CHOL 177 06/22/2018 1043   TRIG 56.0 06/22/2018 1043   HDL 58.30 06/22/2018 1043   CHOLHDL 3 06/22/2018 1043   VLDL 11.2 06/22/2018 1043   LDLCALC 108 (H) 06/22/2018 1043     Other studies Reviewed: Additional studies/ records that were reviewed today with results demonstrating: Prior echo reviewed as noted above.   ASSESSMENT AND PLAN:  1. Chronic systolic heart failure: She appears euvolemic.  Her exercise tolerance is improving.  She is walking regularly.  Blood pressure does not allow any additional CHF meds.  Continue  current medications.  She inquired about having a repeat echocardiogram.  Since she just had 1 6 months ago and is not having any new symptoms, would hold off. 2. Prior CVA: Some residual right-sided weakness and speech impairment. 3. Mitral regurgitation: Mild to moderate mitral regurgitation in the past.  Was mild on the most recent echocardiogram. 4. Anticoagulated: Tolerating her anticoagulation without any bleeding issues.  Last hemoglobin was 13.6 in April 2019.  This should be rechecked at her next blood test.   Current medicines are reviewed at length with the patient today.  The patient concerns regarding her medicines were addressed.  The following changes have been made:  No change  Labs/ tests ordered today include:  No orders of the defined types were placed in this encounter.   Recommend 150 minutes/week of aerobic exercise Low fat, low  carb, high fiber diet recommended  Disposition:   FU in 8 months   Signed, Larae Grooms, MD  08/24/2018 1:49 PM    [facility] [place], [place], [place]  [address] Phone: [phone]; Fax: [phone]

## 2018-08-25 ENCOUNTER — Ambulatory Visit: Payer: BLUE CROSS/BLUE SHIELD | Attending: Family Medicine | Admitting: Occupational Therapy

## 2018-08-25 DIAGNOSIS — G8929 Other chronic pain: Secondary | ICD-10-CM | POA: Diagnosis present

## 2018-08-25 DIAGNOSIS — R278 Other lack of coordination: Secondary | ICD-10-CM | POA: Diagnosis present

## 2018-08-25 DIAGNOSIS — M6281 Muscle weakness (generalized): Secondary | ICD-10-CM | POA: Diagnosis not present

## 2018-08-25 DIAGNOSIS — R471 Dysarthria and anarthria: Secondary | ICD-10-CM | POA: Insufficient documentation

## 2018-08-25 DIAGNOSIS — M25511 Pain in right shoulder: Secondary | ICD-10-CM | POA: Diagnosis present

## 2018-08-25 DIAGNOSIS — R208 Other disturbances of skin sensation: Secondary | ICD-10-CM | POA: Diagnosis present

## 2018-08-25 DIAGNOSIS — R41841 Cognitive communication deficit: Secondary | ICD-10-CM | POA: Diagnosis present

## 2018-08-25 DIAGNOSIS — R2681 Unsteadiness on feet: Secondary | ICD-10-CM | POA: Insufficient documentation

## 2018-08-25 DIAGNOSIS — R41842 Visuospatial deficit: Secondary | ICD-10-CM | POA: Insufficient documentation

## 2018-08-25 DIAGNOSIS — I69351 Hemiplegia and hemiparesis following cerebral infarction affecting right dominant side: Secondary | ICD-10-CM | POA: Insufficient documentation

## 2018-08-25 DIAGNOSIS — R4701 Aphasia: Secondary | ICD-10-CM | POA: Insufficient documentation

## 2018-08-25 DIAGNOSIS — R2689 Other abnormalities of gait and mobility: Secondary | ICD-10-CM | POA: Diagnosis present

## 2018-08-25 DIAGNOSIS — I69318 Other symptoms and signs involving cognitive functions following cerebral infarction: Secondary | ICD-10-CM | POA: Diagnosis present

## 2018-08-25 DIAGNOSIS — R29818 Other symptoms and signs involving the nervous system: Secondary | ICD-10-CM | POA: Insufficient documentation

## 2018-08-26 NOTE — Therapy (Signed)
[facility] [address] [place], [place], [address] Phone: [phone]   Fax:  [phone]  Occupational Therapy Treatment  Patient Details  Name: Margaret Ortiz MRN: [id number] Date of Birth: 02/12/1961 Referring Provider (OT): Dr. Metta Clines   Encounter Date: 08/25/2018  OT End of Session - 08/26/18 1634    Visit Number  3    Number of Visits  12    Date for OT Re-Evaluation  09/28/18    Authorization Type  BCBS--    OT Start Time  1320    OT Stop Time  1400    OT Time Calculation (min)  40 min       Past Medical History:  Diagnosis Date  . Aphasia 09/17/2017  . 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  . Cardiac LV ejection fraction 21-30%   . Cardioembolic stroke ([place]) 32/95/1884   Right hemiparesis  . Cardiomyopathy ([place]) 04/2017  . Dysphagia 05/19/2017   Last known diet upgraded to regular diet with thin liquids on 06/27/2017, no records available  . 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 Fall 09/17/2017  . Graves disease   . H/O ischemic left MCA stroke 09/17/2017  . Hemiparesis of right dominant side as late effect of cerebral infarction ([place]) 09/17/2017  . Hyperlipidemia   . Hypertension   . Neuropathy 05/19/2017   s/p stroke  . Plaque psoriasis   . Shoulder subluxation, right, initial encounter 09/17/2017  . Weakness 09/17/2017    Past Surgical History:  Procedure Laterality Date  . NO PAST SURGERIES      There were no vitals filed for this visit.  Subjective Assessment - 08/26/18 1633    Patient Stated Goals  improve hand, improve pain    Currently in Pain?  Yes    Pain Score  4     Pain Location  Shoulder    Pain Orientation  Right    Pain Descriptors / Indicators  Aching    Pain Type  Acute pain    Pain Onset  1 to 4 weeks ago    Pain Frequency  Intermittent    Aggravating Factors    walking    Pain Relieving Factors  tylenol       Treatment: Pt brought in giv mohr sling, therapsist worked with pt for proper fit and postioning. Pt ambulated pain free wearing sling in proper position. Seated edge of mat, reaching for floor bilateral UE's for self stretch followed by standing to face mat while weightbearing though bilateral UE's to perform gentle self stretch and ROM. Pt performed grasp/ release  of medium ball with bilateral UE's with min facilitation   Pt practiced using RUE to stabilizes various sized bottles while opening with LUE , min v.c faciliation Gentle weightbearing through RUE edge of mat with body on arm movements mod facilitation. Pt reported she was pain free end of session.                      OT Short Term Goals - 07/30/18 1746      OT SHORT TERM GOAL #1   Title  Pt/family independent with updated HEP for RUE-- check STGs 08/28/18    Time  4    Period  Weeks    Status  New      OT SHORT TERM GOAL #2   Title  Pt  will report R shoulder/neck pain less than or equal to 5/10 for functional tasks.    Time  4    Period  Weeks    Status  New      OT SHORT TERM GOAL #3   Title  Pt will verbalize understanding of proper positioning of RUE to decr pain.    Time  4    Period  Weeks    Status  New      OT SHORT TERM GOAL #4   Title  ---      OT SHORT TERM GOAL #5   Title  --        OT Long Term Goals - 07/30/18 1748      OT LONG TERM GOAL #1   Title  Pt will perform simple home maintenance tasks mod I incorporating RUE as stabilizer or using hand-over-hand techniques when able.--check LTGs 09/28/18    Time  8    Period  Weeks    Status  New      OT LONG TERM GOAL #2   Title  Pt will report R shoulder/neck pain less than or equal to 3/10 for ADLs.    Time  8    Period  Weeks    Status  New      OT LONG TERM GOAL #3   Title  --      OT LONG TERM GOAL #4   Title  --      OT LONG TERM GOAL #5   Title  --             Plan - 08/26/18 1635    Clinical Impression Statement  Pt is progressing slowly towards goals.  Pt reports she is painfree with ambulation when giv mohr sling is properly positioned.    Occupational Profile and client history currently impacting functional performance  Pt was independent prior to CVA and living in [place], [place].  Pt currently living with sister/brother-in-law and is not participating in household tasks, unable to work, drive, or live alone safely.    Occupational performance deficits (Please refer to evaluation for details):  ADL's;IADL's;Leisure;Work;Social Participation    Rehab Potential  Fair    OT Frequency  2x / week    OT Duration  4 weeks    OT Treatment/Interventions  Self-care/ADL training;Electrical Stimulation;Therapeutic exercise;Patient/family education;Splinting;Neuromuscular education;Paraffin;Moist Heat;Fluidtherapy;Energy conservation;Therapist, nutritional;Therapeutic activities;Cryotherapy;Ultrasound;DME and/or AE instruction;Manual Therapy;Passive range of motion;Cognitive remediation/compensation;Balance training    Plan  pain reduction strategies for RUE,  continue to reinforce positioning of RUE with sleeping and  ambulation    Consulted and Agree with Plan of Care  Patient       Patient will benefit from skilled therapeutic intervention in order to improve the following deficits and impairments:     Visit Diagnosis: Muscle weakness (generalized)  Other symptoms and signs involving the nervous system  Hemiplegia and hemiparesis following cerebral infarction affecting right dominant side [facility])    Problem List Patient Active Problem List   Diagnosis Date Noted  . Difficulty coping with disease 03/31/2018  . Hemiparesis of right dominant side as late effect of cerebral infarction ([place]) 09/17/2017  . Cardioembolic stroke ([place]) 78/93/8101  . 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  Cardiomyopathy ([place]) 09/17/2017  . H/O ischemic left MCA stroke 09/17/2017  . Cardiac LV ejection fraction 21-30%   . Graves disease   . Plaque psoriasis   . Neuropathy 05/19/2017    Karenna Romanoff 08/26/2018, 4:37 PM  [facility] [address] [place], [place], [address] Phone: [phone]   Fax:  [phone]  Name: Margaret Ortiz MRN: [id number] Date of Birth: 1960-06-26

## 2018-08-27 ENCOUNTER — Ambulatory Visit: Payer: BLUE CROSS/BLUE SHIELD | Admitting: Speech Pathology

## 2018-08-27 ENCOUNTER — Ambulatory Visit: Payer: BLUE CROSS/BLUE SHIELD | Admitting: Rehabilitative and Restorative Service Providers"

## 2018-08-27 ENCOUNTER — Encounter: Payer: Self-pay | Admitting: Occupational Therapy

## 2018-08-27 ENCOUNTER — Ambulatory Visit: Payer: BLUE CROSS/BLUE SHIELD | Admitting: Occupational Therapy

## 2018-08-27 ENCOUNTER — Encounter: Payer: Self-pay | Admitting: Rehabilitative and Restorative Service Providers"

## 2018-08-27 DIAGNOSIS — R4701 Aphasia: Secondary | ICD-10-CM

## 2018-08-27 DIAGNOSIS — I69351 Hemiplegia and hemiparesis following cerebral infarction affecting right dominant side: Secondary | ICD-10-CM

## 2018-08-27 DIAGNOSIS — R471 Dysarthria and anarthria: Secondary | ICD-10-CM

## 2018-08-27 DIAGNOSIS — M25511 Pain in right shoulder: Secondary | ICD-10-CM

## 2018-08-27 DIAGNOSIS — R2681 Unsteadiness on feet: Secondary | ICD-10-CM

## 2018-08-27 DIAGNOSIS — R208 Other disturbances of skin sensation: Secondary | ICD-10-CM

## 2018-08-27 DIAGNOSIS — M6281 Muscle weakness (generalized): Secondary | ICD-10-CM

## 2018-08-27 DIAGNOSIS — R29818 Other symptoms and signs involving the nervous system: Secondary | ICD-10-CM

## 2018-08-27 DIAGNOSIS — G8929 Other chronic pain: Secondary | ICD-10-CM

## 2018-08-27 DIAGNOSIS — R2689 Other abnormalities of gait and mobility: Secondary | ICD-10-CM

## 2018-08-27 DIAGNOSIS — R41842 Visuospatial deficit: Secondary | ICD-10-CM

## 2018-08-27 DIAGNOSIS — R41841 Cognitive communication deficit: Secondary | ICD-10-CM

## 2018-08-27 NOTE — Therapy (Addendum)
[facility] [address] [place], [place], [address] Phone: [phone]   Fax:  [phone]  Physical Therapy Treatment  Patient Details  Name: Margaret Ortiz MRN: [id number] Date of Birth: 1960-08-21 Referring Provider (PT): Metta Clines, DO   Encounter Date: 08/27/2018  PT End of Session - 08/27/18 2110    Visit Number  3    Number of Visits  8    Date for PT Re-Evaluation  09/28/18    Authorization Type  BCBS    PT Start Time  1405    PT Stop Time  1448    PT Time Calculation (min)  43 min    Activity Tolerance  Patient tolerated treatment well    Behavior During Therapy  [facility] for tasks assessed/performed       Past Medical History:  Diagnosis Date  . Aphasia 09/17/2017  . 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  . Cardiac LV ejection fraction 21-30%   . Cardioembolic stroke ([place]) 90/24/0973   Right hemiparesis  . Cardiomyopathy ([place]) 04/2017  . Dysphagia 05/19/2017   Last known diet upgraded to regular diet with thin liquids on 06/27/2017, no records available  . 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 Fall 09/17/2017  . Graves disease   . H/O ischemic left MCA stroke 09/17/2017  . Hemiparesis of right dominant side as late effect of cerebral infarction ([place]) 09/17/2017  . Hyperlipidemia   . Hypertension   . Neuropathy 05/19/2017   s/p stroke  . Plaque psoriasis   . Shoulder subluxation, right, initial encounter 09/17/2017  . Weakness 09/17/2017    Past Surgical History:  Procedure Laterality Date  . NO PAST SURGERIES      There were no vitals filed for this visit.  Subjective Assessment - 08/27/18 1426    Subjective  The patient notes she wants to get up and down from the floor again today.  During walking, she mentions she would like to practice push-ups (see self care).    Patient Stated Goals   get rid of brace, use exercise equipment.  She wants to do floor exercises.    Currently in Pain?  No/denies   None per multiple inquiries at start of PT                      [facility] Adult PT Treatment/Exercise - 08/27/18 1427      Transfers   Transfers  Floor to Transfer    Floor to Transfer  4: Min guard;5: Supervision    Floor to Transfer Details (indicate cue type and reason)  PT demonstrated and talked patient through each step of floor<>stand transfer uisng a mat for UE support.  Performed a 2nd repetition with PT reducing demo and verbal cues for improved learning.        Ambulation/Gait   Ambulation/Gait  Yes    Ambulation/Gait Assistance  6: Modified independent (Device/Increase time)    Ambulation Distance (Feet)  1200 Feet    Assistive device  Straight cane;None    Ambulation Surface  Level;Indoor;Outdoor;Paved    Curb  5: Supervision    Curb Details (indicate cue type and reason)  Completed x 3 reps with patient initially leaning on car for support, then on [facility] for support.     Gait Comments  The patient ambulated with PT >15 minutes nonstop with GivMohr right arm sling.  She used SPC x 900 feet and no device with supervision x 300 ft of walk on paved surfaces.        Exercises   Exercises  Other Exercises    Other Exercises   Seated heel cord stretching and hamstring stretching.  Patient requested to try exercise without AFO.  Patient uses hip and knee to gain ankle movement, however continues with no isolated ankle movement.      Self Care:  PT and patient discussed that push ups will not be safe due to hemiplegia R UE.  She inquired about exercise without AFO,  She is unable to elicit a muscle contraction for R DF.  PT recommended continued use of AFO.         PT Short Term Goals - 07/30/18 1350      PT SHORT TERM GOAL #1   Title  The patient will be able to perform HEP mod indep for LE strength (ankle isolated control, hamstring, hip extensors).     Time  4    Period  Weeks    Target Date  08/29/18      PT SHORT TERM GOAL #2   Title  The patient will improve Berg score from 44/56 to > or equal to 48/56 to demo improving balance.    Time  4    Period  Weeks    Target Date  08/29/18      PT SHORT TERM GOAL #3   Title  The patient will ambulate 30 minutes with PT to observe for postural asymmetry that may be increasing shoulder/neck pain on R side *possible that shorter duration may help improve mechanics-- she may be getting worsening mechanics with fatigue.    Time  4    Period  Weeks    Target Date  08/29/18        PT Long Term Goals - 07/30/18 1351      PT LONG TERM GOAL #1   Title  The patient will negotiate a curb mod indep.    Time  8    Period  Weeks    Target Date  09/28/18      PT LONG TERM GOAL #2   Title  The patient will move floor<>stand with mod indep using L UE support on surface.      PT LONG TERM GOAL #3   Title  The patient will improve gait speed without device from 1.42 ft/sec to > or equal to 1.8 ft/sec to demo improving balance/stability without a device.    Time  8    Period  Weeks    Target Date  09/28/18            Plan - 08/27/18 2144    Clinical Impression Statement  The patient is improving with floor transfer today.  PT to continue with repetition to enforce carryover.  The patient was able to keep shoulder height symmetrical with use of givmohr sling during ambulation.  PT to continue working to [employer].     PT Treatment/Interventions  ADLs/Self Care Home Management;Therapeutic activities;Therapeutic exercise;Neuromuscular re-education;Balance training;Gait training;Stair training;Functional mobility training;Patient/family education;Manual techniques    PT Next Visit Plan  [place]view prior HEP, check STGs, review floor transfers    Consulted and Agree with Plan of Care  Patient       Patient will benefit from skilled therapeutic intervention in order to improve the following deficits  and impairments:  Abnormal gait, Decreased balance, Decreased strength, Postural dysfunction, Impaired flexibility, Pain  Visit Diagnosis: Muscle weakness (generalized)  Other symptoms and signs involving the nervous system  Unsteadiness on feet  Other abnormalities of gait and mobility     Problem List Patient Active Problem List   Diagnosis Date Noted  . Difficulty coping with disease 03/31/2018  . Hemiparesis of right dominant side as late effect of cerebral infarction ([place]) 09/17/2017  . Cardioembolic stroke ([place]) 00/05/3934  . 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 Cardiomyopathy ([place]) 09/17/2017  . H/O ischemic left MCA stroke 09/17/2017  . Cardiac LV ejection fraction 21-30%   . Graves disease   . Plaque psoriasis   . Neuropathy 05/19/2017    Lurleen Soltero, PT 08/27/2018, 9:49 PM  [facility] [address] [place], [place], [address] Phone: [phone]   Fax:  [phone]  Name: Margaret Ortiz MRN: [id number] Date of Birth: 04-22-1961

## 2018-08-27 NOTE — Therapy (Signed)
[facility] [address] [place], [place], [address] Phone: [phone]   Fax:  [phone]  Occupational Therapy Treatment  Patient Details  Name: Margaret Ortiz MRN: [id number] Date of Birth: [birth year] Referring Provider (OT): Dr. Metta Clines   Encounter Date: 08/27/2018  OT End of Session - 08/27/18 1554    Visit Number  4    Number of Visits  12    Date for OT Re-Evaluation  09/28/18    Authorization Type  BCBS--    OT Start Time  1316    OT Stop Time  1359    OT Time Calculation (min)  43 min    Activity Tolerance  Patient tolerated treatment well       Past Medical History:  Diagnosis Date  . Aphasia 09/17/2017  . 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  . Cardiac LV ejection fraction 21-30%   . Cardioembolic stroke ([facility]) 23/53/6144   Right hemiparesis  . Cardiomyopathy ([facility]) 04/2017  . Dysphagia 05/19/2017   Last known diet upgraded to regular diet with thin liquids on 06/27/2017, no records available  . 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 Fall 09/17/2017  . Graves disease   . H/O ischemic left MCA stroke 09/17/2017  . Hemiparesis of right dominant side as late effect of cerebral infarction ([facility]) 09/17/2017  . Hyperlipidemia   . Hypertension   . Neuropathy 05/19/2017   s/p stroke  . Plaque psoriasis   . Shoulder subluxation, right, initial encounter 09/17/2017  . Weakness 09/17/2017    Past Surgical History:  Procedure Laterality Date  . NO PAST SURGERIES      There were no vitals filed for this visit.  Subjective Assessment - 08/27/18 1322    Subjective   Yes my shoulder and neck hurt    Pertinent History  Pt with CVA 05/19/2017    Patient Stated Goals  improve hand, improve pain    Currently in Pain?  Yes    Pain Score  4     Pain Location  Shoulder    Pain Orientation  Right    Pain Descriptors / Indicators  Aching;Sore    Pain Type  Acute pain    Pain Radiating Towards  radiates into neck    Pain Frequency  Intermittent    Aggravating Factors   walking (altho pt reported she could feel the pain sitting on mat in gym)    Pain Relieving Factors  tylenol, gabepentin, feels using sling for longer walks is helping some as well                   OT Treatments/Exercises (OP) - 08/27/18 0001      ADLs   ADL Comments  Discussion regarding use of aquatic therapy to address neck/shoulder pain, postural alignment and control.  Pt wishes to participate in aquatic therapy - pt understands that her sister must confirm that she can attend pool sessions with pt and note sent home for sister.  Also addressed bed positioning for RUE support to reduce pain in neck and R shoulder - took pictures on pt's phone for carrry over at home.        Manual Therapy   Manual Therapy  Joint mobilization;Soft tissue mobilization;Scapular mobilization    Manual therapy comments  joint, soft tisssue and scap mob to begin to address R shoulder and neck pain  as well as decrease tightness. Pt does report she is using Giv Mohr sling now when taking longer walks and feels this is helping some.               OT Education - 08/27/18 1552    Education Details  bed positioning for RUE on back and semi left side, aquatic therapy    Person(s) Educated  Patient    Methods  Explanation;Demonstration;Verbal cues;Handout    Comprehension  Verbalized understanding;Returned demonstration       OT Short Term Goals - 08/27/18 1553      OT SHORT TERM GOAL #1   Title  Pt/family independent with updated HEP for RUE-- check STGs 08/28/18    Time  4    Period  Weeks    Status  New      OT SHORT TERM GOAL #2   Title  Pt will report R shoulder/neck pain less than or equal to 5/10 for functional tasks.    Time  4    Period  Weeks    Status  New      OT SHORT TERM GOAL #3   Title  Pt will  verbalize understanding of proper positioning of RUE to decr pain.    Time  4    Period  Weeks    Status  New      OT SHORT TERM GOAL #4   Title  ---      OT SHORT TERM GOAL #5   Title  --        OT Long Term Goals - 08/27/18 1553      OT LONG TERM GOAL #1   Title  Pt will perform simple home maintenance tasks mod I incorporating RUE as stabilizer or using hand-over-hand techniques when able.--check LTGs 09/28/18    Time  8    Period  Weeks    Status  New      OT LONG TERM GOAL #2   Title  Pt will report R shoulder/neck pain less than or equal to 3/10 for ADLs.    Time  8    Period  Weeks    Status  New      OT LONG TERM GOAL #3   Title  --      OT LONG TERM GOAL #4   Title  --      OT LONG TERM GOAL #5   Title  --            Plan - 08/27/18 1553    Clinical Impression Statement  Pt progressing toward goals. Pt report she is now using Giv Mohr sling for longer walks.     Occupational performance deficits (Please refer to evaluation for details):  ADL's;IADL's;Leisure;Work;Social Participation    Rehab Potential  Fair    OT Frequency  2x / week    OT Duration  4 weeks    OT Treatment/Interventions  Self-care/ADL training;Electrical Stimulation;Therapeutic exercise;Patient/family education;Splinting;Neuromuscular education;Paraffin;Moist Heat;Fluidtherapy;Energy conservation;Therapist, nutritional;Therapeutic activities;Cryotherapy;Ultrasound;DME and/or AE instruction;Manual Therapy;Passive range of motion;Cognitive remediation/compensation;Balance training;Aquatic Therapy    Consulted and Agree with Plan of Care  Patient       Patient will benefit from skilled therapeutic intervention in order to improve the following deficits and impairments:     Visit Diagnosis: Muscle weakness (generalized) - Plan: Ot plan of care cert/re-cert  Other symptoms and signs involving the nervous system - Plan: Ot plan of care cert/re-cert  Hemiplegia and hemiparesis  following cerebral infarction affecting right  dominant side ([facility]) - Plan: Ot plan of care cert/re-cert  Unsteadiness on feet - Plan: Ot plan of care cert/re-cert  Other disturbances of skin sensation - Plan: Ot plan of care cert/re-cert  Chronic right shoulder pain - Plan: Ot plan of care cert/re-cert  Visuospatial deficit - Plan: Ot plan of care cert/re-cert    Problem List Patient Active Problem List   Diagnosis Date Noted  . Difficulty coping with disease 03/31/2018  . Hemiparesis of right dominant side as late effect of cerebral infarction ([facility]) 09/17/2017  . Cardioembolic stroke ([facility]) 93/81/8299  . 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 Cardiomyopathy ([facility]) 09/17/2017  . H/O ischemic left MCA stroke 09/17/2017  . Cardiac LV ejection fraction 21-30%   . Graves disease   . Plaque psoriasis   . Neuropathy 05/19/2017    Quay Burow, OTR/L 08/27/2018, 3:57 PM  [facility] [address] [place], [place], [address] Phone: [phone]   Fax:  [phone]  Name: Valerye Kobus MRN: [id number] Date of Birth: April 04, 1961

## 2018-08-28 NOTE — Therapy (Addendum)
[facility] [address] [place], [place], [address] Phone: [phone]   Fax:  [phone]  Speech Language Pathology Treatment  Patient Details  Name: Margaret Ortiz MRN: [id number] Date of Birth: Mar 02, 1961 Referring Provider (SLP): Dr. Tomi Likens   Encounter Date: 08/27/2018  End of Session - 08/28/18 1447    Visit Number  5    Number of Visits  5    Date for SLP Re-Evaluation  09/28/18    SLP Start Time  1449    SLP Stop Time   1530    SLP Time Calculation (min)  41 min    Activity Tolerance  Patient tolerated treatment well       Past Medical History:  Diagnosis Date  . Aphasia 09/17/2017  . 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  . Cardiac LV ejection fraction 21-30%   . Cardioembolic stroke ([place]) 48/18/5631   Right hemiparesis  . Cardiomyopathy ([facility]) 04/2017  . Dysphagia 05/19/2017   Last known diet upgraded to regular diet with thin liquids on 06/27/2017, no records available  . 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 Fall 09/17/2017  . Graves disease   . H/O ischemic left MCA stroke 09/17/2017  . Hemiparesis of right dominant side as late effect of cerebral infarction ([facility]) 09/17/2017  . Hyperlipidemia   . Hypertension   . Neuropathy 05/19/2017   s/p stroke  . Plaque psoriasis   . Shoulder subluxation, right, initial encounter 09/17/2017  . Weakness 09/17/2017    Past Surgical History:  Procedure Laterality Date  . NO PAST SURGERIES      There were no vitals filed for this visit.  Subjective Assessment - 08/28/18 1437    Subjective  "I hope I can do things like before."    Currently in Pain?  No/denies            ADULT SLP TREATMENT - 08/28/18 1438      General Information   Behavior/Cognition  Alert;Pleasant mood;Cooperative      Treatment Provided   Treatment provided   Cognitive-Linquistic      Pain Assessment   Pain Assessment  No/denies pain      Cognitive-Linquistic Treatment   Treatment focused on  Aphasia;Apraxia;Dysarthria;Cognition    Skilled Treatment  Pt reports practicing her work terminology at home but did not record herself, "I didn't do that, I'm sorry." SLP continued to work with pt on compensation strategies for aphasia/motor speech and error awareness in simple conversation by providing usual written and verbal cues for errors. Pt's speech with fewer errors when using slow rate; she required occasional cues to do so when breakdowns occurred. When encountering wordfinding difficulties, pt told SLP "Nevermind" or "It's OK," on several occasions. SLP provided extended time and usual question cues, and pt ultimately able to relay her message. SLP told pt that these moments are opportunities for her to work on her language flexibility and encouraged pt not to abandon topics if she cannot find the exact words. 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 (slow down, describe, use a different word)    Person(s) Educated  Patient    Methods  Explanation    Comprehension  Verbalized understanding;Verbal cues required         SLP Long Term Goals - 08/27/18 1506      SLP LONG TERM GOAL #1   Title  Pt will use compensations for dysarthria in 5 minute conversation x 3 visits with occasional min A.    Time  1    Period  Weeks    Status  Partially Met      SLP LONG TERM GOAL #2    Title  Pt/caregiver will report pt participation in maintenance routine for communication/cognitive skills x 2 sessions.    Time  3    Period  Weeks    Status  Not Met      SLP LONG TERM GOAL #3   Title  Pt will report contacting at least one community resource ([place], vocational rehabilitation, counseling or neuropsych)    [facility] - initial contact voice to voice and follow up with email    Time  3    Period  Weeks    Status  Achieved       Plan - 08/28/18 1448    Clinical Impression Statement  Chronic aphasia persists. She has not yet called voc rehab, but states she plans to attend [facility]. Pt generally using slower rate which reduces errors, though cues required to do so occasionally. Pt also requiring cues today for using strategies for anomia. Pt remains driven to improve her speech and communication, however her fixation on returning to premorbid function has limited her willingness/ability to adopt/employ compensatory strategies trained in therapy. Education completed and pt d/c today due to max rehab potential achieved. Pt in agreement with d/c.     Speech Therapy Frequency  --   d/c   Duration  --   d/c   Treatment/Interventions  Language facilitation;Compensatory techniques;SLP instruction and feedback;Patient/family education;Multimodal communcation approach;Functional tasks    Potential to Achieve Goals  Fair    Potential Considerations  Ability to learn/carryover information;Cooperation/participation level;Previous level of function       Patient will benefit from skilled therapeutic intervention in order to improve the following deficits and impairments:   Aphasia  Cognitive communication deficit  Dysarthria and anarthria    Problem List Patient Active Problem List   Diagnosis Date Noted  . Difficulty coping with disease 03/31/2018  . Hemiparesis of right dominant side as late effect of cerebral  infarction ([facility]) 09/17/2017  . Cardioembolic stroke ([facility]) 22/29/7989  . 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 Cardiomyopathy ([facility]) 09/17/2017  . H/O ischemic left MCA stroke 09/17/2017  . Cardiac LV ejection fraction 21-30%   . Graves disease   . Plaque psoriasis   . Neuropathy 05/19/2017   SPEECH THERAPY DISCHARGE SUMMARY  Visits from Start of Care: 5  Current functional level related to goals / functional outcomes: 1/3 LTGs met, 1 goal partially met. Simple conversation in a quiet environment is functional with occasional question cues for clarification and occasional min cues for slower rate.    Remaining deficits: Chronic aphasia, cognitive impairments, mild  dysarthria persist.   Education / Equipment: Compensations for aphasia and dysarthria, stroke recovery and prognosis, community resources for aphasia, transportation and vocational rehabilitation Plan: Patient agrees to discharge.  Patient goals were partially met. Patient is being discharged due to lack of progress.  ?????         Deneise Lever, [place], CCC-SLP Speech-Language Pathologist  Aliene Altes 08/28/2018, 3:02 PM  [facility] [address] [place], [place], [address] Phone: [phone]   Fax:  [phone]   Name: Margaret Ortiz MRN: [id number] Date of Birth: [birth year]

## 2018-08-31 ENCOUNTER — Ambulatory Visit: Payer: BLUE CROSS/BLUE SHIELD | Admitting: Occupational Therapy

## 2018-08-31 ENCOUNTER — Encounter: Payer: Self-pay | Admitting: Rehabilitative and Restorative Service Providers"

## 2018-08-31 ENCOUNTER — Encounter: Payer: Self-pay | Admitting: Occupational Therapy

## 2018-08-31 ENCOUNTER — Ambulatory Visit: Payer: BLUE CROSS/BLUE SHIELD | Admitting: Rehabilitative and Restorative Service Providers"

## 2018-08-31 DIAGNOSIS — R29818 Other symptoms and signs involving the nervous system: Secondary | ICD-10-CM

## 2018-08-31 DIAGNOSIS — R208 Other disturbances of skin sensation: Secondary | ICD-10-CM

## 2018-08-31 DIAGNOSIS — R2689 Other abnormalities of gait and mobility: Secondary | ICD-10-CM

## 2018-08-31 DIAGNOSIS — R2681 Unsteadiness on feet: Secondary | ICD-10-CM

## 2018-08-31 DIAGNOSIS — M6281 Muscle weakness (generalized): Secondary | ICD-10-CM | POA: Diagnosis not present

## 2018-08-31 DIAGNOSIS — G8929 Other chronic pain: Secondary | ICD-10-CM

## 2018-08-31 DIAGNOSIS — M25511 Pain in right shoulder: Secondary | ICD-10-CM

## 2018-08-31 DIAGNOSIS — I69351 Hemiplegia and hemiparesis following cerebral infarction affecting right dominant side: Secondary | ICD-10-CM

## 2018-08-31 NOTE — Therapy (Signed)
[facility] [address] [place], [place], [address] Phone: [phone]   Fax:  [phone]  Occupational Therapy Treatment  Patient Details  Name: Margaret Ortiz MRN: [id number] Date of Birth: 1960/06/25 Referring Provider (OT): Dr. Metta Clines   Encounter Date: 08/31/2018  OT End of Session - 08/31/18 1530    Visit Number  5    Number of Visits  12    Date for OT Re-Evaluation  09/28/18    Authorization Type  BCBS--no visit limt    OT Start Time  1320    OT Stop Time  1400    OT Time Calculation (min)  40 min    Activity Tolerance  Patient tolerated treatment well    Behavior During Therapy  [facility] for tasks assessed/performed       Past Medical History:  Diagnosis Date  . Aphasia 09/17/2017  . 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  . Cardiac LV ejection fraction 21-30%   . Cardioembolic stroke ([facility]) 03/50/0938   Right hemiparesis  . Cardiomyopathy ([facility]) 04/2017  . Dysphagia 05/19/2017   Last known diet upgraded to regular diet with thin liquids on 06/27/2017, no records available  . 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 Fall 09/17/2017  . Graves disease   . H/O ischemic left MCA stroke 09/17/2017  . Hemiparesis of right dominant side as late effect of cerebral infarction ([facility]) 09/17/2017  . Hyperlipidemia   . Hypertension   . Neuropathy 05/19/2017   s/p stroke  . Plaque psoriasis   . Shoulder subluxation, right, initial encounter 09/17/2017  . Weakness 09/17/2017    Past Surgical History:  Procedure Laterality Date  . NO PAST SURGERIES      There were no vitals filed for this visit.  Subjective Assessment - 08/31/18 1322    Subjective   Yes my shoulder and neck hurt    Pertinent History  Pt with CVA 05/19/2017    Patient Stated Goals  improve hand, improve pain    Currently in Pain?  Yes    Pain Score  7     Pain Location  Neck   and shoulder   Pain Orientation  Right    Pain Descriptors / Indicators  Aching;Sore    Pain Type  Acute pain    Pain Onset  More than a month ago    Pain Frequency  Intermittent    Aggravating Factors   walking, laying down at night    Pain Relieving Factors  gabepentin        In supine, gentle joint mobs/soft tissue mobs to neck/R shoulder.    Reviewed proper positioning of RUE in bed, provided handout, and pt returned demo with mod cueing.  Pt demonstrated exercises that she is currently doing (including some PT exercises such as squats, leg lifts, bridges, crunch, etc).  Therapists gave consistent cueing for proper positioning of RUE during all LE exercises as pt tends to hold R shoulder in IR, have poor posture.  Pt was also doing wall slides with shoulder in IR and with leaning in at end range.  Therapist emphasized proper positioning with exercises, and recommended against/cautioned pt to avoid weights and "crunch" type exercises to decr risk of injury.  Also recommended pt avoid over-doing exercise and position RUE in neutral position vs. IR when sitting, emphasized importance of good posture.  Pt verbalized understanding, but  will need reinforcement.  Recommended trial of sweeping (simulated with cane).          OT Education - 08/31/18 1529    Education Details  Bed positioning for RUE when laying in supine; Proper positioning of RUE during exercise    Person(s) Educated  Patient    Methods  Explanation;Demonstration;Verbal cues;Handout    Comprehension  Verbalized understanding;Returned demonstration;Need further instruction       OT Short Term Goals - 08/27/18 1553      OT SHORT TERM GOAL #1   Title  Pt/family independent with updated HEP for RUE-- check STGs 08/28/18    Time  4    Period  Weeks    Status  New      OT SHORT TERM GOAL #2   Title  Pt will report R shoulder/neck pain less than or equal to 5/10 for functional  tasks.    Time  4    Period  Weeks    Status  New      OT SHORT TERM GOAL #3   Title  Pt will verbalize understanding of proper positioning of RUE to decr pain.    Time  4    Period  Weeks    Status  New      OT SHORT TERM GOAL #4   Title  ---      OT SHORT TERM GOAL #5   Title  --        OT Long Term Goals - 08/27/18 1553      OT LONG TERM GOAL #1   Title  Pt will perform simple home maintenance tasks mod I incorporating RUE as stabilizer or using hand-over-hand techniques when able.--check LTGs 09/28/18    Time  8    Period  Weeks    Status  New      OT LONG TERM GOAL #2   Title  Pt will report R shoulder/neck pain less than or equal to 3/10 for ADLs.    Time  8    Period  Weeks    Status  New      OT LONG TERM GOAL #3   Title  --      OT LONG TERM GOAL #4   Title  --      OT LONG TERM GOAL #5   Title  --            Plan - 08/31/18 1530    Clinical Impression Statement  Pt is progressing towards goals.  Pt verbalized understanding of improved positioning of RUE to decr pain, but will need reinforcement.    Occupational performance deficits (Please refer to evaluation for details):  ADL's;IADL's;Leisure;Work;Social Participation    Rehab Potential  Fair    OT Frequency  2x / week    OT Duration  4 weeks    OT Treatment/Interventions  Self-care/ADL training;Electrical Stimulation;Therapeutic exercise;Patient/family education;Splinting;Neuromuscular education;Paraffin;Moist Heat;Fluidtherapy;Energy conservation;Therapist, nutritional;Therapeutic activities;Cryotherapy;Ultrasound;DME and/or AE instruction;Manual Therapy;Passive range of motion;Cognitive remediation/compensation;Balance training;Aquatic Therapy    Plan  pain reduction strategies for RUE,  continue to reinforce positioning of RUE with sleeping, ambulation, and exercise; try sweeping with BUEs or reaching hand-over-hand    Consulted and Agree with Plan of Care  Patient       Patient will  benefit from skilled therapeutic intervention in order to improve the following deficits and impairments:     Visit Diagnosis: Hemiplegia and hemiparesis following cerebral infarction affecting right dominant side ([facility])  Other disturbances of skin sensation  Chronic right shoulder pain  Other symptoms and signs involving the nervous system  Unsteadiness on feet  Other abnormalities of gait and mobility    Problem List Patient Active Problem List   Diagnosis Date Noted  . Difficulty coping with disease 03/31/2018  . Hemiparesis of right dominant side as late effect of cerebral infarction ([facility]) 09/17/2017  . Cardioembolic stroke ([facility]) 18/55/0158  . 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 Cardiomyopathy ([facility]) 09/17/2017  . H/O ischemic left MCA stroke 09/17/2017  . Cardiac LV ejection fraction 21-30%   . Graves disease   . Plaque psoriasis   . Neuropathy 05/19/2017    [facility] 08/31/2018, 3:36 PM  [facility] [address] [place], [place], [address] Phone: [phone]   Fax:  [phone]  Name: Esperansa Sarabia MRN: [id number] Date of Birth: 02/19/1961   Vianne Bulls, OTR/L [facility] [address] [place], [place]  [address] [phone] phone [phone] 08/31/18 3:36 PM

## 2018-09-01 NOTE — Therapy (Signed)
[facility] [address] [place], [place], [address] Phone: [phone]   Fax:  [phone]  Physical Therapy Treatment  Patient Details  Name: Margaret Ortiz MRN: [id number] Date of Birth: May 10, 1961 Referring Provider (PT): Metta Clines, DO   Encounter Date: 08/31/2018  PT End of Session - 08/31/18 1434    Visit Number  4    Number of Visits  8    Date for PT Re-Evaluation  09/28/18    Authorization Type  BCBS    PT Start Time  1406    PT Stop Time  1445    PT Time Calculation (min)  39 min    Activity Tolerance  Patient tolerated treatment well    Behavior During Therapy  [facility] for tasks assessed/performed       Past Medical History:  Diagnosis Date  . Aphasia 09/17/2017  . 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  . Cardiac LV ejection fraction 21-30%   . Cardioembolic stroke ([place]) 65/99/3570   Right hemiparesis  . Cardiomyopathy ([place]) 04/2017  . Dysphagia 05/19/2017   Last known diet upgraded to regular diet with thin liquids on 06/27/2017, no records available  . 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 Fall 09/17/2017  . Graves disease   . H/O ischemic left MCA stroke 09/17/2017  . Hemiparesis of right dominant side as late effect of cerebral infarction ([place]) 09/17/2017  . Hyperlipidemia   . Hypertension   . Neuropathy 05/19/2017   s/p stroke  . Plaque psoriasis   . Shoulder subluxation, right, initial encounter 09/17/2017  . Weakness 09/17/2017    Past Surgical History:  Procedure Laterality Date  . NO PAST SURGERIES      There were no vitals filed for this visit.  Subjective Assessment - 08/31/18 1409    Subjective  The patient notes some pain today in right neck and shoulder.    Patient Stated Goals  get rid of brace, use exercise equipment.  She wants to do floor exercises.    Currently in  Pain?  Yes    Pain Score  7     Pain Location  Neck    Pain Orientation  Right    Pain Descriptors / Indicators  Aching;Sore    Pain Type  Acute pain    Pain Onset  More than a month ago    Pain Frequency  Intermittent    Aggravating Factors   walking, laying down    Pain Relieving Factors  gabapentin         [facility] PT Assessment - 08/31/18 1442      Transfers   Transfers  Sit to Stand;Stand to Sit      Ambulation/Gait   Ambulation/Gait  Yes      Berg Balance Test   Sit to Stand  Able to stand without using hands and stabilize independently    Standing Unsupported  Able to stand safely 2 minutes    Sitting with Back Unsupported but Feet Supported on Floor or Stool  Able to sit safely and securely 2 minutes    Stand to Sit  Sits safely with minimal use of hands    Transfers  Able to transfer safely, minor use of hands    Standing Unsupported with Eyes Closed  Able to stand 10 seconds safely    Standing Unsupported with Feet Together  --  From Standing Position, Pick up Object from Floor  --    Merrilee Jansky comment:  *Began to score Merrilee Jansky for STGs, however patient then began inquiring about psychologist-- PT to complete at upcoming session.                   [facility] [facility] Adult PT Treatment/Exercise - 08/31/18 1442      Transfers   Sit to Stand  6: Modified independent (Device/Increase time)    Stand to Sit  6: Modified independent (Device/Increase time)    Comments  Sit<>stand from a low box working on deeper squat before sitting.        Ambulation/Gait   Ambulation/Gait Assistance  6: Modified independent (Device/Increase time);5: Supervision    Ambulation/Gait Assistance Details  Patient is mod indep walking in with [facility] and then supervision without device (although no loss of balance)    Ambulation Distance (Feet)  300 Feet    Assistive device  None    Ambulation Surface  Level;Indoor      Self-Care   Self-Care  Other Self-Care Comments    Other Self-Care Comments    Near end of session patient spoke about a friend in [place] that is a sports psychologist and wanted to know if we have them here.  PT discussed that there is access to neuropsychologist at Dr. Dianna Limbo office.  We discussed that her aphasia and speech impairments may limit her interaction for counseling, however I can check with speech therapy.  When asked what her goal for psychology is, she stated "positive message".  PT inquired if she felt like she was getting the right messages from therapy.  She states "in your therapy, yes."  Due to aphasia, PT is uncertain if this is all accurate.  Plan to check in with ST to determine if we should pursue neuropsychology.      Exercises   Exercises  Other Exercises    Other Exercises   Step ups x 10 reps R and L sides anteriorly.  R lateral step up x 10 reps with on eUE support.  Stepping up and down a curb with supervision without UE support.  Attempted standing hip hike/depression in R leg stance/ unable to isolate.  Performed R leg stance with L leg near a wall and performed hip abduction to get co-contraction of R hip.                 PT Short Term Goals - 08/31/18 1442      PT SHORT TERM GOAL #1   Title  The patient will be able to perform HEP mod indep for LE strength (ankle isolated control, hamstring, hip extensors).    Time  4    Period  Weeks    Target Date  08/29/18      PT SHORT TERM GOAL #2   Title  The patient will improve Berg score from 44/56 to > or equal to 48/56 to demo improving balance.    Time  4    Period  Weeks    Target Date  08/29/18      PT SHORT TERM GOAL #3   Title  The patient will ambulate 30 minutes with PT to observe for postural asymmetry that may be increasing shoulder/neck pain on R side *possible that shorter duration may help improve mechanics-- she may be getting worsening mechanics with fatigue.    Time  4    Period  Weeks    Target Date  08/29/18  PT Long Term Goals - 07/30/18 1351       PT LONG TERM GOAL #1   Title  The patient will negotiate a curb mod indep.    Time  8    Period  Weeks    Target Date  09/28/18      PT LONG TERM GOAL #2   Title  The patient will move floor<>stand with mod indep using L UE support on surface.      PT LONG TERM GOAL #3   Title  The patient will improve gait speed without device from 1.42 ft/sec to > or equal to 1.8 ft/sec to demo improving balance/stability without a device.    Time  8    Period  Weeks    Target Date  09/28/18            Plan - 08/31/18 1449    Clinical Impression Statement  The patient and PT did not complete STG check today.  PT began Merrilee Jansky, however patient wanted to discuss referral to psychology (see self care).  PT to begin going through each exercise to ensure correct performance.    PT Treatment/Interventions  ADLs/Self Care Home Management;Therapeutic activities;Therapeutic exercise;Neuromuscular re-education;Balance training;Gait training;Stair training;Functional mobility training;Patient/family education;Manual techniques    PT Next Visit Plan  CHECK STGs, review prior HEP, floor transfers    Consulted and Agree with Plan of Care  Patient       Patient will benefit from skilled therapeutic intervention in order to improve the following deficits and impairments:  Abnormal gait, Decreased balance, Decreased strength, Postural dysfunction, Impaired flexibility, Pain  Visit Diagnosis: Other symptoms and signs involving the nervous system  Unsteadiness on feet  Other abnormalities of gait and mobility     Problem List Patient Active Problem List   Diagnosis Date Noted  . Difficulty coping with disease 03/31/2018  . Hemiparesis of right dominant side as late effect of cerebral infarction ([place]) 09/17/2017  . Cardioembolic stroke ([place]) 77/82/4235  . 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 Cardiomyopathy ([place]) 09/17/2017  . H/O ischemic left MCA stroke 09/17/2017   . Cardiac LV ejection fraction 21-30%   . Graves disease   . Plaque psoriasis   . Neuropathy 05/19/2017    Louellen Haldeman, PT 09/01/2018, 9:42 PM  [facility] [address] [place], [place], [address] Phone: [phone]   Fax:  [phone]  Name: Sharyah Bostwick MRN: [id number] Date of Birth: [birth year]

## 2018-09-02 ENCOUNTER — Ambulatory Visit: Payer: BLUE CROSS/BLUE SHIELD | Admitting: Occupational Therapy

## 2018-09-02 ENCOUNTER — Encounter: Payer: Self-pay | Admitting: Occupational Therapy

## 2018-09-02 DIAGNOSIS — R2681 Unsteadiness on feet: Secondary | ICD-10-CM

## 2018-09-02 DIAGNOSIS — G8929 Other chronic pain: Secondary | ICD-10-CM

## 2018-09-02 DIAGNOSIS — M6281 Muscle weakness (generalized): Secondary | ICD-10-CM | POA: Diagnosis not present

## 2018-09-02 DIAGNOSIS — I69351 Hemiplegia and hemiparesis following cerebral infarction affecting right dominant side: Secondary | ICD-10-CM

## 2018-09-02 DIAGNOSIS — M25511 Pain in right shoulder: Secondary | ICD-10-CM

## 2018-09-02 DIAGNOSIS — R29818 Other symptoms and signs involving the nervous system: Secondary | ICD-10-CM

## 2018-09-02 NOTE — Therapy (Signed)
[facility] [address] [place], [place], [address] Phone: [phone]   Fax:  [phone]  Occupational Therapy Treatment  Patient Details  Name: Margaret Ortiz MRN: [id number] Date of Birth: 10/16/1960 Referring Provider (OT): Dr. Metta Clines   Encounter Date: 09/02/2018  OT End of Session - 09/02/18 1453    Visit Number  6    Number of Visits  12    Date for OT Re-Evaluation  09/28/18    Authorization Type  BCBS--no visit limt    OT Start Time  1322    OT Stop Time  1401    OT Time Calculation (min)  39 min    Activity Tolerance  Patient tolerated treatment well    Behavior During Therapy  [facility] for tasks assessed/performed       Past Medical History:  Diagnosis Date  . Aphasia 09/17/2017  . 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  . Cardiac LV ejection fraction 21-30%   . Cardioembolic stroke ([place]) 44/08/4740   Right hemiparesis  . Cardiomyopathy ([place]) 04/2017  . Dysphagia 05/19/2017   Last known diet upgraded to regular diet with thin liquids on 06/27/2017, no records available  . 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 Fall 09/17/2017  . Graves disease   . H/O ischemic left MCA stroke 09/17/2017  . Hemiparesis of right dominant side as late effect of cerebral infarction ([place]) 09/17/2017  . Hyperlipidemia   . Hypertension   . Neuropathy 05/19/2017   s/p stroke  . Plaque psoriasis   . Shoulder subluxation, right, initial encounter 09/17/2017  . Weakness 09/17/2017    Past Surgical History:  Procedure Laterality Date  . NO PAST SURGERIES      There were no vitals filed for this visit.  Subjective Assessment - 09/02/18 1327    Subjective   Pt reports using new bed positioning has decreased pain in neck and shoulder     Pertinent History  Pt with CVA 05/19/2017    Patient Stated Goals  improve hand,  improve pain    Currently in Pain?  No/denies           Pt gave permission to contact sister/brother-in-law regarding aquatic therapy. Contacted and spoke with brother-in-law regarding pt needing direct supervision and family member with her when attending aquatic therapy for increased safety while at facility. Brother-in-law to have sister confirm available supervision and will call back.   With min cues pt able to return demonstrate setup and proper bed positioning in supine including RUE positioning for reduced pain of neck/shoulder and for increased support of shoulder joint.  Pt engaged in seated activity AAROM to RUE using tilted stool for shoulder and elbow extension flexion/extension. Pt requiring mod facilitation and mod cues to reduce compensatory movement patterns with activity. Pt engaged in seated reaching activity holding large lightwt ball with bil UEs; pt requiring mod facilitation to maintain R grip on ball, practiced forward reach/rolling. Pt with difficulty maintaining grip with R hand due to increased spasticity in digits, transitioned to hand over hand using LUE to support RUE on top of ball rolling forward/back for continued RUE ROM. Educated pt to perform gentle, slow stretching to R hand as pt attempting to perform quick stretch to release spasticity, pt verbalizing understanding.   Pt practicing sweeping using broom in standing with minA and mod cues for maintaining good RUE and postural  alignment. Pt with good carryover of instruction and improvements noted in body mechanics with activity.                 OT Education - 09/02/18 1452    Education Details  reviewed bed positioning for RUE when in supine; continued education of RUE during exercise and functional activity; educated in slow, gentle stretch to R digits vs quick movements when trying to move them     Person(s) Educated  Patient    Methods  Explanation;Demonstration;Verbal cues    Comprehension   Verbalized understanding;Returned demonstration;Need further instruction       OT Short Term Goals - 08/27/18 1553      OT SHORT TERM GOAL #1   Title  Pt/family independent with updated HEP for RUE-- check STGs 08/28/18    Time  4    Period  Weeks    Status  New      OT SHORT TERM GOAL #2   Title  Pt will report R shoulder/neck pain less than or equal to 5/10 for functional tasks.    Time  4    Period  Weeks    Status  New      OT SHORT TERM GOAL #3   Title  Pt will verbalize understanding of proper positioning of RUE to decr pain.    Time  4    Period  Weeks    Status  New      OT SHORT TERM GOAL #4   Title  ---      OT SHORT TERM GOAL #5   Title  --        OT Long Term Goals - 08/27/18 1553      OT LONG TERM GOAL #1   Title  Pt will perform simple home maintenance tasks mod I incorporating RUE as stabilizer or using hand-over-hand techniques when able.--check LTGs 09/28/18    Time  8    Period  Weeks    Status  New      OT LONG TERM GOAL #2   Title  Pt will report R shoulder/neck pain less than or equal to 3/10 for ADLs.    Time  8    Period  Weeks    Status  New      OT LONG TERM GOAL #3   Title  --      OT LONG TERM GOAL #4   Title  --      OT LONG TERM GOAL #5   Title  --            Plan - 09/02/18 1455    Clinical Impression Statement  Pt progressing towards OT goals, continues to require cues for proper positioning of RUE with activity, though improvements noted including improved carryover of RUE positioning when supine in bed    Occupational Profile and client history currently impacting functional performance  Pt was independent prior to CVA and living in [place], [place].  Pt currently living with sister/brother-in-law and is not participating in household tasks, unable to work, drive, or live alone safely.    Occupational performance deficits (Please refer to evaluation for details):  ADL's;IADL's;Leisure;Work;Social Participation    Body  Structure / Function / Physical Skills  ADL;Balance;Body mechanics;Pain;ROM;UE functional use;Tone;IADL;GMC;FMC;Coordination;Sensation    Rehab Potential  Fair    OT Frequency  2x / week    OT Duration  4 weeks    OT Treatment/Interventions  Self-care/ADL training;Electrical Stimulation;Therapeutic exercise;Patient/family education;Splinting;Neuromuscular education;Paraffin;Moist Heat;Fluidtherapy;Energy conservation;Functional  Mobility Training;Therapeutic activities;Cryotherapy;Ultrasound;DME and/or AE instruction;Manual Therapy;Passive range of motion;Cognitive remediation/compensation;Balance training;Aquatic Therapy    Plan  please check STGs; pain reduction strategies for RUE,  continue to reinforce positioning of RUE with sleeping, ambulation, and exercise; try sweeping with BUEs or reaching hand-over-hand    Consulted and Agree with Plan of Care  Patient       Patient will benefit from skilled therapeutic intervention in order to improve the following deficits and impairments:  Body Structure / Function / Physical Skills  Visit Diagnosis: Hemiplegia and hemiparesis following cerebral infarction affecting right dominant side (HCC)  Chronic right shoulder pain  Other symptoms and signs involving the nervous system  Unsteadiness on feet  Muscle weakness (generalized)    Problem List Patient Active Problem List   Diagnosis Date Noted  . Difficulty coping with disease 03/31/2018  . Hemiparesis of right dominant side as late effect of cerebral infarction ([place]) 09/17/2017  . Cardioembolic stroke ([place]) 03/54/6568  . 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 Cardiomyopathy ([place]) 09/17/2017  . H/O ischemic left MCA stroke 09/17/2017  . Cardiac LV ejection fraction 21-30%   . Graves disease   . Plaque psoriasis   . Neuropathy 05/19/2017    Raymondo Band, OTR/L 09/02/2018, 3:29 PM  [facility] [address] [place], [place], [address] Phone: [phone]   Fax:  [phone]  Name: Margaret Ortiz MRN: [id number] Date of Birth: [birth year]

## 2018-09-07 ENCOUNTER — Ambulatory Visit: Payer: BLUE CROSS/BLUE SHIELD | Admitting: Occupational Therapy

## 2018-09-07 ENCOUNTER — Encounter: Payer: Self-pay | Admitting: Occupational Therapy

## 2018-09-07 ENCOUNTER — Other Ambulatory Visit: Payer: Self-pay

## 2018-09-07 DIAGNOSIS — R29818 Other symptoms and signs involving the nervous system: Secondary | ICD-10-CM

## 2018-09-07 DIAGNOSIS — R2689 Other abnormalities of gait and mobility: Secondary | ICD-10-CM

## 2018-09-07 DIAGNOSIS — R208 Other disturbances of skin sensation: Secondary | ICD-10-CM

## 2018-09-07 DIAGNOSIS — M6281 Muscle weakness (generalized): Secondary | ICD-10-CM

## 2018-09-07 DIAGNOSIS — M25511 Pain in right shoulder: Secondary | ICD-10-CM

## 2018-09-07 DIAGNOSIS — I69318 Other symptoms and signs involving cognitive functions following cerebral infarction: Secondary | ICD-10-CM

## 2018-09-07 DIAGNOSIS — R278 Other lack of coordination: Secondary | ICD-10-CM

## 2018-09-07 DIAGNOSIS — R2681 Unsteadiness on feet: Secondary | ICD-10-CM

## 2018-09-07 DIAGNOSIS — G8929 Other chronic pain: Secondary | ICD-10-CM

## 2018-09-07 DIAGNOSIS — R41842 Visuospatial deficit: Secondary | ICD-10-CM

## 2018-09-07 DIAGNOSIS — I69351 Hemiplegia and hemiparesis following cerebral infarction affecting right dominant side: Secondary | ICD-10-CM

## 2018-09-07 NOTE — Therapy (Signed)
[facility] [address] [place], [place], [address] Phone: [phone]   Fax:  [phone]  Occupational Therapy Treatment  Patient Details  Name: Margaret Ortiz MRN: [id number] Date of Birth: [birth year] Referring Provider (OT): Dr. Metta Clines   Encounter Date: 09/07/2018  OT End of Session - 09/07/18 1328    Visit Number  7    Number of Visits  12    Date for OT Re-Evaluation  09/28/18    Authorization Type  BCBS--no visit limt    OT Start Time  1320    OT Stop Time  1405    OT Time Calculation (min)  45 min    Activity Tolerance  Patient tolerated treatment well    Behavior During Therapy  [facility] for tasks assessed/performed       Past Medical History:  Diagnosis Date  . Aphasia 09/17/2017  . 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  . Cardiac LV ejection fraction 21-30%   . Cardioembolic stroke ([place]) 96/28/3662   Right hemiparesis  . Cardiomyopathy ([place]) 04/2017  . Dysphagia 05/19/2017   Last known diet upgraded to regular diet with thin liquids on 06/27/2017, no records available  . 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 Fall 09/17/2017  . Graves disease   . H/O ischemic left MCA stroke 09/17/2017  . Hemiparesis of right dominant side as late effect of cerebral infarction ([place]) 09/17/2017  . Hyperlipidemia   . Hypertension   . Neuropathy 05/19/2017   s/p stroke  . Plaque psoriasis   . Shoulder subluxation, right, initial encounter 09/17/2017  . Weakness 09/17/2017    Past Surgical History:  Procedure Laterality Date  . NO PAST SURGERIES      There were no vitals filed for this visit.  Subjective Assessment - 09/07/18 1323    Subjective   Pt reports side pain for last 3-4 days    Pertinent History  Pt with CVA 05/19/2017    Patient Stated Goals  improve hand, improve pain    Currently in Pain?   Yes    Pain Score  6     Pain Location  --   R side   Pain Orientation  Right    Pain Descriptors / Indicators  --   unable to describe   Pain Type  Acute pain    Aggravating Factors   changing sleeping position    Pain Relieving Factors  unknown         With pt permission/request, used pt's cell phone to call brother-in-law/sister and spoke to sister regarding aquatic therapy to confirm appt 09/15/18.  Sister agreed and stated that she will be able to stay for entire appt time.  Reviewed proper positioning of RUE in bed, with sitting, and LE exercises.  Pt educated in activities for home to try to incorporate RUE as stabilizer/gross assist including:  Sweeping, holding bottle while opening with other hand, bathing, and then with supported reach (pt able to support forearm with LUE for RUE reach and place hand around bottle and initiate thumb movement to grasp without assist at hand, pt cued to only support at forearm and then give hand time to move thumb/fingers around bottle--improved after initial cueing/instruction), holding large bowl against body with RUE to stir with LUE with min cueing initially, place hand on table when eating or placing hand on counter when brushing teeth for  incr awareness/light weight bearing and improved positioning.  Emphasized importance of trying to incorporate RUE into functional daily activities as neuro re-ed and that it is as important as exercise.  Also educated pt that should avoid overdoing it to decr risk of pain/further complications.  Pt verbalized understanding and states that she is pleased with session.         OT Short Term Goals - 09/07/18 1559      OT SHORT TERM GOAL #1   Title  Pt/family independent with updated HEP for RUE-- check STGs 08/28/18    Time  4    Period  Weeks    Status  On-going      OT SHORT TERM GOAL #2   Title  Pt will report R shoulder/neck pain less than or equal to 5/10 for functional tasks.    Time  4    Period   Weeks    Status  On-going      OT SHORT TERM GOAL #3   Title  Pt will verbalize understanding of proper positioning of RUE to decr pain.    Time  4    Period  Weeks    Status  On-going   09/07/18:  educated, but would benefit from reinforcement     OT SHORT TERM GOAL #4   Title  ---      OT SHORT TERM GOAL #5   Title  --        OT Long Term Goals - 08/27/18 1553      OT LONG TERM GOAL #1   Title  Pt will perform simple home maintenance tasks mod I incorporating RUE as stabilizer or using hand-over-hand techniques when able.--check LTGs 09/28/18    Time  8    Period  Weeks    Status  New      OT LONG TERM GOAL #2   Title  Pt will report R shoulder/neck pain less than or equal to 3/10 for ADLs.    Time  8    Period  Weeks    Status  New      OT LONG TERM GOAL #3   Title  --      OT LONG TERM GOAL #4   Title  --      OT LONG TERM GOAL #5   Title  --            Plan - 09/07/18 1558    Clinical Impression Statement  Pt is progressing towards goals for improved RUE functional use and positioning.  Pt reports that she is trying to position RUE as she was educated.  Pt reports improved R shoulder pain, but did report some R side pain today.    Occupational Profile and client history currently impacting functional performance  Pt was independent prior to CVA and living in [place], [place].  Pt currently living with sister/brother-in-law and is not participating in household tasks, unable to work, drive, or live alone safely.    Occupational performance deficits (Please refer to evaluation for details):  ADL's;IADL's;Leisure;Work;Social Participation    Body Structure / Function / Physical Skills  ADL;Balance;Body mechanics;Pain;ROM;UE functional use;Tone;IADL;GMC;FMC;Coordination;Sensation    Rehab Potential  Fair    OT Frequency  2x / week    OT Duration  4 weeks    OT Treatment/Interventions  Self-care/ADL training;Electrical Stimulation;Therapeutic  exercise;Patient/family education;Splinting;Neuromuscular education;Paraffin;Moist Heat;Fluidtherapy;Energy conservation;Therapist, nutritional;Therapeutic activities;Cryotherapy;Ultrasound;DME and/or AE instruction;Manual Therapy;Passive range of motion;Cognitive remediation/compensation;Balance training;Aquatic Therapy    Plan  continue to  reinforce proper positioning of RUE, assess pain, review ways to incorporate RUE into functional task and updates to HEP (check STGs)    Consulted and Agree with Plan of Care  Patient       Patient will benefit from skilled therapeutic intervention in order to improve the following deficits and impairments:  Body Structure / Function / Physical Skills  Visit Diagnosis: Hemiplegia and hemiparesis following cerebral infarction affecting right dominant side (HCC)  Other symptoms and signs involving the nervous system  Unsteadiness on feet  Chronic right shoulder pain  Muscle weakness (generalized)  Other abnormalities of gait and mobility  Other disturbances of skin sensation  Visuospatial deficit  Other symptoms and signs involving cognitive functions following cerebral infarction  Other lack of coordination    Problem List Patient Active Problem List   Diagnosis Date Noted  . Difficulty coping with disease 03/31/2018  . Hemiparesis of right dominant side as late effect of cerebral infarction ([place]) 09/17/2017  . Cardioembolic stroke ([place]) 82/95/6213  . 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 Cardiomyopathy ([place]) 09/17/2017  . H/O ischemic left MCA stroke 09/17/2017  . Cardiac LV ejection fraction 21-30%   . Graves disease   . Plaque psoriasis   . Neuropathy 05/19/2017    [facility] 09/07/2018, 4:02 PM  [facility] [address] [place], [place], [address] Phone: [phone]   Fax:  [phone]  Name: Daila Elbert MRN:  [id number] Date of Birth: [birth year]   Vianne Bulls, OTR/L [facility] [address] [place], [place]  [address] [phone] phone [phone] 09/07/18 4:02 PM

## 2018-09-08 ENCOUNTER — Ambulatory Visit: Payer: BLUE CROSS/BLUE SHIELD | Admitting: Physical Medicine & Rehabilitation

## 2018-09-09 ENCOUNTER — Ambulatory Visit: Payer: BLUE CROSS/BLUE SHIELD | Admitting: Rehabilitative and Restorative Service Providers"

## 2018-09-09 ENCOUNTER — Encounter: Payer: Self-pay | Admitting: Family Medicine

## 2018-09-09 ENCOUNTER — Ambulatory Visit: Payer: BLUE CROSS/BLUE SHIELD | Admitting: Occupational Therapy

## 2018-09-09 ENCOUNTER — Encounter: Payer: Self-pay | Admitting: Rehabilitative and Restorative Service Providers"

## 2018-09-09 DIAGNOSIS — R41842 Visuospatial deficit: Secondary | ICD-10-CM

## 2018-09-09 DIAGNOSIS — M6281 Muscle weakness (generalized): Secondary | ICD-10-CM

## 2018-09-09 DIAGNOSIS — R208 Other disturbances of skin sensation: Secondary | ICD-10-CM

## 2018-09-09 DIAGNOSIS — I69318 Other symptoms and signs involving cognitive functions following cerebral infarction: Secondary | ICD-10-CM

## 2018-09-09 DIAGNOSIS — R29818 Other symptoms and signs involving the nervous system: Secondary | ICD-10-CM

## 2018-09-09 DIAGNOSIS — R278 Other lack of coordination: Secondary | ICD-10-CM

## 2018-09-09 DIAGNOSIS — R2681 Unsteadiness on feet: Secondary | ICD-10-CM

## 2018-09-09 DIAGNOSIS — I69351 Hemiplegia and hemiparesis following cerebral infarction affecting right dominant side: Secondary | ICD-10-CM

## 2018-09-09 DIAGNOSIS — R2689 Other abnormalities of gait and mobility: Secondary | ICD-10-CM

## 2018-09-09 NOTE — Patient Instructions (Addendum)
Aquatic therapy is cancelled indefinitely at this time due to corona virus precautions. [name] will resume land therapies in April. We will re-assess at that time to see when the pool is reopening and will consider scheduling aquatic therapy at that time.    Home exercises to perform  Place both hands on top of your cane, push cane forwards and backwards gently 10 reps   Standing at tabletop push ball or towel  forwards and backwards slowly along tabletop 10 reps  Use your right hand to hold toothbrush while putting toothpaste on it,  Hold a small bottle on the table while opening with left hand

## 2018-09-09 NOTE — Patient Instructions (Signed)
Access Code: [email or web address]  URL: [email or web address]  Date: 09/09/2018  Prepared by: Rudell Cobb   Exercises Heel rises with counter support - 10 reps - 1 sets - 2x daily - 7x weekly Standing Gastroc Stretch - 3 reps - 1 sets - 30 seconds hold - 2x daily - 7x weekly Wall Squat - 10 reps - 1 sets - 2x daily - 7x weekly Bridge with Hip Abduction and Resistance - 10 reps - 1 sets - 2x daily - 7x weekly Seated Heel Slide - 10 reps - 2 sets - 1x daily - 7x weekly Single Leg Stance - 3 reps - 1 sets - 10 seconds hold - 1x daily - 7x weekly Supine March - 10 reps - 2 sets - 1x daily - 7x weekly

## 2018-09-10 NOTE — Therapy (Signed)
[facility] [address] [place], [place], [address] Phone: [phone]   Fax:  [phone]  Physical Therapy Treatment  Patient Details  Name: Margaret Ortiz MRN: [id number] Date of Birth: February 03, 1961 Referring Provider (PT): Metta Clines, DO   Encounter Date: 09/09/2018  PT End of Session - 09/09/18 1318    Visit Number  5    Number of Visits  8    Date for PT Re-Evaluation  09/28/18    Authorization Type  BCBS    PT Start Time  1320    PT Stop Time  1400    PT Time Calculation (min)  40 min    Activity Tolerance  Patient tolerated treatment well    Behavior During Therapy  [facility] for tasks assessed/performed       Past Medical History:  Diagnosis Date  . Aphasia 09/17/2017  . 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  . Cardiac LV ejection fraction 21-30%   . Cardioembolic stroke ([place]) 62/83/6629   Right hemiparesis  . Cardiomyopathy ([place]) 04/2017  . Dysphagia 05/19/2017   Last known diet upgraded to regular diet with thin liquids on 06/27/2017, no records available  . 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 Fall 09/17/2017  . Graves disease   . H/O ischemic left MCA stroke 09/17/2017  . Hemiparesis of right dominant side as late effect of cerebral infarction ([place]) 09/17/2017  . Hyperlipidemia   . Hypertension   . Neuropathy 05/19/2017   s/p stroke  . Plaque psoriasis   . Shoulder subluxation, right, initial encounter 09/17/2017  . Weakness 09/17/2017    Past Surgical History:  Procedure Laterality Date  . NO PAST SURGERIES      There were no vitals filed for this visit.  Subjective Assessment - 09/09/18 1318    Subjective  The patient arrives inquiring about cancelling visits x 2-3 weeks due to coronavirus and living with her 58 year old mother.    Patient Stated Goals  get rid of brace, use exercise  equipment.  She wants to do floor exercises.    Currently in Pain?  Yes         [facility] PT Assessment - 09/09/18 1320      Ambulation/Gait   Ambulation/Gait  Yes      Standardized Balance Assessment   Standardized Balance Assessment  Berg Balance Test      Berg Balance Test   Sit to Stand  Able to stand without using hands and stabilize independently    Standing Unsupported  Able to stand safely 2 minutes    Sitting with Back Unsupported but Feet Supported on Floor or Stool  Able to sit safely and securely 2 minutes    Stand to Sit  Sits safely with minimal use of hands    Transfers  Able to transfer safely, minor use of hands    Standing Unsupported with Eyes Closed  Able to stand 10 seconds safely    Standing Unsupported with Feet Together  Able to place feet together independently and stand 1 minute safely    From Standing, Reach Forward with Outstretched Arm  Can reach forward >12 cm safely (5")    From Standing Position, Pick up Object from Floor  Able to pick up shoe safely and easily    From Standing Position, Turn to Look Behind Over each Shoulder  Looks behind one side  only/other side shows less weight shift    Turn 360 Degrees  Able to turn 360 degrees safely but slowly    Standing Unsupported, Alternately Place Feet on Step/Stool  Able to complete >2 steps/needs minimal assist    Standing Unsupported, One Foot in Front  Able to take small step independently and hold 30 seconds    Standing on One Leg  Tries to lift leg/unable to hold 3 seconds but remains standing independently    Total Score  44    Berg comment:  44/56                   [facility] Adult PT Treatment/Exercise - 09/09/18 1320      Ambulation/Gait   Ambulation/Gait Assistance  6: Modified independent (Device/Increase time)    Ambulation/Gait Assistance Details  Mod indep walking into clinic with cane and in clinic without device for short distances.    Ambulation Distance (Feet)  100 Feet    x 2  reps, 75 ft x 3 in clinic without device   Assistive device  None;Straight cane    Gait Pattern  Decreased stance time - right;Decreased step length - left;Decreased arm swing - right;Step-through pattern;Decreased hip/knee flexion - right;Right circumduction    Ambulation Surface  Level;Indoor      Self-Care   Self-Care  Other Self-Care Comments    Other Self-Care Comments   PT followed up on prior visit conversation regarding referral to neuropsychologist.  She reports that she is not "sick" in her head, but wants her right side to move.  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  She declined referral to neuropsych at this time.      Neuro Re-ed    Neuro Re-ed Details   single leg stance activities without AFO on for ankle control near support surface for safety.      Exercises   Exercises  Other Exercises    Other Exercises   *See updated HEP * reviewed due to patient holding PT at this time due to coronavirus.             PT Education - 09/10/18 0909    Education Details  reviewed HEP     Person(s) Educated  Patient    Methods  Explanation;Demonstration;Handout    Comprehension  Verbalized understanding;Returned demonstration       PT Short Term Goals - 09/09/18 1334      PT SHORT TERM GOAL #1   Title  The patient will be able to perform HEP mod indep for LE strength (ankle isolated control, hamstring, hip extensors).    Time  4    Period  Weeks    Status  Achieved    Target Date  08/29/18      PT SHORT TERM GOAL #2   Title  The patient will improve Berg score from 44/56 to > or equal to 48/56 to demo improving balance.    Baseline  remains at 44/56.    Time  4    Period  Weeks    Status  Not Met    Target Date  08/29/18      PT SHORT TERM GOAL #3   Title  The patient will ambulate 30 minutes with PT to observe for postural asymmetry that may be increasing shoulder/neck pain on R side *possible that shorter duration may help  improve mechanics-- she may be getting worsening mechanics with fatigue.    Time  4    Period  Weeks    Status  On-going    Target Date  08/29/18        PT Long Term Goals - 07/30/18 1351      PT LONG TERM GOAL #1   Title  The patient will negotiate a curb mod indep.    Time  8    Period  Weeks    Target Date  09/28/18      PT LONG TERM GOAL #2   Title  The patient will move floor<>stand with mod indep using L UE support on surface.      PT LONG TERM GOAL #3   Title  The patient will improve gait speed without device from 1.42 ft/sec to > or equal to 1.8 ft/sec to demo improving balance/stability without a device.    Time  8    Period  Weeks    Target Date  09/28/18            Plan - 09/10/18 0913    Clinical Impression Statement  The patinet has HEP to work on during a "hold" time from PT due to coronavirus.  Patient declined referral to psychologist at this time (a folow-up from last visit's inquiry/discussion).  PT encouraged safe exercises that do not increase shoulder pain and focused on LE stretching, strengthening and mobility.     PT Treatment/Interventions  ADLs/Self Care Home Management;Therapeutic activities;Therapeutic exercise;Neuromuscular re-education;Balance training;Gait training;Stair training;Functional mobility training;Patient/family education;Manual techniques    PT Next Visit Plan  Update goals/ check goals.  work on floor transfers and review HEP.  *Patient on hold--has appointments to return mid april.    Consulted and Agree with Plan of Care  Patient       Patient will benefit from skilled therapeutic intervention in order to improve the following deficits and impairments:  Abnormal gait, Decreased balance, Decreased strength, Postural dysfunction, Impaired flexibility, Pain  Visit Diagnosis: Other symptoms and signs involving the nervous system  Unsteadiness on feet  Muscle weakness (generalized)  Other abnormalities of gait and  mobility     Problem List Patient Active Problem List   Diagnosis Date Noted  . Difficulty coping with disease 03/31/2018  . Hemiparesis of right dominant side as late effect of cerebral infarction ([place]) 09/17/2017  . Cardioembolic stroke ([place]) 64/38/3818  . 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 Cardiomyopathy ([place]) 09/17/2017  . H/O ischemic left MCA stroke 09/17/2017  . Cardiac LV ejection fraction 21-30%   . Graves disease   . Plaque psoriasis   . Neuropathy 05/19/2017    Dymphna Wadley, PT 09/10/2018, 9:15 AM  [facility] [address] [place], [place], [address] Phone: [phone]   Fax:  [phone]  Name: Belanna Manring MRN: [id number] Date of Birth: 24-May-1961

## 2018-09-11 NOTE — Therapy (Signed)
[facility] [address] [place], [place], [address] Phone: [phone]   Fax:  [phone]  Occupational Therapy Treatment  Patient Details  Name: Margaret Ortiz MRN: [id number] Date of Birth: 04-25-1961 Referring Provider (OT): Dr. Metta Clines   Encounter Date: 09/09/2018  OT End of Session - 09/11/18 1228    Visit Number  8    Number of Visits  12    Date for OT Re-Evaluation  09/28/18    Authorization Type  BCBS--no visit limt    OT Start Time  1407    OT Stop Time  1445    OT Time Calculation (min)  38 min    Activity Tolerance  Patient tolerated treatment well    Behavior During Therapy  [facility] for tasks assessed/performed       Past Medical History:  Diagnosis Date  . Aphasia 09/17/2017  . 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  . Cardiac LV ejection fraction 21-30%   . Cardioembolic stroke ([place]) 85/07/7739   Right hemiparesis  . Cardiomyopathy ([place]) 04/2017  . Dysphagia 05/19/2017   Last known diet upgraded to regular diet with thin liquids on 06/27/2017, no records available  . 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 Fall 09/17/2017  . Graves disease   . H/O ischemic left MCA stroke 09/17/2017  . Hemiparesis of right dominant side as late effect of cerebral infarction ([place]) 09/17/2017  . Hyperlipidemia   . Hypertension   . Neuropathy 05/19/2017   s/p stroke  . Plaque psoriasis   . Shoulder subluxation, right, initial encounter 09/17/2017  . Weakness 09/17/2017    Past Surgical History:  Procedure Laterality Date  . NO PAST SURGERIES      There were no vitals filed for this visit.           Treatment: Therapist checked progress towards goals. Therapist  reviewed previously issued HEP with pt and she returned demonstration. Functional activities to incorporate RUE as a stabilizer such as  holding a bottle to open with LUE- see pt instructions. Therapsit reinforced importance of bed postioning and avoiding activities that may harm her shoulder and cause pain.               OT Short Term Goals - 09/09/18 1426      OT SHORT TERM GOAL #1   Title  Pt/family independent with updated HEP for RUE-- check STGs 08/28/18    Status  On-going      OT SHORT TERM GOAL #2   Title  Pt will report R shoulder/neck pain less than or equal to 5/10 for functional tasks.    Status  Achieved   pain is about 5/10 with activity     OT SHORT TERM GOAL #3   Title  Pt will verbalize understanding of proper positioning of RUE to decr pain.    Status  On-going        OT Long Term Goals - 08/27/18 1553      OT LONG TERM GOAL #1   Title  Pt will perform simple home maintenance tasks mod I incorporating RUE as stabilizer or using hand-over-hand techniques when able.--check LTGs 09/28/18    Time  8    Period  Weeks    Status  New      OT LONG TERM GOAL #2   Title  Pt will report R shoulder/neck pain  less than or equal to 3/10 for ADLs.    Time  8    Period  Weeks    Status  New      OT LONG TERM GOAL #3   Title  --      OT LONG TERM GOAL #4   Title  --      OT LONG TERM GOAL #5   Title  --            Plan - 09/11/18 1229    Clinical Impression Statement  Pt is progressing towards goals with improving RUE pain. Pt requests to go on hold at this time due to concerns over the corona virus. Pt to return in several weeks.    Body Structure / Function / Physical Skills  ADL;Balance;Body mechanics;Pain;ROM;UE functional use;Tone;IADL;GMC;FMC;Coordination;Sensation    Rehab Potential  Fair    OT Frequency  2x / week    OT Duration  4 weeks    OT Treatment/Interventions  Self-care/ADL training;Electrical Stimulation;Therapeutic exercise;Patient/family education;Splinting;Neuromuscular education;Paraffin;Moist Heat;Fluidtherapy;Energy conservation;Wellsite geologist;Therapeutic activities;Cryotherapy;Ultrasound;DME and/or AE instruction;Manual Therapy;Passive range of motion;Cognitive remediation/compensation;Balance training;Aquatic Therapy    Plan  work towards long term goals when pt returns     Consulted and Agree with Plan of Care  Patient       Patient will benefit from skilled therapeutic intervention in order to improve the following deficits and impairments:  Body Structure / Function / Physical Skills  Visit Diagnosis: Hemiplegia and hemiparesis following cerebral infarction affecting right dominant side (HCC)  Other symptoms and signs involving the nervous system  Muscle weakness (generalized)  Other disturbances of skin sensation  Visuospatial deficit  Other symptoms and signs involving cognitive functions following cerebral infarction  Other lack of coordination    Problem List Patient Active Problem List   Diagnosis Date Noted  . Difficulty coping with disease 03/31/2018  . Hemiparesis of right dominant side as late effect of cerebral infarction ([place]) 09/17/2017  . Cardioembolic stroke ([place]) 56/38/7564  . 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 Cardiomyopathy ([place]) 09/17/2017  . H/O ischemic left MCA stroke 09/17/2017  . Cardiac LV ejection fraction 21-30%   . Graves disease   . Plaque psoriasis   . Neuropathy 05/19/2017    Margaret Ortiz 09/11/2018, 12:33 PM  [facility] [address] [place], [place], [address] Phone: [phone]   Fax:  [phone]  Name: Margaret Ortiz MRN: [id number] Date of Birth: Oct 27, 1960

## 2018-09-14 ENCOUNTER — Ambulatory Visit: Payer: BLUE CROSS/BLUE SHIELD | Admitting: Rehabilitative and Restorative Service Providers"

## 2018-09-14 ENCOUNTER — Ambulatory Visit: Payer: BLUE CROSS/BLUE SHIELD | Admitting: Occupational Therapy

## 2018-09-16 ENCOUNTER — Ambulatory Visit: Payer: BLUE CROSS/BLUE SHIELD | Admitting: Occupational Therapy

## 2018-09-21 ENCOUNTER — Ambulatory Visit: Payer: Self-pay | Admitting: Rehabilitative and Restorative Service Providers"

## 2018-09-25 ENCOUNTER — Telehealth: Payer: Self-pay | Admitting: Occupational Therapy

## 2018-09-25 NOTE — Telephone Encounter (Signed)
Patient was contacted today regarding temporary reduction of [facility]patient [facility] due to concerns for community transmission of COVID-19.   Therapist left a message for patient. [facility] will follow up with this client when we are able to safely resume care at the [facility] clinic in person Patient is aware we can be reached by telephone during limited business hours in the meantime.  Theone Murdoch, OTR/L [phone] Phone: [phone] 12:29 PM 09/25/18

## 2018-10-05 ENCOUNTER — Ambulatory Visit: Payer: Self-pay | Admitting: Rehabilitative and Restorative Service Providers"

## 2018-10-12 ENCOUNTER — Ambulatory Visit: Payer: Self-pay | Admitting: Rehabilitative and Restorative Service Providers"

## 2018-10-13 ENCOUNTER — Encounter: Payer: Self-pay | Admitting: Occupational Therapy

## 2018-10-19 ENCOUNTER — Ambulatory Visit: Payer: Self-pay | Admitting: Rehabilitative and Restorative Service Providers"

## 2018-10-19 ENCOUNTER — Encounter: Payer: Self-pay | Admitting: Occupational Therapy

## 2018-10-19 ENCOUNTER — Telehealth: Payer: Self-pay | Admitting: *Deleted

## 2018-10-19 NOTE — Telephone Encounter (Signed)
Calling about appointment,

## 2018-10-22 ENCOUNTER — Ambulatory Visit: Payer: BLUE CROSS/BLUE SHIELD

## 2018-10-22 ENCOUNTER — Ambulatory Visit: Payer: BLUE CROSS/BLUE SHIELD | Admitting: Physical Medicine & Rehabilitation

## 2018-10-26 ENCOUNTER — Ambulatory Visit: Payer: Self-pay | Admitting: Rehabilitative and Restorative Service Providers"

## 2018-10-28 ENCOUNTER — Other Ambulatory Visit: Payer: Self-pay

## 2018-10-28 DIAGNOSIS — I5022 Chronic systolic (congestive) heart failure: Secondary | ICD-10-CM

## 2018-11-02 ENCOUNTER — Ambulatory Visit: Payer: Self-pay | Admitting: Rehabilitative and Restorative Service Providers"

## 2018-11-05 ENCOUNTER — Telehealth: Payer: Self-pay

## 2018-11-05 ENCOUNTER — Encounter: Payer: Self-pay | Admitting: Family Medicine

## 2018-11-05 ENCOUNTER — Ambulatory Visit (INDEPENDENT_AMBULATORY_CARE_PROVIDER_SITE_OTHER): Payer: BLUE CROSS/BLUE SHIELD | Admitting: Family Medicine

## 2018-11-05 VITALS — BP 106/64 | HR 79 | Ht 62.0 in

## 2018-11-05 DIAGNOSIS — Z7901 Long term (current) use of anticoagulants: Secondary | ICD-10-CM

## 2018-11-05 DIAGNOSIS — K921 Melena: Secondary | ICD-10-CM

## 2018-11-05 DIAGNOSIS — E0500 Thyrotoxicosis with diffuse goiter without thyrotoxic crisis or storm: Secondary | ICD-10-CM

## 2018-11-05 DIAGNOSIS — R634 Abnormal weight loss: Secondary | ICD-10-CM | POA: Diagnosis not present

## 2018-11-05 DIAGNOSIS — R5383 Other fatigue: Secondary | ICD-10-CM

## 2018-11-05 DIAGNOSIS — M2550 Pain in unspecified joint: Secondary | ICD-10-CM

## 2018-11-05 NOTE — Telephone Encounter (Signed)
Copied from [facility] [phone]. Topic: General - Inquiry >> Nov 03, 2018  4:06 PM R[place]ford Nail, [place] wrote: Reason for CRM: Patient calling and states that she has an appointment on 11/05/2018 and would like to do the labs asap. Would like to know if Dr Raoul Pitch would allow her to come in before her appointment and get labs drawn? Please advise   Tried to call patient with no answer. Pt has appt this morning at 11am and pt must meet with PCP first before labs can be ordered.

## 2018-11-05 NOTE — Progress Notes (Signed)
VIRTUAL VISIT VIA VIDEO  I connected with Margaret Ortiz on 11/05/18 at 11:00 AM EDT by a video enabled telemedicine application and verified that I am speaking with the correct person using two identifiers. Location patient: Home Location provider: [facility], Office Persons participating in the virtual visit: Patient, Dr. Raoul Pitch and R.Baker, LPN  I discussed the limitations of evaluation and management by telemedicine and the availability of in person appointments. The patient expressed understanding and agreed to proceed.   SUBJECTIVE Chief Complaint  Patient presents with  . Fatigue    Pt states she is tired all the time with some weight loss. States she has been dx Graves disease. Dr Burt Knack was Endocronologist and has not seen since 2012. Sister states pts left eye is starting to close     HPI:  Margaret Ortiz is a 58 y.o. female present with multiple complaints.   Patient reports she has been fatigued constantly over the last 1 to 2 months and has experienced unintentional weight loss.  She also endorses pearly arthralgia affecting her right side only (side affected by stroke).  Patient reports she has a history of Graves' disease and was placed on a medication at one point in time.  She is concerned her fatigue is from her thyroid.  She states that her sister said that she had closure of her left eye, suggesting proptosis.  She feels she is eating and drinking appropriately, however her sister does not feel she is eating enough.  She denies any nausea, vomit, abdominal pain, diarrhea or constipation.  She does endorse having dark, almost black stools at times. For her polyarthralgia affecting the right side of her body she does take Tylenol once daily, and feels that has been helpful but has not resolved the issues.  She is inquiring if there is something else she can take for pain.  ROS: See pertinent positives and negatives per HPI.  Patient Active Problem List   Diagnosis Date Noted  . Difficulty coping with disease 03/31/2018  . Hemiparesis of right dominant side as late effect of cerebral infarction ([place]) 09/17/2017  . Cardioembolic stroke ([place]) 89/38/1017  . 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 Cardiomyopathy ([place]) 09/17/2017  . H/O ischemic left MCA stroke 09/17/2017  . Cardiac LV ejection fraction 21-30%   . Graves disease   . Plaque psoriasis   . Neuropathy 05/19/2017    Social History   Tobacco Use  . Smoking status: Former Research scientist (life sciences)  . Smokeless tobacco: Never Used  Substance Use Topics  . Alcohol use: Never    Frequency: Never    Current Outpatient Medications:  .  acetaminophen (TYLENOL) 325 MG tablet, Take 650 mg by mouth every 6 (six) hours as needed., Disp: , Rfl:  .  apixaban (ELIQUIS) 5 MG TABS tablet, Take 1 tablet (5 mg total) by mouth 2 (two) times daily., Disp: 180 tablet, Rfl: 3 .  atorvastatin (LIPITOR) 80 MG tablet, Take 1 tablet (80 mg total) by mouth daily., Disp: 90 tablet, Rfl: 3 .  carvedilol (COREG) 3.125 MG tablet, Take 1 tablet (3.125 mg total) by mouth 2 (two) times daily., Disp: 180 tablet, Rfl: 3 .  clobetasol cream (TEMOVATE) 5.10 %, Apply 1 application topically 2 (two) times daily., Disp: 30 g, Rfl: 0 .  esomeprazole (NEXIUM) 40 MG capsule, Take 1 capsule (40 mg total) by mouth daily at 12 noon., Disp: 90 capsule, Rfl: 3 .  gabapentin (NEURONTIN) 300 MG capsule, Take  1 capsule (300 mg total) by mouth as directed. 362m in AM and 6063mat bedtime for 1 week, then 30057mn AM, 300m43m afternoon, and 600mg93mbedtime, Disp: 120 capsule, Rfl: 3 .  OVER THE COUNTER MEDICATION, CBD Oil, Disp: , Rfl:   Allergies  Allergen Reactions  . Dust Mite Extract   . Penicillins     OBJECTIVE: BP 106/64   Pulse 79   Ht _0  (1.575 m)   BMI 21.73 kg/m  Gen: No acute distress. Nontoxic in appearance. Well developed female.  HENT: AT. [place].  MMM.  Eyes:Pupils Equal Round Reactive  to light, Extraocular movements intact,  Conjunctiva without redness, discharge or icterus. CV: no edema Chest: Cough or shortness of breath not present.  Neuro:  Alert. Oriented x3  Psych: Normal affect, dress and demeanor. Normal speech. Normal thought content and judgment.  ASSESSMENT AND PLAN: Margaret Ortiz 57 y.66 female present for  Unintentional weight loss/fatigue/ Graves disease - Reported graves disease history. Sounds like she has been on methimazole for a short term and then discontinued . Her TSH, T3f [id number] was26frmal 1 year ago here - weight is same from last year this time- she has fluctuated up and down about 7 lbs.  - encouraged her to eat 3 meals a day, could consider ensure or boost between meals. - Comp Met (CMET); Future - TSH; Future - T4, free; Future - T3, free; Future - Thyroid peroxidase antibody; Future - Thyroid stimulating immunoglobulin; Future - Vitamin D (25 hydroxy); Future - Iron, TIBC and Ferritin Panel; Future - B12; Future  Bloody stool/Chronic anticoagulation - could be cause of fatigue ad weight loss - CBC w/Diff; Future - Iron, TIBC and Ferritin Panel; Future - POC Hemoccult Bld/Stl (3-Cd Home Screen); Future - no prior colon cancer screening --> refer to GI after lab resulted either urgently or for routine screen depending upon results.   Polyarthralgia - Affecting right side only (stroke side) - NSAIDS contraindicated- on chronic anticoagulation - currently taking tylenol daily. Would like to try something else. Advised anything else for pain would be a controlled of some type- could try Tylenol #3 or tramadol. - Vitamin D (25 hydroxy); Future - B12; Future  F/U dependent upon results.   > 25 minutes spent with patient, >50% of time spent face to face counseling      KHoward Pouch14/2020

## 2018-11-06 ENCOUNTER — Other Ambulatory Visit: Payer: Self-pay | Admitting: Family Medicine

## 2018-11-06 ENCOUNTER — Other Ambulatory Visit (INDEPENDENT_AMBULATORY_CARE_PROVIDER_SITE_OTHER): Payer: BLUE CROSS/BLUE SHIELD

## 2018-11-06 DIAGNOSIS — E0500 Thyrotoxicosis with diffuse goiter without thyrotoxic crisis or storm: Secondary | ICD-10-CM | POA: Diagnosis not present

## 2018-11-06 DIAGNOSIS — M2550 Pain in unspecified joint: Secondary | ICD-10-CM | POA: Diagnosis not present

## 2018-11-06 DIAGNOSIS — R5383 Other fatigue: Secondary | ICD-10-CM | POA: Diagnosis not present

## 2018-11-06 DIAGNOSIS — K921 Melena: Secondary | ICD-10-CM | POA: Diagnosis not present

## 2018-11-06 DIAGNOSIS — Z7901 Long term (current) use of anticoagulants: Secondary | ICD-10-CM

## 2018-11-06 DIAGNOSIS — R634 Abnormal weight loss: Secondary | ICD-10-CM | POA: Diagnosis not present

## 2018-11-06 LAB — CBC WITH DIFFERENTIAL/PLATELET
Basophils Absolute: 0.1 10*3/uL (ref 0.0–0.1)
Basophils Relative: 1.4 % (ref 0.0–3.0)
Eosinophils Absolute: 0.1 10*3/uL (ref 0.0–0.7)
Eosinophils Relative: 2.1 % (ref 0.0–5.0)
HCT: 37.6 % (ref 36.0–46.0)
Hemoglobin: 12.7 g/dL (ref 12.0–15.0)
Lymphocytes Relative: 32.5 % (ref 12.0–46.0)
Lymphs Abs: 1.5 10*3/uL (ref 0.7–4.0)
MCHC: 33.7 g/dL (ref 30.0–36.0)
MCV: 91.5 fl (ref 78.0–100.0)
Monocytes Absolute: 0.2 10*3/uL (ref 0.1–1.0)
Monocytes Relative: 5.1 % (ref 3.0–12.0)
Neutro Abs: 2.7 10*3/uL (ref 1.4–7.7)
Neutrophils Relative %: 58.9 % (ref 43.0–77.0)
Platelets: 248 10*3/uL (ref 150.0–400.0)
RBC: 4.11 Mil/uL (ref 3.87–5.11)
RDW: 13.9 % (ref 11.5–15.5)
WBC: 4.5 10*3/uL (ref 4.0–10.5)

## 2018-11-06 LAB — COMPREHENSIVE METABOLIC PANEL
ALT: 90 U/L — ABNORMAL HIGH (ref 0–35)
AST: 51 U/L — ABNORMAL HIGH (ref 0–37)
Albumin: 4 g/dL (ref 3.5–5.2)
Alkaline Phosphatase: 180 U/L — ABNORMAL HIGH (ref 39–117)
BUN: 14 mg/dL (ref 6–23)
CO2: 30 mEq/L (ref 19–32)
Calcium: 9.3 mg/dL (ref 8.4–10.5)
Chloride: 104 mEq/L (ref 96–112)
Creatinine, Ser: 0.84 mg/dL (ref 0.40–1.20)
GFR: 69.71 mL/min (ref >60.00)
Glucose, Bld: 111 mg/dL — ABNORMAL HIGH (ref 70–99)
Potassium: 4.2 mEq/L (ref 3.5–5.1)
Sodium: 142 mEq/L (ref 135–145)
Total Bilirubin: 0.6 mg/dL (ref 0.2–1.2)
Total Protein: 6.5 g/dL (ref 6.0–8.3)

## 2018-11-06 LAB — VITAMIN B12: Vitamin B-12: 1147 pg/mL — ABNORMAL HIGH (ref 211–911)

## 2018-11-06 LAB — VITAMIN D 25 HYDROXY (VIT D DEFICIENCY, FRACTURES): VITD: 27.02 ng/mL — ABNORMAL LOW (ref 30.00–100.00)

## 2018-11-09 ENCOUNTER — Encounter: Payer: Self-pay | Admitting: Family Medicine

## 2018-11-09 ENCOUNTER — Telehealth: Payer: Self-pay | Admitting: Family Medicine

## 2018-11-09 DIAGNOSIS — R634 Abnormal weight loss: Secondary | ICD-10-CM

## 2018-11-09 DIAGNOSIS — R7989 Other specified abnormal findings of blood chemistry: Secondary | ICD-10-CM

## 2018-11-09 NOTE — Telephone Encounter (Signed)
Please inform patient the following information: Her labs are normal with the exception of her liver enzymes are elevated - I have ordered additional labs to be completed, as well as a liver US. Image center will call to schedule this for her. Please set her up for lab appt.   - Please ask her to return to Lipitor 40 mg daily for now- she can take half a tab of what she currently has.  - Also, she stated she was taking tylenol for her pain- please verify how much tylenol a day  She was  Taking and ask her to stop this for now also. Not certain either of these are the cause of her elevated LFT- both have the potential side effect.  - We will need to perform a follow virtual visit after 2 days after her [place] is completed to discuss further. When they get scheduled for the [place], call here and schedule followup for 2 days after.

## 2018-11-09 NOTE — Telephone Encounter (Signed)
Called pt and message was left to check my chart and to return call with any questions.

## 2018-11-11 ENCOUNTER — Other Ambulatory Visit (INDEPENDENT_AMBULATORY_CARE_PROVIDER_SITE_OTHER): Payer: BLUE CROSS/BLUE SHIELD

## 2018-11-11 DIAGNOSIS — Z7901 Long term (current) use of anticoagulants: Secondary | ICD-10-CM | POA: Diagnosis not present

## 2018-11-11 DIAGNOSIS — K921 Melena: Secondary | ICD-10-CM

## 2018-11-11 LAB — HEMOCCULT SLIDES (X 3 CARDS)
Fecal Occult Blood: NEGATIVE
OCCULT 1: NEGATIVE
OCCULT 2: NEGATIVE
OCCULT 3: NEGATIVE
OCCULT 4: NEGATIVE
OCCULT 5: NEGATIVE

## 2018-11-11 LAB — TSH: TSH: 1.39 mIU/L (ref 0.40–4.50)

## 2018-11-11 LAB — IRON,TIBC AND FERRITIN PANEL
%SAT: 41 % (calc) (ref 16–45)
Ferritin: 102 ng/mL (ref 16–232)
Iron: 111 ug/dL (ref 45–160)
TIBC: 274 mcg/dL (calc) (ref 250–450)

## 2018-11-11 LAB — T4, FREE: Free T4: 1.2 ng/dL (ref 0.8–1.8)

## 2018-11-11 LAB — T3, FREE: T3, Free: 2.9 pg/mL (ref 2.3–4.2)

## 2018-11-11 LAB — THYROID STIMULATING IMMUNOGLOBULIN: TSI: <89 % baseline (ref <140)

## 2018-11-11 LAB — THYROID PEROXIDASE ANTIBODY: Thyroperoxidase Ab SerPl-aCnc: 113 IU/mL — ABNORMAL HIGH (ref <9)

## 2018-11-13 ENCOUNTER — Other Ambulatory Visit: Payer: Self-pay

## 2018-11-13 MED ORDER — CLOBETASOL PROPIONATE 0.05 % EX CREA: 1.0000 "application " | TOPICAL_CREAM | Freq: Two times a day (BID) | CUTANEOUS | 5 refills | Status: DC

## 2018-11-13 NOTE — Telephone Encounter (Signed)
Received paper refill request for Clobetasol Cream from [facility]. Tried to call patient with no answer.   LOV: 11/05/2018 Next ov: 11/23/2018 Last written: 03/09/2018  Please advise

## 2018-11-19 ENCOUNTER — Ambulatory Visit
Admission: RE | Admit: 2018-11-19 | Discharge: 2018-11-19 | Disposition: A | Discharge status: Home / Self Care | Payer: BLUE CROSS/BLUE SHIELD | Source: Ambulatory Visit | Attending: Family Medicine | Admitting: Family Medicine

## 2018-11-19 DIAGNOSIS — R7989 Other specified abnormal findings of blood chemistry: Secondary | ICD-10-CM

## 2018-11-19 DIAGNOSIS — R634 Abnormal weight loss: Secondary | ICD-10-CM

## 2018-11-20 ENCOUNTER — Telehealth: Payer: Self-pay | Admitting: Family Medicine

## 2018-11-20 DIAGNOSIS — R7989 Other specified abnormal findings of blood chemistry: Secondary | ICD-10-CM | POA: Insufficient documentation

## 2018-11-20 DIAGNOSIS — K921 Melena: Secondary | ICD-10-CM

## 2018-11-20 DIAGNOSIS — R634 Abnormal weight loss: Secondary | ICD-10-CM

## 2018-11-20 DIAGNOSIS — I69398 Other sequelae of cerebral infarction: Secondary | ICD-10-CM | POA: Insufficient documentation

## 2018-11-20 MED ORDER — TRAMADOL HCL 50 MG PO TABS: 50.0000 mg | ORAL_TABLET | Freq: Three times a day (TID) | ORAL | 0 refills | Status: DC | PRN

## 2018-11-20 NOTE — Telephone Encounter (Signed)
Pt was called and given results. My chart message was sent with the results per request. Lab appt scheduled for next week. Pt decided she was going to stop taking the Baclofen for no reason. She agreed to start that again and take it as directed. Pt verbalized understanding and will call with any questions or concerns.

## 2018-11-20 NOTE — Telephone Encounter (Signed)
Please inform patient the following information: Her [place] of abdomen is normal. Did she make the lab appt for her additional labs? If not, We need to get her in to get those additional labs to work up her elevated LFT.  I have referred her to gastroenterology to further evaluate her LFT increase and evaluate, unintentional weight loss and her for colon cancer/colonoscopy screening since she has never had one.  For her pain- I see she has an appt to establish with Dr. Aretta Nip, which specializes in patients with pain and s/p stroke patients. This is a great option for her.    - Please ask her if she is taking the baclofen? If she stopped it why did she stop?   - Baclofen and gabapentin are two meds that are used to help with s/p stroke pain. She should continue to take both if there was no reason to stop.  - I have called a short course of tramadol. This is a pain medication and has to be used with caution. She can use every 8-12 hours PRN. Legally can only call in a small amount for first script. If she finds it is helping she can discuss continued use at her upcoming appt with Dr. Letta Pate and they may continue for her. If they decline to take over script- and it is working for her, I would be happy to continue for her, but she would need another appt in person to start a chronic pain med.

## 2018-11-23 ENCOUNTER — Other Ambulatory Visit: Payer: BLUE CROSS/BLUE SHIELD

## 2018-11-25 ENCOUNTER — Other Ambulatory Visit: Payer: Self-pay

## 2018-11-25 ENCOUNTER — Ambulatory Visit (INDEPENDENT_AMBULATORY_CARE_PROVIDER_SITE_OTHER): Payer: BLUE CROSS/BLUE SHIELD | Admitting: Family Medicine

## 2018-11-25 DIAGNOSIS — R634 Abnormal weight loss: Secondary | ICD-10-CM | POA: Diagnosis not present

## 2018-11-25 DIAGNOSIS — R7989 Other specified abnormal findings of blood chemistry: Secondary | ICD-10-CM

## 2018-11-26 LAB — HEPATITIS PANEL, ACUTE
Hep A IgM: NONREACTIVE
Hep B C IgM: NONREACTIVE
Hepatitis B Surface Ag: NONREACTIVE
Hepatitis C Ab: NONREACTIVE
SIGNAL TO CUT-OFF: 0.02 (ref <1.00)

## 2018-11-26 LAB — HIV ANTIBODY (ROUTINE TESTING W REFLEX): HIV 1&2 Ab, 4th Generation: NONREACTIVE

## 2018-11-27 ENCOUNTER — Other Ambulatory Visit: Payer: Self-pay

## 2018-11-27 ENCOUNTER — Ambulatory Visit (INDEPENDENT_AMBULATORY_CARE_PROVIDER_SITE_OTHER): Payer: BLUE CROSS/BLUE SHIELD | Admitting: Family Medicine

## 2018-11-27 ENCOUNTER — Encounter: Payer: Self-pay | Admitting: Physical Medicine & Rehabilitation

## 2018-11-27 ENCOUNTER — Encounter: Payer: Self-pay | Admitting: Family Medicine

## 2018-11-27 ENCOUNTER — Encounter
Payer: BC Managed Care – PPO | Attending: Physical Medicine & Rehabilitation | Admitting: Physical Medicine & Rehabilitation

## 2018-11-27 VITALS — Ht 62.0 in | Wt 118.0 lb

## 2018-11-27 VITALS — BP 113/69 | HR 57 | Ht 62.0 in | Wt 118.0 lb

## 2018-11-27 DIAGNOSIS — R399 Unspecified symptoms and signs involving the genitourinary system: Secondary | ICD-10-CM

## 2018-11-27 DIAGNOSIS — R300 Dysuria: Secondary | ICD-10-CM

## 2018-11-27 DIAGNOSIS — Z1239 Encounter for other screening for malignant neoplasm of breast: Secondary | ICD-10-CM

## 2018-11-27 DIAGNOSIS — G8111 Spastic hemiplegia affecting right dominant side: Secondary | ICD-10-CM | POA: Insufficient documentation

## 2018-11-27 LAB — POCT URINALYSIS DIPSTICK
Bilirubin, UA: NEGATIVE
Blood, UA: NEGATIVE
Glucose, UA: NEGATIVE
Ketones, UA: NEGATIVE
Leukocytes, UA: NEGATIVE
Nitrite, UA: NEGATIVE
Protein, UA: NEGATIVE
Spec Grav, UA: 1.01 (ref 1.010–1.025)
Urobilinogen, UA: 0.2 E.U./dL
pH, UA: 7.5 (ref 5.0–8.0)

## 2018-11-27 MED ORDER — NITROFURANTOIN MONOHYD MACRO 100 MG PO CAPS: 100.0000 mg | ORAL_CAPSULE | Freq: Two times a day (BID) | ORAL | 0 refills | Status: DC

## 2018-11-27 NOTE — Progress Notes (Signed)
VIRTUAL VISIT VIA VIDEO  I connected with Margaret Ortiz on 11/27/18 at  4:00 PM EDT by a video enabled telemedicine application and verified that I am speaking with the correct person using two identifiers. Location patient: Home Location provider: [facility], Office Persons participating in the virtual visit: Patient, Dr. Raoul Pitch and R.Baker, LPN  I discussed the limitations of evaluation and management by telemedicine and the availability of in person appointments. The patient expressed understanding and agreed to proceed.   SUBJECTIVE Chief Complaint  Patient presents with  . painful urination  . Urinary Frequency    patient has had symtoms for a week.     HPI: Margaret Ortiz is a 58 y.o. female present for   Dysuria:  Patient presents for 1 week history of burning with urination and urinary frequency.  She has not had a urinary tract infection in many years but feels this is similar to prior UTI.  She denies fever, chills, nausea, vomit, low back pain or abdominal pain.  She denies vaginal discharge.  She does not have a history of kidney stones.  ROS: See pertinent positives and negatives per HPI.  Patient Active Problem List   Diagnosis Date Noted  . Right spastic hemiplegia ([place]) 11/27/2018  . Elevated LFTs 11/20/2018  . 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 Unintentional weight loss 11/05/2018  . Fatigue 11/05/2018  . Bloody stool 11/05/2018  . Polyarthralgia 11/05/2018  . Chronic anticoagulation 11/05/2018  . Difficulty coping with disease 03/31/2018  . Hemiparesis of right dominant side as late effect of cerebral infarction ([place]) 09/17/2017  . Cardioembolic stroke ([place]) 16/03/9603  . 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 Cardiomyopathy ([place]) 09/17/2017  . H/O ischemic left MCA stroke 09/17/2017  . Cardiac LV ejection fraction 21-30%   . Graves disease   . Plaque  psoriasis   . Neuropathy 05/19/2017    Social History   Tobacco Use  . Smoking status: Former Research scientist (life sciences)  . Smokeless tobacco: Never Used  Substance Use Topics  . Alcohol use: Never    Frequency: Never    Current Outpatient Medications:  .  acetaminophen (TYLENOL) 325 MG tablet, Take 650 mg by mouth every 6 (six) hours as needed., Disp: , Rfl:  .  apixaban (ELIQUIS) 5 MG TABS tablet, Take 1 tablet (5 mg total) by mouth 2 (two) times daily., Disp: 180 tablet, Rfl: 3 .  atorvastatin (LIPITOR) 80 MG tablet, Take 1 tablet (80 mg total) by mouth daily., Disp: 90 tablet, Rfl: 3 .  carvedilol (COREG) 3.125 MG tablet, Take 1 tablet (3.125 mg total) by mouth 2 (two) times daily., Disp: 180 tablet, Rfl: 3 .  clobetasol cream (TEMOVATE) 5.40 %, Apply 1 application topically 2 (two) times daily., Disp: 30 g, Rfl: 5 .  esomeprazole (NEXIUM) 40 MG capsule, Take 1 capsule (40 mg total) by mouth daily at 12 noon., Disp: 90 capsule, Rfl: 3 .  gabapentin (NEURONTIN) 300 MG capsule, Take 1 capsule (300 mg total) by mouth as directed. 300mg  in AM and 600mg  at bedtime for 1 week, then 300mg  in AM, 300mg  in afternoon, and 600mg  at bedtime, Disp: 120 capsule, Rfl: 3 .  OVER THE COUNTER MEDICATION, CBD Oil, Disp: , Rfl:   Allergies  Allergen Reactions  . Dust Mite Extract   . Penicillins     OBJECTIVE: There were no vitals taken for this visit. Gen: No acute distress. Nontoxic in appearance.  HENT: AT. [place].  MMM.  Eyes:Pupils Equal Round Reactive to light, Extraocular movements intact,  Conjunctiva without redness, discharge or icterus. Chest: Cough or shortness of breath bot present.  Skin: no rashes, purpura or petechiae.  Neuro: Alert. Oriented x3   Results for orders placed or performed in visit on 11/27/18 (from the past 24 hour(s))  POCT Urinalysis Dipstick     Status: None   Collection Time: 11/27/18  2:14 PM  Result Value Ref Range   Color, UA yellow    Clarity, UA clear    Glucose, UA  Negative Negative   Bilirubin, UA negative    Ketones, UA negative    Spec Grav, UA 1.010 1.010 - 1.025   Blood, UA negative    pH, UA 7.5 5.0 - 8.0   Protein, UA Negative Negative   Urobilinogen, UA 0.2 0.2 or 1.0 E.U./dL   Nitrite, UA negative    Leukocytes, UA Negative Negative   Appearance     Odor      ASSESSMENT AND PLAN: Margaret Ortiz is a 58 y.o. female present for  Urinary tract infection symptoms/Dysuria -Discussed with her her urine did not appear infectious by point-of-care urine today.  We will send for urine culture.  She does have symptoms of UTI therefore will start Macrobid twice daily since it is the weekend  - POCT Urinalysis Dipstick - Urine Culture -Follow-up PRN  *Also discussed normal HIV and hepatitis panel was resulted today. * mammogram ordered for screening.   > 15 minutes spent with patient, > 50% of that time face to face     Howard Pouch, DO 11/27/2018

## 2018-11-27 NOTE — Patient Instructions (Addendum)
BOTOX CAN HELP EDUCE SPASMS IN YOUR RIGHT FINGER FLEXORS AND RIGHT BICEPS BUT NOT RESTORE YOUR ARM TO NORMAL

## 2018-11-27 NOTE — Progress Notes (Signed)
Urine

## 2018-11-27 NOTE — Progress Notes (Signed)
Subjective:    Patient ID: Margaret Ortiz, female    DOB: February 20, 1961, 58 y.o.   MRN: [id number]  HPI  58 year old female with history of left MCA distribution stroke onset May 19, 2017.  Further work-up showed cardiomyopathy with ejection fraction of 15 to 20%.  She had swallowing problems requiring nasogastric feeding tube.  She underwent comprehensive intensive inpatient rehab from December 4 through July 01, 2017. Patient resumed regular bowel diet after modified barium swallow on June 27, 2017.  She was at another rehab facility until 09/11/2017 and was discharged and moved to [facility].  She established medical care with her primary physician Dr. Howard Pouch.  She sees her neurologist Dr. Metta Clines who is referring her for evaluation of botulinum toxin for spastic hemiplegia.  After relocating to [facility] she underwent extensive outpatient therapy from May 2019 to October 2019 Gait velocity improved from 0.85 feet per second to 1.99 feet per second her ambulation improved from 150 feet with a cane to 300 feet with a cane  Walks 42min twice a day using a cane. Patient dresses herself She has obvious aphasia.  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  Patient indicates that she has discomfort in her right shoulder right arm and hand as well as the right side of her face.   Pain Inventory Average Pain 7 Pain Right Now 7 My pain is intermittent and tightness  In the last 24 hours, has pain interfered with the following? General activity n/a Relation with others n/a Enjoyment of life n/a What TIME of day is your pain at its worst? daytime Sleep (in general) Good  Pain is worse with: sitting and inactivity Pain improves with: nothing Relief from Meds: 4  Mobility walk with assistance use a cane ability to climb steps?  no do you drive?  no needs help with transfers  Function disabled: date disabled . I need assistance  with the following:  shopping  Neuro/Psych bladder control problems weakness tremor dizziness  Prior Studies new  Physicians involved in your care new   Family History  Problem Relation Age of Onset  . Hyperlipidemia Mother   . Hypertension Mother   . Early death Father   . Hypertension Sister   . Heart attack Brother   . Mental illness Brother    Social History   Socioeconomic History  . Marital status: Divorced    Spouse name: Not on file  . Number of children: 1  . Years of education: 75  . Highest education level: Professional school degree (e.g., MD, DDS, DVM, JD)  Occupational History  . 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 Financial resource strain: Not on file  . Food insecurity:    Worry: Not on file    Inability: Not on file  . Transportation needs:    Medical: Not on file    Non-medical: Not on file  Tobacco Use  . Smoking status: Former Research scientist (life sciences)  . Smokeless tobacco: Never Used  Substance and Sexual Activity  . Alcohol use: Never    Frequency: Never  . Drug use: Never  . Sexual activity: Not Currently  Lifestyle  . Physical activity:    Days per week: Not on file    Minutes per session: Not on file  . Stress: Not on file  Relationships  . Social connections:    Talks on phone: Not on file    Gets together: Not on file  Attends religious service: Not on file    Active member of club or organization: Not on file    Attends meetings of clubs or organizations: Not on file    Relationship status: Not on file  Other Topics Concern  . Not on file  Social History Narrative   Divorced.  From [place], moved to [place] to stay with her family after having a stroke March 2019   Graduate degree, works as a Community education officer.   Former smoker.   Exercise routinely prior to stroke.   Wears a hearing aid.   Smoke alarms in the home.   Wears her seatbelt.      Patient is right-handed. She drinks one cup of coffee a day. She now resides  with her sister and brother-in-law.   Past Surgical History:  Procedure Laterality Date  . NO PAST SURGERIES     Past Medical History:  Diagnosis Date  . Aphasia 09/17/2017  . 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  . Cardiac LV ejection fraction 21-30%   . Cardioembolic stroke ([place]) 02/58/5277   Right hemiparesis  . Cardiomyopathy ([place]) 04/2017  . Dysphagia 05/19/2017   Last known diet upgraded to regular diet with thin liquids on 06/27/2017, no records available  . 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 Fall 09/17/2017  . Graves disease   . H/O ischemic left MCA stroke 09/17/2017  . Hemiparesis of right dominant side as late effect of cerebral infarction ([place]) 09/17/2017  . Hyperlipidemia   . Hypertension   . Neuropathy 05/19/2017   s/p stroke  . Plaque psoriasis   . Shoulder subluxation, right, initial encounter 09/17/2017  . Weakness 09/17/2017   BP 113/69   Pulse (!) 57   Ht 5\' 2"  (1.575 m)   Wt 118 lb (53.5 kg)   SpO2 98%   BMI 21.58 kg/m   Opioid Risk Score:   Fall Risk Score:  `1  Depression screen PHQ 2/9  Depression screen PHQ 2/9 09/16/2017  Decreased Interest 0  Down, Depressed, Hopeless 0  PHQ - 2 Score 0    Review of Systems  Constitutional: Positive for unexpected weight change.  HENT: Negative.   Eyes: Negative.   Respiratory: Negative.   Gastrointestinal: Positive for abdominal pain.  Endocrine: Negative.   Genitourinary: Positive for difficulty urinating.  Musculoskeletal: Positive for gait problem.  Skin: Negative.   Allergic/Immunologic: Negative.   Neurological: Positive for dizziness and tremors.  Psychiatric/Behavioral: Negative.   All other systems reviewed and are negative.      Objective:   Physical Exam Constitutional:      Appearance: Normal appearance.  HENT:     Head: Normocephalic and atraumatic.     Mouth/Throat:      Mouth: Mucous membranes are moist.  Eyes:     General: No visual field deficit.    Extraocular Movements: Extraocular movements intact.     Conjunctiva/sclera: Conjunctivae normal.     Pupils: Pupils are equal, round, and reactive to light.  Cardiovascular:     Rate and Rhythm: Normal rate and regular rhythm.     Heart sounds: Normal heart sounds.  Pulmonary:     Effort: Pulmonary effort is normal.     Breath sounds: Normal breath sounds. No stridor. No wheezing.  Abdominal:     General: Abdomen is flat. Bowel sounds are normal. There is no distension.     Palpations:  There is no mass.  Neurological:     Mental Status: She is alert.     Cranial Nerves: Dysarthria present.     Comments: Orientation and cognitive testing limited by aphasia  Unable to perform cerebellar testing on the right side secondary to weakness  Ambulates with a cane and right AFO she is able to walk short distances without the cane.  She has some mild knee instability.  No foot drag with AFO  There is no evidence of clonus in the right foot there is no hamstring spasticity or knee extensor spasticity no plantar flexor or inverter spasticity in the right foot     Motor strength she really has minimal right shoulder abduction primarily using her right trapezius.  She has 2- elbow flexion trace elbow extension she has increased tone in the finger flexors and 0 finger extension. Right lower extremity 4- hip flexion knee extension and to minus ankle dorsiflexion There is right shoulder subluxation but no pain with range of motion with external rotation abduction forward flexion. Tone MAS      Assessment & Plan:   #1.  Left MCA distribution infarct with chronic aphasia, chronic right spastic hemiplegia affecting the upper limb greater than lower limb.  Her deficits are chronic and not expected to improve.  She is 18 months post stroke We discussed that the Botox may help with her increased muscle tone in the  right upper extremity.  We discussed that the Botox will not restore normal function to the upper limb.  Planned Botox dosing 100U Ri biceps 25 R FPL 25U R FDS 50U R FDP  We will schedule in 2 weeks pending preauth

## 2018-11-28 LAB — URINE CULTURE
MICRO NUMBER:: 541738
SPECIMEN QUALITY:: ADEQUATE

## 2018-11-30 ENCOUNTER — Other Ambulatory Visit: Payer: Self-pay

## 2018-11-30 ENCOUNTER — Encounter: Payer: Self-pay | Admitting: Gastroenterology

## 2018-11-30 ENCOUNTER — Telehealth: Payer: Self-pay

## 2018-11-30 ENCOUNTER — Ambulatory Visit (INDEPENDENT_AMBULATORY_CARE_PROVIDER_SITE_OTHER): Payer: BLUE CROSS/BLUE SHIELD | Admitting: Gastroenterology

## 2018-11-30 DIAGNOSIS — Z7901 Long term (current) use of anticoagulants: Secondary | ICD-10-CM

## 2018-11-30 DIAGNOSIS — R945 Abnormal results of liver function studies: Secondary | ICD-10-CM | POA: Diagnosis not present

## 2018-11-30 DIAGNOSIS — I6930 Unspecified sequelae of cerebral infarction: Secondary | ICD-10-CM

## 2018-11-30 DIAGNOSIS — R195 Other fecal abnormalities: Secondary | ICD-10-CM | POA: Diagnosis not present

## 2018-11-30 DIAGNOSIS — R7989 Other specified abnormal findings of blood chemistry: Secondary | ICD-10-CM

## 2018-11-30 NOTE — Telephone Encounter (Signed)
-----   Message from Ma Hillock, DO sent at 11/30/2018  8:05 AM EDT ----- Please inform patient the following information: Her Ucx did not show evidence of infection. She can take the abx prescribed to completion if she is seeing  Improvement. If not improving should be seen by GYN for further evaluation and pelvic exam.

## 2018-11-30 NOTE — Patient Instructions (Addendum)
Your provider has ordered Cologuard testing as an option for colon cancer screening. This is performed by [employer] [facility] and may be out of network with your insurance. PRIOR to completing the test, it is YOUR responsibility to contact your insurance about covered benefits for this test. Your out of pocket expense could be anywhere from $0.00 to $649.00.   When you call to check coverage with your insurer, please provide the following information:   -The ONLY provider of Cologuard is [place] code for Cologuard is [phone].  [employer] NPI # [id number]  -[employer] Tax ID # [id number]   We have already sent your demographic and insurance information to [facility] (phone number [phone]) and they should contact you within the next week regarding your test. If you have not heard from them within the next week, please call our office at [phone].  Continue Nexium 40mg   Once daily.   Thank you for choosing me and [facility].  Pricilla Riffle. Dagoberto Ligas., MD., Marval Regal

## 2018-11-30 NOTE — Progress Notes (Signed)
History of Present Illness: This is a 58 year old female referred by [facility], Renee A, DO for the evaluation of dark stool, elevated LFTs. She multiple comorbities including cardiomyopathy, CVA in 2018 with right hemiparesis and aphasia, chronic anticoagulation. Somewhat difficult to understand likely due to residual aphasia. She relates intermittent dark brown, brown and dark green stool. No red blood per rectum, black stools or tarry stools. Stool Hemoccults were negative in May. No prior colonoscopy. AST/ALT/alk phos noted to be mildly elevated. She denies prior liver disease. Denies abdominal pain, constipation, diarrhea, change in stool caliber, melena, hematochezia, nausea, vomiting, dysphagia, chest pain.  Abd [place] 11/19/2018 IMPRESSION: Pancreas not visualized due to overlying bowel gas. No definite abnormality seen in the abdomen.    Allergies  Allergen Reactions  . Dust Mite Extract   . Penicillins    Outpatient Medications Prior to Visit  Medication Sig Dispense Refill  . acetaminophen (TYLENOL) 325 MG tablet Take 650 mg by mouth every 6 (six) hours as needed.    Marland Kitchen apixaban (ELIQUIS) 5 MG TABS tablet Take 1 tablet (5 mg total) by mouth 2 (two) times daily. 180 tablet 3  . atorvastatin (LIPITOR) 80 MG tablet Take 1 tablet (80 mg total) by mouth daily. 90 tablet 3  . carvedilol (COREG) 3.125 MG tablet Take 1 tablet (3.125 mg total) by mouth 2 (two) times daily. 180 tablet 3  . clobetasol cream (TEMOVATE) 0.32 % Apply 1 application topically 2 (two) times daily. 30 g 5  . esomeprazole (NEXIUM) 40 MG capsule Take 1 capsule (40 mg total) by mouth daily at 12 noon. 90 capsule 3  . gabapentin (NEURONTIN) 300 MG capsule Take 1 capsule (300 mg total) by mouth as directed. 361m in AM and 6059mat bedtime for 1 week, then 30064mn AM, 300m29m afternoon, and 600mg74mbedtime 120 capsule 3  . nitrofurantoin, macrocrystal-monohydrate, (MACROBID) 100 MG capsule Take 1 capsule (100 mg total) by  mouth 2 (two) times daily. 14 capsule 0  . OVER THE COUNTER MEDICATION CBD Oil     No facility-administered medications prior to visit.    Past Medical History:  Diagnosis Date  . Aphasia 09/17/2017  . 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  . Cardiac LV ejection fraction 21-30%   . Cardioembolic stroke (HCC) [place]2612/24/8250ght hemiparesis  . Cardiomyopathy (HCC) [place]2018  . Dysphagia 05/19/2017   Last known diet upgraded to regular diet with thin liquids on 06/27/2017, no records available  . 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 Fall 09/17/2017  . Graves disease   . H/O ischemic left MCA stroke 09/17/2017  . Hemiparesis of right dominant side as late effect of cerebral infarction (HCC) [place]7/2019  . Hyperlipidemia   . Hypertension   . Neuropathy 05/19/2017   s/p stroke  . Plaque psoriasis   . Shoulder subluxation, right, initial encounter 09/17/2017  . Weakness 09/17/2017   Past Surgical History:  Procedure Laterality Date  . NO PAST SURGERIES     Social History   Socioeconomic History  . Marital status: Divorced    Spouse name: Not on file  . Number of children: 1  . Years of education: 16  .62ighest education level: Professional school degree (e.g., MD, DDS, DVM, JD)  Occupational History  . Occupation: ReseaCommunity education officerLA  Social Needs  . Financial resource strain: Not on file  .  Food insecurity:    Worry: Not on file    Inability: Not on file  . Transportation needs:    Medical: Not on file    Non-medical: Not on file  Tobacco Use  . Smoking status: Former Research scientist (life sciences)  . Smokeless tobacco: Never Used  Substance and Sexual Activity  . Alcohol use: Never    Frequency: Never  . Drug use: Never  . Sexual activity: Not Currently  Lifestyle  . Physical activity:    Days per week: Not on file    Minutes per session: Not on file  . Stress: Not on file   Relationships  . Social connections:    Talks on phone: Not on file    Gets together: Not on file    Attends religious service: Not on file    Active member of club or organization: Not on file    Attends meetings of clubs or organizations: Not on file    Relationship status: Not on file  Other Topics Concern  . Not on file  Social History Narrative   Divorced.  From [place], moved to [place] to stay with her family after having a stroke March 2019   Graduate degree, works as a Community education officer.   Former smoker.   Exercise routinely prior to stroke.   Wears a hearing aid.   Smoke alarms in the home.   Wears her seatbelt.      Patient is right-handed. She drinks one cup of coffee a day. She now resides with her sister and brother-in-law.   Family History  Problem Relation Age of Onset  . Hyperlipidemia Mother   . Hypertension Mother   . Early death Father   . Hypertension Sister   . Heart attack Brother   . Mental illness Brother       Review of Systems: Pertinent positive and negative review of systems were noted in the above HPI section. All other review of systems were otherwise negative.   Physical Exam: Telemedicine - not reviewed    Assessment and Recommendations:  1. Change in stool color with dark brown and green stools intermittent with normal color brown stool - colors noted are all in normal range. No melena or hematochezia noted. Stool was Hemoccult negative. No further evaluation needed.    2. CRC screening, average CRC risk. Discussed colonoscopy, Cologuard and her higher risk of cardiovascular complications with colonoscopy/sedation. Proceed with Cologuard as her preference.    3. Elevated LFTs. Acute hepatitis panel negative. Abd [place] negative for hepatobiliary disease. Consider hepatic congestion due to cardiomyopathy. Repeat LFTs in 2 months and if they remain elevated proceed with serologic evaluation.   4. GERD. Antireflux measures and continue  Nexium 40 mg daily.   5. Chronic anticoaguation.   6. Cardiomyopathy. EF=25-30% on echo.  7. S/P CVA in 2018 with right hemiparesis, aphasia.     These services were provided via telemedicine, audio only per patient request.  The patient was at home and the provider was in the office, alone.  We discussed the limitations of evaluation and management by telemedicine and the availability of in person appointments.  Patient consented for this telemedicine visit and is aware of possible charges for this service.  Office CMA or LPN participated in this telemedicine service.  Time spent on call: 12 minutes     cc: Howard Pouch A, DO [id number], [place] [id number]

## 2018-11-30 NOTE — Telephone Encounter (Signed)
Pt was called and given results, verbalized understanding and wanted sent via my chart. This was sent to patient on my chart

## 2018-12-02 ENCOUNTER — Ambulatory Visit: Payer: BC Managed Care – PPO | Attending: Family Medicine | Admitting: Rehabilitative and Restorative Service Providers"

## 2018-12-02 ENCOUNTER — Other Ambulatory Visit: Payer: Self-pay

## 2018-12-02 ENCOUNTER — Ambulatory Visit: Payer: BC Managed Care – PPO | Admitting: Occupational Therapy

## 2018-12-02 ENCOUNTER — Encounter: Payer: Self-pay | Admitting: Rehabilitative and Restorative Service Providers"

## 2018-12-02 DIAGNOSIS — R278 Other lack of coordination: Secondary | ICD-10-CM | POA: Insufficient documentation

## 2018-12-02 DIAGNOSIS — R2681 Unsteadiness on feet: Secondary | ICD-10-CM | POA: Diagnosis present

## 2018-12-02 DIAGNOSIS — G8929 Other chronic pain: Secondary | ICD-10-CM

## 2018-12-02 DIAGNOSIS — R208 Other disturbances of skin sensation: Secondary | ICD-10-CM | POA: Insufficient documentation

## 2018-12-02 DIAGNOSIS — R2689 Other abnormalities of gait and mobility: Secondary | ICD-10-CM

## 2018-12-02 DIAGNOSIS — M6281 Muscle weakness (generalized): Secondary | ICD-10-CM | POA: Diagnosis present

## 2018-12-02 DIAGNOSIS — R41842 Visuospatial deficit: Secondary | ICD-10-CM | POA: Diagnosis present

## 2018-12-02 DIAGNOSIS — R29818 Other symptoms and signs involving the nervous system: Secondary | ICD-10-CM

## 2018-12-02 DIAGNOSIS — I69318 Other symptoms and signs involving cognitive functions following cerebral infarction: Secondary | ICD-10-CM | POA: Diagnosis present

## 2018-12-02 DIAGNOSIS — I69351 Hemiplegia and hemiparesis following cerebral infarction affecting right dominant side: Secondary | ICD-10-CM | POA: Insufficient documentation

## 2018-12-02 DIAGNOSIS — M25511 Pain in right shoulder: Secondary | ICD-10-CM | POA: Diagnosis present

## 2018-12-02 NOTE — Therapy (Signed)
[facility] [address] [place], [place], [address] Phone: [phone]   Fax:  [phone]  Occupational Therapy Treatment  Patient Details  Name: Marticia Reifschneider MRN: [id number] Date of Birth: 1961/06/08 Referring Provider (OT): Dr. Metta Clines   Encounter Date: 12/02/2018  OT End of Session - 12/02/18 1259    Visit Number  9    Number of Visits  12    Authorization Type  BCBS--no visit limt    OT Start Time  1300    OT Stop Time  1335    OT Time Calculation (min)  35 min    Activity Tolerance  Patient tolerated treatment well    Behavior During Therapy  [facility] for tasks assessed/performed       Past Medical History:  Diagnosis Date  . Aphasia 09/17/2017  . 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  . Cardiac LV ejection fraction 21-30%   . Cardioembolic stroke ([place]) 56/86/1683   Right hemiparesis  . Cardiomyopathy ([place]) 04/2017  . Dysphagia 05/19/2017   Last known diet upgraded to regular diet with thin liquids on 06/27/2017, no records available  . 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 Fall 09/17/2017  . Graves disease   . H/O ischemic left MCA stroke 09/17/2017  . Hemiparesis of right dominant side as late effect of cerebral infarction ([place]) 09/17/2017  . Hyperlipidemia   . Hypertension   . Neuropathy 05/19/2017   s/p stroke  . Plaque psoriasis   . Shoulder subluxation, right, initial encounter 09/17/2017  . Weakness 09/17/2017    Past Surgical History:  Procedure Laterality Date  . NO PAST SURGERIES      There were no vitals filed for this visit.  Subjective Assessment - 12/02/18 1543    Pertinent History  Pt with CVA 05/19/2017    Patient Stated Goals  improve hand, improve pain    Currently in Pain?  Yes   unable to accuractely rate due to aphasia                 CLINIC OPERATION  CHANGES: Outpatient Neuro Rehab is open at lower capacity following universal masking, social distancing, and patient screening.  The patient's COVID risk of complications score is 1 Therapist reviewed previously issued HEP with patient and discussed safe positioning of RUE to minimize pain.  Pt was noted to impulsively pull her arm overhead despite previous therapist recommendations.  Therapist reviewed with patient that she shoulder with perform low range shoulder flexion to reach for the floor, or she should perform self ROM shoulder flexion in supine, without overstretching, pt verbalized understanding. Pt donned giv mohr sling and she was able to ambulate with RUE supported and pain free. Pt continues to demonstrate poor overall awareness. She kept discussing her RUE working again. Therapist reinforced that pt would probably not regain motor function of RUE given the amount of time since CVA. Pt was encouraged to continue exercising in order to maintain her ROM. Pt had questions about botox, therapist encouraged pt to discuss with Dr. Letta Pate. Pt was made aware that the botox would be for pain management/ spasticity management.            OT Short Term Goals - 12/02/18 1322      OT SHORT TERM GOAL #1   Title  Pt/family independent with updated HEP for RUE-- check STGs 08/28/18  Status  Achieved      OT SHORT TERM GOAL #2   Title  Pt will report R shoulder/neck pain less than or equal to 5/10 for functional tasks.    Status  Achieved      OT SHORT TERM GOAL #3   Title  Pt will verbalize understanding of proper positioning of RUE to decr pain.    Status  Achieved        OT Long Term Goals - 12/02/18 1323      OT LONG TERM GOAL #1   Title  Pt will perform simple home maintenance tasks mod I incorporating RUE as stabilizer or using hand-over-hand techniques when able.--check LTGs 09/28/18    Status  Achieved      OT LONG TERM GOAL #2   Title  Pt will report R shoulder/neck  pain less than or equal to 3/10 for ADLs.    Status  Not Met   5/10           Plan - 12/02/18 1546    Clinical Impression Statement  Pt returns to OT today after a long break due to concerns about Covid 19. Therapist reviewed HEP and pt agrees with plans for d/c.    Occupational Profile and client history currently impacting functional performance  Pt was independent prior to CVA and living in [place], [place].  Pt currently living with sister/brother-in-law and is not participating in household tasks, unable to work, drive, or live alone safely.    Occupational performance deficits (Please refer to evaluation for details):  ADL's;IADL's;Leisure;Work;Social Participation    Body Structure / Function / Physical Skills  ADL;Balance;Body mechanics;Pain;ROM;UE functional use;Tone;IADL;GMC;FMC;Coordination;Sensation    Rehab Potential  Fair    OT Frequency  2x / week    OT Duration  4 weeks    OT Treatment/Interventions  Self-care/ADL training;Electrical Stimulation;Therapeutic exercise;Patient/family education;Splinting;Neuromuscular education;Paraffin;Moist Heat;Fluidtherapy;Energy conservation;Therapist, nutritional;Therapeutic activities;Cryotherapy;Ultrasound;DME and/or AE instruction;Manual Therapy;Passive range of motion;Cognitive remediation/compensation;Balance training;Aquatic Therapy    Plan  d/c OT    Consulted and Agree with Plan of Care  Patient       Patient will benefit from skilled therapeutic intervention in order to improve the following deficits and impairments:  Body Structure / Function / Physical Skills  Visit Diagnosis: Hemiplegia and hemiparesis following cerebral infarction affecting right dominant side (HCC)  Other disturbances of skin sensation  Visuospatial deficit  Other lack of coordination  Other symptoms and signs involving cognitive functions following cerebral infarction  Other symptoms and signs involving the nervous system  Chronic  right shoulder pain   OCCUPATIONAL THERAPY DISCHARGE SUMMARY   Current functional level related to goals / functional outcomes: Pt returned to OT after a break due to Covid-19 precatuions. Pt's overall status remains unchanged and she has understanding of recommended HEP. Pt however continues to pull on her RUE in positions that may cause pain despite therapist recommendations.   Remaining deficits: R hemiplegia, aphasia, pain, decreased RUE functional use , decreased balance   Education / Equipment: Pt has been educated in proper postioning for RUE as well as HEP. Pt verbalizes understanding. Pt does not require addional OT at this time, and pt demonstrates significant anxiety regarding Covid-19 due to residing with elderly mother.  Plan: Patient agrees to discharge.  Patient goals were not met. Patient is being discharged due to meeting the stated rehab goals.  ?????      Problem List Patient Active Problem List   Diagnosis Date Noted  . Right spastic hemiplegia (  [place]) 11/27/2018  . Elevated LFTs 11/20/2018  . 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 Unintentional weight loss 11/05/2018  . Fatigue 11/05/2018  . Bloody stool 11/05/2018  . Polyarthralgia 11/05/2018  . Chronic anticoagulation 11/05/2018  . Difficulty coping with disease 03/31/2018  . Hemiparesis of right dominant side as late effect of cerebral infarction ([place]) 09/17/2017  . Cardioembolic stroke ([place]) 83/46/2194  . 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 Cardiomyopathy ([place]) 09/17/2017  . H/O ischemic left MCA stroke 09/17/2017  . Cardiac LV ejection fraction 21-30%   . Graves disease   . Plaque psoriasis   . Neuropathy 05/19/2017    Kristyl Athens 12/02/2018, 5:15 PM Theone Murdoch, OTR/L [phone] Phone: [phone] 5:15 PM 12/02/18 [facility] [address]  [place], [place], [address] Phone: [phone]   Fax:  [phone]  Name: Kalla Watson MRN: [id number] Date of Birth: 31-Jan-1961

## 2018-12-02 NOTE — Therapy (Signed)
[facility] [address] [place], [place], [address] Phone: [phone]   Fax:  [phone]  Physical Therapy Treatment and Discharge Summary  Patient Details  Name: Margaret Ortiz MRN: [id number] Date of Birth: 1961-01-30 Referring Provider (PT): Metta Clines, DO   Encounter Date: 12/02/2018  PT End of Session - 12/02/18 1225    Visit Number  6    Number of Visits  8    Date for PT Re-Evaluation  09/28/18    Authorization Type  BCBS    PT Start Time  1204    PT Stop Time  1245    PT Time Calculation (min)  41 min    Activity Tolerance  Patient tolerated treatment well    Behavior During Therapy  [facility] for tasks assessed/performed       Past Medical History:  Diagnosis Date  . Aphasia 09/17/2017  . 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  . Cardiac LV ejection fraction 21-30%   . Cardioembolic stroke ([place]) 62/56/3893   Right hemiparesis  . Cardiomyopathy ([place]) 04/2017  . Dysphagia 05/19/2017   Last known diet upgraded to regular diet with thin liquids on 06/27/2017, no records available  . 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 Fall 09/17/2017  . Graves disease   . H/O ischemic left MCA stroke 09/17/2017  . Hemiparesis of right dominant side as late effect of cerebral infarction ([place]) 09/17/2017  . Hyperlipidemia   . Hypertension   . Neuropathy 05/19/2017   s/p stroke  . Plaque psoriasis   . Shoulder subluxation, right, initial encounter 09/17/2017  . Weakness 09/17/2017    Past Surgical History:  Procedure Laterality Date  . NO PAST SURGERIES      There were no vitals filed for this visit.  Subjective Assessment - 12/02/18 1159    Subjective  The patient reports that her pain in her neck is still present.  She points to her neck, arm, and leg and says "It's worse" to each area of her body.  She feels  her walking is slower.  The patient reports this week is harder and she began crying.  She did not want to talk further.    She has had one fall since last therapy session.      Patient Stated Goals  get rid of brace, use exercise equipment.  She wants to do floor exercises.    Currently in Pain?  Yes   unsure due to aphasia   Pain Onset  More than a month ago    Pain Frequency  Intermittent     CLINIC OPERATION CHANGES: Outpatient Neuro Rehab is open at lower capacity following universal masking, social distancing, and patient screening.  The patient's COVID risk of complications score is 1.     [facility] PT Assessment - 12/02/18 1238      Transfers   Comments  Floor to stand with PT demonstrating and patient performing with L UE support and supervision.                     [facility] Adult PT Treatment/Exercise - 12/02/18 1238      Self-Care   Self-Care  Other Self-Care Comments    Other Self-Care Comments   Patient and PT discussed discharge from physical therapy.  She inquired about coming into therapy 1x/month.  She is concerned about the virus and wears  shoe covers, gloves, and carries a wipe with her to wipe off every surface she is near in the gym.  We discussed that at this time it would be best for her to continue doing her HEP and continue walking.  She reported that she and a doctor (anticipate Dr. Letta Pate) discussed "little progress" and she asks why.        Exercises   Exercises  Other Exercises    Other Exercises   Reviewed part of HEP including wall slides, seated knee flexion, standing heel cord stretch.                PT Short Term Goals - 12/02/18 1546      PT SHORT TERM GOAL #1   Title  The patient will be able to perform HEP mod indep for LE strength (ankle isolated control, hamstring, hip extensors).    Time  4    Period  Weeks    Status  Achieved    Target Date  08/29/18      PT SHORT TERM GOAL #2   Title  The patient will improve Berg score  from 44/56 to > or equal to 48/56 to demo improving balance.    Baseline  remains at 44/56.    Time  4    Period  Weeks    Status  Not Met    Target Date  08/29/18      PT SHORT TERM GOAL #3   Title  The patient will ambulate 30 minutes with PT to observe for postural asymmetry that may be increasing shoulder/neck pain on R side *possible that shorter duration may help improve mechanics-- she may be getting worsening mechanics with fatigue.    Baseline  did not retest/ reviewed HEP and d/c today.    Time  4    Period  Weeks    Status  Deferred    Target Date  08/29/18        PT Long Term Goals - 12/02/18 1508      PT LONG TERM GOAL #1   Title  The patient will negotiate a curb mod indep.    Baseline  Did not retest.    Time  8    Period  Weeks    Status  Deferred      PT LONG TERM GOAL #2   Title  The patient will move floor<>stand with mod indep using L UE support on surface.    Baseline  floor<>stand supervision.    Status  Partially Met      PT LONG TERM GOAL #3   Title  The patient will improve gait speed without device from 1.42 ft/sec to > or equal to 1.8 ft/sec to demo improving balance/stability without a device.    Baseline  Not tested today    Time  8    Period  Weeks    Status  Deferred            Plan - 12/02/18 1548    Clinical Impression Statement  The patient returns to clinic today for one f/u after being on hold for nearly 3 months.  She is emotional today and from our conversation, I feel she may have had a discussion about deficits being chronic in nature at a recent MD appt.  She was unable to articulate full conversation due to aphasia.  We discussed continuing HEP and walking and patient agrees with current plan.    PT Treatment/Interventions  ADLs/Self Care Home  Management;Therapeutic activities;Therapeutic exercise;Neuromuscular re-education;Balance training;Gait training;Stair training;Functional mobility training;Patient/family  education;Manual techniques    PT Next Visit Plan  Discharge today.    Consulted and Agree with Plan of Care  Patient       Patient will benefit from skilled therapeutic intervention in order to improve the following deficits and impairments:  Abnormal gait, Decreased balance, Decreased strength, Postural dysfunction, Impaired flexibility, Pain  Visit Diagnosis: Other symptoms and signs involving the nervous system  Unsteadiness on feet  Muscle weakness (generalized)  Other abnormalities of gait and mobility    PHYSICAL THERAPY DISCHARGE SUMMARY  Visits from Start of Care: 6  Current functional level related to goals / functional outcomes: See above   Remaining deficits: Chronic R hemimotor impairments.   Education / Equipment: Home program, home walking, positioning extremity to reduce pain.  Plan: Patient agrees to discharge.  Patient goals were partially met. Patient is being discharged due to meeting the stated rehab goals.  ?????         Thank you for the referral of this patient. Rudell Cobb, MPT  [place], PT 12/02/2018, 3:51 PM  [facility] [address] [place], [place], [address] Phone: [phone]   Fax:  [phone]  Name: Margaret Ortiz MRN: [id number] Date of Birth: 1961/02/10

## 2018-12-03 ENCOUNTER — Telehealth: Payer: Self-pay | Admitting: Interventional Cardiology

## 2018-12-03 NOTE — Telephone Encounter (Signed)
Patient sent a MyChart message stating that she felt weak and requesting help with meds and echo. I advised the patient to call so that we could get more specific information. Patient reports "feeling weak." States BP 110/70 mmHg and HR 70 bpm. It is difficult to understand every word that she says. I asked her how her rehab therapist thought she was doing when she went to therapy yesterday and she states "fine."  Patient reports potential treatment needed for shoulder pain. I asked if she has seen any blood in her stool and she denies. Reports adherence with medications and that she is walking daily. I asked about dizziness and she states she expects some due to stroke 18 months ago. Patient states she just feels weak and wonders if any of her medications need to change or if she needs an echo. She states that she eats and drinks regularly through the day. Patient has multiple potential causes of weakness. I advised that I will send message to Dr. Irish Lack for any advice that he may have. She verbalized understanding and agreement and thanked me for the call.

## 2018-12-03 NOTE — Telephone Encounter (Signed)
COntinue plan or echo in August 2020.  No changes to medicine at this time.  SHe has been on the same cardiac meds for a while. I doubt the weakness if from a cardiac source.  Vital signs were good.

## 2018-12-03 NOTE — Telephone Encounter (Signed)
New message   Patient states that she is weak. There is a language barrier please call the patient.

## 2018-12-04 NOTE — Telephone Encounter (Signed)
Called and made patient aware that Dr. Irish Lack would like to continue with the plan for repeating her echo in October 2020, but can order in August 2020 (1 year after her last one). Patient will wait until October 2020.   Made patient aware that there are no changes to medicine needed at this time. Made her aware that her vitals were good and that he doubted the weakness is from a cardiac source. Patient verbalized understanding and thanked me for the call.

## 2018-12-07 ENCOUNTER — Other Ambulatory Visit: Payer: Self-pay | Admitting: Neurology

## 2018-12-11 ENCOUNTER — Encounter: Payer: Self-pay | Admitting: Physical Medicine & Rehabilitation

## 2018-12-11 ENCOUNTER — Other Ambulatory Visit: Payer: Self-pay

## 2018-12-11 ENCOUNTER — Encounter (HOSPITAL_BASED_OUTPATIENT_CLINIC_OR_DEPARTMENT_OTHER): Payer: BC Managed Care – PPO | Admitting: Physical Medicine & Rehabilitation

## 2018-12-11 VITALS — BP 112/75 | HR 61 | Temp 97.9°F | Ht 62.0 in | Wt 116.0 lb

## 2018-12-11 DIAGNOSIS — G8111 Spastic hemiplegia affecting right dominant side: Secondary | ICD-10-CM | POA: Diagnosis not present

## 2018-12-11 NOTE — Patient Instructions (Signed)

## 2018-12-11 NOTE — Progress Notes (Signed)
Botox Injection for spasticity using needle EMG guidance  Dilution: 50 Units/ml Indication: Severe spasticity which interferes with ADL,mobility and/or  hygiene and is unresponsive to medication management and other conservative care Informed consent was obtained after describing risks and benefits of the procedure with the patient. This includes bleeding, bruising, infection, excessive weakness, or medication side effects. A REMS form is on file and signed. Needle: 27g 1" needle electrode Number of units per muscle RIGHT FPL 50 FDS 50 FCR 25 PT 25 FDP 50  All injections were done after obtaining appropriate EMG activity and after negative drawback for blood. The patient tolerated the procedure well. Post procedure instructions were given. A followup appointment was made.

## 2018-12-14 ENCOUNTER — Other Ambulatory Visit: Payer: Self-pay

## 2018-12-14 LAB — COLOGUARD: Cologuard: NEGATIVE

## 2018-12-18 ENCOUNTER — Encounter: Payer: Self-pay | Admitting: Neurology

## 2018-12-24 NOTE — Progress Notes (Signed)
Virtual Visit via Video Note The purpose of this virtual visit is to provide medical care while limiting exposure to the novel coronavirus.    Consent was obtained for video visit:  Yes Answered questions that patient had about telehealth interaction:  Yes I discussed the limitations, risks, security and privacy concerns of performing an evaluation and management service by telemedicine. I also discussed with the patient that there may be a patient responsible charge related to this service. The patient expressed understanding and agreed to proceed.  Pt location: Home Physician Location: Home Name of referring provider:  Ma Hillock, DO I connected with Verl Tetrault at patients initiation/request on 12/28/2018 at 10:30 AM EDT by video enabled telemedicine application and verified that I am speaking with the correct person using two identifiers. Pt MRN:  [id number] Pt DOB:  1960-09-20 Video Participants:  [name]   History of Present Illness:  Margaret Ortiz is a 58 year old right-handed female with Grave's disease, plaque psoriasis, cardiomyopathy and residual right sided weakness, aphasia and dysarthria secondary to left MCA stroke who follows up for stroke.  UPDATE: Current medications:  Eliquis, atorvastatin 80mg , gabapentin 300mg /300mg /600mg  LDL from June was 97.  LDL from 06/22/18 was 108.  Atorvastatin was increased to 80mg  daily. She has right sided pain and spasticity secondary to stroke, for which she takes gabapentin.  Baclofen ineffective.  She was referred to Dr. Letta Pate for Botox.  She was also ordered PT and speech therapy but is on-hold due to the COVID-19 pandemic. They recommended home exercise program.   HISTORY: She was admitted to [facility] 05/19/17 for left MCA stroke. She was found to have a thrombus on imaging but was determined not to be a candidate for intervention due to completed stroke. She was diagnosed with cardiomyopathy  as echocardiogram demonstrated a LV EF of 15-20%. Other testing is not available to me at this time. Repeat echocardiogram on 11/30 showed improved EF of 20-25%. It was believed that her stroke was likely cardioembolic secondary to undiagnosed cardiomyopathy. She was started on Coumadin and subsequently . She had developed significant dysphagia. MBS on 06/27/17 indicated that her diet could be upgraded to regular diet with thin liquids. She was admitted to inpatient rehab until March.  She has since moved to [place] to [place] her sister until she recovers well enough to move back to [place] alone. She continues to have residual right sided upper and lower extremity weakness.She is currently undergoing outpatient rehab(speech/PT/OT). Speech is improved. She is swallowing well and is on a regular diet. Strength in her right leg is improved and is now able to ambulate with cane. She is still plegic in the right arm. She is independent and manages her finances and performs ADLs. Most recent echocardiogram from 08/20/17 showed EF of 15-20%.   Past Medical History: Past Medical History:  Diagnosis Date  . Aphasia 09/17/2017  . 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  . Cardiac LV ejection fraction 21-30%   . Cardioembolic stroke ([place]) 50/02/3817   Right hemiparesis  . Cardiomyopathy ([place]) 04/2017  . Dysphagia 05/19/2017   Last known diet upgraded to regular diet with thin liquids on 06/27/2017, no records available  . 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 Fall 09/17/2017  . Graves disease   . H/O ischemic left MCA stroke 09/17/2017  . Hemiparesis of right dominant side as late  effect of cerebral infarction ([place]) 09/17/2017  . Hyperlipidemia   . Hypertension   . Neuropathy 05/19/2017   s/p stroke  . Plaque psoriasis   . Shoulder subluxation, right, initial encounter  09/17/2017  . Weakness 09/17/2017    Medications: Outpatient Encounter Medications as of 12/28/2018  Medication Sig  . acetaminophen (TYLENOL) 325 MG tablet Take 650 mg by mouth every 6 (six) hours as needed.  Marland Kitchen apixaban (ELIQUIS) 5 MG TABS tablet Take 1 tablet (5 mg total) by mouth 2 (two) times daily.  Marland Kitchen atorvastatin (LIPITOR) 80 MG tablet Take 1 tablet (80 mg total) by mouth daily.  . carvedilol (COREG) 3.125 MG tablet Take 1 tablet (3.125 mg total) by mouth 2 (two) times daily.  . clobetasol cream (TEMOVATE) 3.73 % Apply 1 application topically 2 (two) times daily.  Marland Kitchen esomeprazole (NEXIUM) 40 MG capsule Take 1 capsule (40 mg total) by mouth daily at 12 noon.  . gabapentin (NEURONTIN) 300 MG capsule Take 1 cap in the am, and 2 cap in the pm  . nitrofurantoin, macrocrystal-monohydrate, (MACROBID) 100 MG capsule Take 1 capsule (100 mg total) by mouth 2 (two) times daily.  Marland Kitchen OVER THE COUNTER MEDICATION CBD Oil   No facility-administered encounter medications on file as of 12/28/2018.     Allergies: Allergies  Allergen Reactions  . Dust Mite Extract   . Penicillins     Family History: Family History  Problem Relation Age of Onset  . Hyperlipidemia Mother   . Hypertension Mother   . Early death Father   . Hypertension Sister   . Heart attack Brother   . Mental illness Brother     Social History: Social History   Socioeconomic History  . Marital status: Divorced    Spouse name: Not on file  . Number of children: 1  . Years of education: 107  . Highest education level: Professional school degree (e.g., MD, DDS, DVM, JD)  Occupational History  . 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 Financial resource strain: Not on file  . Food insecurity    Worry: Not on file    Inability: Not on file  . Transportation needs    Medical: Not on file    Non-medical: Not on file  Tobacco Use  . Smoking status: Former Research scientist (life sciences)  . Smokeless tobacco: Never Used  Substance and Sexual  Activity  . Alcohol use: Never    Frequency: Never  . Drug use: Never  . Sexual activity: Not Currently  Lifestyle  . Physical activity    Days per week: Not on file    Minutes per session: Not on file  . Stress: Not on file  Relationships  . Social Herbalist on phone: Not on file    Gets together: Not on file    Attends religious service: Not on file    Active member of club or organization: Not on file    Attends meetings of clubs or organizations: Not on file    Relationship status: Not on file  . Intimate partner violence    Fear of current or ex partner: Not on file    Emotionally abused: Not on file    Physically abused: Not on file    Forced sexual activity: Not on file  Other Topics Concern  . Not on file  Social History Narrative   Divorced.  From [place], moved to [place] to stay with her family after having a stroke March 2019  Graduate degree, works as a Community education officer.   Former smoker.   Exercise routinely prior to stroke.   Wears a hearing aid.   Smoke alarms in the home.   Wears her seatbelt.      Patient is right-handed. She drinks one cup of coffee a day. She now resides with her sister and brother-in-law.    Observations/Objective:   Height 5\' 2"  (1.575 m), weight 116 lb (52.6 kg). No acute distress.  Alert and oriented.  Speech slightly slowed but fluent and not dysarthric.  Language intact.  Eyes orthophoric on primary gaze.  Face symmetric.  Assessment and Plan:   Right spastic hemiparesis and expressive aphasia secondary to left MCA territory infarct, likely cardioembolic secondary to cardiomyopathy.  1.  Eliquis for secondary stroke prevention 2.  Atorvastatin 80mg  daily.  LDL goal less than 70 3.  Gabapentin for neuropathic pain 4.  Botox for spastic hemiplegia 5.  Home exercises 6.  Follow up in one year or as needed.  Follow Up Instructions:    -I discussed the assessment and treatment plan with the patient. The patient  was provided an opportunity to ask questions and all were answered. The patient agreed with the plan and demonstrated an understanding of the instructions.   The patient was advised to call back or seek an in-person evaluation if the symptoms worsen or if the condition fails to improve as anticipated.    Total Time spent in visit with the patient was:  15 minutes  Dudley Major, DO

## 2018-12-28 ENCOUNTER — Other Ambulatory Visit: Payer: Self-pay

## 2018-12-28 ENCOUNTER — Telehealth (INDEPENDENT_AMBULATORY_CARE_PROVIDER_SITE_OTHER): Payer: BC Managed Care – PPO | Admitting: Neurology

## 2018-12-28 ENCOUNTER — Encounter: Payer: Self-pay | Admitting: Neurology

## 2018-12-28 VITALS — Ht 62.0 in | Wt 116.0 lb

## 2018-12-28 DIAGNOSIS — I429 Cardiomyopathy, unspecified: Secondary | ICD-10-CM

## 2018-12-28 DIAGNOSIS — I69351 Hemiplegia and hemiparesis following cerebral infarction affecting right dominant side: Secondary | ICD-10-CM | POA: Diagnosis not present

## 2018-12-28 DIAGNOSIS — I69320 Aphasia following cerebral infarction: Secondary | ICD-10-CM

## 2019-01-07 ENCOUNTER — Ambulatory Visit: Payer: BC Managed Care – PPO

## 2019-01-08 ENCOUNTER — Encounter
Payer: BC Managed Care – PPO | Attending: Physical Medicine & Rehabilitation | Admitting: Physical Medicine & Rehabilitation

## 2019-01-08 ENCOUNTER — Other Ambulatory Visit: Payer: Self-pay

## 2019-01-08 ENCOUNTER — Encounter: Payer: Self-pay | Admitting: Physical Medicine & Rehabilitation

## 2019-01-08 VITALS — BP 121/78 | HR 60 | Temp 97.7°F | Ht 61.0 in | Wt 117.0 lb

## 2019-01-08 DIAGNOSIS — G8111 Spastic hemiplegia affecting right dominant side: Secondary | ICD-10-CM | POA: Insufficient documentation

## 2019-01-08 DIAGNOSIS — M7918 Myalgia, other site: Secondary | ICD-10-CM

## 2019-01-08 NOTE — Patient Instructions (Signed)
Trigger Point Injection Trigger points are areas where you have pain. A trigger point injection is a shot given in the trigger point to help relieve pain for a few days to a few months. Common places for trigger points include:  The neck.  The shoulders.  The upper back.  The lower back. A trigger point injection will not cure long-term (chronic) pain permanently. These injections do not always work for every person. For some people, they can help to relieve pain for a few days to a few months. Tell a health care provider about:  Any allergies you have.  All medicines you are taking, including vitamins, herbs, eye drops, creams, and over-the-counter medicines.  Any problems you or family members have had with anesthetic medicines.  Any blood disorders you have.  Any surgeries you have had.  Any medical conditions you have. What are the risks? Generally, this is a safe procedure. However, problems may occur, including:  Infection.  Bleeding or bruising.  Allergic reaction to the injected medicine.  Irritation of the skin around the injection site. What happens before the procedure? Ask your health care provider about:  Changing or stopping your regular medicines. This is especially important if you are taking diabetes medicines or blood thinners.  Taking medicines such as aspirin and ibuprofen. These medicines can thin your blood. Do not take these medicines unless your health care provider tells you to take them.  Taking over-the-counter medicines, vitamins, herbs, and supplements. What happens during the procedure?   Your health care provider will feel for trigger points. A marker may be used to circle the area for the injection.  The skin over the trigger point will be washed with a germ-killing (antiseptic) solution.  A thin needle is used for the injection. You may feel pain or a twitching feeling when the needle enters the trigger point.  A numbing solution may  be injected into the trigger point. Sometimes a medicine to keep down inflammation is also injected.  Your health care provider may move the needle around the area where the trigger point is located until the tightness and twitching goes away.  After the injection, your health care provider may put gentle pressure over the injection site.  The injection site will be covered with a bandage (dressing). The procedure may vary among health care providers and hospitals. What can I expect after treatment? After treatment, you may have:  Soreness and stiffness for 1-2 days.  A dressing. This can be taken off in a few hours or as told by your health care provider. Follow these instructions at home: Injection site care  Remove your dressing as told by your health care provider.  Check your injection site every day for signs of infection. Check for: ? Redness, swelling, or pain. ? Fluid or blood. ? Warmth. ? Pus or a bad smell. Managing pain, stiffness, and swelling  If directed, put ice on the affected area. ? Put ice in a plastic bag. ? Place a towel between your skin and the bag. ? Leave the ice on for 20 minutes, 2-3 times a day. General instructions  If you were asked to stop your regular medicines, ask your health care provider when you may start taking them again.  Return to your normal activities as told by your health care provider. Ask your health care provider what activities are safe for you.  Do not take baths, swim, or use a hot tub until your health care provider approves.    You may be asked to see an occupational or physical therapist for exercises that reduce muscle strain and stretch the area of the trigger point.  Keep all follow-up visits as told by your health care provider. This is important. Contact a health care provider if:  Your pain comes back, and it is worse than before the injection. You may need more injections.  You have chills or a fever.  The  injection site becomes more painful, red, swollen, or warm to the touch. Summary  A trigger point injection is a shot given in the trigger point to help relieve pain for a few days to a few months.  Common places for trigger point injections are the neck, shoulder, upper back, and lower back.  These injections do not always work for every person, but for some people, the injections can help to relieve pain for a few days to a few months.  Contact a health care provider if symptoms come back or they are worse than before treatment. Also, get help if the injection site becomes more painful, red, swollen, or warm to the touch. This information is not intended to replace advice given to you by your health care provider. Make sure you discuss any questions you have with your health care provider. Document Released: 05/30/2011 Document Revised: 07/22/2018 Document Reviewed: 07/22/2018 Elsevier Patient Education  2020 Elsevier Inc.  

## 2019-01-08 NOTE — Progress Notes (Signed)
Trigger Point Injection  Indication: Right trapezius  Myofascial pain not relieved by medication management and other conservative care.  Informed consent was obtained after describing risk and benefits of the procedure with the patient, this includes bleeding, bruising, infection and medication side effects.  The patient wishes to proceed and has given written consent.  The patient was placed in a seated  position.  The  Right infraspinatus, Right Levator scap and R upper trap  area was marked and prepped with Betadine.  It was entered with a 25-gauge 1-1/2 inch needle and 1 mL of 1% lidocaine was injected into each of 3 trigger points, after negative draw back for blood.  The patient tolerated the procedure well.  Post procedure instructions were given.  Improved tone in Right hand, patient is moving her fingers slightly in digits 3 and 4 into flexion but no wrist flexion. Continues have right shoulder subluxation without active deltoid

## 2019-01-10 ENCOUNTER — Encounter: Payer: Self-pay | Admitting: Family Medicine

## 2019-01-21 ENCOUNTER — Encounter: Payer: Self-pay | Admitting: Family Medicine

## 2019-01-21 ENCOUNTER — Other Ambulatory Visit: Payer: Self-pay

## 2019-01-21 ENCOUNTER — Ambulatory Visit (INDEPENDENT_AMBULATORY_CARE_PROVIDER_SITE_OTHER): Payer: BC Managed Care – PPO | Admitting: Family Medicine

## 2019-01-21 ENCOUNTER — Telehealth: Payer: Self-pay

## 2019-01-21 VITALS — BP 105/69 | HR 61 | Temp 98.3°F | Resp 18 | Ht 61.0 in | Wt 119.1 lb

## 2019-01-21 DIAGNOSIS — M25511 Pain in right shoulder: Secondary | ICD-10-CM | POA: Diagnosis not present

## 2019-01-21 DIAGNOSIS — M2550 Pain in unspecified joint: Secondary | ICD-10-CM | POA: Diagnosis not present

## 2019-01-21 DIAGNOSIS — G8929 Other chronic pain: Secondary | ICD-10-CM

## 2019-01-21 DIAGNOSIS — G8111 Spastic hemiplegia affecting right dominant side: Secondary | ICD-10-CM

## 2019-01-21 DIAGNOSIS — I83811 Varicose veins of right lower extremities with pain: Secondary | ICD-10-CM

## 2019-01-21 DIAGNOSIS — M79604 Pain in right leg: Secondary | ICD-10-CM

## 2019-01-21 MED ORDER — TRAMADOL HCL 50 MG PO TABS: 50.0000 mg | ORAL_TABLET | Freq: Two times a day (BID) | ORAL | 5 refills | Status: DC | PRN

## 2019-01-21 NOTE — Progress Notes (Signed)
Margaret Ortiz, Margaret Ortiz 06/03/1961, 58 y.o., female MRN: [id number] Patient Care Team    Relationship Specialty Notifications Start End  Margaret Hillock, DO PCP - General Family Medicine  09/16/17   Margaret Booze, MD Consulting Physician Cardiology  05/18/18   Margaret Ortiz, Margaret Ortiz Physician Neurology  05/18/18     Chief Complaint  Patient presents with  . Shoulder Pain    Pt having right shoulder pain and right knee pain x6 months   . Knee Pain     Subjective: Pt presents for an OV with complaints of right shoulder pain and right knee pain of 6 months duration.  The areas of her pain are secondary to her stroke.  She has worked with OT and PT and she felt she was receiving some improvement and benefit in her symptoms.  She would like a new referral placed to the physical therapy in [facility] if possible.  She has been receiving Botox injections to help with spasms secondary to stroke through Dr. Delynn Ortiz.  She was tried on a short course of tramadol in the past and it was at least mildly helpful.  She is currently taking Tylenol however she has had elevated liver enzymes.  She was told to stop taking Tylenol, however she has continued.  She would like a medication to help her with her pain.  Depression screen [facility] 2/9 09/16/2017  Decreased Interest 0  Down, Depressed, Hopeless 0  PHQ - 2 Score 0    Allergies  Allergen Reactions  . Dust Mite Extract   . Penicillins    Social History   Social History Narrative   Divorced.  From [place], moved to [place] to stay with her family after having a stroke March 2019   Graduate degree, works as a Community education officer.   Former smoker.   Exercise routinely prior to stroke.   Wears a hearing aid.   Smoke alarms in the home.   Wears her seatbelt.      Patient is right-handed. She drinks one cup of coffee a day. She now resides with her sister and brother-in-law.   Past Medical History:  Diagnosis Date  . Aphasia 09/17/2017  .  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  . Cardiac LV ejection fraction 21-30%   . Cardioembolic stroke ([place]) 76/72/0947   Right hemiparesis  . Cardiomyopathy ([place]) 04/2017  . Dysphagia 05/19/2017   Last known diet upgraded to regular diet with thin liquids on 06/27/2017, no records available  . 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 Fall 09/17/2017  . Graves disease   . H/O ischemic left MCA stroke 09/17/2017  . Hemiparesis of right dominant side as late effect of cerebral infarction ([place]) 09/17/2017  . Hyperlipidemia   . Hypertension   . Neuropathy 05/19/2017   s/p stroke  . Plaque psoriasis   . Shoulder subluxation, right, initial encounter 09/17/2017  . Weakness 09/17/2017   Past Surgical History:  Procedure Laterality Date  . NO PAST SURGERIES     Family History  Problem Relation Age of Onset  . Hyperlipidemia Mother   . Hypertension Mother   . Early death Father   . Hypertension Sister   . Heart attack Brother   . Mental illness Brother    Allergies as of 01/21/2019      Reactions   Dust Mite Extract    Penicillins  Medication List       Accurate as of January 21, 2019 10:54 AM. If you have any questions, ask your nurse or doctor.        acetaminophen 325 MG tablet Commonly known as: TYLENOL Take 650 mg by mouth every 6 (six) hours as needed.   apixaban 5 MG Tabs tablet Commonly known as: Eliquis Take 1 tablet (5 mg total) by mouth 2 (two) times daily.   atorvastatin 80 MG tablet Commonly known as: LIPITOR Take 1 tablet (80 mg total) by mouth daily.   carvedilol 3.125 MG tablet Commonly known as: COREG Take 1 tablet (3.125 mg total) by mouth 2 (two) times daily.   clobetasol cream 0.05 % Commonly known as: TEMOVATE Apply 1 application topically 2 (two) times daily.   esomeprazole 40 MG capsule Commonly known as: NEXIUM Take 1 capsule (40 mg  total) by mouth daily at 12 noon.   gabapentin 300 MG capsule Commonly known as: NEURONTIN Take 1 cap in the am, and 2 cap in the pm What changed: additional instructions   nitrofurantoin (macrocrystal-monohydrate) 100 MG capsule Commonly known as: Macrobid Take 1 capsule (100 mg total) by mouth 2 (two) times daily.   OVER THE COUNTER MEDICATION CBD Oil       All past medical history, surgical history, allergies, family history, immunizations andmedications were updated in the EMR today and reviewed under the history and medication portions of their EMR.     ROS: Negative, with the exception of above mentioned in HPI   Objective:  BP 105/69 (BP Location: Left Arm, Patient Position: Sitting, Cuff Size: Normal)   Pulse 61   Temp 98.3 F (36.8 C) (Temporal)   Resp 18   Ht 5\' 1"  (1.549 m)   Wt 119 lb 2 oz (54 kg)   SpO2 98%   BMI 22.51 kg/m  Body mass index is 22.51 kg/m. Gen: Afebrile. No acute distress. Nontoxic in appearance, well developed, well nourished.  Very pleasant female. HENT: AT. [place].  MMM Eyes:Pupils Equal Round Reactive to light, Extraocular movements intact,  Conjunctiva without redness, discharge or icterus. MSK: Walking with cane.  Right upper extremity weakness.  Does have mild grip strength.  Subluxation of right shoulder at rest unless held in place by left hand.  Abduction with right upper extremity with very limited range of motion, although improved.  Mostly using her trapezius.  Right AFO in place.  Able to walk short distances without a cane.  Mild right knee instability. Neuro:  Alert. Oriented x3.  Aphasia present, but able to understand the majority of her conversation without needing her to repeat. Psych: Normal affect, dress and demeanor. Normal thought content and judgment.  No exam data present No results found. No results found for this or any previous visit (from the past 24 hour(s)).  Assessment/Plan: Margaret Ortiz is a 58 y.o. female  present for OV for  Right spastic hemiplegia (HCC)/Polyarthralgia/Encounter for chronic pain/chronic right shoulder pain/ Pain of right lower extremity Patient struggling with chronic pain on the right upper and lower extremity secondary to the stroke she sustained November 2018.  She had improvement with physical therapy and would like to restart physical therapy at the [facility] location.  -She was tried on tramadol briefly in the past and had some benefit will restart for chronic pain. -NSAIDs contraindicated secondary to chronic anticoagulation -Tylenol base medicines contraindicated secondary to elevated liver enzymes -Controlled substance contract signed today for tramadol. -UDS collected today for start  of controlled substance. -[place] controlled substance database was reviewed. -Physical therapy order was placed for [facility] location. - Pain Mgmt, Profile 8 w/Conf, U - Ambulatory referral to Physical Therapy -Patient understands she needs to follow-up every 6 months face to face, if desiring refills on medication.   Reviewed expectations re: course of current medical issues.  Discussed self-management of symptoms.  Outlined signs and symptoms indicating need for more acute intervention.  Patient verbalized understanding and all questions were answered.  Patient received an After-Visit Summary.    No orders of the defined types were placed in this encounter.  > 25 minutes spent with patient, >50% of time spent face to face     Note is dictated utilizing voice recognition software. Although note has been proof read prior to signing, occasional typographical errors still can be missed. If any questions arise, please do not hesitate to call for verification.   electronically signed by:  Howard Pouch, DO  [place]

## 2019-01-21 NOTE — Telephone Encounter (Signed)
Received faxed paperwork from [facility] for LTD paperwork. Medical records have been requested by Beverlee Nims from 2019 to present. Please advise if patient needs appt for paperwork or if we are able to do this paperwork for patient. Placed on Providers desk to review.   Thanks

## 2019-01-21 NOTE — Patient Instructions (Signed)
STOP all tylenol use Start the new medication called tramadol called into your pharmacy. You have enough st your pharmacy for 6 months total.  You can take it every 12 hours if needed. This is a controlled substance so you will need to be seen in person every 6 months if desiring refills.   I will refer you to the [facility] PT- they will call you.

## 2019-01-22 ENCOUNTER — Telehealth: Payer: Self-pay | Admitting: Family Medicine

## 2019-01-22 ENCOUNTER — Encounter: Payer: Self-pay | Admitting: Family Medicine

## 2019-01-22 DIAGNOSIS — G8929 Other chronic pain: Secondary | ICD-10-CM | POA: Insufficient documentation

## 2019-01-22 NOTE — Telephone Encounter (Signed)
Please call patient.  During her visit last week she had asked if she she needed to have follow-up on a lung lesion or nodule, that was being followed prior to when she moved here in 2019. The only records we received when she moved here was surrounding her hospital admission and her cardiologist from the time surrounding her stroke.  There is no mention in her records that was received of any lung nodule nor any CT report to review. Please ask her who diagnosed the lung nodule/lesion, where and see if we can get the records surrounding that issue so that we can guide her on what is needed moving forward.

## 2019-01-23 LAB — PAIN MGMT, PROFILE 8 W/CONF, U
6 Acetylmorphine: NEGATIVE ng/mL
Alcohol Metabolites: NEGATIVE ng/mL
Amphetamines: NEGATIVE ng/mL
Benzodiazepines: NEGATIVE ng/mL
Buprenorphine, Urine: NEGATIVE ng/mL
Cocaine Metabolite: NEGATIVE ng/mL
Creatinine: 53.4 mg/dL
MDMA: NEGATIVE ng/mL
Marijuana Metabolite: 20 ng/mL
Marijuana Metabolite: POSITIVE ng/mL
Opiates: NEGATIVE ng/mL
Oxidant: NEGATIVE ug/mL
Oxycodone: NEGATIVE ng/mL
pH: 5.8 (ref 4.5–9.0)

## 2019-01-25 NOTE — Telephone Encounter (Signed)
Pt was called and she cannot remember the MD that dx or did the testing for her lung nodule/lesion. She has no records but was asked if she found any to let us know.   Please advise

## 2019-01-28 NOTE — Telephone Encounter (Signed)
She will need an appt. Can be virtual-  the answers possibly could have changed from when we first filled these out for her 05/18/2018.

## 2019-01-28 NOTE — Telephone Encounter (Signed)
Pt was called and placed on schedule for VV. Pt verbalized understanding

## 2019-02-02 ENCOUNTER — Encounter: Payer: Self-pay | Admitting: Family Medicine

## 2019-02-02 ENCOUNTER — Other Ambulatory Visit: Payer: Self-pay

## 2019-02-02 ENCOUNTER — Telehealth: Payer: Self-pay | Admitting: Family Medicine

## 2019-02-02 ENCOUNTER — Ambulatory Visit (INDEPENDENT_AMBULATORY_CARE_PROVIDER_SITE_OTHER): Payer: BC Managed Care – PPO | Admitting: Family Medicine

## 2019-02-02 VITALS — BP 125/69 | HR 71 | Ht 61.0 in | Wt 119.0 lb

## 2019-02-02 DIAGNOSIS — I69321 Dysphasia following cerebral infarction: Secondary | ICD-10-CM | POA: Diagnosis not present

## 2019-02-02 DIAGNOSIS — G8111 Spastic hemiplegia affecting right dominant side: Secondary | ICD-10-CM | POA: Diagnosis not present

## 2019-02-02 DIAGNOSIS — I69351 Hemiplegia and hemiparesis following cerebral infarction affecting right dominant side: Secondary | ICD-10-CM | POA: Diagnosis not present

## 2019-02-02 DIAGNOSIS — I69398 Other sequelae of cerebral infarction: Secondary | ICD-10-CM

## 2019-02-02 DIAGNOSIS — I639 Cerebral infarction, unspecified: Secondary | ICD-10-CM

## 2019-02-02 DIAGNOSIS — Z8673 Personal history of transient ischemic attack (TIA), and cerebral infarction without residual deficits: Secondary | ICD-10-CM

## 2019-02-02 DIAGNOSIS — M792 Neuralgia and neuritis, unspecified: Secondary | ICD-10-CM

## 2019-02-02 NOTE — Telephone Encounter (Signed)
Forms completed with patient today.  Please complete the requests of medical records and fax.  Retain a copy to be scanned.

## 2019-02-02 NOTE — Telephone Encounter (Signed)
Placed on Margaret Ortiz's desk, she has already requested records.  Margaret Ortiz this will all need to be faxed to the number and copy needs to be placed in chart after getting the records.   Thanks

## 2019-02-02 NOTE — Telephone Encounter (Signed)
I would assume "terminal" would mean a condition such as cancer or a condition that is expected to end one's life.  She has a permanent condition, that will be life long but not terminal.  Again, that is my take on insurance lingo.

## 2019-02-02 NOTE — Telephone Encounter (Signed)
Pt sent the following my chart message after visit: Dr. Raoul Pitch, This what [facility] insurance response regarding the heath assessments is 3-6 month or Will extended to .......: Can you help me understand the last sentence? "Good afternoon, Standard practice for Long Term Disability is every 3-6 months unless there is a terminal condition in which case that timeframe would be extended."  Please advise, thx

## 2019-02-02 NOTE — Progress Notes (Signed)
VIRTUAL VISIT VIA VIDEO  I connected with Margaret Ortiz on 02/02/19 at 11:30 AM EDT by a video enabled telemedicine application and verified that I am speaking with the correct person using two identifiers. Location patient: Home Location provider: [facility], Office Persons participating in the virtual visit: Patient, Dr. Raoul Pitch and R.Baker, LPN  I discussed the limitations of evaluation and management by telemedicine and the availability of in person appointments. The patient expressed understanding and agreed to proceed.   SUBJECTIVE Chief Complaint  Patient presents with  . LTD paperwork    Pt needs LTD forms filled out.     HPI: Margaret Ortiz is a 58 y.o. female present to discuss her LTD forms from [facility]. This paperwork was completed initially by this provider 05/18/2018.  Patient sustained a significant cardioembolic stroke on May 19, 2017.  She continues to have hemiparesis of her right dominant side secondary to the stroke.  This is a permanent condition.  Patient's condition will not improve or return to her normal state prior to her stroke.  In addition to continued hemiparesis, weakness, gait instability, dysphasia she also suffers from chronic pain status post stroke. She forms physical therapy and Occupational Therapy.  She is under the care of PMR/physical med Dr. Letta Pate, cardiology Dr. Larae Grooms and Neurology Dr. Metta Clines.   ROS: See pertinent positives and negatives per HPI.  Patient Active Problem List   Diagnosis Date Noted  . Encounter for chronic pain management 01/22/2019  . Right spastic hemiplegia ([place]) 11/27/2018  . Elevated LFTs 11/20/2018  . 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 Unintentional weight loss 11/05/2018  . Fatigue 11/05/2018  . Bloody stool 11/05/2018  . Polyarthralgia 11/05/2018  . Chronic anticoagulation 11/05/2018  . Difficulty coping with disease  03/31/2018  . Hemiparesis of right dominant side as late effect of cerebral infarction ([place]) 09/17/2017  . Cardioembolic stroke ([place]) 81/19/1478  . 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 Cardiomyopathy ([place]) 09/17/2017  . H/O ischemic left MCA stroke 09/17/2017  . Cardiac LV ejection fraction 21-30%   . Graves disease   . Plaque psoriasis   . Neuropathy 05/19/2017    Social History   Tobacco Use  . Smoking status: Former Research scientist (life sciences)  . Smokeless tobacco: Never Used  Substance Use Topics  . Alcohol use: Never    Frequency: Never    Current Outpatient Medications:  .  apixaban (ELIQUIS) 5 MG TABS tablet, Take 1 tablet (5 mg total) by mouth 2 (two) times daily., Disp: 180 tablet, Rfl: 3 .  atorvastatin (LIPITOR) 80 MG tablet, Take 1 tablet (80 mg total) by mouth daily., Disp: 90 tablet, Rfl: 3 .  carvedilol (COREG) 3.125 MG tablet, Take 1 tablet (3.125 mg total) by mouth 2 (two) times daily., Disp: 180 tablet, Rfl: 3 .  clobetasol cream (TEMOVATE) 2.95 %, Apply 1 application topically 2 (two) times daily., Disp: 30 g, Rfl: 5 .  esomeprazole (NEXIUM) 40 MG capsule, Take 1 capsule (40 mg total) by mouth daily at 12 noon., Disp: 90 capsule, Rfl: 3 .  gabapentin (NEURONTIN) 300 MG capsule, Take 1 cap in the am, and 2 cap in the pm (Patient taking differently: Take 1 cap in the am, 1 at lunch, and 2 cap in the pm), Disp: 90 capsule, Rfl: 3 .  OVER THE COUNTER MEDICATION, CBD Oil, Disp: , Rfl:  .  traMADol (ULTRAM) 50 MG tablet, Take 1  tablet (50 mg total) by mouth every 12 (twelve) hours as needed., Disp: 60 tablet, Rfl: 5  Allergies  Allergen Reactions  . Dust Mite Extract   . Penicillins     OBJECTIVE: BP 125/69   Pulse 71   Ht 5\' 1"  (1.549 m)   Wt 119 lb (54 kg)   BMI 22.48 kg/m  Gen: No acute distress. Nontoxic in appearance. Tearful today. States she is in more pain today  HENT: AT. [place].  MMM.  Eyes:Pupils Equal Round Reactive to light,  Extraocular movements intact,  Conjunctiva without redness, discharge or icterus. Chest: Cough or shortness of breath not present.  Neuro: Able to walk short distance with Cane. Alert. Oriented x3 . Slurred speech.  Psych: Normal affect, dress and demeanor. Normal thought content and judgment.  ASSESSMENT AND PLAN: Margaret Ortiz is a 58 y.o. 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  This will be faxed with the requested medical records.  She is followed by multiple specialist routinely, physical therapy and PCP for the above conditions.   - This is a permanent condition.     Howard Pouch, DO 02/02/2019

## 2019-02-02 NOTE — Telephone Encounter (Signed)
Faxed completed form to [employer]. Record request was sent to our medical records office to process on 01/20/19. I put the medical records phone number on the cover sheet that was sent with completed form in case their office needs to follow up on record request.

## 2019-02-08 ENCOUNTER — Encounter: Payer: Self-pay | Admitting: Family Medicine

## 2019-02-08 ENCOUNTER — Telehealth: Payer: Self-pay

## 2019-02-08 NOTE — Telephone Encounter (Signed)
Received fax from Physical therapy of [facility]. Performed eval and asking for 2x week over the next 6-8 weeks for ROM, education, gait training. Placed on Providers desk for signature.

## 2019-02-08 NOTE — Telephone Encounter (Signed)
Printed off forms pt has sent- Second page of the two has been completed already. Placed on Dr Lucita Lora desk for review and to fill out top page.   Diane it looks like patient has not received medical records yet. Can you look into this or give me a contact for the patient to call to check on the status of this?   Thanks

## 2019-02-12 ENCOUNTER — Telehealth: Payer: Self-pay | Admitting: Interventional Cardiology

## 2019-02-12 NOTE — Telephone Encounter (Signed)
Documents printed for provider completion

## 2019-02-12 NOTE — Telephone Encounter (Signed)
FMLA form received. Placed in box for Dr. Irish Lack to review. 02/12/19 vlm

## 2019-02-16 ENCOUNTER — Telehealth: Payer: Self-pay | Admitting: Interventional Cardiology

## 2019-02-16 NOTE — Telephone Encounter (Signed)
Signed FMLA form returned to [place] at [place]. 02/16/19 vlm

## 2019-02-19 ENCOUNTER — Encounter: Payer: Self-pay | Admitting: Neurology

## 2019-02-19 ENCOUNTER — Telehealth: Payer: Self-pay | Admitting: *Deleted

## 2019-02-19 ENCOUNTER — Encounter: Payer: Self-pay | Admitting: Physical Medicine & Rehabilitation

## 2019-02-19 ENCOUNTER — Encounter
Payer: BC Managed Care – PPO | Attending: Physical Medicine & Rehabilitation | Admitting: Physical Medicine & Rehabilitation

## 2019-02-19 ENCOUNTER — Other Ambulatory Visit: Payer: Self-pay

## 2019-02-19 VITALS — BP 107/62 | HR 65 | Temp 98.1°F | Ht 67.0 in | Wt 118.0 lb

## 2019-02-19 DIAGNOSIS — G8111 Spastic hemiplegia affecting right dominant side: Secondary | ICD-10-CM | POA: Diagnosis present

## 2019-02-19 NOTE — Patient Instructions (Addendum)
Botox 100 U next visit  Referral to [facility] to evaluate for functional electric stimulation orthosis

## 2019-02-19 NOTE — Progress Notes (Signed)
Subjective:    Patient ID: Margaret Ortiz, female    DOB: 02-20-1961, 58 y.o.   MRN: [id number]  HPI 58 year old female with history of left MCA distribution infarct onset May 19, 2017.  She has cardiomyopathy initially required NG tube for swallowing dysfunction.  Inpatient rehab from December 4 through July 01, 2017.  She moved to [place] in 2019.  Established with her primary care doctor [facility] and sees her neurologist Dr. Tomi Likens.  Patient has concerns about her orthosis.  We discussed that she will likely need a orthotic for her right lower extremity for lifetime.  We discussed that in addition to the carbon fiber orthosis that she already has there are functional electrical orthoses that require activation of the tibialis anterior muscle by direct stimulation  She continues to complain of weakness in her arm.  No increased tone in her fingers or wrist area since Botox injection. Pain Inventory Average Pain 0 Pain Right Now 0 My pain is na  In the last 24 hours, has pain interfered with the following? General activity 0 Relation with others 0 Enjoyment of life 0 What TIME of day is your pain at its worst? na Sleep (in general) Poor  Pain is worse with: na Pain improves with: na Relief from Meds: na  Mobility walk with assistance use a cane ability to climb steps?  no do you drive?  no  Function not employed: date last employed . disabled: date disabled .Marland Kitchen  Neuro/Psych bladder control problems weakness numbness trouble walking spasms  Prior Studies Any changes since last visit?  no  Physicians involved in your care Any changes since last visit?  no   Family History  Problem Relation Age of Onset  . Hyperlipidemia Mother   . Hypertension Mother   . Early death Father   . Hypertension Sister   . Heart attack Brother   . Mental illness Brother    Social History   Socioeconomic History  . Marital status: Divorced    Spouse name: Not on file   . Number of children: 1  . Years of education: 60  . Highest education level: Professional school degree (e.g., MD, DDS, DVM, JD)  Occupational History  . 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 Financial resource strain: Not on file  . Food insecurity    Worry: Not on file    Inability: Not on file  . Transportation needs    Medical: Not on file    Non-medical: Not on file  Tobacco Use  . Smoking status: Former Research scientist (life sciences)  . Smokeless tobacco: Never Used  Substance and Sexual Activity  . Alcohol use: Never    Frequency: Never  . Drug use: Never  . Sexual activity: Not Currently  Lifestyle  . Physical activity    Days per week: Not on file    Minutes per session: Not on file  . Stress: Not on file  Relationships  . Social Herbalist on phone: Not on file    Gets together: Not on file    Attends religious service: Not on file    Active member of club or organization: Not on file    Attends meetings of clubs or organizations: Not on file    Relationship status: Not on file  Other Topics Concern  . Not on file  Social History Narrative   Divorced.  From [place], moved to [place] to stay with her family after having a stroke March  2019   Graduate degree, works as a Community education officer.   Former smoker.   Exercise routinely prior to stroke.   Wears a hearing aid.   Smoke alarms in the home.   Wears her seatbelt.      Patient is right-handed. She drinks one cup of coffee a day. She now resides with her sister and brother-in-law.   Past Surgical History:  Procedure Laterality Date  . NO PAST SURGERIES     Past Medical History:  Diagnosis Date  . Aphasia 09/17/2017  . 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  . Cardiac LV ejection fraction 21-30%   . Cardioembolic stroke ([place]) [id number]   Right hemiparesis  . Cardiomyopathy ([place]) 04/2017  . Dysphagia 05/19/2017    Last known diet upgraded to regular diet with thin liquids on 06/27/2017, no records available  . 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 Fall 09/17/2017  . Graves disease   . H/O ischemic left MCA stroke 09/17/2017  . Hemiparesis of right dominant side as late effect of cerebral infarction ([place]) 09/17/2017  . Hyperlipidemia   . Hypertension   . Neuropathy 05/19/2017   s/p stroke  . Plaque psoriasis   . Shoulder subluxation, right, initial encounter 09/17/2017  . Weakness 09/17/2017   There were no vitals taken for this visit.  Opioid Risk Score:   Fall Risk Score:  `1  Depression screen PHQ 2/9  Depression screen PHQ 2/9 09/16/2017  Decreased Interest 0  Down, Depressed, Hopeless 0  PHQ - 2 Score 0     Review of Systems  Constitutional: Negative.   HENT: Negative.   Eyes: Negative.   Respiratory: Negative.   Cardiovascular: Negative.   Gastrointestinal: Negative.   Endocrine: Negative.   Genitourinary: Positive for difficulty urinating.  Musculoskeletal: Positive for arthralgias, gait problem and myalgias.  Skin: Negative.   Allergic/Immunologic: Negative.   Neurological: Positive for dizziness, weakness and numbness.  Hematological: Negative.   Psychiatric/Behavioral: Negative.   All other systems reviewed and are negative.      Objective:   Physical Exam Positive shoulder subluxation right shoulder. No pain with shoulder range of motion.  No tenderness over the upper trapezius. Tone MAS 2/3 in the right elbow flexors.  There is velocity dependent tone. Right hand wrist and fingers all have Ashworth 0  Gait is using AFO as well as a cane.  She clears her foot well, mild knee instability     Assessment & Plan:  1.  Right spastic hemiplegia her tone is mainly in her elbow flexors at this point.  We will set her up for Botox 100 units divided into 50 units in the brachioradialis and 50 units in the biceps. At this point I do not see need for  reinjection of the finger or wrist flexors.  No evidence of pronator spasticity. We will see her back in approximate 4 weeks for the injection 2.  Foot drop at patient's request will refer for the functional electrical orthosis evaluation.  It will depend whether or not the TA can be stimulated and cause adequate dorsiflexion to clear the foot.  Also in her case, her knee instability may not allow use of this type of orthosis. 3.  Right total of subluxation currently without pain.  She states she is taken some pain medications however.

## 2019-02-19 NOTE — Telephone Encounter (Signed)
Patient saw Dr. Letta Pate today and informed MD about new numbness on right side of face. He told her to contact Dr. Tomi Likens (MD also sent Dr. Tomi Likens message which I called patient and followed up on).  Patient stated numbness started yesterday on right side of face but no where else. Has not gotten worse. She is able to eat/swallow okay. Informed patient if more symptoms appear -numbness in arm, balance issues, slurred speech, vision problems to go to the ED. Verbalized understanding.   Patient scheduled for virtual visit with MD on Monday am. Called patient and updated chart.

## 2019-02-21 NOTE — Progress Notes (Signed)
Virtual Visit via Video Note The purpose of this virtual visit is to provide medical care while limiting exposure to the novel coronavirus.    Consent was obtained for video visit:  Yes Answered questions that patient had about telehealth interaction:  Yes I discussed the limitations, risks, security and privacy concerns of performing an evaluation and management service by telemedicine. I also discussed with the patient that there may be a patient responsible charge related to this service. The patient expressed understanding and agreed to proceed.  Pt location: Home Physician Location: office Name of referring provider:  Ma Hillock, DO I connected with Margaret Ortiz at patients initiation/request on 02/22/2019 at  9:50 AM EDT by video enabled telemedicine application and verified that I am speaking with the correct person using two identifiers. Pt MRN:  [id number] Pt DOB:  [birth year] Video Participants:  Margaret Ortiz   History of Present Illness:  Margaret Ortiz is a 58 year old right-handed female with Grave's disease, plaque psoriasis, cardiomyopathy and residual right sided weakness, aphasia and dysarthriasecondary to left MCA stroke who follows up for new facial numbness.  Due to aphasia, history is limited and she states nobody is with her to help explain symptoms in greater detail.    UPDATE: Current medications: Eliquis, atorvastatin 80mg , gabapentin 300mg /300mg /600mg , baclofen 5mg .  Receiving Botox by Dr. Letta Pate.  On 02/18/19, she developed right facial numbness, as well as bilateral occipital headache.  Headache comes and goes and responds to tramadol.  She says she thinks her speech is worse.  She continues to have right sided shoulder pain.    HISTORY: She was admitted to [facility] 05/19/17 for left MCA stroke. She was found to have a thrombus on imaging but was determined not to be a candidate for intervention due to completed stroke. She was  diagnosed with cardiomyopathy as echocardiogram demonstrated a LV EF of 15-20%. Other testing is not available to me at this time. Repeat echocardiogram on 11/30 showed improved EF of 20-25%. It was believed that her stroke was likely cardioembolic secondary to undiagnosed cardiomyopathy. She was started on Coumadinand subsequently. She had developed significant dysphagia. MBS on 06/27/17 indicated that her diet could be upgraded to regular diet with thin liquids. She was admitted to inpatient rehab until March.  She has since moved to [place] to [place] her sister until she recovers well enough to move back to [place] alone. She continues to have residual right sided upper and lower extremity weakness.She is currently undergoing outpatient rehab(speech/PT/OT). Speech is improved. She is swallowing well and is on a regular diet. Strength in her right leg is improved and is now able to ambulate with cane. She is still plegic in the right arm. She is independent and manages her finances and performs ADLs.   Echocardiogram from 02/03/18 showed EF 25-30% with severe global LV systolic dysfunction, moderate diastolic dysfunction, mild MR and mild LAE.  For spasticity and pain, she receives Botox from Dr. Letta Pate.  Baclofen ineffective.  Past Medical History: Past Medical History:  Diagnosis Date  . Aphasia 09/17/2017  . 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  . Cardiac LV ejection fraction 21-30%   . Cardioembolic stroke ([place]) [id number]   Right hemiparesis  . Cardiomyopathy ([place]) 04/2017  . Dysphagia 05/19/2017   Last known diet upgraded to regular diet with thin liquids on 06/27/2017, no records available  . Dysphagia as late  effect of cerebral aneurysm 09/17/2017  . Fall 09/17/2017  . Graves disease   . H/O ischemic left MCA stroke 09/17/2017  . Hemiparesis of right dominant side  as late effect of cerebral infarction ([place]) 09/17/2017  . Hyperlipidemia   . Hypertension   . Neuropathy 05/19/2017   s/p stroke  . Plaque psoriasis   . Shoulder subluxation, right, initial encounter 09/17/2017  . Weakness 09/17/2017    Medications: Outpatient Encounter Medications as of 02/22/2019  Medication Sig  . apixaban (ELIQUIS) 5 MG TABS tablet Take 1 tablet (5 mg total) by mouth 2 (two) times daily.  Marland Kitchen atorvastatin (LIPITOR) 80 MG tablet Take 1 tablet (80 mg total) by mouth daily.  . carvedilol (COREG) 3.125 MG tablet Take 1 tablet (3.125 mg total) by mouth 2 (two) times daily.  . clobetasol cream (TEMOVATE) [id number] % Apply 1 application topically 2 (two) times daily.  Marland Kitchen esomeprazole (NEXIUM) 40 MG capsule Take 1 capsule (40 mg total) by mouth daily at 12 noon.  . gabapentin (NEURONTIN) 300 MG capsule Take 1 cap in the am, and 2 cap in the pm (Patient taking differently: Take 1 cap in the am, 1 at lunch, and 2 cap in the pm)  . OVER THE COUNTER MEDICATION CBD Oil  . traMADol (ULTRAM) 50 MG tablet Take 1 tablet (50 mg total) by mouth every 12 (twelve) hours as needed.   No facility-administered encounter medications on file as of 02/22/2019.     Allergies: Allergies  Allergen Reactions  . Dust Mite Extract   . Penicillins     Family History: Family History  Problem Relation Age of Onset  . Hyperlipidemia Mother   . Hypertension Mother   . Early death Father   . Hypertension Sister   . Heart attack Brother   . Mental illness Brother     Social History: Social History   Socioeconomic History  . Marital status: Divorced    Spouse name: Not on file  . Number of children: 1  . Years of education: 67  . Highest education level: Professional school degree (e.g., MD, DDS, DVM, JD)  Occupational History  . 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 Financial resource strain: Not on file  . Food insecurity    Worry: Not on file    Inability: Not on file  .  Transportation needs    Medical: Not on file    Non-medical: Not on file  Tobacco Use  . Smoking status: Former Research scientist (life sciences)  . Smokeless tobacco: Never Used  Substance and Sexual Activity  . Alcohol use: Never    Frequency: Never  . Drug use: Never  . Sexual activity: Not Currently  Lifestyle  . Physical activity    Days per week: Not on file    Minutes per session: Not on file  . Stress: Not on file  Relationships  . Social Herbalist on phone: Not on file    Gets together: Not on file    Attends religious service: Not on file    Active member of club or organization: Not on file    Attends meetings of clubs or organizations: Not on file    Relationship status: Not on file  . Intimate partner violence    Fear of current or ex partner: Not on file    Emotionally abused: Not on file    Physically abused: Not on file    Forced sexual activity: Not on file  Other  Topics Concern  . Not on file  Social History Narrative   Divorced.  From [place], moved to [place] to stay with her family after having a stroke March 2019   Graduate degree, works as a Community education officer.   Former smoker.   Exercise routinely prior to stroke.   Wears a hearing aid.   Smoke alarms in the home.   Wears her seatbelt.      Patient is right-handed. She drinks two cup of coffee a day. She now resides with her sister and brother-in-law.    Observations/Objective:   Height 5\' 2"  (1.575 m), weight 118 lb (53.5 kg). No acute distress.  Alert and oriented.  Mild expressive aphasia.    Assessment and Plan:   1.  Right-sided facial numbness and occipital headaches.  Need to rule out new/recent recurrent stroke.  Occipital headaches may be due to myofascial neck and shoulder pain from spasticity. 2.  Right spastic hemiparesis and expressive aphasia secondary to left MCA territory infarct, likely cardioembolic due to cardiomyopathy.  1.  MRI of brain 2.  To help with headache and spastic pain, will  increase gabapentin to 600mg /300mg /600mg  daily 3.  Limit use of pain relievers to no more than 2 days out of week to prevent risk of rebound or medication-overuse headache. 4.  Follow up in 5 months.  Follow Up Instructions:    -I discussed the assessment and treatment plan with the patient. The patient was provided an opportunity to ask questions and all were answered. The patient agreed with the plan and demonstrated an understanding of the instructions.   The patient was advised to call back or seek an in-person evaluation if the symptoms worsen or if the condition fails to improve as anticipated.    Total Time spent in visit with the patient was:  11 minutes  Dudley Major, DO

## 2019-02-22 ENCOUNTER — Other Ambulatory Visit: Payer: Self-pay

## 2019-02-22 ENCOUNTER — Encounter: Payer: Self-pay | Admitting: Neurology

## 2019-02-22 ENCOUNTER — Telehealth (INDEPENDENT_AMBULATORY_CARE_PROVIDER_SITE_OTHER): Payer: BC Managed Care – PPO | Admitting: Neurology

## 2019-02-22 VITALS — Ht 62.0 in | Wt 118.0 lb

## 2019-02-22 DIAGNOSIS — R200 Anesthesia of skin: Secondary | ICD-10-CM | POA: Diagnosis not present

## 2019-02-22 DIAGNOSIS — R519 Headache, unspecified: Secondary | ICD-10-CM

## 2019-02-22 NOTE — Addendum Note (Signed)
Addended by: Jesse Fall on: 02/22/2019 01:17 PM   Modules accepted: Orders

## 2019-02-23 NOTE — Telephone Encounter (Signed)
Paperwork completed and turned into Medical Records last week.

## 2019-02-24 ENCOUNTER — Telehealth: Payer: Self-pay | Admitting: *Deleted

## 2019-02-24 ENCOUNTER — Encounter: Payer: Self-pay | Admitting: Family Medicine

## 2019-02-24 DIAGNOSIS — I639 Cerebral infarction, unspecified: Secondary | ICD-10-CM

## 2019-02-24 DIAGNOSIS — I69320 Aphasia following cerebral infarction: Secondary | ICD-10-CM

## 2019-02-24 DIAGNOSIS — I69351 Hemiplegia and hemiparesis following cerebral infarction affecting right dominant side: Secondary | ICD-10-CM

## 2019-02-24 DIAGNOSIS — I63412 Cerebral infarction due to embolism of left middle cerebral artery: Secondary | ICD-10-CM

## 2019-02-24 DIAGNOSIS — Z8673 Personal history of transient ischemic attack (TIA), and cerebral infarction without residual deficits: Secondary | ICD-10-CM

## 2019-02-24 DIAGNOSIS — R519 Headache, unspecified: Secondary | ICD-10-CM

## 2019-02-24 DIAGNOSIS — R200 Anesthesia of skin: Secondary | ICD-10-CM

## 2019-02-24 NOTE — Telephone Encounter (Signed)
Message from patient

## 2019-02-24 NOTE — Telephone Encounter (Signed)
Patient had sent to office "attending physician's statement" progress report to be filled out for [facility]. The second page needs to be filled out by PT for functional capacity evaluation. Called and spoke to [employer] (referral coord) at [facility] on [address].   PT order entered for functional capacity eval. Patient had sent my chart message to this office and I responded to it telling her above info and that the PT office will call her and set up an evaluation time.  Have faxed the 2nd page (functional assessment) to [facility] and fax confirmation received. She is aware of referral. When completed will be faxed back to [place] and patient made aware.  Dr. Tomi Likens - FYI above.

## 2019-03-02 NOTE — Progress Notes (Signed)
Cardiology Office Note   Date:  03/03/2019   ID:  Thomasene, Kurzawa 21-Nov-1960, MRN [id number]  PCP:  Howard Pouch A, DO    No chief complaint on file.  Chronic systolic heart failure:  Wt Readings from Last 3 Encounters:  03/03/19 117 lb (53.1 kg)  02/19/19 118 lb (53.5 kg)  02/19/19 118 lb (53.5 kg)       History of Present Illness: Marlaine Dark is a 58 y.o. female  Who had a stroke in November 2018.  Per the records: "Patient was found down May 19, 2017 was found to be secondary to a left MCA stroke. Was felt to likely have been cardioembolic from undiagnosed cardiomyopathy since her ejection fraction was found to be 15-20%. Repeat echo on November 30, 2018showed improved ejection fraction of 20-25%. She suffered from dysphasia required an NG tubeintially. SHe was admitted to acute rehab December 4 to July 01, 2017. She was upgraded to a regular diet with thin liquids via MBS on June 27, 2017. Apparently ambulated with assistance at acute rehab. She had been following in the outpatient setting withvisits including cardiology and neurology appointments. Otherwise she stayed at a [facility] ([facility]) until 09/11/2017 when she was discharged and moved to [facility]. Cardiomyopathy, unspecified type (HCC)/Cardiac LV ejection fraction 21-30%"  Recent echocardiogram done in [place] in February 2019 showed ejection fraction 15 to 20% with mild to moderate mitral regurgitation.  Repeat echo in 01/2018 showed: Impressions:  - Severe global reduction in LV systolic function; moderate diastolic dysfunction; mild MR; mild LAE. Despite the low EF, she had been walking 30-40 minutes at a time, several times a day.  She is always very interested in the LVEF and trying to get this higher.  Med titration has been limited by low BP.  She has typically been class I.  Since the last visit, she continues to do relatively well.  Denies : Chest pain. Dizziness. Leg edema. Nitroglycerin  use. Orthopnea. Palpitations. Paroxysmal nocturnal dyspnea. Shortness of breath. Syncope.    Persistent right sided weakness.  She is considering moving back to [place].  She used to work at [employer].     Past Medical History:  Diagnosis Date  . Aphasia 09/17/2017  . 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  . Cardiac LV ejection fraction 21-30%   . Cardioembolic stroke ([place]) [id number]   Right hemiparesis  . Cardiomyopathy ([place]) 04/2017  . Dysphagia 05/19/2017   Last known diet upgraded to regular diet with thin liquids on 06/27/2017, no records available  . 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 Fall 09/17/2017  . Graves disease   . H/O ischemic left MCA stroke 09/17/2017  . Hemiparesis of right dominant side as late effect of cerebral infarction ([place]) 09/17/2017  . Hyperlipidemia   . Hypertension   . Neuropathy 05/19/2017   s/p stroke  . Plaque psoriasis   . Shoulder subluxation, right, initial encounter 09/17/2017  . Weakness 09/17/2017    Past Surgical History:  Procedure Laterality Date  . NO PAST SURGERIES       Current Outpatient Medications  Medication Sig Dispense Refill  . apixaban (ELIQUIS) 5 MG TABS tablet Take 1 tablet (5 mg total) by mouth 2 (two) times daily. 180 tablet 3  . atorvastatin (LIPITOR) 80 MG tablet Take 1 tablet (80 mg total) by mouth daily. 90 tablet 3  . carvedilol (COREG) 3.125  MG tablet Take 1 tablet (3.125 mg total) by mouth 2 (two) times daily. 180 tablet 3  . clobetasol cream (TEMOVATE) [id number] % Apply 1 application topically 2 (two) times daily. 30 g 5  . esomeprazole (NEXIUM) 40 MG capsule Take 1 capsule (40 mg total) by mouth daily at 12 noon. 90 capsule 3  . gabapentin (NEURONTIN) 300 MG capsule Take 1 cap in the am, and 2 cap in the pm (Patient taking differently: Take 1 cap in the am, 1 at lunch, and 2 cap in the pm) 90  capsule 3  . OVER THE COUNTER MEDICATION CBD Oil    . traMADol (ULTRAM) 50 MG tablet Take 1 tablet (50 mg total) by mouth every 12 (twelve) hours as needed. 60 tablet 5   No current facility-administered medications for this visit.    Facility-Administered Medications Ordered in Other Visits  Medication Dose Route Frequency Provider Last Rate Last Dose  . perflutren lipid microspheres (DEFINITY) IV suspension  1-10 mL Intravenous PRN [name], MD   1 mL at 03/03/19 [id number]    Allergies:   Dust mite extract and Penicillins    Social History:  The patient  reports that she has quit smoking. She has never used smokeless tobacco. She reports that she does not drink alcohol or use drugs.   Family History:  The patient's family history includes Early death in her father; Heart attack in her brother; Hyperlipidemia in her mother; Hypertension in her mother and sister; Mental illness in her brother.    ROS:  Please see the history of present illness.   Otherwise, review of systems are positive for right sided weakness.   All other systems are reviewed and negative.    PHYSICAL EXAM: VS:  BP 100/64   Pulse 62   Ht 5\' 2"  (1.575 m)   Wt 117 lb (53.1 kg)   SpO2 98%   BMI 21.40 kg/m  , BMI Body mass index is 21.4 kg/m. GEN: Well nourished, well developed, in no acute distress  HEENT: normal  Neck: no JVD, carotid bruits, or masses Cardiac: RRR; no murmurs, rubs, or gallops,no edema  Respiratory:  clear to auscultation bilaterally, normal work of breathing GI: soft, nontender, nondistended, + BS MS: no deformity or atrophy  Skin: warm and dry, no rash Neuro:  Strength and sensation are intact Psych: euthymic mood, full affect   EKG:   The ekg ordered in March 2020 demonstrates NSR, nonspecific ST changes   Recent Labs: 11/06/2018: ALT 90; BUN 14; Creatinine, Ser 0.84; Hemoglobin 12.7; Platelets 248.0; Potassium 4.2; Sodium 142; TSH 1.39   Lipid Panel    Component Value  Date/Time   CHOL 177 06/22/2018 1043   TRIG 56.0 06/22/2018 1043   HDL 58.30 06/22/2018 1043   CHOLHDL 3 06/22/2018 1043   VLDL 11.2 06/22/2018 1043   LDLCALC 108 (H) 06/22/2018 1043     Other studies Reviewed: Additional studies/ records that were reviewed today with results demonstrating: I personally reviewed the echo images.  EF looks to be in the 25-30% range.  No apical thrombus.  Mild MR.   ASSESSMENT AND PLAN:  1. Chronic systolic heart failure: Have not used Entresto in the past since she has been class I and had a borderline low BP.  2. Prior CVA: residual right sided weakness and speech impairment. THis is her biggest issue in terms of disability.   3. Mild to moderate MR in the past.  4. Anticoagulated:  Tolerating Eliquis.  No bleeding issues.  CHeck labs today   Current medicines are reviewed at length with the patient today.  The patient concerns regarding her medicines were addressed.  The following changes have been made:  No change  Labs/ tests ordered today include:  No orders of the defined types were placed in this encounter.   Recommend 150 minutes/week of aerobic exercise Low fat, low carb, high fiber diet recommended  Disposition:   FU in 6 months   Signed, Larae Grooms, MD  03/03/2019 10:41 AM    [facility] [place], [place], [place]  [address] Phone: [phone]; Fax: [phone]

## 2019-03-03 ENCOUNTER — Ambulatory Visit (INDEPENDENT_AMBULATORY_CARE_PROVIDER_SITE_OTHER): Payer: BC Managed Care – PPO | Admitting: Interventional Cardiology

## 2019-03-03 ENCOUNTER — Other Ambulatory Visit: Payer: Self-pay

## 2019-03-03 ENCOUNTER — Encounter: Payer: Self-pay | Admitting: Interventional Cardiology

## 2019-03-03 ENCOUNTER — Ambulatory Visit (HOSPITAL_COMMUNITY): Payer: BC Managed Care – PPO | Attending: Cardiology

## 2019-03-03 VITALS — BP 100/64 | HR 62 | Ht 62.0 in | Wt 117.0 lb

## 2019-03-03 DIAGNOSIS — I5022 Chronic systolic (congestive) heart failure: Secondary | ICD-10-CM

## 2019-03-03 DIAGNOSIS — I340 Nonrheumatic mitral (valve) insufficiency: Secondary | ICD-10-CM

## 2019-03-03 DIAGNOSIS — R7989 Other specified abnormal findings of blood chemistry: Secondary | ICD-10-CM

## 2019-03-03 DIAGNOSIS — I69351 Hemiplegia and hemiparesis following cerebral infarction affecting right dominant side: Secondary | ICD-10-CM

## 2019-03-03 DIAGNOSIS — R945 Abnormal results of liver function studies: Secondary | ICD-10-CM

## 2019-03-03 DIAGNOSIS — I639 Cerebral infarction, unspecified: Secondary | ICD-10-CM

## 2019-03-03 DIAGNOSIS — I63412 Cerebral infarction due to embolism of left middle cerebral artery: Secondary | ICD-10-CM

## 2019-03-03 LAB — CBC
Hematocrit: 42.8 % (ref 34.0–46.6)
Hemoglobin: 13.8 g/dL (ref 11.1–15.9)
MCH: 30.5 pg (ref 26.6–33.0)
MCHC: 32.2 g/dL (ref 31.5–35.7)
MCV: 95 fL (ref 79–97)
Platelets: 269 10*3/uL (ref 150–450)
RBC: 4.53 x10E6/uL (ref 3.77–5.28)
RDW: 12.1 % (ref 11.7–15.4)
WBC: 5.7 10*3/uL (ref 3.4–10.8)

## 2019-03-03 LAB — COMPREHENSIVE METABOLIC PANEL
ALT: 37 IU/L — ABNORMAL HIGH (ref 0–32)
AST: 27 IU/L (ref 0–40)
Albumin/Globulin Ratio: 1.6 (ref 1.2–2.2)
Albumin: 4.4 g/dL (ref 3.8–4.9)
Alkaline Phosphatase: 156 IU/L — ABNORMAL HIGH (ref 39–117)
BUN/Creatinine Ratio: 13 (ref 9–23)
BUN: 13 mg/dL (ref 6–24)
Bilirubin Total: 0.6 mg/dL (ref 0.0–1.2)
CO2: 27 mmol/L (ref 20–29)
Calcium: 10.1 mg/dL (ref 8.7–10.2)
Chloride: 103 mmol/L (ref 96–106)
Creatinine, Ser: 1 mg/dL (ref 0.57–1.00)
GFR calc Af Amer: 72 mL/min/{1.73_m2} (ref >59)
GFR calc non Af Amer: 62 mL/min/{1.73_m2} (ref >59)
Globulin, Total: 2.8 g/dL (ref 1.5–4.5)
Glucose: 106 mg/dL — ABNORMAL HIGH (ref 65–99)
Potassium: 4.6 mmol/L (ref 3.5–5.2)
Sodium: 140 mmol/L (ref 134–144)
Total Protein: 7.2 g/dL (ref 6.0–8.5)

## 2019-03-03 LAB — LIPID PANEL
Chol/HDL Ratio: 2.7 ratio (ref 0.0–4.4)
Cholesterol, Total: 182 mg/dL (ref 100–199)
HDL: 67 mg/dL (ref >39)
LDL Chol Calc (NIH): 99 mg/dL (ref 0–99)
Triglycerides: 89 mg/dL (ref 0–149)
VLDL Cholesterol Cal: 16 mg/dL (ref 5–40)

## 2019-03-03 MED ORDER — PERFLUTREN LIPID MICROSPHERE
1.0000 mL | INTRAVENOUS | Status: AC | PRN
  Administered 2019-03-03: 10:00:00 1 mL via INTRAVENOUS

## 2019-03-03 NOTE — Patient Instructions (Signed)
Medication Instructions:  Your physician recommends that you continue on your current medications as directed. Please refer to the Current Medication list given to you today. If you need a refill on your cardiac medications before your next appointment, please call your pharmacy.   Lab work: Your physician recommends that you return for lab work in:   Today CMET, LIPIDS, CBC  If you have labs (blood work) drawn today and your tests are completely normal, you will receive your results only by: Marland Kitchen MyChart Message (if you have MyChart) OR . A paper copy in the mail If you have any lab test that is abnormal or we need to change your treatment, we will call you to review the results.  Testing/Procedures: None Ordered  Follow-Up: At [facility], you and your health needs are our priority.  As part of our continuing mission to provide you with exceptional heart care, we have created designated Provider Care Teams.  These Care Teams include your primary Cardiologist (physician) and Advanced Practice Providers (APPs -  Physician Assistants and Nurse Practitioners) who all work together to provide you with the care you need, when you need it. You will need a follow up appointment in 6 months.  Please call our office 2 months in advance to schedule this appointment.  You may see Dr. Irish Lack  or one of the following Advanced Practice Providers on your designated Care Team:   Merriman, PA-C Melina Copa, PA-C . Ermalinda Barrios, PA-C  Any Other Special Instructions Will Be Listed Below (If Applicable).

## 2019-03-19 ENCOUNTER — Other Ambulatory Visit: Payer: Self-pay

## 2019-03-19 ENCOUNTER — Telehealth: Payer: Self-pay

## 2019-03-19 ENCOUNTER — Encounter: Payer: Self-pay | Admitting: Physical Medicine & Rehabilitation

## 2019-03-19 ENCOUNTER — Encounter
Payer: BC Managed Care – PPO | Attending: Physical Medicine & Rehabilitation | Admitting: Physical Medicine & Rehabilitation

## 2019-03-19 VITALS — BP 112/76 | HR 68 | Temp 97.5°F | Ht 62.0 in | Wt 117.4 lb

## 2019-03-19 DIAGNOSIS — R4701 Aphasia: Secondary | ICD-10-CM

## 2019-03-19 DIAGNOSIS — G8111 Spastic hemiplegia affecting right dominant side: Secondary | ICD-10-CM | POA: Diagnosis not present

## 2019-03-19 NOTE — Telephone Encounter (Signed)
Copy made and sent to scan. Faxed to [facility]

## 2019-03-19 NOTE — Telephone Encounter (Signed)
Signed and returned

## 2019-03-19 NOTE — Progress Notes (Signed)
Subjective:    Patient ID: Margaret Ortiz, female    DOB: 08/17/1960, 58 y.o.   MRN: [id number] 58 year old female with history of left MCA distribution infarct onset May 19, 2017.  She has cardiomyopathy initially required NG tube for swallowing dysfunction.  Inpatient rehab from December 4 through July 01, 2017.  She moved to [place] in 2019. HPI   The patient remains severely aphasic although cannot communicate with repeated attempts to convey simple information. The patient was originally scheduled for botulinum toxin injection today.  She feels like her tone is not so bad in her elbow.  She also denies any kind of shoulder pain at the current time. She did receive a new AFO but does not have it with her today she is wearing her older AFO.   Pain Inventory Average Pain 6 Pain Right Now 1 My pain is intermittent, tingling and aching  In the last 24 hours, has pain interfered with the following? General activity 6 Relation with others 6 Enjoyment of life 6 What TIME of day is your pain at its worst? varies Sleep (in general) Good  Pain is worse with: some activites Pain improves with: medication Relief from Meds: 9  Mobility walk with assistance use a cane ability to climb steps?  yes do you drive?  no  Function disabled: date disabled na  Neuro/Psych weakness trouble walking  Prior Studies Any changes since last visit?  no  Physicians involved in your care Any changes since last visit?  no   Family History  Problem Relation Age of Onset  . Hyperlipidemia Mother   . Hypertension Mother   . Early death Father   . Hypertension Sister   . Heart attack Brother   . Mental illness Brother    Social History   Socioeconomic History  . Marital status: Divorced    Spouse name: Not on file  . Number of children: 1  . Years of education: 4  . Highest education level: Professional school degree (e.g., MD, DDS, DVM, JD)  Occupational History  .  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 Financial resource strain: Not on file  . Food insecurity    Worry: Not on file    Inability: Not on file  . Transportation needs    Medical: Not on file    Non-medical: Not on file  Tobacco Use  . Smoking status: Former Research scientist (life sciences)  . Smokeless tobacco: Never Used  Substance and Sexual Activity  . Alcohol use: Never    Frequency: Never  . Drug use: Never  . Sexual activity: Not Currently  Lifestyle  . Physical activity    Days per week: Not on file    Minutes per session: Not on file  . Stress: Not on file  Relationships  . Social Herbalist on phone: Not on file    Gets together: Not on file    Attends religious service: Not on file    Active member of club or organization: Not on file    Attends meetings of clubs or organizations: Not on file    Relationship status: Not on file  Other Topics Concern  . Not on file  Social History Narrative   Divorced.  From [place], moved to [place] to stay with her family after having a stroke March 2019   Graduate degree, works as a Community education officer.   Former smoker.   Exercise routinely prior to stroke.   Wears a hearing  aid.   Smoke alarms in the home.   Wears her seatbelt.      Patient is right-handed. She drinks two cup of coffee a day. She now resides with her sister and brother-in-law.   Past Surgical History:  Procedure Laterality Date  . NO PAST SURGERIES     Past Medical History:  Diagnosis Date  . Aphasia 09/17/2017  . 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  . Cardiac LV ejection fraction 21-30%   . Cardioembolic stroke ([place]) [id number]   Right hemiparesis  . Cardiomyopathy ([place]) 04/2017  . Dysphagia 05/19/2017   Last known diet upgraded to regular diet with thin liquids on 06/27/2017, no records available  . 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  Fall 09/17/2017  . Graves disease   . H/O ischemic left MCA stroke 09/17/2017  . Hemiparesis of right dominant side as late effect of cerebral infarction ([place]) 09/17/2017  . Hyperlipidemia   . Hypertension   . Neuropathy 05/19/2017   s/p stroke  . Plaque psoriasis   . Shoulder subluxation, right, initial encounter 09/17/2017  . Weakness 09/17/2017   BP 112/76   Pulse 68   Temp (!) 97.5 F (36.4 C)   Ht 5\' 2"  (1.575 m)   Wt 117 lb 6.4 oz (53.3 kg)   SpO2 98%   BMI 21.47 kg/m   Opioid Risk Score:   Fall Risk Score:  `1  Depression screen PHQ 2/9  Depression screen PHQ 2/9 09/16/2017  Decreased Interest 0  Down, Depressed, Hopeless 0  PHQ - 2 Score 0   Review of Systems  Constitutional: Negative.   HENT: Negative.   Eyes: Negative.   Respiratory: Negative.   Endocrine: Negative.   Genitourinary: Negative.   Musculoskeletal: Positive for gait problem.  Skin: Negative.   Allergic/Immunologic: Negative.   Neurological: Positive for weakness and numbness.  Hematological: Negative.   Psychiatric/Behavioral: Negative.   All other systems reviewed and are negative.      Objective:   Physical Exam   RIght deltoid  3- 3- elbow flexor 0/5 in Right hand and wrist Right lower extremity 3- hip knee extensor synergy 4- at the knee extension, 3- at the hip flexion. 0/5 in the right ankle dorsiflexor and plantar flexor. Sensation intact light touch bilateral upper and lower limb Musculoskeletal no pain with shoulder external rotation no pain with impingement testing of the right shoulder. There is 1 fingerbreadth subluxation of the right shoulder mainly inferior. Tone MAS 2 at the elbow flexors on the right MAS 1 at the thumb flexor MAS 0 at the finger flexors with exception of MAS 1 at the right DIP/FDP  Gait uses right AFO she can ambulate without a cane for short distances.  She has some mild hip hiking and mild knee instability   Remains severely aphasic, nonfluent.   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  She struggled naming deltoid as well as scapula.  She could not think of the name of muscles attaching at the base of her skull  Mild tenderness to palpation at the suboccipital muscle on the right side.  This corresponds to the trapezius insertion site    Assessment & Plan:  #1.  History of large left MCA distribution infarct with residual expressive aphasia as well as right hemiparesis.  She does have increased tone in the  right elbow flexors and to a lesser extent thumb IP flexor and index finger DIP flexor at this point it does not appear that she needs another Botox injection. 2.  Chronic right shoulder pain with subluxation.  She in fact has had some improvements with this no evidence of adhesive capsulitis.  No evidence of shoulder impingement 3.  Social patient is moving back to [place].  I have filled out a long-term disability form and I feel that she is disabled on a long-term basis.  She has severe aphasia as well as inability to use right hand.  She is only able to ambulate with an assistive device when she is outside the house.  She needs a AFO on at all times.  She does have a gait deviation and knees instability putting her at risk for falls.  She is unable to carry any objects. Since she plans to move, I recommended that she contact the physical medicine rehabilitation department at [facility] to schedule an appointment with physiatry, further insurance papers will need to be completed there. Over half of the 25 min visit was spent counseling and coordinating care.

## 2019-03-19 NOTE — Patient Instructions (Addendum)
[facility] Department of Physical Medicine and Rehabilitation   If you move back to [facility] please call

## 2019-03-19 NOTE — Telephone Encounter (Signed)
[facility] sent D/C summary for Dr Raoul Pitch to sign, Chronic right shoulder pain. Placed on providers desk to be signed.

## 2019-03-26 ENCOUNTER — Telehealth: Payer: Self-pay | Admitting: *Deleted

## 2019-03-26 NOTE — Telephone Encounter (Signed)
Received my chart message from patient asking about brain MRI that was ordered.  Called [facility] and asked about the 8/31 brain MRI order that was entered and not scheduled. They had record that they had called patient and she didn't want to wait until their next appt in a month so she told [facility] not to schedule at this time.  Cathy at [facility] found an opening this Monday am Oct 5 at [address] for patient to be there at 1020. Will make patient aware by returning her my chart message AND calling her. Called her at 1215 and she verbalized understanding of Monday am appt. My chart message sent also.  Tye [place] said that patient has to be authorized (may be already done as ordered a month ago) but staff message sent to [facility]. Who does our preauthorizations to check.    Copied below from My chart note to patient:   [place] is I got you for brain MRI in THIS Monday Oct 5 at 1020. [facility] [address]  phone is [phone]. Bring insurance card.  I will also call you now to make sure you have this information as it is for Monday AM (this was done).  Also a reminder I see your appt for the testing with physical therapy is 10/12 at 0700 . That is needed for the paperwork you wanted Dr. Tomi Likens to fill out for long term disability. If this form has already been completed by another provider and you no longer need it please let us know. That was the only reason for the physical therapy evaluation was for them to assess you for questions that were asked on the long term disability paperwork.   Thanks

## 2019-03-29 ENCOUNTER — Ambulatory Visit
Admission: RE | Admit: 2019-03-29 | Discharge: 2019-03-29 | Disposition: A | Discharge status: Home / Self Care | Payer: BC Managed Care – PPO | Source: Ambulatory Visit | Attending: Neurology | Admitting: Neurology

## 2019-03-29 ENCOUNTER — Other Ambulatory Visit: Payer: Self-pay

## 2019-03-29 DIAGNOSIS — R519 Headache, unspecified: Secondary | ICD-10-CM

## 2019-03-29 DIAGNOSIS — R200 Anesthesia of skin: Secondary | ICD-10-CM

## 2019-03-30 ENCOUNTER — Telehealth: Payer: Self-pay | Admitting: *Deleted

## 2019-03-30 NOTE — Telephone Encounter (Addendum)
  Called and spoke to patient and relayed below information from MD - no further questions.  ----- Message from [name], DO sent at 03/29/2019  4:05 PM EDT -----     MRI of brain shows her old stroke.  No new strokes.

## 2019-04-01 ENCOUNTER — Encounter: Payer: Self-pay | Admitting: Rehabilitative and Restorative Service Providers"

## 2019-04-05 ENCOUNTER — Ambulatory Visit: Payer: BC Managed Care – PPO

## 2019-04-06 ENCOUNTER — Encounter: Payer: Self-pay | Admitting: Family Medicine

## 2019-04-06 DIAGNOSIS — R4589 Other symptoms and signs involving emotional state: Secondary | ICD-10-CM

## 2019-04-07 ENCOUNTER — Other Ambulatory Visit: Payer: Self-pay | Admitting: Neurology

## 2019-04-07 NOTE — Telephone Encounter (Signed)
Referred to psychology.  Please make patient aware.

## 2019-04-07 NOTE — Telephone Encounter (Signed)
Requested Prescriptions   Pending Prescriptions Disp Refills  . gabapentin (NEURONTIN) 300 MG capsule [Pharmacy Med Name: GABAPENTIN 300 MG CAPSULE] 90 capsule 3    Sig: TAKE 1 CAPSULE BY MOUTH IN THE MORNING AND 2 CAPS IN THE EVENING   Rx last filled: 12/07/18 #90 3 refills  Pt last seen: 02/22/19  Follow up appt scheduled: 07/26/2019

## 2019-04-07 NOTE — Telephone Encounter (Signed)
Patient referral ran out on 04/01/2019. Original order was 03/31/2018. Patient would like another referral. Please advise. Patient was last seen in august of this year.

## 2019-04-09 ENCOUNTER — Ambulatory Visit: Payer: BC Managed Care – PPO | Admitting: Psychology

## 2019-04-12 ENCOUNTER — Other Ambulatory Visit: Payer: Self-pay

## 2019-04-12 DIAGNOSIS — I69891 Dysphagia following other cerebrovascular disease: Secondary | ICD-10-CM

## 2019-04-12 MED ORDER — ESOMEPRAZOLE MAGNESIUM 40 MG PO CPDR: 40.0000 mg | DELAYED_RELEASE_CAPSULE | Freq: Every day | ORAL | 0 refills | Status: DC

## 2019-04-20 ENCOUNTER — Encounter: Payer: Self-pay | Admitting: Family Medicine

## 2019-04-23 ENCOUNTER — Ambulatory Visit (INDEPENDENT_AMBULATORY_CARE_PROVIDER_SITE_OTHER): Payer: BC Managed Care – PPO | Admitting: Family Medicine

## 2019-04-23 ENCOUNTER — Encounter: Payer: Self-pay | Admitting: Family Medicine

## 2019-04-23 ENCOUNTER — Other Ambulatory Visit: Payer: Self-pay

## 2019-04-23 VITALS — Ht 62.0 in | Wt 115.0 lb

## 2019-04-23 DIAGNOSIS — I69398 Other sequelae of cerebral infarction: Secondary | ICD-10-CM | POA: Diagnosis not present

## 2019-04-23 DIAGNOSIS — G8929 Other chronic pain: Secondary | ICD-10-CM | POA: Diagnosis not present

## 2019-04-23 DIAGNOSIS — M792 Neuralgia and neuritis, unspecified: Secondary | ICD-10-CM

## 2019-04-23 DIAGNOSIS — I69351 Hemiplegia and hemiparesis following cerebral infarction affecting right dominant side: Secondary | ICD-10-CM | POA: Diagnosis not present

## 2019-04-23 NOTE — Progress Notes (Signed)
VIRTUAL VISIT VIA VIDEO  I connected with Margaret Ortiz on Ortiz/02/20 at  1:00 PM EDT by a video enabled telemedicine application and verified that I am speaking with the correct person using two identifiers. Location patient: Home Location provider: [facility] Office Persons participating in the virtual visit: Patient, Dr. Raoul Pitch and R.Baker, LPN  I discussed the limitations of evaluation and management by telemedicine and the availability of in person appointments. The patient expressed understanding and agreed to proceed.     Margaret Ortiz, Margaret Ortiz Margaret Ortiz, Margaret Ortiz, 58 y.o., Margaret Ortiz MRN: [id number] Patient Care Team    Relationship Specialty Notifications Start End  Ma Hillock, DO PCP - General Family Medicine  09/16/17   [name], MD Consulting Physician Cardiology  Ortiz/25/19   [name], [place] Physician Neurology  Ortiz/25/19     Chief Complaint  Patient presents with  . Back Pain    Pt would another medication for her pain. Pt would like referral to rehab center for patients who had CVA      Subjective: Pt presents for an OV to discuss her pain medication and referral to physical therapy.  Patient states she is wondering if she needs a different pain medication.  She is concerned her current pain medication is not good for her heart.  She is currently prescribed tramadol 50 mg daily.  She states it does help with her pain.  She reports she does not take it every day and only when needed. She also would like a referral to a stroke rehab, which she states is in the [facility] area.  She had a referral to physical therapy in [facility] on January 22, 2019.  She completed that round of physical therapy.  She states this is not the location she is is liking a referral to now.  Her neurologist placed a referral for functional capacity evaluation on February 24, 2019.  Depression screen [facility] 2/9 09/16/2017  Decreased Interest 0  Down, Depressed, Hopeless 0  PHQ - 2 Score  0    Allergies  Allergen Reactions  . Dust Mite Extract   . Penicillins    Social History   Social History Narrative   Divorced.  From [place], moved to [place] to stay with her family after having a stroke Margaret 2019   Graduate degree, works as a Community education officer.   Former smoker.   Exercise routinely prior to stroke.   Wears a hearing aid.   Smoke alarms in the home.   Wears her seatbelt.      Patient is right-handed. She drinks two cup of coffee a day. She now resides with her sister and brother-in-law.   Past Medical History:  Diagnosis Date  . Aphasia 09/17/2017  . Aphasia as late effect of cerebrovascular accident (CVA) Ortiz/26/2018   s/p stroke, attempts to speak very difficult to understand.  . Cardiac LV ejection fraction Margaret-30%   . Cardioembolic stroke ([place]) [id number]   Right hemiparesis  . Cardiomyopathy ([place]) Ortiz/2018  . Dysphagia Ortiz/26/2018   Last known diet upgraded to regular diet with thin liquids on 06/27/2017, no records available  . 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 Fall 09/17/2017  . Graves disease   . H/O ischemic left MCA stroke 09/17/2017  . Hemiparesis of right dominant side as late effect of cerebral infarction ([place]) 09/17/2017  . Hyperlipidemia   . Hypertension   . Neuropathy Ortiz/26/2018   s/p stroke  .  Plaque psoriasis   . Shoulder subluxation, right, initial encounter 09/17/2017  . Weakness 09/17/2017   Past Surgical History:  Procedure Laterality Date  . NO PAST SURGERIES     Family History  Problem Relation Age of Onset  . Hyperlipidemia Mother   . Hypertension Mother   . Early death Father   . Hypertension Sister   . Heart attack Brother   . Mental illness Brother    Allergies as of 04/23/2019      Reactions   Dust Mite Extract    Penicillins       Medication List       Accurate as of April 23, 2019 12:58 PM. If you have any questions, ask your nurse or doctor.        apixaban 5 MG Tabs tablet  Commonly known as: Eliquis Take 1 tablet (5 mg total) by mouth 2 (two) times daily.   atorvastatin 80 MG tablet Commonly known as: LIPITOR Take 1 tablet (80 mg total) by mouth daily.   carvedilol 3.125 MG tablet Commonly known as: COREG Take 1 tablet (3.125 mg total) by mouth 2 (two) times daily.   clobetasol cream 0.05 % Commonly known as: TEMOVATE Apply 1 application topically 2 (two) times daily.   esomeprazole 40 MG capsule Commonly known as: NEXIUM Take 1 capsule (40 mg total) by mouth daily at 12 noon.   gabapentin 300 MG capsule Commonly known as: NEURONTIN Take 2 capsules in the am and 2 capsules in the pm   OVER THE COUNTER MEDICATION CBD Oil   traMADol 50 MG tablet Commonly known as: ULTRAM Take 1 tablet (50 mg total) by mouth every 12 (twelve) hours as needed.       All past medical history, surgical history, allergies, family history, immunizations andmedications were updated in the EMR today and reviewed under the history and medication portions of their EMR.     ROS: Negative, with the exception of above mentioned in HPI   Objective:  Ht 5\' 2"  (1.575 m)   Wt 115 lb (52.2 kg)   BMI Margaret.03 kg/m  Body mass index is Margaret.03 kg/m. Gen: Afebrile. No acute distress. Nontoxic in appearance, well developed, well nourished.  HENT: AT. [place]. Neuro:  Alert. Oriented x3.  Psych: Normal affect, dress and demeanor. Normal speech. Normal thought content and judgment.  No exam data present No results found. No results found for this or any previous visit (from the past 24 hour(s)).  Assessment/Plan: Margaret Ortiz is a 58 y.o. 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  It is working well for her.  She is not overtaking the medication.   Hemiparesis of right dominant side as late effect  of cerebral infarction [facility]) Patient would like referral to physical therapy.  It sounds like she has a particular stroke rehabilitation site in mind.  Although, I am uncertain of the location she is speaking of.  She will E chart [place] the name and address of the location, and if we are able to get her referred to that location we will be happy to do so.   Reviewed expectations re: course of current medical issues.  Discussed self-management of symptoms.  Outlined signs and symptoms indicating need for more acute intervention.  Patient verbalized understanding and all questions were answered.  Patient received an  After-Visit Summary.    No orders of the defined types were placed in this encounter.   > 15 minutes spent with patient, > 50% of that time face to face  Note is dictated utilizing voice recognition software. Although note has been proof read prior to signing, occasional typographical errors still can be missed. If any questions arise, please do not hesitate to call for verification.   electronically signed by:  Howard Pouch, DO  [place]

## 2019-04-26 ENCOUNTER — Encounter: Payer: Self-pay | Admitting: Rehabilitative and Restorative Service Providers"

## 2019-04-26 ENCOUNTER — Encounter: Payer: Self-pay | Admitting: Family Medicine

## 2019-04-26 NOTE — Patient Instructions (Signed)
Please message just the location you would like your physical therapy referral placed and we will be happy to place that for you.

## 2019-05-04 ENCOUNTER — Encounter: Payer: Self-pay | Admitting: Family Medicine

## 2019-05-04 DIAGNOSIS — I69398 Other sequelae of cerebral infarction: Secondary | ICD-10-CM

## 2019-05-04 DIAGNOSIS — G8111 Spastic hemiplegia affecting right dominant side: Secondary | ICD-10-CM

## 2019-05-04 DIAGNOSIS — Z8673 Personal history of transient ischemic attack (TIA), and cerebral infarction without residual deficits: Secondary | ICD-10-CM

## 2019-05-04 DIAGNOSIS — M2550 Pain in unspecified joint: Secondary | ICD-10-CM

## 2019-05-04 DIAGNOSIS — I69351 Hemiplegia and hemiparesis following cerebral infarction affecting right dominant side: Secondary | ICD-10-CM

## 2019-05-04 NOTE — Telephone Encounter (Signed)
Pt asking for referral to [facility] [address] [place],  [place]  [address]  Tel  [phone]

## 2019-05-05 NOTE — Telephone Encounter (Signed)
Order placed

## 2019-05-07 ENCOUNTER — Encounter: Payer: Self-pay | Admitting: Family Medicine

## 2019-05-18 ENCOUNTER — Encounter: Payer: Self-pay | Admitting: Family Medicine

## 2019-05-25 ENCOUNTER — Ambulatory Visit: Payer: BC Managed Care – PPO | Admitting: Family Medicine

## 2019-05-27 ENCOUNTER — Ambulatory Visit (INDEPENDENT_AMBULATORY_CARE_PROVIDER_SITE_OTHER): Payer: BC Managed Care – PPO | Admitting: Family Medicine

## 2019-05-27 ENCOUNTER — Other Ambulatory Visit: Payer: Self-pay

## 2019-05-27 ENCOUNTER — Encounter: Payer: Self-pay | Admitting: Family Medicine

## 2019-05-27 VITALS — BP 102/71 | HR 82 | Temp 98.4°F | Resp 16 | Ht 62.0 in | Wt 119.5 lb

## 2019-05-27 DIAGNOSIS — G8929 Other chronic pain: Secondary | ICD-10-CM

## 2019-05-27 DIAGNOSIS — I69398 Other sequelae of cerebral infarction: Secondary | ICD-10-CM | POA: Diagnosis not present

## 2019-05-27 DIAGNOSIS — R350 Frequency of micturition: Secondary | ICD-10-CM

## 2019-05-27 DIAGNOSIS — I69351 Hemiplegia and hemiparesis following cerebral infarction affecting right dominant side: Secondary | ICD-10-CM | POA: Diagnosis not present

## 2019-05-27 DIAGNOSIS — R319 Hematuria, unspecified: Secondary | ICD-10-CM

## 2019-05-27 DIAGNOSIS — G8111 Spastic hemiplegia affecting right dominant side: Secondary | ICD-10-CM

## 2019-05-27 DIAGNOSIS — M792 Neuralgia and neuritis, unspecified: Secondary | ICD-10-CM

## 2019-05-27 DIAGNOSIS — G629 Polyneuropathy, unspecified: Secondary | ICD-10-CM

## 2019-05-27 LAB — POC URINALSYSI DIPSTICK (AUTOMATED)
Bilirubin, UA: NEGATIVE
Glucose, UA: NEGATIVE
Ketones, UA: NEGATIVE
Leukocytes, UA: NEGATIVE
Nitrite, UA: NEGATIVE
Protein, UA: NEGATIVE
Spec Grav, UA: >=1.03 — AB (ref 1.010–1.025)
Urobilinogen, UA: 0.2 E.U./dL
pH, UA: 5.5 (ref 5.0–8.0)

## 2019-05-27 MED ORDER — NITROFURANTOIN MONOHYD MACRO 100 MG PO CAPS: 100.0000 mg | ORAL_CAPSULE | Freq: Two times a day (BID) | ORAL | 0 refills | Status: DC

## 2019-05-27 MED ORDER — TRAMADOL HCL 50 MG PO TABS: 50.0000 mg | ORAL_TABLET | Freq: Two times a day (BID) | ORAL | 5 refills | Status: DC | PRN

## 2019-05-27 NOTE — Patient Instructions (Signed)
I refilled your tramadol for you today. It is ok to take the tramadol every 12 hours for pain. Please do not let the pain build. Take tramadol at least once a day and every 12 hours if needed.   You can cut back to gabapentin 1 tab in morning and 2 tabs in evening if you feel it is making you dizzy in the morning.   If your shoulder  pain is worsening despite using the tramadol as prescribed and physical therapy>> then the next step would be to refer you back to orthopedics.

## 2019-05-27 NOTE — Progress Notes (Signed)
Margaret Ortiz, Margaret Ortiz 04-13-1961, 58 y.o., female MRN: [id number] Patient Care Team    Relationship Specialty Notifications Start End  Margaret Hillock, DO PCP - General Family Medicine  09/16/17   Margaret Booze, MD Consulting Physician Cardiology  05/18/18   Margaret Ortiz, [place] Physician Neurology  05/18/18     Chief Complaint  Patient presents with  . Shoulder Pain    Pt is having right shoulder pain. She starts Physical therapy tomorrow. Pt would like permanent disability forms completed      Subjective: Pt presents for an OV to discuss her chronic right shoulder pain s/p stroke with hemiparesis of right dominant side and neurogenic pain secondary to stroke.  She is prescribed tramadol 50 mg twice daily as needed.  She states she does not take that medication except for maybe once every other day.  She is also prescribed gabapentin 600-600, but reports if she takes the 2 tabs in the morning she feels she becomes dizzy.  She had been prescribed muscle relaxers/baclofen in the past, but stopped using the medication.  Patient starts/restarts her physical therapy tomorrow.  She had a referral to physical therapy in [facility] on January 22, 2019.  She completed that round of physical therapy.  Her neurologist placed a referral for functional capacity evaluation on February 24, 2019.  Patient also complains of urinary frequency that started late last week.  She denies any fever, chills, nausea or vomit.  She has had the urge to empty her bladder she reports approximately every 30 minutes.  She denies dysuria.  Depression screen [facility] 2/9 09/16/2017  Decreased Interest 0  Down, Depressed, Hopeless 0  PHQ - 2 Score 0    Allergies  Allergen Reactions  . Dust Mite Extract   . Penicillins    Social History   Social History Narrative   Divorced.  From [place], moved to [place] to stay with her family after having a stroke March 2019   Graduate degree, works as a Community education officer.   Former smoker.   Exercise routinely prior to stroke.   Wears a hearing aid.   Smoke alarms in the home.   Wears her seatbelt.      Patient is right-handed. She drinks two cup of coffee a day. She now resides with her sister and brother-in-law.   Past Medical History:  Diagnosis Date  . Aphasia 09/17/2017  . 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  . Cardiac LV ejection fraction 21-30%   . Cardioembolic stroke ([place]) [id number]   Right hemiparesis  . Cardiomyopathy ([place]) 04/2017  . Dysphagia 05/19/2017   Last known diet upgraded to regular diet with thin liquids on 06/27/2017, no records available  . 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 Fall 09/17/2017  . Graves disease   . H/O ischemic left MCA stroke 09/17/2017  . Hemiparesis of right dominant side as late effect of cerebral infarction ([place]) 09/17/2017  . Hyperlipidemia   . Hypertension   . Neuropathy 05/19/2017   s/p stroke  . Plaque psoriasis   . Shoulder subluxation, right, initial encounter 09/17/2017  . Weakness 09/17/2017   Past Surgical History:  Procedure Laterality Date  . NO PAST SURGERIES     Family History  Problem Relation Age of Onset  . Hyperlipidemia Mother   . Hypertension Mother   . Early death Father   . Hypertension  Sister   . Heart attack Brother   . Mental illness Brother    Allergies as of 05/27/2019      Reactions   Dust Mite Extract    Penicillins       Medication List       Accurate as of May 27, 2019 11:48 AM. If you have any questions, ask your nurse or doctor.        apixaban 5 MG Tabs tablet Commonly known as: Eliquis Take 1 tablet (5 mg total) by mouth 2 (two) times daily.   atorvastatin 80 MG tablet Commonly known as: LIPITOR Take 1 tablet (80 mg total) by mouth daily.   carvedilol 3.125 MG tablet Commonly known as: COREG Take 1 tablet (3.125 mg total) by mouth 2  (two) times daily.   clobetasol cream 0.05 % Commonly known as: TEMOVATE Apply 1 application topically 2 (two) times daily.   esomeprazole 40 MG capsule Commonly known as: NEXIUM Take 1 capsule (40 mg total) by mouth daily at 12 noon.   gabapentin 300 MG capsule Commonly known as: NEURONTIN Take 2 capsules in the am and 2 capsules in the pm   nitrofurantoin (macrocrystal-monohydrate) 100 MG capsule Commonly known as: Macrobid Take 1 capsule (100 mg total) by mouth 2 (two) times daily. Started by: Howard Pouch, DO   OVER THE COUNTER MEDICATION CBD Oil   traMADol 50 MG tablet Commonly known as: ULTRAM Take 1 tablet (50 mg total) by mouth every 12 (twelve) hours as needed.       All past medical history, surgical history, allergies, family history, immunizations andmedications were updated in the EMR today and reviewed under the history and medication portions of their EMR.     ROS: Negative, with the exception of above mentioned in HPI   Objective:  BP 102/71 (BP Location: Left Arm, Patient Position: Sitting, Cuff Size: Normal)   Pulse 82   Temp 98.4 F (36.9 C) (Temporal)   Resp 16   Ht 5\' 2"  (1.575 m)   Wt 119 lb 8 oz (54.2 kg)   SpO2 100%   BMI 21.86 kg/m  Body mass index is 21.86 kg/m. Gen: Afebrile. No acute distress.  HENT: AT. [place].  Eyes:Pupils Equal Round Reactive to light, Extraocular movements intact,  Conjunctiva without redness, discharge or icterus.  Neuro: Unchanged gait.  Walking independently with a cane. PERLA. EOMi. Alert. Oriented.  Mild dysphagia present. Psych: Normal affect, dress and demeanor. Normal speech. Normal thought content and judgment..   No exam data present No results found. Results for orders placed or performed in visit on 05/27/19 (from the past 24 hour(s))  POCT Urinalysis Dipstick (Automated)     Status: Abnormal   Collection Time: 05/27/19 10:26 AM  Result Value Ref Range   Color, UA yellow    Clarity, UA clear     Glucose, UA Negative Negative   Bilirubin, UA negative    Ketones, UA negative    Spec Grav, UA >=1.030 (A) 1.010 - 1.025   Blood, UA 2+    pH, UA 5.5 5.0 - 8.0   Protein, UA Negative Negative   Urobilinogen, UA 0.2 0.2 or 1.0 E.U./dL   Nitrite, UA negative    Leukocytes, UA Negative Negative    Assessment/Plan: Margaret Ortiz is a 58 y.o. 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  She is still not taking routinely as prescribed, and complains of pain. It works well for her when she takes it.  Continue gabapentin- can take 1 tab in morning if desired and 2 tabs evening. She reports 2 tabs in the morning was causing SE.  Start PT tomorrow.  Refilled tramadol- take at least once day, may take every 12 hours.  -[place] controlled substance database reviewed and appropriate today. -NSAIDs contraindicated secondary to chronic anticoagulation -Tylenol contraindicated secondary to elevated liver enzymes with the use. Discussed with her today if pain is not controlled with regimen>> then would refer her back to orthopedic to see if they have anything further to offer her.  Follow-up every 5.5 months if desiring continued pain medication refills.  Urinary frequency/hematuria -Patient's urine today with 2+ RBCs.  Will send for urine culture and elected to start treatment with Macrobid twice daily for 5 days.  Patient will be called with urine culture results. - POCT Urinalysis Dipstick (Automated)  Reviewed expectations re: course of current medical issues.  Discussed self-management of symptoms.  Outlined  signs and symptoms indicating need for more acute intervention.  Patient verbalized understanding and all questions were answered.  Patient received an After-Visit Summary.    Orders Placed This Encounter  Procedures  . Urine Culture  . POCT Urinalysis Dipstick (Automated)   This visit occurred during the SARS-CoV-2 public health emergency.  Safety protocols were in place, including screening questions prior to the visit, additional usage of staff PPE, and extensive cleaning of exam room while observing appropriate contact time as indicated for disinfecting solutions.     Note is dictated utilizing voice recognition software. Although note has been proof read prior to signing, occasional typographical errors still can be missed. If any questions arise, please do not hesitate to call for verification.   electronically signed by:  Howard Pouch, DO  [place]

## 2019-05-28 ENCOUNTER — Encounter: Payer: Self-pay | Admitting: Physical Therapy

## 2019-05-28 ENCOUNTER — Ambulatory Visit: Payer: BC Managed Care – PPO | Attending: Family Medicine | Admitting: Physical Therapy

## 2019-05-28 ENCOUNTER — Other Ambulatory Visit: Payer: Self-pay

## 2019-05-28 VITALS — BP 113/68 | HR 59

## 2019-05-28 DIAGNOSIS — R29818 Other symptoms and signs involving the nervous system: Secondary | ICD-10-CM | POA: Diagnosis present

## 2019-05-28 DIAGNOSIS — R2681 Unsteadiness on feet: Secondary | ICD-10-CM | POA: Diagnosis present

## 2019-05-28 DIAGNOSIS — M6281 Muscle weakness (generalized): Secondary | ICD-10-CM

## 2019-05-28 DIAGNOSIS — R278 Other lack of coordination: Secondary | ICD-10-CM | POA: Insufficient documentation

## 2019-05-28 DIAGNOSIS — I69351 Hemiplegia and hemiparesis following cerebral infarction affecting right dominant side: Secondary | ICD-10-CM | POA: Diagnosis present

## 2019-05-28 DIAGNOSIS — R2689 Other abnormalities of gait and mobility: Secondary | ICD-10-CM

## 2019-05-28 LAB — URINE CULTURE
MICRO NUMBER:: 1162717
Result:: NO GROWTH
SPECIMEN QUALITY:: ADEQUATE

## 2019-05-30 NOTE — Therapy (Signed)
[facility] [address] [place], [place], [address] Phone: [phone]   Fax:  [phone]  Physical Therapy Evaluation  Patient Details  Name: Margaret Ortiz MRN: [id number] Date of Birth: [birth year] Referring Provider (PT): Howard Pouch   Encounter Date: 05/28/2019  PT End of Session - 05/30/19 1833    Visit Number  1    Number of Visits  13    Date for PT Re-Evaluation  08/08/19    Authorization Type  BCBCS    PT Start Time  1530    PT Stop Time  1615    PT Time Calculation (min)  45 min    Equipment Utilized During Treatment  Gait belt    Activity Tolerance  Patient tolerated treatment well    Behavior During Therapy  [facility] for tasks assessed/performed       Past Medical History:  Diagnosis Date  . Aphasia 09/17/2017  . 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  . Cardiac LV ejection fraction 21-30%   . Cardioembolic stroke ([place]) [id number]   Right hemiparesis  . Cardiomyopathy ([place]) 04/2017  . Dysphagia 05/19/2017   Last known diet upgraded to regular diet with thin liquids on 06/27/2017, no records available  . 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 Fall 09/17/2017  . Graves disease   . H/O ischemic left MCA stroke 09/17/2017  . Hemiparesis of right dominant side as late effect of cerebral infarction ([place]) 09/17/2017  . Hyperlipidemia   . Hypertension   . Neuropathy 05/19/2017   s/p stroke  . Plaque psoriasis   . Shoulder subluxation, right, initial encounter 09/17/2017  . Weakness 09/17/2017    Past Surgical History:  Procedure Laterality Date  . NO PAST SURGERIES      Vitals:   05/28/19 1552  BP: 113/68  Pulse: (!) 59         05/28/19 1535  Symptoms/Limitations  Subjective The patient is known to our clinic from prior CVA 05/19/2017 with extensive therapy ending 04/23/2018. Reports  that she has R shoulder and hand pain. Received a Walk Aide about a month ago - reports that she is currently using it in the house. Has been wearing her brace at all times outside the house. Pt reports no falls. Reports she might be moving back to [place] in 4-6 months. Wearing shoe covers on shoes due to fear of Covid.  Pertinent History PMH: graves disease, HLD, HTN  How long can you walk comfortably? walks everyday - 30 minutes in the morning and 30 minutes in the afternoon  Patient Stated Goals wants to work on walking and resistance work, wants stability exercises. wants to know how to get up off the floor - reports she really needs to work on flexion and ABD  Pain Assessment  Currently in Pain? Yes  Pain Score 5  Pain Location Arm  Pain Orientation Right        05/28/19 1543  Assessment  Medical Diagnosis L MCA stroke  Referring Provider (PT) Raoul Pitch, Renee  Onset Date/Surgical Date 05/19/17  Hand Dominance Right  Prior Therapy extensive therapy at this location   Precautions  Precautions Fall  Balance Screen  Has the patient fallen in the past 6 months No  Has the patient had a decrease in activity level because of a fear of falling?  No  Is the patient reluctant to leave their home  because of a fear of falling?  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 throughout clinic)  Assistive device None  Gait Pattern Step-through pattern;Decreased stance time - right;Decreased step length - left;Decreased hip/knee flexion - right;Decreased dorsiflexion - right;Decreased weight shift to right;Poor foot clearance - right  Ambulation Surface Level;Indoor  Gait velocity 21.5 seconds = 1.53 ft/sec SPC (24.72 seconds = 1.33 ft/sec no AD)  Stairs Yes  Stairs Assistance 5: Supervision  Stair Management Technique One rail Left;Alternating pattern;Step to pattern  Number of Stairs 8  Height of Stairs 6  Standardized Balance Assessment  Standardized Balance Assessment Berg Balance Test;TUG  Berg Balance Test  Sit to Stand 4  Standing Unsupported 4  Sitting with Back Unsupported but Feet Supported on Floor or Stool 4  Stand to Sit 4  Transfers 4  Standing Unsupported with Eyes Closed 4  Standing Unsupported with Feet Together 4  From Standing, Reach Forward with Outstretched Arm 3  From Standing Position, Pick up Object from Floor 4  From Standing Position, Turn to Look Behind Over each Shoulder 2  Turn 360 Degrees 2 (7.87 to  L, 9 to R)  Standing Unsupported, Alternately Place Feet on Step/Stool 0  Standing Unsupported, One Foot in Front 3  Standing on One Leg 0  Total Score 42  Berg comment: 42/56  Timed Up and Go Test  Normal TUG (seconds) 17.88 (with SPC, 20.47 no AD)             Objective measurements completed on examination: See above findings.              PT Education - 05/30/19 1833    Education Details  POC, clinical findings.    Person(s) Educated  Patient    Methods  Explanation    Comprehension  Verbalized understanding         PT Short Term Goals - 05/30/19 1835      PT  SHORT TERM GOAL #1   Title  The patient will be able to perform HEP mod indep for LE strength (ankle isolated control, hamstring, hip extensors). ALL LTGS DUE 07/18/19    Time  7   7 weeks due to delay in scheduling.   Period  Weeks    Status  New    Target Date  07/18/19      PT SHORT TERM GOAL #2   Title  Patient will improve BERG balance score to at leas a 44/56 in order to demo improved balance.    Baseline  42/56    Time  7    Period  Weeks    Status  New      PT SHORT TERM  GOAL #3   Title  Patient will ambulate at least 500' outdoors with mod I in order to demo improved endurance for functional gait.    Time  7    Period  Weeks    Status  New      PT SHORT TERM GOAL #4   Title  Patient will decrease TUG to 16 seconds or less with SPC in order to demo decr fall risk.    Baseline  17.88 seconds         PT Long Term Goals - 05/30/19 1838      PT LONG TERM GOAL #1   Title  The patient will negotiate a curb mod indep.    Time  10   10 weeks due to delay in scheduling   Period  Weeks      PT LONG TERM GOAL #2   Title  The patient will move floor<>stand with mod indep using L UE support on surface.    Baseline  not yet assessed.    Time  10    Period  Weeks    Status  New      PT LONG TERM GOAL #3   Title  The patient will improve gait speed without device from 1.33 ft/sec to > or equal to 1.8 ft/sec to demo improving balance/stability without a device.    Baseline  1.33 ft/sec no AD    Status  New      PT LONG TERM GOAL #4   Title  Patient will improve BERG balance score to a 47/56 in order to demo decr fall risk.    Baseline  42/56    Time  10    Period  Weeks    Status  New      PT LONG TERM GOAL #5   Title  Patient will be independent with final HEP in order to build upon functional gains made in therapy.    Time  10    Period  Weeks    Status  New      Additional Long Term Goals   Additional Long Term Goals  Yes      PT LONG TERM GOAL #6   Title   6MWT goal as appropriate to measure improved endurance for gait.    Baseline  not yet assessed.    Time  10    Period  Weeks    Status  New          05/30/19 1834  Plan  Clinical Impression Statement The patient is a 58 yo female well known to this clinic s/p CVA 05/19/2017.  She has had extensive OP rehab and returns today due to R shoulder/arm pain, wanting to work on walking, RLE strengthening, and to learn how to get up safely off the floor. The following deficits were present during the exam: gait abnormalities, decreased strength, impaired balance, decreased ROM. Pt's TUG and BERG scores indicate pt is at a high risk for falls.  Pt would benefit from skilled PT to address these impairments and functional limitations to maximize functional mobility independence  Personal Factors and Comorbidities Comorbidity 3+;Past/Current Experience;Time since onset of injury/illness/exacerbation  Comorbidities graves disease, HLD, HTN  Examination-Activity Limitations Stairs;Transfers;Locomotion Level  Examination-Participation Restrictions Community Activity  Pt will benefit from skilled therapeutic intervention in order to improve on the following deficits Abnormal gait;Decreased balance;Decreased activity tolerance;Decreased coordination;Decreased endurance;Decreased range of motion;Difficulty walking;Decreased strength;Impaired tone;Pain  Stability/Clinical Decision Making Stable/Uncomplicated  Clinical Decision Making Low  Rehab Potential Good  PT Frequency 2x / week  PT Duration 6 weeks  PT Treatment/Interventions ADLs/Self Care Home Management;Aquatic Therapy;Electrical Stimulation;Therapeutic exercise;Therapeutic activities;Functional mobility training;Stair training;Gait training;DME Instruction;Balance training;Neuromuscular re-education;Patient/family education;Orthotic Fit/Training;Energy conservation;Passive range of motion  PT Next Visit Plan did pt bring in Walk Aide again? initial HEP  for balance/LE strengthening, is aquatic PT set up? assess curb, floor > mat transfers. 6MWT for endurance.  Recommended Other Services OT referral  Consulted and Agree with Plan of Care Patient   Patient will benefit from skilled therapeutic intervention in order to improve the following deficits and impairments:     Visit Diagnosis: Other lack of coordination  Other symptoms and signs involving the nervous system  Unsteadiness on feet  Muscle weakness (generalized)  Hemiplegia and hemiparesis following cerebral infarction affecting right dominant side (HCC)  Other abnormalities of gait and mobility     Problem List Patient Active Problem List   Diagnosis Date Noted  . Encounter for chronic pain management 01/22/2019  . Right spastic hemiplegia ([place]) 11/27/2018  . Elevated LFTs 11/20/2018  . 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 Unintentional weight loss 11/05/2018  . Fatigue 11/05/2018  . Bloody stool 11/05/2018  . Polyarthralgia 11/05/2018  . Chronic anticoagulation 11/05/2018  . Difficulty coping with disease 03/31/2018  . Hemiparesis of right dominant side as late effect of cerebral infarction ([place]) 09/17/2017  . Cardioembolic stroke ([place]) [id number]  . 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 Cardiomyopathy ([place]) 09/17/2017  . H/O ischemic left MCA stroke 09/17/2017  . Cardiac LV ejection fraction 21-30%   . Graves disease   . Plaque psoriasis   . Neuropathy 05/19/2017    Arliss Journey, PT, DPT  05/30/2019, 6:41 PM  [facility] [address] [place], [place], [address] Phone: [phone]   Fax:  [phone]  Name: Margaret Ortiz MRN: [id number] Date of Birth: August 28, 1960

## 2019-05-31 ENCOUNTER — Encounter: Payer: Self-pay | Admitting: Family Medicine

## 2019-05-31 ENCOUNTER — Other Ambulatory Visit: Payer: Self-pay | Admitting: Interventional Cardiology

## 2019-05-31 ENCOUNTER — Telehealth: Payer: Self-pay | Admitting: Family Medicine

## 2019-05-31 ENCOUNTER — Other Ambulatory Visit: Payer: Self-pay | Admitting: Pharmacist

## 2019-05-31 ENCOUNTER — Telehealth: Payer: Self-pay | Admitting: Physical Therapy

## 2019-05-31 ENCOUNTER — Other Ambulatory Visit: Payer: Self-pay

## 2019-05-31 DIAGNOSIS — I69351 Hemiplegia and hemiparesis following cerebral infarction affecting right dominant side: Secondary | ICD-10-CM

## 2019-05-31 DIAGNOSIS — R319 Hematuria, unspecified: Secondary | ICD-10-CM | POA: Insufficient documentation

## 2019-05-31 DIAGNOSIS — I639 Cerebral infarction, unspecified: Secondary | ICD-10-CM

## 2019-05-31 DIAGNOSIS — G8929 Other chronic pain: Secondary | ICD-10-CM

## 2019-05-31 DIAGNOSIS — I69891 Dysphagia following other cerebrovascular disease: Secondary | ICD-10-CM

## 2019-05-31 DIAGNOSIS — M25511 Pain in right shoulder: Secondary | ICD-10-CM

## 2019-05-31 DIAGNOSIS — I63412 Cerebral infarction due to embolism of left middle cerebral artery: Secondary | ICD-10-CM

## 2019-05-31 MED ORDER — GABAPENTIN 300 MG PO CAPS: ORAL_CAPSULE | ORAL | 1 refills | Status: DC

## 2019-05-31 MED ORDER — ESOMEPRAZOLE MAGNESIUM 40 MG PO CPDR: 40.0000 mg | DELAYED_RELEASE_CAPSULE | Freq: Every day | ORAL | 1 refills | Status: DC

## 2019-05-31 MED ORDER — APIXABAN 5 MG PO TABS: 5.0000 mg | ORAL_TABLET | Freq: Two times a day (BID) | ORAL | 1 refills | Status: DC

## 2019-05-31 MED ORDER — ATORVASTATIN CALCIUM 80 MG PO TABS: 80.0000 mg | ORAL_TABLET | Freq: Every day | ORAL | 2 refills | Status: DC

## 2019-05-31 MED ORDER — CLOBETASOL PROPIONATE 0.05 % EX CREA: 1.0000 "application " | TOPICAL_CREAM | Freq: Two times a day (BID) | CUTANEOUS | 5 refills | Status: AC

## 2019-05-31 MED ORDER — CARVEDILOL 3.125 MG PO TABS: 3.1250 mg | ORAL_TABLET | Freq: Two times a day (BID) | ORAL | 2 refills | Status: DC

## 2019-05-31 NOTE — Progress Notes (Signed)
Age 58, weight 54kg, SCr 1 on 03/03/19, last OV Sept 2020, seems to be taking Eliquis for cadioembolic stroke secondary to reduced EF. This is not an FDA approved indication. PCP had been filling this, we somehow started sending in refills earlier this year.   Per Dr Karle Barr initial note with pt on 11/04/17:  "Prior CVA: She is now taking Eliquis for anticoagulation due to suspicion that this was from a cardioembolic source.  She did not have any mention of an LV thrombus on echo.  She is certainly at higher risk of atrial fibrillation given her structural heart disease.  She is not having any bleeding problems.  Continue anticoagulation for now."  Will send in refill.

## 2019-05-31 NOTE — Telephone Encounter (Signed)
Referral placed.     [facility]: Please make sure it gets faxed to [facility]... Since that was not a selection available for routing referral.

## 2019-05-31 NOTE — Telephone Encounter (Signed)
Copy made to be scanned into chart. Faxed and sent original to patient. Pt notified

## 2019-05-31 NOTE — Telephone Encounter (Signed)
Requested Prescriptions   Pending Prescriptions Disp Refills  . gabapentin (NEURONTIN) 300 MG capsule 340 capsule 1    Sig: Take 2 capsules in the am and 2 capsules in the pm   Rx last filled: 04/07/19 #340 1 refills  Pt last seen:02/22/19  Follow up appt scheduled:07/26/19

## 2019-05-31 NOTE — Addendum Note (Signed)
Addended by: Derl Barrow on: 05/31/2019 12:48 PM   Modules accepted: Orders

## 2019-05-31 NOTE — Telephone Encounter (Signed)
Discharge application: Total and permanent disability forms for federal student loans completed.   Please place office stamp/address Please make a copy to scanned into system. Please fax to number provided. Please call patient and let her know they have been faxed and mailed original form back to her.

## 2019-05-31 NOTE — Telephone Encounter (Signed)
Refill requests from [facility]:  RF request for Nexium 40mg  LOV: 05/27/2019 Next ov: Not scheduled  Last written: 04/12/2019 #90 w/ 0 refills  RF request for Clobetasol Cream  LOV: 05/27/2019 Next ov: Not scheduled  Last written: 11/13/2018 30g w/ 5 refills   Please advise

## 2019-05-31 NOTE — Telephone Encounter (Signed)
Dr. Raoul Pitch,   Winnell Binning "Margaret Ortiz" has been seen by Physical Therapy at [facility] for an evaluation on 05/31/19. The patient would benefit from an OT evaluation due to reports of increased right arm and shoulder pain. Patient has been seen at this clinic previously for OT. If you agree, please place an order in [place] in Epic or fax the order to [phone].   Thank you, Janann August, PT, DPT

## 2019-06-01 NOTE — Telephone Encounter (Signed)
Message from former patient

## 2019-06-02 ENCOUNTER — Encounter: Payer: Self-pay | Admitting: Family Medicine

## 2019-06-16 ENCOUNTER — Encounter: Payer: Self-pay | Admitting: Family Medicine

## 2019-06-28 ENCOUNTER — Other Ambulatory Visit: Payer: Self-pay

## 2019-06-28 ENCOUNTER — Ambulatory Visit: Payer: BLUE CROSS/BLUE SHIELD | Attending: Family Medicine | Admitting: Physical Therapy

## 2019-06-28 ENCOUNTER — Encounter: Payer: Self-pay | Admitting: Physical Therapy

## 2019-06-28 VITALS — BP 111/68 | HR 69

## 2019-06-28 DIAGNOSIS — M25511 Pain in right shoulder: Secondary | ICD-10-CM | POA: Insufficient documentation

## 2019-06-28 DIAGNOSIS — R2689 Other abnormalities of gait and mobility: Secondary | ICD-10-CM | POA: Insufficient documentation

## 2019-06-28 DIAGNOSIS — I69351 Hemiplegia and hemiparesis following cerebral infarction affecting right dominant side: Secondary | ICD-10-CM | POA: Diagnosis present

## 2019-06-28 DIAGNOSIS — M6281 Muscle weakness (generalized): Secondary | ICD-10-CM | POA: Insufficient documentation

## 2019-06-28 DIAGNOSIS — I69318 Other symptoms and signs involving cognitive functions following cerebral infarction: Secondary | ICD-10-CM | POA: Diagnosis present

## 2019-06-28 DIAGNOSIS — R2681 Unsteadiness on feet: Secondary | ICD-10-CM | POA: Diagnosis present

## 2019-06-28 DIAGNOSIS — G8929 Other chronic pain: Secondary | ICD-10-CM | POA: Diagnosis present

## 2019-06-28 DIAGNOSIS — R29818 Other symptoms and signs involving the nervous system: Secondary | ICD-10-CM | POA: Diagnosis present

## 2019-06-28 DIAGNOSIS — R278 Other lack of coordination: Secondary | ICD-10-CM | POA: Diagnosis present

## 2019-06-28 NOTE — Patient Instructions (Signed)
Access Code: [email or web address]  URL: [email or web address]  Date: 06/28/2019  Prepared by: Janann August   Exercises Heel rises with counter support - 10 reps - 1 sets - 2x daily - 7x weekly Standing Gastroc Stretch - 3 reps - 1 sets - 30 seconds hold - 2x daily - 7x weekly Wall Squat - 10 reps - 1 sets - 2x daily - 7x weekly Bridge with Hip Abduction and Resistance - 10 reps - 1 sets - 2x daily - 7x weekly Seated Heel Slide - 10 reps - 2 sets - 1x daily - 7x weekly Single Leg Stance - 3 reps - 1 sets - 10 seconds hold - 1x daily - 7x weekly Supine March - 10 reps - 2 sets - 1x daily - 7x weekly Sit to Stand without Arm Support - 10 reps - 2 sets - 1x daily - 7x weekly

## 2019-06-28 NOTE — Therapy (Signed)
[facility] [address] [place], [place], [address] Phone: [phone]   Fax:  [phone]  Physical Therapy Treatment  Patient Details  Name: Margaret Ortiz MRN: [id number] Date of Birth: [birth year] Referring Provider (PT): Howard Pouch   Encounter Date: 06/28/2019     06/28/19 1159  PT Visits / Re-Eval  Visit Number 2  Number of Visits 13  Date for PT Re-Evaluation 08/08/19 (due to delay in scheduling)  Authorization  Authorization Type BCBCS  PT Time Calculation  PT Start Time 1103  PT Stop Time 1142  PT Time Calculation (min) 39 min  PT - End of Session  Equipment Utilized During Treatment Gait belt  Activity Tolerance Patient tolerated treatment well  Behavior During Therapy [facility] for tasks assessed/performed    Past Medical History:  Diagnosis Date  . Aphasia 09/17/2017  . 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  . Cardiac LV ejection fraction 21-30%   . Cardioembolic stroke ([place]) [id number]   Right hemiparesis  . Cardiomyopathy ([place]) 04/2017  . Dysphagia 05/19/2017   Last known diet upgraded to regular diet with thin liquids on 06/27/2017, no records available  . 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 Fall 09/17/2017  . Graves disease   . H/O ischemic left MCA stroke 09/17/2017  . Hemiparesis of right dominant side as late effect of cerebral infarction ([place]) 09/17/2017  . Hyperlipidemia   . Hypertension   . Neuropathy 05/19/2017   s/p stroke  . Plaque psoriasis   . Shoulder subluxation, right, initial encounter 09/17/2017  . Weakness 09/17/2017    Past Surgical History:  Procedure Laterality Date  . NO PAST SURGERIES      Vitals:   06/28/19 1112  BP: 111/68  Pulse: 69                    [facility] Adult PT Treatment/Exercise - 06/29/19 0001      Ambulation/Gait   Gait  Comments  Pt initially adamant about using WalkAide with gait today with no device and no AFO. Pt unable to recall where she got it from/how long she has had it. Due to time constraints/pt feeling dizzy from her Tramadol medication, unable to assess today. Will practice at next session.       Therapeutic Activites    Therapeutic Activities  Other Therapeutic Activities    Other Therapeutic Activities  Pt wishing to review floor > stand transfers. Working on technique for getting down to the floor with therapist providing min A to RLE. Verbal and demonstrative cueing to come into half kneeling position and using L UE on solid surface to come to stand. Increased time coming to stand. After performing transfer, pt reporting feeling dizzy from Tramadol medication and did not wish to perform another rep.        Access Code: [email or web address]  URL: [email or web address]  Date: 06/28/2019  Prepared by: Janann August   Reviewed pt's prior HEP: pt able to demonstrate all exercises after minimal verbal cues.   Exercises Heel rises with counter support - 10 reps - 1 sets - 2x daily - 7x weekly Standing Gastroc Stretch - 3 reps - 1 sets - 30 seconds hold - 2x daily - 7x weekly Wall Squat - 10 reps - 1 sets - 2x daily - 7x weekly Bridge with Hip Abduction and Resistance - 10 reps - 1 sets -  2x daily - 7x weekly Seated Heel Slide - 10 reps - 2 sets - 1x daily - 7x weekly Single Leg Stance - 3 reps - 1 sets - 10 seconds hold - 1x daily - 7x weekly Supine March - 10 reps - 2 sets - 1x daily - 7x weekly Sit to Stand without Arm Support - 10 reps - 2 sets - 1x daily - 7x weekly         PT Short Term Goals - 05/30/19 1835      PT SHORT TERM GOAL #1   Title  The patient will be able to perform HEP mod indep for LE strength (ankle isolated control, hamstring, hip extensors). ALL LTGS DUE 07/18/19    Time  7   7 weeks due to delay in scheduling.   Period  Weeks    Status  New    Target Date   07/18/19      PT SHORT TERM GOAL #2   Title  Patient will improve BERG balance score to at leas a 44/56 in order to demo improved balance.    Baseline  42/56    Time  7    Period  Weeks    Status  New      PT SHORT TERM GOAL #3   Title  Patient will ambulate at least 500' outdoors with mod I in order to demo improved endurance for functional gait.    Time  7    Period  Weeks    Status  New      PT SHORT TERM GOAL #4   Title  Patient will decrease TUG to 16 seconds or less with SPC in order to demo decr fall risk.    Baseline  17.88 seconds        PT Long Term Goals - 05/30/19 1838      PT LONG TERM GOAL #1   Title  The patient will negotiate a curb mod indep. ALL LTGS DUE 08/08/19    Time  10   10 weeks due to delay in scheduling   Period  Weeks    Target Date  08/08/19      PT LONG TERM GOAL #2   Title  The patient will move floor<>stand with mod indep using L UE support on surface.    Baseline  not yet assessed.    Time  10    Period  Weeks    Status  New      PT LONG TERM GOAL #3   Title  The patient will improve gait speed without device from 1.33 ft/sec to > or equal to 1.8 ft/sec to demo improving balance/stability without a device.    Baseline  1.33 ft/sec no AD    Status  New      PT LONG TERM GOAL #4   Title  Patient will improve BERG balance score to a 47/56 in order to demo decr fall risk.    Baseline  42/56    Time  10    Period  Weeks    Status  New      PT LONG TERM GOAL #5   Title  Patient will be independent with final HEP in order to build upon functional gains made in therapy.    Time  10    Period  Weeks    Status  New      Additional Long Term Goals   Additional Long Term Goals  Yes  PT LONG TERM GOAL #6   Title  6MWT goal as appropriate to measure improved endurance for gait.    Baseline  not yet assessed.    Time  10    Period  Weeks    Status  New            Plan - 06/29/19 2135    Clinical Impression Statement   Today's skilled session focused on practicing floor <> stand transfer and reviewing prior HEP from pt's previous course of PT. Pt needed initial verbal cues for exercises. After single floor to stand transfer pt reported increased dizziness (from taking her Tramadol medication this morning) and did not wish to perform any additional transfers today. Pt wanting to practice with Walk Aide for DF for gait - will practice at next session. Will continue to progress towards LTGs.    Personal Factors and Comorbidities  Comorbidity 3+;Past/Current Experience;Time since onset of injury/illness/exacerbation    Comorbidities  graves disease, HLD, HTN    Examination-Activity Limitations  Stairs;Transfers;Locomotion Level    Examination-Participation Restrictions  Community Activity    Stability/Clinical Decision Making  Stable/Uncomplicated    Rehab Potential  Good    PT Frequency  2x / week    PT Duration  6 weeks    PT Treatment/Interventions  ADLs/Self Care Home Management;Aquatic Therapy;Electrical Stimulation;Therapeutic exercise;Therapeutic activities;Functional mobility training;Stair training;Gait training;DME Instruction;Balance training;Neuromuscular re-education;Patient/family education;Orthotic Fit/Training;Energy conservation;Passive range of motion    PT Next Visit Plan  did pt bring in Walk Aide again? add to HEP as appropriate.  assess curb, floor > mat transfers. tall kneeling, weight shifting to R as well as R hip flexion.    PT Home Exercise Plan  [email or web address]    Consulted and Agree with Plan of Care  Patient       Patient will benefit from skilled therapeutic intervention in order to improve the following deficits and impairments:  Abnormal gait, Decreased balance, Decreased activity tolerance, Decreased coordination, Decreased endurance, Decreased range of motion, Difficulty walking, Decreased strength, Impaired tone, Pain  Visit Diagnosis: Hemiparesis of right dominant side as late effect of  cerebral infarction [facility])  Other lack of coordination  Other symptoms and signs involving the nervous system  Unsteadiness on feet     Problem List Patient Active Problem List   Diagnosis Date Noted  . Hematuria 05/31/2019  . Encounter for chronic pain management 01/22/2019  . Right spastic hemiplegia ([place]) 11/27/2018  . Elevated LFTs 11/20/2018  . 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 Unintentional weight loss 11/05/2018  . Fatigue 11/05/2018  . Bloody stool 11/05/2018  . Polyarthralgia 11/05/2018  . Chronic anticoagulation 11/05/2018  . Difficulty coping with disease 03/31/2018  . Hemiparesis of right dominant side as late effect of cerebral infarction ([place]) 09/17/2017  . Cardioembolic stroke ([place]) [id number]  . 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 Cardiomyopathy ([place]) 09/17/2017  . H/O ischemic left MCA stroke 09/17/2017  . Cardiac LV ejection fraction 21-30%   . Graves disease   . Plaque psoriasis   . Neuropathy 05/19/2017    Arliss Journey, PT, DPT  06/29/2019, 9:37 PM  [facility] [address] [place], [place], [address] Phone: [phone]   Fax:  [phone]  Name: Annyssa Moffit MRN: [id number] Date of Birth: 07/11/1960

## 2019-06-30 ENCOUNTER — Encounter: Payer: Self-pay | Admitting: Family Medicine

## 2019-07-01 ENCOUNTER — Encounter: Payer: Self-pay | Admitting: Physical Therapy

## 2019-07-02 ENCOUNTER — Other Ambulatory Visit: Payer: Self-pay

## 2019-07-02 ENCOUNTER — Ambulatory Visit (INDEPENDENT_AMBULATORY_CARE_PROVIDER_SITE_OTHER): Payer: BLUE CROSS/BLUE SHIELD | Admitting: Family Medicine

## 2019-07-02 ENCOUNTER — Ambulatory Visit: Payer: BC Managed Care – PPO | Admitting: Physical Therapy

## 2019-07-02 ENCOUNTER — Encounter: Payer: Self-pay | Admitting: Family Medicine

## 2019-07-02 VITALS — BP 108/82 | HR 60 | Temp 98.3°F | Resp 17 | Ht 62.0 in | Wt 120.0 lb

## 2019-07-02 DIAGNOSIS — I69351 Hemiplegia and hemiparesis following cerebral infarction affecting right dominant side: Secondary | ICD-10-CM | POA: Diagnosis not present

## 2019-07-02 DIAGNOSIS — R319 Hematuria, unspecified: Secondary | ICD-10-CM

## 2019-07-02 DIAGNOSIS — R1030 Lower abdominal pain, unspecified: Secondary | ICD-10-CM

## 2019-07-02 LAB — POCT URINALYSIS DIPSTICK
Bilirubin, UA: NEGATIVE
Glucose, UA: NEGATIVE
Ketones, UA: NEGATIVE
Leukocytes, UA: NEGATIVE
Nitrite, UA: NEGATIVE
Protein, UA: NEGATIVE
Spec Grav, UA: 1.01 (ref 1.010–1.025)
Urobilinogen, UA: 0.2 E.U./dL
pH, UA: 6.5 (ref 5.0–8.0)

## 2019-07-02 NOTE — Patient Instructions (Signed)
Pick up your tramadol at [facility] today. I will also put an order in to your express scripts so they start sending it after.  You still have blood in your urine. We will call you next week to step up a CT scan of your stomach  After we get it approved.   I will place an order for a foot doctor.   covid vaccines: Pay attention to the news the next few weeks- COVID vaccines are starting for the community.  Appointments are required and can be made beginning at 8 a.m. Once your age bracket is called (watch news and visit website below for info) appt can be made by calling [phone] and selecting option 2. Walk-ins will not be accepted.                  Clinic locations are: [facility], [place]; [facility] [address] [place]; . [place] at [facility], [address], [id number], [place].  Participants are asked to wear a face covering at vaccination sites.  Visit [email or web address] and click on the "[id number] Vaccine Info" rectangle for more information about vaccinations.    Hematuria, Adult Hematuria is blood in the urine. Blood may be visible in the urine, or it may be identified with a test. This condition can be caused by infections of the bladder, urethra, kidney, or prostate. Other possible causes include:  Kidney stones.  Cancer of the urinary tract.  Too much calcium in the urine.  Conditions that are passed from parent to child (inherited conditions).  Exercise that requires a lot of energy. Infections can usually be treated with medicine, and a kidney stone usually will pass through your urine. If neither of these is the cause of your hematuria, more tests may be needed to identify the cause of your symptoms. It is very important to tell your health care provider about any blood in your urine, even if it is painless or the blood stops without  treatment. Blood in the urine, when it happens and then stops and then happens again, can be a symptom of a very serious condition, including cancer. There is no pain in the initial stages of many urinary cancers. Follow these instructions at home: Medicines  Take over-the-counter and prescription medicines only as told by your health care provider.  If you were prescribed an antibiotic medicine, take it as told by your health care provider. Do not stop taking the antibiotic even if you start to feel better. Eating and drinking  Drink enough fluid to keep your urine clear or pale yellow. It is recommended that you drink 3-4 quarts (2.8-3.8 L) a day. If you have been diagnosed with an infection, it is recommended that you drink cranberry juice in addition to large amounts of water.  Avoid caffeine, tea, and carbonated beverages. These tend to irritate the bladder.  Avoid alcohol because it may irritate the prostate (men). General instructions  If you have been diagnosed with a kidney stone, follow your health care provider's instructions about straining your urine to catch the stone.  Empty your bladder often. Avoid holding urine for long periods of time.  If you are female: ? After a bowel movement, wipe from front to back and use each piece of toilet paper only once. ? Empty your bladder before and after sex.  Pay attention to any changes in your symptoms. Tell your health  care provider about any changes or any new symptoms.  It is your responsibility to get your test results. Ask your health care provider, or the department performing the test, when your results will be ready.  Keep all follow-up visits as told by your health care provider. This is important. Contact a health care provider if:  You develop back pain.  You have a fever.  You have nausea or vomiting.  Your symptoms do not improve after 3 days.  Your symptoms get worse. Get help right away if:  You develop  severe vomiting and are unable take medicine without vomiting.  You develop severe pain in your back or abdomen even though you are taking medicine.  You pass a large amount of blood in your urine.  You pass blood clots in your urine.  You feel very weak or like you might faint.  You faint. Summary  Hematuria is blood in the urine. It has many possible causes.  It is very important that you tell your health care provider about any blood in your urine, even if it is painless or the blood stops without treatment.  Take over-the-counter and prescription medicines only as told by your health care provider.  Drink enough fluid to keep your urine clear or pale yellow. This information is not intended to replace advice given to you by your health care provider. Make sure you discuss any questions you have with your health care provider. Document Revised: 11/04/2018 Document Reviewed: 07/13/2016 Elsevier Patient Education  2020 [employer].

## 2019-07-02 NOTE — Progress Notes (Signed)
Margaret Ortiz, Margaret Ortiz Jan 02, 1961, 59 y.o., female MRN: [id number] Patient Care Team    Relationship Specialty Notifications Start End  Margaret Hillock, DO PCP - General Family Medicine  09/16/17   Margaret Booze, MD Consulting Physician Cardiology  05/18/18   Margaret Ortiz, [place] Physician Neurology  05/18/18     Chief Complaint  Patient presents with  . Follow-up    F/U for blood in urine 6 weeks ago.      Subjective: Pt presents for an OV to follow up on her hematuria.  Hematuria: Patient presents today to follow-up on hematuria.  She complained of urinary symptoms approximately 1 month ago, urinalysis was positive for 2+ hemoglobin.  She returns today and states she is still having some urinary symptoms and lower abdominal discomfort on the right on occasion.  Denies any dysuria.  Patient also complains of discomfort again today and multiple joints.  She has tramadol prescribed to her for her chronic pain.  She states again today that she has not been using the tramadol for pain.  She is having difficulty getting to the pharmacy to get her prescription and would like it called into her mail-in pharmacy.  No flowsheet data found.  Allergies  Allergen Reactions  . Dust Mite Extract   . Penicillins    Social History   Social History Narrative   Divorced.  From [place], moved to [place] to stay with her family after having a stroke March 2019   Graduate degree, works as a Community education officer.   Former smoker.   Exercise routinely prior to stroke.   Wears a hearing aid.   Smoke alarms in the home.   Wears her seatbelt.      Patient is right-handed. She drinks two cup of coffee a day. She now resides with her sister and brother-in-law.   Past Medical History:  Diagnosis Date  . Aphasia 09/17/2017  . 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  . Cardiac LV ejection fraction 21-30%    . Cardioembolic stroke ([place]) [id number]   Right hemiparesis  . Cardiomyopathy ([place]) 04/2017  . Dysphagia 05/19/2017   Last known diet upgraded to regular diet with thin liquids on 06/27/2017, no records available  . 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 Fall 09/17/2017  . Graves disease   . H/O ischemic left MCA stroke 09/17/2017  . Hemiparesis of right dominant side as late effect of cerebral infarction ([place]) 09/17/2017  . Hyperlipidemia   . Hypertension   . Neuropathy 05/19/2017   s/p stroke  . Plaque psoriasis   . Shoulder subluxation, right, initial encounter 09/17/2017  . Weakness 09/17/2017   Past Surgical History:  Procedure Laterality Date  . NO PAST SURGERIES     Family History  Problem Relation Age of Onset  . Hyperlipidemia Mother   . Hypertension Mother   . Early death Father   . Hypertension Sister   . Heart attack Brother   . Mental illness Brother    Allergies as of 07/02/2019      Reactions   Dust Mite Extract    Penicillins       Medication List       Accurate as of July 02, 2019 11:59 PM. If you have any questions, ask your nurse or doctor.        STOP taking these medications   nitrofurantoin (macrocrystal-monohydrate) 100  MG capsule Commonly known as: Macrobid Stopped by: Margaret Pouch, DO     TAKE these medications   apixaban 5 MG Tabs tablet Commonly known as: Eliquis Take 1 tablet (5 mg total) by mouth 2 (two) times daily.   atorvastatin 80 MG tablet Commonly known as: LIPITOR Take 1 tablet (80 mg total) by mouth daily.   carvedilol 3.125 MG tablet Commonly known as: COREG Take 1 tablet (3.125 mg total) by mouth 2 (two) times daily.   clobetasol cream 0.05 % Commonly known as: TEMOVATE Apply 1 application topically 2 (two) times daily.   esomeprazole 40 MG capsule Commonly known as: NEXIUM Take 1 capsule (40 mg total) by mouth daily at 12 noon.   gabapentin 300 MG capsule Commonly known as: NEURONTIN Take  2 capsules in the am and 2 capsules in the pm   OVER THE COUNTER MEDICATION CBD Oil   traMADol 50 MG tablet Commonly known as: ULTRAM Take 1 tablet (50 mg total) by mouth every 12 (twelve) hours as needed.       All past medical history, surgical history, allergies, family history, immunizations andmedications were updated in the EMR today and reviewed under the history and medication portions of their EMR.     ROS: Negative, with the exception of above mentioned in HPI   Objective:  BP 108/82 (BP Location: Left Arm, Patient Position: Sitting, Cuff Size: Normal)   Pulse 60   Temp 98.3 F (36.8 C) (Temporal)   Resp 17   Ht 5\' 2"  (1.575 m)   Wt 120 lb (54.4 kg)   SpO2 95%   BMI 21.95 kg/m  Body mass index is 21.95 kg/m. Gen: Afebrile. No acute distress.  Tearful today. HENT: AT. [place].  Eyes:Pupils Equal Round Reactive to light, Extraocular movements intact,  Conjunctiva without redness, discharge or icterus. CV: RRR, no edema, +2/4 P posterior tibialis pulses Chest: CTAB, no wheeze or crackles Abd: Soft. NTND. BS present.  No masses palpated.  Neuro:  Alert. Oriented.  Psych: Tearful today.  Normal affect, dress and demeanor. Normal speech. Normal thought content and judgment.    No exam data present No results found. No results found for this or any previous visit (from the past 24 hour(s)).  Assessment/Plan: Margaret Ortiz is a 59 y.o. 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  Encouraged her to pick up 1 more prescription at her local pharmacy today and restart her medication. Continue gabapentin-  can take 1 tab in morning if desired and 2 tabs evening. She reports 2 tabs in the morning was causing SE.  Continue PT tomorrow.  Refilled tramadol- take at least once day, may take every 12 hours.  -[place] controlled substance database reviewed and appropriate today. -NSAIDs contraindicated secondary to chronic anticoagulation -Tylenol contraindicated secondary to elevated liver enzymes with the use. Discussed with her today if pain is not controlled with regimen>> then would refer her back to orthopedic to see if they have anything further to offer her.  Follow-up every 5.5 months if desiring continued pain medication refills.  Urinary frequency/hematuria -Patient's urine today with 2+ hemoglobin via microanalysis remains. -She is a former smoker.  Complaining of lower right-sided abdominal discomfort with  urination. -We will order CT renal stone study and refer to urology.  Podiatry referral placed for her at her request for shoe fitting surrounding her foot brace.  Orders Placed This Encounter  Procedures  . MICROSCOPIC MESSAGE  . CT RENAL STONE STUDY  . Urinalysis, Routine w reflex microscopic  . CBC  . Basic Metabolic Panel (BMET)  . Ambulatory referral to Podiatry  . Ambulatory referral to Urology  . POCT Urinalysis Dipstick    Referral Orders     Ambulatory referral to Podiatry     Ambulatory referral to Urology    Reviewed expectations re: course of current medical issues.  Discussed self-management of symptoms.  Outlined signs and symptoms indicating need for more acute intervention.  Patient verbalized understanding and all questions were answered.  Patient received an After-Visit Summary.    Orders Placed This Encounter  Procedures  . MICROSCOPIC MESSAGE  . CT RENAL STONE STUDY  . Urinalysis, Routine w reflex microscopic  . CBC  . Basic Metabolic Panel (BMET)  . Ambulatory referral to Podiatry  . Ambulatory referral to Urology  . POCT  Urinalysis Dipstick   This visit occurred during the SARS-CoV-2 public health emergency.  Safety protocols were in place, including screening questions prior to the visit, additional usage of staff PPE, and extensive cleaning of exam room while observing appropriate contact time as indicated for disinfecting solutions.     Note is dictated utilizing voice recognition software. Although note has been proof read prior to signing, occasional typographical errors still can be missed. If any questions arise, please do not hesitate to call for verification.   electronically signed by:  Margaret Pouch, DO  [place]

## 2019-07-03 LAB — BASIC METABOLIC PANEL
BUN: 14 mg/dL (ref 7–25)
CO2: 27 mmol/L (ref 20–32)
Calcium: 9.9 mg/dL (ref 8.6–10.4)
Chloride: 100 mmol/L (ref 98–110)
Creat: 0.89 mg/dL (ref 0.50–1.05)
Glucose, Bld: 93 mg/dL (ref 65–99)
Potassium: 4.4 mmol/L (ref 3.5–5.3)
Sodium: 138 mmol/L (ref 135–146)

## 2019-07-03 LAB — CBC
HCT: 43.1 % (ref 35.0–45.0)
Hemoglobin: 13.9 g/dL (ref 11.7–15.5)
MCH: 29.3 pg (ref 27.0–33.0)
MCHC: 32.3 g/dL (ref 32.0–36.0)
MCV: 90.9 fL (ref 80.0–100.0)
MPV: 10.1 fL (ref 7.5–12.5)
Platelets: 261 10*3/uL (ref 140–400)
RBC: 4.74 10*6/uL (ref 3.80–5.10)
RDW: 11.9 % (ref 11.0–15.0)
WBC: 6.8 10*3/uL (ref 3.8–10.8)

## 2019-07-03 LAB — URINALYSIS, ROUTINE W REFLEX MICROSCOPIC
Bacteria, UA: NONE SEEN /HPF
Bilirubin Urine: NEGATIVE
Glucose, UA: NEGATIVE
Hyaline Cast: NONE SEEN /LPF
Ketones, ur: NEGATIVE
Leukocytes,Ua: NEGATIVE
Nitrite: NEGATIVE
Protein, ur: NEGATIVE
Specific Gravity, Urine: 1.011 (ref 1.001–1.03)
Squamous Epithelial / HPF: NONE SEEN /HPF (ref <=5)
pH: 7 (ref 5.0–8.0)

## 2019-07-05 ENCOUNTER — Encounter: Payer: Self-pay | Admitting: Physical Therapy

## 2019-07-05 ENCOUNTER — Ambulatory Visit: Payer: BLUE CROSS/BLUE SHIELD | Admitting: Physical Therapy

## 2019-07-05 ENCOUNTER — Encounter: Payer: Self-pay | Admitting: Family Medicine

## 2019-07-05 MED ORDER — TRAMADOL HCL 50 MG PO TABS: 50.0000 mg | ORAL_TABLET | Freq: Two times a day (BID) | ORAL | 5 refills | Status: DC | PRN

## 2019-07-07 ENCOUNTER — Ambulatory Visit: Payer: BLUE CROSS/BLUE SHIELD | Admitting: Occupational Therapy

## 2019-07-09 ENCOUNTER — Encounter: Payer: Self-pay | Admitting: Physical Therapy

## 2019-07-09 ENCOUNTER — Ambulatory Visit: Payer: BLUE CROSS/BLUE SHIELD | Admitting: Physical Therapy

## 2019-07-09 ENCOUNTER — Other Ambulatory Visit: Payer: Self-pay

## 2019-07-09 DIAGNOSIS — R278 Other lack of coordination: Secondary | ICD-10-CM

## 2019-07-09 DIAGNOSIS — R29818 Other symptoms and signs involving the nervous system: Secondary | ICD-10-CM

## 2019-07-09 DIAGNOSIS — R2681 Unsteadiness on feet: Secondary | ICD-10-CM

## 2019-07-09 DIAGNOSIS — I69351 Hemiplegia and hemiparesis following cerebral infarction affecting right dominant side: Secondary | ICD-10-CM | POA: Diagnosis not present

## 2019-07-09 DIAGNOSIS — M6281 Muscle weakness (generalized): Secondary | ICD-10-CM

## 2019-07-10 NOTE — Therapy (Signed)
[facility] [address] [place], [place], [address] Phone: [phone]   Fax:  [phone]  Physical Therapy Treatment  Patient Details  Name: Margaret Ortiz MRN: [id number] Date of Birth: [birth year] Referring Provider (PT): Howard Pouch   Encounter Date: 07/09/2019  PT End of Session - 07/10/19 2026    Visit Number  3    Number of Visits  13    Date for PT Re-Evaluation  08/08/19   due to delay in scheduling   Authorization Type  BCBCS    PT Start Time  [id number]    PT Stop Time  1445    PT Time Calculation (min)  42 min    Equipment Utilized During Treatment  Gait belt    Activity Tolerance  Patient tolerated treatment well    Behavior During Therapy  [facility] for tasks assessed/performed;Restless       Past Medical History:  Diagnosis Date  . Aphasia 09/17/2017  . 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  . Cardiac LV ejection fraction 21-30%   . Cardioembolic stroke ([place]) [id number]   Right hemiparesis  . Cardiomyopathy ([place]) 04/2017  . Dysphagia 05/19/2017   Last known diet upgraded to regular diet with thin liquids on 06/27/2017, no records available  . 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 Fall 09/17/2017  . Graves disease   . H/O ischemic left MCA stroke 09/17/2017  . Hemiparesis of right dominant side as late effect of cerebral infarction ([place]) 09/17/2017  . Hyperlipidemia   . Hypertension   . Neuropathy 05/19/2017   s/p stroke  . Plaque psoriasis   . Shoulder subluxation, right, initial encounter 09/17/2017  . Weakness 09/17/2017    Past Surgical History:  Procedure Laterality Date  . NO PAST SURGERIES      There were no vitals filed for this visit.  Subjective Assessment - 07/09/19 1410    Subjective  Been doing ok. Had a near fall yesterday - thinks she might have been dizzy yesterday. Wants  to learn how to use her Walk Aide - states she received it from [place] a couple months ago. Also wants to practice getting up off the floor.    Pertinent History  PMH: graves disease, HLD, HTN    How long can you walk comfortably?  walks everyday - 30 minutes in the morning and 30 minutes in the afternoon    Patient Stated Goals  wants to work on walking and resistance work, wants stability exercises. wants to know how to get up off the floor - reports she really needs to work on flexion and ABD                       [facility] Adult PT Treatment/Exercise - 07/10/19 0001      Therapeutic Activites    Therapeutic Activities  Other Therapeutic Activities    Other Therapeutic Activities  Pt bringing in [place] and asking therapist to help set it up to ambulate with no brace and no AD- while pt impulsively goes to put it on without wetting the pt's skin/electrodes. Therapist educating pt on safety of use and needing to wet electrodes prior to use - also pt bringing in old electrodes that would not be safe to use. Therapist educating pt to bring in new electrodes to next session (as pt states that she has some at home).  Unclear due to pt's aphasia if she has been using at home. Educated pt to continue using her R AFO as it is the most safe and functional way to ambulate with SPC to decr risk of falls. Pt requesting to work on floor transfers again today - getting up and down with LUE on solid surface to help lower, needed min A to help RLE get into position, pt takes increased time getting to floor. 2 reps, needs initial cues to weight shift towards R to step LLE forward before standing back up with assist of LUE on mat.       Neuro Re-ed    Neuro Re-ed Details   In tall kneeling on red floor mat: tall kneeling mini squats 2 x 10 reps without UE support - cues for posture and glute activation at the top, L hip ABD 2 x 10 reps for increased weight shifting onto RLE, 1 x 10 reps R hip ABD - with  therapist providing intermittent manual cues for proper positioning. 2 x 10 reps RLE hip flexion and extension - needed initial facilitation for hip flexion.  In tall kneeling position reaching towards R with LUE towards cone for weight shifting and core activation.              PT Education - 07/10/19 2025    Education Details  bringing in new electrodes to next session, use of AFO for safe and functional gait    Person(s) Educated  Patient    Methods  Explanation    Comprehension  Verbalized understanding;Need further instruction       PT Short Term Goals - 05/30/19 1835      PT SHORT TERM GOAL #1   Title  The patient will be able to perform HEP mod indep for LE strength (ankle isolated control, hamstring, hip extensors). ALL LTGS DUE 07/18/19    Time  7   7 weeks due to delay in scheduling.   Period  Weeks    Status  New    Target Date  07/18/19      PT SHORT TERM GOAL #2   Title  Patient will improve BERG balance score to at leas a 44/56 in order to demo improved balance.    Baseline  42/56    Time  7    Period  Weeks    Status  New      PT SHORT TERM GOAL #3   Title  Patient will ambulate at least 500' outdoors with mod I in order to demo improved endurance for functional gait.    Time  7    Period  Weeks    Status  New      PT SHORT TERM GOAL #4   Title  Patient will decrease TUG to 16 seconds or less with SPC in order to demo decr fall risk.    Baseline  17.88 seconds        PT Long Term Goals - 05/30/19 1838      PT LONG TERM GOAL #1   Title  The patient will negotiate a curb mod indep. ALL LTGS DUE 08/08/19    Time  10   10 weeks due to delay in scheduling   Period  Weeks    Target Date  08/08/19      PT LONG TERM GOAL #2   Title  The patient will move floor<>stand with mod indep using L UE support on surface.    Baseline  not yet  assessed.    Time  10    Period  Weeks    Status  New      PT LONG TERM GOAL #3   Title  The patient will improve  gait speed without device from 1.33 ft/sec to > or equal to 1.8 ft/sec to demo improving balance/stability without a device.    Baseline  1.33 ft/sec no AD    Status  New      PT LONG TERM GOAL #4   Title  Patient will improve BERG balance score to a 47/56 in order to demo decr fall risk.    Baseline  42/56    Time  10    Period  Weeks    Status  New      PT LONG TERM GOAL #5   Title  Patient will be independent with final HEP in order to build upon functional gains made in therapy.    Time  10    Period  Weeks    Status  New      Additional Long Term Goals   Additional Long Term Goals  Yes      PT LONG TERM GOAL #6   Title  6MWT goal as appropriate to measure improved endurance for gait.    Baseline  not yet assessed.    Time  10    Period  Weeks    Status  New            Plan - 07/10/19 2028    Clinical Impression Statement  Pt brought in [place] to session today that she reports she has received months ago from [place] (unclear if pt has been using at home due to aphasia). Pt insisting to use it today and impulsively trying to put it on without adhering to safety measures (wetting the electrodes, pt's skin). Therapist stating that the electrodes pt brought in are too old to use and for safety to bring in a new pair of electrodes to next session. Educated pt that AFO would be most functional for gait. Remainder of session focused on LE strengthening/weight shifting in tall kneeling. Will continue to progress towards LTGs.    Personal Factors and Comorbidities  Comorbidity 3+;Past/Current Experience;Time since onset of injury/illness/exacerbation    Comorbidities  graves disease, HLD, HTN    Examination-Activity Limitations  Stairs;Transfers;Locomotion Level    Examination-Participation Restrictions  Community Activity    Stability/Clinical Decision Making  Stable/Uncomplicated    Rehab Potential  Good    PT Frequency  2x / week    PT Duration  6 weeks    PT  Treatment/Interventions  ADLs/Self Care Home Management;Aquatic Therapy;Electrical Stimulation;Therapeutic exercise;Therapeutic activities;Functional mobility training;Stair training;Gait training;DME Instruction;Balance training;Neuromuscular re-education;Patient/family education;Orthotic Fit/Training;Energy conservation;Passive range of motion    PT Next Visit Plan  trial Wake Aide? add to HEP as appropriate.  assess curb, floor > mat transfers. tall kneeling, weight shifting to R as well as R hip flexion.    PT Home Exercise Plan  [id number]    Consulted and Agree with Plan of Care  Patient       Patient will benefit from skilled therapeutic intervention in order to improve the following deficits and impairments:  Abnormal gait, Decreased balance, Decreased activity tolerance, Decreased coordination, Decreased endurance, Decreased range of motion, Difficulty walking, Decreased strength, Impaired tone, Pain  Visit Diagnosis: Other lack of coordination  Other symptoms and signs involving the nervous system  Unsteadiness on feet  Muscle weakness (generalized)  Problem List Patient Active Problem List   Diagnosis Date Noted  . Hematuria 05/31/2019  . Encounter for chronic pain management 01/22/2019  . Right spastic hemiplegia ([place]) 11/27/2018  . Elevated LFTs 11/20/2018  . 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 Unintentional weight loss 11/05/2018  . Fatigue 11/05/2018  . Bloody stool 11/05/2018  . Polyarthralgia 11/05/2018  . Chronic anticoagulation 11/05/2018  . Difficulty coping with disease 03/31/2018  . Hemiparesis of right dominant side as late effect of cerebral infarction ([place]) 09/17/2017  . Cardioembolic stroke ([place]) [id number]  . 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 Cardiomyopathy ([place]) 09/17/2017  . H/O ischemic left MCA stroke 09/17/2017  . Cardiac LV ejection fraction 21-30%   .  Graves disease   . Plaque psoriasis   . Neuropathy 05/19/2017    Arliss Journey, PT, DPT  07/10/2019, 8:50 PM  [facility] [address] [place], [place], [address] Phone: [phone]   Fax:  [phone]  Name: Margaret Ortiz MRN: [id number] Date of Birth: [birth year]

## 2019-07-12 ENCOUNTER — Ambulatory Visit: Payer: BC Managed Care – PPO | Admitting: Physical Therapy

## 2019-07-13 ENCOUNTER — Ambulatory Visit (HOSPITAL_BASED_OUTPATIENT_CLINIC_OR_DEPARTMENT_OTHER): Admission: RE | Admit: 2019-07-13 | Payer: BLUE CROSS/BLUE SHIELD | Source: Ambulatory Visit

## 2019-07-14 ENCOUNTER — Other Ambulatory Visit: Payer: Self-pay

## 2019-07-14 ENCOUNTER — Ambulatory Visit: Payer: BLUE CROSS/BLUE SHIELD | Admitting: Physical Therapy

## 2019-07-14 DIAGNOSIS — M6281 Muscle weakness (generalized): Secondary | ICD-10-CM

## 2019-07-14 DIAGNOSIS — R2689 Other abnormalities of gait and mobility: Secondary | ICD-10-CM

## 2019-07-14 DIAGNOSIS — R2681 Unsteadiness on feet: Secondary | ICD-10-CM

## 2019-07-14 DIAGNOSIS — I69351 Hemiplegia and hemiparesis following cerebral infarction affecting right dominant side: Secondary | ICD-10-CM | POA: Diagnosis not present

## 2019-07-15 ENCOUNTER — Ambulatory Visit (HOSPITAL_BASED_OUTPATIENT_CLINIC_OR_DEPARTMENT_OTHER)
Admission: RE | Admit: 2019-07-15 | Discharge: 2019-07-15 | Disposition: A | Discharge status: Home / Self Care | Payer: BLUE CROSS/BLUE SHIELD | Source: Ambulatory Visit | Attending: Family Medicine | Admitting: Family Medicine

## 2019-07-15 DIAGNOSIS — R1030 Lower abdominal pain, unspecified: Secondary | ICD-10-CM | POA: Insufficient documentation

## 2019-07-15 DIAGNOSIS — R319 Hematuria, unspecified: Secondary | ICD-10-CM | POA: Diagnosis not present

## 2019-07-15 NOTE — Therapy (Signed)
[facility] [address] [place], [place], [address] Phone: [phone]   Fax:  [phone]  Physical Therapy Treatment  Patient Details  Name: Margaret Ortiz MRN: [id number] Date of Birth: 1961/01/17 Referring Provider (PT): Howard Pouch   Encounter Date: 07/14/2019  PT End of Session - 07/14/19 1318    Visit Number  4    Number of Visits  13    Date for PT Re-Evaluation  08/08/19   due to delay in scheduling   Authorization Type  BCBCS    PT Start Time  1230    PT Stop Time  1315    PT Time Calculation (min)  45 min    Activity Tolerance  Patient tolerated treatment well    Behavior During Therapy  [facility] for tasks assessed/performed       Past Medical History:  Diagnosis Date  . Aphasia 09/17/2017  . 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  . Cardiac LV ejection fraction 21-30%   . Cardioembolic stroke ([place]) [id number]   Right hemiparesis  . Cardiomyopathy ([place]) 04/2017  . Dysphagia 05/19/2017   Last known diet upgraded to regular diet with thin liquids on 06/27/2017, no records available  . 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 Fall 09/17/2017  . Graves disease   . H/O ischemic left MCA stroke 09/17/2017  . Hemiparesis of right dominant side as late effect of cerebral infarction ([place]) 09/17/2017  . Hyperlipidemia   . Hypertension   . Neuropathy 05/19/2017   s/p stroke  . Plaque psoriasis   . Shoulder subluxation, right, initial encounter 09/17/2017  . Weakness 09/17/2017    Past Surgical History:  Procedure Laterality Date  . NO PAST SURGERIES      There were no vitals filed for this visit.  Subjective Assessment - 07/14/19 1234    Subjective  Brought in her [place] today. Has been using at home. Also walks with her AFO at home and always uses it walking it in the community. No falls. Not  taking her Tramadol for pain because it causes her problems.    Pertinent History  PMH: graves disease, HLD, HTN    How long can you walk comfortably?  walks everyday - 30 minutes in the morning and 30 minutes in the afternoon    Patient Stated Goals  wants to work on walking and resistance work, wants stability exercises. wants to know how to get up off the floor - reports she really needs to work on flexion and ABD                       [facility] Adult PT Treatment/Exercise - 07/15/19 0001      Ambulation/Gait   Curb  4: Min assist    Curb Details (indicate cue type and reason)  attempted curb with SPC - pt unable to perform today, would get into position with SPC on curb and LLE on step, pt unable to push through and perform curb even with therapist assistance.      Exercises   Exercises  Other Exercises    Other Exercises   Step ups with RLE onto 4" step - single UE support of LUE and // bars, cues for technique. Step taps with LLE for weight shifting towards R 2 x 10 reps onto 4" step, no UE support and min guard, pt lacking control  when stepping LLE back down to floor. Practiced ascending/descending 4" step in // bars with single UE support with LUE (leading with LLE and stepping down first with RLE) x 10 reps.       Modalities   Modalities  Teacher, English as a foreign language Location  Patient brought in [place] again from home today with new electrodes. Educated pt on proper safety precautions - wetting the electrodes with water before putting on skin, having WalkAide turned off until it is in proper positioning. Pt stating that they had WalkAide set up at intensity 2 on the dial. Trialed WalkAide in proper positioning - however pt demonstrating increased eversion contraction vs. DF. Trialed a short gait walk of approx.. 5' with SPC and Walk Aide on with continued eversion and decreased foot clearance. Educated pt that she would need  to make an appointment with [facility] (who gave pt WalkAide) to have parameters adjusted. Pt verbalized understanding. Educated not to use at home and to continue with using AFO.              PT Education - 07/14/19 1316    Education Details  calling [facility] to make an appointment via [facility] to have parameters adjusted, instructed pt not to use at home and to continue with AFO    Person(s) Educated  Patient    Methods  Explanation    Comprehension  Verbalized understanding       PT Short Term Goals - 05/30/19 1835      PT SHORT TERM GOAL #1   Title  The patient will be able to perform HEP mod indep for LE strength (ankle isolated control, hamstring, hip extensors). ALL LTGS DUE 07/18/19    Time  7   7 weeks due to delay in scheduling.   Period  Weeks    Status  New    Target Date  07/18/19      PT SHORT TERM GOAL #2   Title  Patient will improve BERG balance score to at leas a 44/56 in order to demo improved balance.    Baseline  42/56    Time  7    Period  Weeks    Status  New      PT SHORT TERM GOAL #3   Title  Patient will ambulate at least 500' outdoors with mod I in order to demo improved endurance for functional gait.    Time  7    Period  Weeks    Status  New      PT SHORT TERM GOAL #4   Title  Patient will decrease TUG to 16 seconds or less with SPC in order to demo decr fall risk.    Baseline  17.88 seconds        PT Long Term Goals - 05/30/19 1838      PT LONG TERM GOAL #1   Title  The patient will negotiate a curb mod indep. ALL LTGS DUE 08/08/19    Time  10   10 weeks due to delay in scheduling   Period  Weeks    Target Date  08/08/19      PT LONG TERM GOAL #2   Title  The patient will move floor<>stand with mod indep using L UE support on surface.    Baseline  not yet assessed.    Time  10    Period  Weeks    Status  New  PT LONG TERM GOAL #3   Title  The patient will improve gait speed without device from 1.33 ft/sec to > or equal to 1.8  ft/sec to demo improving balance/stability without a device.    Baseline  1.33 ft/sec no AD    Status  New      PT LONG TERM GOAL #4   Title  Patient will improve BERG balance score to a 47/56 in order to demo decr fall risk.    Baseline  42/56    Time  10    Period  Weeks    Status  New      PT LONG TERM GOAL #5   Title  Patient will be independent with final HEP in order to build upon functional gains made in therapy.    Time  10    Period  Weeks    Status  New      Additional Long Term Goals   Additional Long Term Goals  Yes      PT LONG TERM GOAL #6   Title  6MWT goal as appropriate to measure improved endurance for gait.    Baseline  not yet assessed.    Time  10    Period  Weeks    Status  New            Plan - 07/15/19 1222    Clinical Impression Statement  Pt brought in [place] again today's session with new electrodes. Pt has been using intermittenly at home. Pt more patient today when therapist explaining safety precautions. Attempted to put it on, but pt demonstrating increased eversion in sitting and with short 5' trial of gait. Educated pt to bring to [employer] because she will need to have parameters adjusted. Remainder of session foucsed on weight shifting and strengthening activities to R. Attempted curb with SPC -pt unable to perform today. Will continue to progress towards LTGs.    Personal Factors and Comorbidities  Comorbidity 3+;Past/Current Experience;Time since onset of injury/illness/exacerbation    Comorbidities  graves disease, HLD, HTN    Examination-Activity Limitations  Stairs;Transfers;Locomotion Level    Examination-Participation Restrictions  Community Activity    Stability/Clinical Decision Making  Stable/Uncomplicated    Rehab Potential  Good    PT Frequency  2x / week    PT Duration  6 weeks    PT Treatment/Interventions  ADLs/Self Care Home Management;Aquatic Therapy;Electrical Stimulation;Therapeutic exercise;Therapeutic  activities;Functional mobility training;Stair training;Gait training;DME Instruction;Balance training;Neuromuscular re-education;Patient/family education;Orthotic Fit/Training;Energy conservation;Passive range of motion    PT Next Visit Plan  STGs due soon. did pt make appointment with [facility]? add to HEP as appropriate.  need practice with curb and floor <> mat transfers, tall kneeling, weight shifting to R as well as R hip flexion.    PT Home Exercise Plan  [id number]    Consulted and Agree with Plan of Care  Patient       Patient will benefit from skilled therapeutic intervention in order to improve the following deficits and impairments:  Abnormal gait, Decreased balance, Decreased activity tolerance, Decreased coordination, Decreased endurance, Decreased range of motion, Difficulty walking, Decreased strength, Impaired tone, Pain  Visit Diagnosis: Unsteadiness on feet  Muscle weakness (generalized)  Other abnormalities of gait and mobility     Problem List Patient Active Problem List   Diagnosis Date Noted  . Hematuria 05/31/2019  . Encounter for chronic pain management 01/22/2019  . Right spastic hemiplegia ([place]) 11/27/2018  . Elevated LFTs 11/20/2018  . 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 Unintentional weight loss 11/05/2018  . Fatigue 11/05/2018  . Bloody stool 11/05/2018  . Polyarthralgia 11/05/2018  . Chronic anticoagulation 11/05/2018  . Difficulty coping with disease 03/31/2018  . Hemiparesis of right dominant side as late effect of cerebral infarction ([place]) 09/17/2017  . Cardioembolic stroke ([place]) [id number]  . 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 Cardiomyopathy ([place]) 09/17/2017  . H/O ischemic left MCA stroke 09/17/2017  . Cardiac LV ejection fraction 21-30%   . Graves disease   . Plaque psoriasis   . Neuropathy 05/19/2017    Arliss Journey, PT, DPT  07/15/2019, 12:30  PM  [facility] [address] [place], [place], [address] Phone: [phone]   Fax:  [phone]  Name: Margaret Ortiz MRN: [id number] Date of Birth: 03-Mar-1961

## 2019-07-16 ENCOUNTER — Ambulatory Visit: Payer: BC Managed Care – PPO | Admitting: Physical Therapy

## 2019-07-16 ENCOUNTER — Telehealth: Payer: Self-pay | Admitting: Family Medicine

## 2019-07-16 DIAGNOSIS — D219 Benign neoplasm of connective and other soft tissue, unspecified: Secondary | ICD-10-CM

## 2019-07-16 DIAGNOSIS — N3289 Other specified disorders of bladder: Secondary | ICD-10-CM

## 2019-07-16 DIAGNOSIS — R102 Pelvic and perineal pain: Secondary | ICD-10-CM

## 2019-07-16 NOTE — Telephone Encounter (Signed)
Please inform patient the following information: Her CT scan of her abdomen showed normal kidneys without stones.   It did show a fibroid on the back of her uterus, that may be causing pressure in her pelvis and on her bladder and giving her the sensation she is feeling.  A fibroid is typically a noncancerous mass that occur on the uterus.  In order to further evaluate the cause of her discomfort and since she has the fibroid I have referred her to gynecology.  They are the type of specialist that would further evaluate this condition.  They also can inspect further within her bladder as potential cause if needed.   Of incidental note she also had evidence of lower back degenerative changes.  This also can potentially cause referred pain to the pelvis.    I suggest she first follow through with gynecology referral.  If we need to after gynecology evaluates her, we can then proceed with neurology if needed for her lower back.  If she would like me to explain all this to her in person, I would be happy to explain everything by her virtual video visit if she desires.

## 2019-07-19 ENCOUNTER — Telehealth: Payer: Self-pay | Admitting: *Deleted

## 2019-07-19 ENCOUNTER — Ambulatory Visit: Payer: BLUE CROSS/BLUE SHIELD | Admitting: Physical Therapy

## 2019-07-19 ENCOUNTER — Other Ambulatory Visit: Payer: Self-pay

## 2019-07-19 DIAGNOSIS — R2689 Other abnormalities of gait and mobility: Secondary | ICD-10-CM

## 2019-07-19 DIAGNOSIS — M6281 Muscle weakness (generalized): Secondary | ICD-10-CM

## 2019-07-19 DIAGNOSIS — I69351 Hemiplegia and hemiparesis following cerebral infarction affecting right dominant side: Secondary | ICD-10-CM | POA: Diagnosis not present

## 2019-07-19 DIAGNOSIS — R2681 Unsteadiness on feet: Secondary | ICD-10-CM

## 2019-07-19 DIAGNOSIS — R278 Other lack of coordination: Secondary | ICD-10-CM

## 2019-07-19 NOTE — Therapy (Signed)
[facility] [address] [place], [place], [address] Phone: [phone]   Fax:  [phone]  Physical Therapy Treatment  Patient Details  Name: Margaret Ortiz MRN: [id number] Date of Birth: 08/28/1960 Referring Provider (PT): Howard Pouch   Encounter Date: 07/19/2019  PT End of Session - 07/19/19 1219    Visit Number  5    Number of Visits  13    Date for PT Re-Evaluation  08/08/19   due to delay in scheduling   Authorization Type  BCBCS    PT Start Time  1102    PT Stop Time  1143    PT Time Calculation (min)  41 min    Equipment Utilized During Treatment  Gait belt    Activity Tolerance  Patient tolerated treatment well    Behavior During Therapy  [facility] for tasks assessed/performed       Past Medical History:  Diagnosis Date  . Aphasia 09/17/2017  . 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  . Cardiac LV ejection fraction 21-30%   . Cardioembolic stroke ([place]) 29/92/4268   Right hemiparesis  . Cardiomyopathy ([place]) 04/2017  . Dysphagia 05/19/2017   Last known diet upgraded to regular diet with thin liquids on 06/27/2017, no records available  . 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 Fall 09/17/2017  . Graves disease   . H/O ischemic left MCA stroke 09/17/2017  . Hemiparesis of right dominant side as late effect of cerebral infarction ([place]) 09/17/2017  . Hyperlipidemia   . Hypertension   . Neuropathy 05/19/2017   s/p stroke  . Plaque psoriasis   . Shoulder subluxation, right, initial encounter 09/17/2017  . Weakness 09/17/2017    Past Surgical History:  Procedure Laterality Date  . NO PAST SURGERIES      There were no vitals filed for this visit.  Subjective Assessment - 07/19/19 1111    Subjective  Is asking about joining a gym. Going to call [facility] about making an appointment regarding her [place].  No falls. States that her exercises have been going well at home.    Pertinent History  PMH: graves disease, HLD, HTN    How long can you walk comfortably?  walks everyday - 30 minutes in the morning and 30 minutes in the afternoon    Patient Stated Goals  wants to work on walking and resistance work, wants stability exercises. wants to know how to get up off the floor - reports she really needs to work on flexion and ABD    Currently in Pain?  Yes    Pain Score  6     Pain Location  Arm   all down right side.   Pain Orientation  Right              07/19/19 1127  Transfers  Transfers Sit to Stand;Stand to Sit  Sit to Stand 5: Supervision  Stand to Sit 5: Supervision  Comments Sit to stands at edge of mat table with orange disc under LLE for weight shifting to RLE 1 x 10 reps with min guard for balance, cues for eccentric control, however pt with poor descent, needs intermittent UE support to stand.  Additional 1 x 10 reps with 2" block under LLE, pt able to perform with more controlled descent and no UE support.   Berg Balance Test  Sit to Stand 4  Standing  Unsupported 4  Sitting with Back Unsupported but Feet Supported on Floor or Stool 4  Stand to Sit 4  Transfers 4  Standing Unsupported with Eyes Closed 4  Standing Unsupported with Feet Together 4  From Standing, Reach Forward with Outstretched Arm 3  From Standing Position, Pick up Object from Floor 4  From Standing Position, Turn to Look Behind Over each Shoulder 4  Turn 360 Degrees 2 (4.65 to L, 4.75 to R)  Standing Unsupported, Alternately Place Feet on Step/Stool 1  Standing Unsupported, One Foot in Front 3  Standing on One Leg 1 (able to lift on LLE)  Total Score 46  Berg comment: 46/56  Timed Up and Go Test  Normal TUG (seconds) 16.75  TUG Comments with SPC - needed to hear repeat instructions 3 times before performing assessment correctly     [facility] Adult PT Treatment/Exercise - 07/19/19 1127      Therapeutic  Activites    Therapeutic Activities  Other Therapeutic Activities    Other Therapeutic Activities  Pt inquiring about aquatic therapy. Discussed with pt that primary PT has talked to therapists who perform aquatic therapy (including OT that has worked with pt before) and stated that pt has already received extensive aquatic therapy and has been provided with an exercise program to continue for home with family support (but had prior transportation issues before). Pt verbalized understanding and asking about what pool that she can join.  Provided pt with the address and phone number for the [facility] (where pt had previously received aquatic therapy). Pt then asking about joining a gym such as the [employer] to use equipment such as the treadmill for aerobic exercise. Therapist stating that the treadmill would not be safe, but pt could potentially use recumbent bike/stepper with BLE only (not use hemiplegic RUE) - pt verbalized understanding. Trialed Nustep today during today's session.       Exercises   Exercises  Other Exercises    Other Exercises   NuStep with BLE only at gear 3 x6 minutes to trial use of a recumbent bike/stepper for BLE strengthening/endurance/ROM.              PT Education - 07/19/19 1218    Education Details  progress towards goals, see TA.    Person(s) Educated  Patient    Methods  Explanation    Comprehension  Verbalized understanding;Need further instruction       PT Short Term Goals - 07/19/19 1127      PT SHORT TERM GOAL #1   Title  The patient will be able to perform HEP mod indep for LE strength (ankle isolated control, hamstring, hip extensors). ALL STGS DUE 07/18/19    Baseline  pt reporting performing HEP    Time  7   7 weeks due to delay in scheduling.   Period  Weeks    Status  Achieved    Target Date  07/18/19      PT SHORT TERM GOAL #2   Title  Patient will improve BERG balance score to at leas a 44/56 in order to demo improved balance.     Baseline  46/56 on 07/19/19    Time  7    Period  Weeks    Status  Achieved      PT SHORT TERM GOAL #3   Title  Patient will ambulate at least 500' outdoors with mod I in order to demo improved endurance for functional gait.    Baseline  unable to assess today due to weather.    Time  7    Period  Weeks    Status  Deferred      PT SHORT TERM GOAL #4   Title  Patient will decrease TUG to 16 seconds or less with SPC in order to demo decr fall risk.    Baseline  16.75 seconds on 07/19/19    Status  Not Met        PT Long Term Goals - 05/30/19 1838      PT LONG TERM GOAL #1   Title  The patient will negotiate a curb mod indep. ALL LTGS DUE 08/08/19    Time  10   10 weeks due to delay in scheduling   Period  Weeks    Target Date  08/08/19      PT LONG TERM GOAL #2   Title  The patient will move floor<>stand with mod indep using L UE support on surface.    Baseline  not yet assessed.    Time  10    Period  Weeks    Status  New      PT LONG TERM GOAL #3   Title  The patient will improve gait speed without device from 1.33 ft/sec to > or equal to 1.8 ft/sec to demo improving balance/stability without a device.    Baseline  1.33 ft/sec no AD    Status  New      PT LONG TERM GOAL #4   Title  Patient will improve BERG balance score to a 47/56 in order to demo decr fall risk.    Baseline  42/56    Time  10    Period  Weeks    Status  New      PT LONG TERM GOAL #5   Title  Patient will be independent with final HEP in order to build upon functional gains made in therapy.    Time  10    Period  Weeks    Status  New      Additional Long Term Goals   Additional Long Term Goals  Yes      PT LONG TERM GOAL #6   Title  6MWT goal as appropriate to measure improved endurance for gait.    Baseline  not yet assessed.    Time  10    Period  Weeks    Status  New            Plan - 07/19/19 1512    Clinical Impression Statement  Assessed pt's STGs this session, pt has  met 2 out of 4 STGs. Pt has improved BERG score to a 46/56 (was previously a 42/56) and reports performing her HEP independently. Deferred outdoor goal due to weather conditions. Pt performed the TUG today with SPC in 16.75 seconds, indicating a higher fall risk. Pt inquiring about joining a gym for aerobic exercise and trialed using the NuStep today with using BLE only. Remainder of session focused on sit <> stand training with focus on eccentric control and weight shifting to RLE. Will continue to progress towards LTGs.    Personal Factors and Comorbidities  Comorbidity 3+;Past/Current Experience;Time since onset of injury/illness/exacerbation    Comorbidities  graves disease, HLD, HTN    Examination-Activity Limitations  Stairs;Transfers;Locomotion Level    Examination-Participation Restrictions  Community Activity    Stability/Clinical Decision Making  Stable/Uncomplicated    Rehab Potential  Good    PT Frequency  2x /  week    PT Duration  6 weeks    PT Treatment/Interventions  ADLs/Self Care Home Management;Aquatic Therapy;Electrical Stimulation;Therapeutic exercise;Therapeutic activities;Functional mobility training;Stair training;Gait training;DME Instruction;Balance training;Neuromuscular re-education;Patient/family education;Orthotic Fit/Training;Energy conservation;Passive range of motion    PT Next Visit Plan  did pt make appointment with [facility]? add to HEP as appropriate. standing balance, need practice with curb and floor <> mat transfers, tall kneeling, weight shifting to R as well as R hip flexion.    PT Home Exercise Plan  [id number]    Consulted and Agree with Plan of Care  Patient       Patient will benefit from skilled therapeutic intervention in order to improve the following deficits and impairments:  Abnormal gait, Decreased balance, Decreased activity tolerance, Decreased coordination, Decreased endurance, Decreased range of motion, Difficulty walking, Decreased strength,  Impaired tone, Pain  Visit Diagnosis: Unsteadiness on feet  Muscle weakness (generalized)  Other abnormalities of gait and mobility  Other lack of coordination     Problem List Patient Active Problem List   Diagnosis Date Noted  . Hematuria 05/31/2019  . Encounter for chronic pain management 01/22/2019  . Right spastic hemiplegia ([place]) 11/27/2018  . Elevated LFTs 11/20/2018  . 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 Unintentional weight loss 11/05/2018  . Fatigue 11/05/2018  . Bloody stool 11/05/2018  . Polyarthralgia 11/05/2018  . Chronic anticoagulation 11/05/2018  . Difficulty coping with disease 03/31/2018  . Hemiparesis of right dominant side as late effect of cerebral infarction ([place]) 09/17/2017  . Cardioembolic stroke ([place]) 62/70/3500  . 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 Cardiomyopathy ([place]) 09/17/2017  . H/O ischemic left MCA stroke 09/17/2017  . Cardiac LV ejection fraction 21-30%   . Graves disease   . Plaque psoriasis   . Neuropathy 05/19/2017    Arliss Journey, PT, DPT  07/19/2019, 3:13 PM  [facility] [address] [place], [place], [address] Phone: [phone]   Fax:  [phone]  Name: Margaret Ortiz MRN: [id number] Date of Birth: [birth year]

## 2019-07-19 NOTE — Telephone Encounter (Signed)
Message left from [phone], female name was difficult to understand.

## 2019-07-19 NOTE — Telephone Encounter (Signed)
Left message stating I had receive a call from this phone number and the female's name was difficult to understand, and if they would call again with the contact number, date of birth and spelling of the nail I would try to help.

## 2019-07-21 ENCOUNTER — Ambulatory Visit: Payer: BLUE CROSS/BLUE SHIELD | Admitting: Occupational Therapy

## 2019-07-21 ENCOUNTER — Other Ambulatory Visit: Payer: Self-pay

## 2019-07-21 DIAGNOSIS — G8929 Other chronic pain: Secondary | ICD-10-CM

## 2019-07-21 DIAGNOSIS — I69351 Hemiplegia and hemiparesis following cerebral infarction affecting right dominant side: Secondary | ICD-10-CM

## 2019-07-21 DIAGNOSIS — M25511 Pain in right shoulder: Secondary | ICD-10-CM

## 2019-07-21 DIAGNOSIS — R29818 Other symptoms and signs involving the nervous system: Secondary | ICD-10-CM

## 2019-07-21 DIAGNOSIS — M6281 Muscle weakness (generalized): Secondary | ICD-10-CM

## 2019-07-21 DIAGNOSIS — I69318 Other symptoms and signs involving cognitive functions following cerebral infarction: Secondary | ICD-10-CM

## 2019-07-21 DIAGNOSIS — R2689 Other abnormalities of gait and mobility: Secondary | ICD-10-CM

## 2019-07-21 DIAGNOSIS — R278 Other lack of coordination: Secondary | ICD-10-CM

## 2019-07-22 ENCOUNTER — Other Ambulatory Visit: Payer: Self-pay

## 2019-07-22 ENCOUNTER — Ambulatory Visit (INDEPENDENT_AMBULATORY_CARE_PROVIDER_SITE_OTHER): Payer: BLUE CROSS/BLUE SHIELD | Admitting: Podiatry

## 2019-07-22 DIAGNOSIS — M21371 Foot drop, right foot: Secondary | ICD-10-CM

## 2019-07-22 DIAGNOSIS — I69351 Hemiplegia and hemiparesis following cerebral infarction affecting right dominant side: Secondary | ICD-10-CM

## 2019-07-22 DIAGNOSIS — B351 Tinea unguium: Secondary | ICD-10-CM | POA: Diagnosis not present

## 2019-07-22 DIAGNOSIS — M79676 Pain in unspecified toe(s): Secondary | ICD-10-CM

## 2019-07-22 DIAGNOSIS — M79609 Pain in unspecified limb: Secondary | ICD-10-CM

## 2019-07-22 NOTE — Progress Notes (Signed)
  Subjective:  Patient ID: Margaret Ortiz, female    DOB: Mar 28, 1961,  MRN: [id number]  Chief Complaint  Patient presents with  . Foot Problem    Pt states bilateral feet have instability, wants to discuss updated braces and shoes.   59 y.o. female presents with the above complaint. History confirmed with patient.   Objective:  Physical Exam: warm, good capillary refill, no trophic changes or ulcerative lesions, normal DP and PT pulses and normal sensory exam. Left Foot: normal exam, no swelling, tenderness, instability; ligaments intact, full range of motion of all ankle/foot joints  Right Foot: absent dorsiflexion, inversion, eversion. Only active plantarflexion at 3/5   Assessment:   1. Right foot drop   2. Pain due to onychomycosis of nail   3. Hemiparesis of right dominant side as late effect of cerebral infarction [facility])    Plan:  Patient was evaluated and treated and all questions answered.  Dropfoot Right Foot, Right Ankle Instability -I do think her condition has changed. She has no inversion or eversion and would benefit from a brace that provides more frontal plane stability.  -Patient was shown several bracing options, she ended up deciding on new shoes. She was measured for these today with the assistance of Rick Puckett  Onychomycosis with Pain -Nails debrided x10  Return if symptoms worsen or fail to improve.

## 2019-07-22 NOTE — Therapy (Signed)
[facility] [address] [place], [place], [address] Phone: [phone]   Fax:  [phone]  Occupational Therapy Evaluation  Patient Details  Name: Margaret Ortiz MRN: [id number] Date of Birth: [birth year] Referring Provider (OT): Dr. Metta Clines   Encounter Date: 07/21/2019  OT End of Session - 07/22/19 1033    Visit Number  1    Number of Visits  11    Date for OT Re-Evaluation  08/28/19    Authorization Type  [employer]    OT Start Time  1320    OT Stop Time  1350    OT Time Calculation (min)  30 min    Activity Tolerance  Patient tolerated treatment well    Behavior During Therapy  [facility] for tasks assessed/performed       Past Medical History:  Diagnosis Date  . Aphasia 09/17/2017  . 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  . Cardiac LV ejection fraction 21-30%   . Cardioembolic stroke ([place]) [id number]   Right hemiparesis  . Cardiomyopathy ([place]) 04/2017  . Dysphagia 05/19/2017   Last known diet upgraded to regular diet with thin liquids on 06/27/2017, no records available  . 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 Fall 09/17/2017  . Graves disease   . H/O ischemic left MCA stroke 09/17/2017  . Hemiparesis of right dominant side as late effect of cerebral infarction ([place]) 09/17/2017  . Hyperlipidemia   . Hypertension   . Neuropathy 05/19/2017   s/p stroke  . Plaque psoriasis   . Shoulder subluxation, right, initial encounter 09/17/2017  . Weakness 09/17/2017    Past Surgical History:  Procedure Laterality Date  . NO PAST SURGERIES      There were no vitals filed for this visit.  Subjective Assessment - 07/21/19 1323    Subjective   Pt reports right shoulger pain    Patient Stated Goals  decrease shoulder pain    Currently in Pain?  Yes    Pain Score  7     Pain Location  Shoulder    Pain Orientation   Right    Pain Descriptors / Indicators  Aching    Pain Type  Chronic pain    Pain Onset  More than a month ago    Pain Frequency  Intermittent    Aggravating Factors   malpositioning    Pain Relieving Factors  repositioning        [facility] OT Assessment - 07/22/19 0001      Assessment   Medical Diagnosis  L MCA stroke    Onset Date/Surgical Date  05/19/17    Hand Dominance  Right    Prior Therapy  extensive therapy at this location       Precautions   Precautions  Fall      Balance Screen   Has the patient fallen in the past 6 months  No      Home  Environment   Family/patient expects to be discharged to:  Private residence    Lives With  Family   sister and brother in law      Prior Function   Level of Independence  Independent    Vocation Requirements  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    Leisure  walking      ADL   Eating/Feeding  Modified independent    Grooming  Modified independent  Upper Body Bathing  Modified independent    Lower Body Bathing  Modified independent    Upper Body Dressing  Independent    Lower Body Dressing  Independent    Toilet Transfer  Modified independent    Tub/Shower Transfer  Modified independent      IADL   Prior Level of Function Light Housekeeping  pt does laundry    Prior Level of Function Meal Prep  did not cook previously, per previous OT notes, pt able to perform snack prep    Meal Prep  --   makes own cold breakfast   Community Mobility  Relies on family or friends for transportation      Mobility   Mobility Status  History of falls    Mobility Status Comments  Ambulates with single point cane, but does not use all the time at home.  Also has RLE brace      Vision - History   Baseline Vision  Wears glasses all the time      Vision Assessment   Vision Assessment  Vision not tested      Cognition   Overall Cognitive Status  Impaired/Different from baseline    Area of Impairment  Awareness;Safety/judgement     Safety/Judgement  Decreased awareness of safety;Decreased awareness of deficits    Safety and Judgement Comments  decr safety, decr awareness/understanding of how to address deficits appropriately/decr risk for injury/complications and promote normal/functional movement patterns despite previous extensive therapy    Behaviors  --   aphasic     Sensation   Light Touch  Appears Intact      Coordination   Gross Motor Movements are Fluid and Coordinated  No    Fine Motor Movements are Fluid and Coordinated  No    Coordination  impaired only trace finger movement      Tone   Assessment Location  Right Upper Extremity      ROM / Strength   AROM / PROM / Strength  AROM;PROM      AROM   Overall AROM Comments  Limited RUE movement. Only able to initiate movement in flexor synergy pattern with shoulder abduction (50%)/IR, elbow flex, finger flex (approx 25%) with shoulder elevation, R shoulder subluxation      PROM   Overall PROM Comments  decreased passive shoulder flexion limited to grossly 85* due to pain and spasticity      RUE Tone   RUE Tone  Moderate           Pt was shown simple table slides for shoulder flexion and passive forearm supination/ pronation to perform at home. She returned demo.                OT Long Term Goals - 07/22/19 1038      OT LONG TERM GOAL #1   Title  Pt will perform simple home maintenance tasks mod I incorporating RUE as stabilizer or using hand-over-hand techniques when able.    Time  5    Period  Weeks    Status  New    Target Date  08/28/19      OT LONG TERM GOAL #2   Title  Pt will report R shoulder pain less than or equal to 3/10 for ADLs.    Time  5    Period  Weeks    Status  New   5/10     OT LONG TERM GOAL #3   Title  I with updated HEP.  Time  5    Period  Weeks    Status  New      OT LONG TERM GOAL #4   Title  Pt will verbalize/ demonstrate understanding of positioning to minimize pain and risk for RUE  injury.    Time  5    Period  Weeks    Status  New            Plan - 07/22/19 1034    Clinical Impression Statement  Pt is a 59 y.o. female referred to occupational therapy for R hemiparesis as late effect of CVA.  Pt s/p L CVA 05/19/2017 and completed extensive outpatient rehab at this clinic with occupational therapy.   Pt returns to therapy with new R shoulder pain.  Pt also with goals for updated HEP and improved RUE functional use.  Pt with PMH that includes: Graves Disease, aphasia, HTN, hyperlipidemia, cardiomyopathy, R shoulder subluxation, decr sensation RUE, visuospatial deficits.  Pt presents today with R hemiparesis,with decr AROM, decr strength, decr coordination, spasticity pattern, decr dominant RUE functional use, and decr ADL/IADL performance.  Pt will benefit from occupational therapy to address these deficits for pain reduction, incr participation in ADLs/IADLs, and improved dominant RUE functional use.  Pt was independent prior to CVA and living in [place], [place].    OT Occupational Profile and History  Problem Focused Assessment - Including review of records relating to presenting problem    Occupational Profile and client history currently impacting functional performance  Pt was independent prior to CVA and living in [place], [place].  Pt currently living with sister/brother-in-law unable to work, drive, or live alone safely.    Occupational performance deficits (Please refer to evaluation for details):  ADL's;IADL's;Leisure;Work;Social Participation    Body Structure / Function / Physical Skills  ADL;Balance;Body mechanics;Pain;ROM;UE functional use;Tone;IADL;GMC;FMC;Coordination;Sensation;Strength;Dexterity;Decreased knowledge of precautions    Rehab Potential  Fair    Clinical Decision Making  Limited treatment options, no task modification necessary    Comorbidities Affecting Occupational Performance:  May have comorbidities impacting occupational performance     Modification or Assistance to Complete Evaluation   No modification of tasks or assist necessary to complete eval    OT Frequency  2x / week    OT Duration  --   5 weeks plus eval   OT Treatment/Interventions  Self-care/ADL training;Electrical Stimulation;Therapeutic exercise;Patient/family education;Splinting;Neuromuscular education;Paraffin;Moist Heat;Fluidtherapy;Energy conservation;Therapist, nutritional;Therapeutic activities;Cryotherapy;Ultrasound;DME and/or AE instruction;Manual Therapy;Passive range of motion;Cognitive remediation/compensation;Balance training;Aquatic Therapy    Plan  initiate HEP, pain reduction strategies    Consulted and Agree with Plan of Care  Patient       Patient will benefit from skilled therapeutic intervention in order to improve the following deficits and impairments:   Body Structure / Function / Physical Skills: ADL, Balance, Body mechanics, Pain, ROM, UE functional use, Tone, IADL, GMC, FMC, Coordination, Sensation, Strength, Dexterity, Decreased knowledge of precautions       Visit Diagnosis: Hemiparesis of right dominant side as late effect of cerebral infarction ([place]) - Plan: Ot plan of care cert/re-cert  Muscle weakness (generalized) - Plan: Ot plan of care cert/re-cert  Other lack of coordination - Plan: Ot plan of care cert/re-cert  Other symptoms and signs involving the nervous system - Plan: Ot plan of care cert/re-cert  Chronic right shoulder pain - Plan: Ot plan of care cert/re-cert  Other abnormalities of gait and mobility - Plan: Ot plan of care cert/re-cert  Other symptoms and signs involving cognitive functions following cerebral infarction - Plan:  Ot plan of care cert/re-cert    Problem List Patient Active Problem List   Diagnosis Date Noted  . Hematuria 05/31/2019  . Encounter for chronic pain management 01/22/2019  . Right spastic hemiplegia ([place]) 11/27/2018  . Elevated LFTs 11/20/2018  . 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 Unintentional weight loss 11/05/2018  . Fatigue 11/05/2018  . Bloody stool 11/05/2018  . Polyarthralgia 11/05/2018  . Chronic anticoagulation 11/05/2018  . Difficulty coping with disease 03/31/2018  . Hemiparesis of right dominant side as late effect of cerebral infarction ([place]) 09/17/2017  . Cardioembolic stroke ([place]) [id number]  . 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 Cardiomyopathy ([place]) 09/17/2017  . H/O ischemic left MCA stroke 09/17/2017  . Cardiac LV ejection fraction 21-30%   . Graves disease   . Plaque psoriasis   . Neuropathy 05/19/2017    Zniyah Midkiff 07/22/2019, 10:48 AM Theone Murdoch, OTR/L [phone] Phone: [phone] 10:48 AM 07/22/19 [facility] [address] [place], [place], [address] Phone: [phone]   Fax:  [phone]  Name: Margaret Ortiz MRN: [id number] Date of Birth: 10-Feb-1961

## 2019-07-23 ENCOUNTER — Encounter: Payer: Self-pay | Admitting: Family Medicine

## 2019-07-23 ENCOUNTER — Ambulatory Visit: Payer: BLUE CROSS/BLUE SHIELD | Admitting: Physical Therapy

## 2019-07-23 DIAGNOSIS — R278 Other lack of coordination: Secondary | ICD-10-CM

## 2019-07-23 DIAGNOSIS — M6281 Muscle weakness (generalized): Secondary | ICD-10-CM

## 2019-07-23 DIAGNOSIS — I69351 Hemiplegia and hemiparesis following cerebral infarction affecting right dominant side: Secondary | ICD-10-CM

## 2019-07-23 DIAGNOSIS — R29818 Other symptoms and signs involving the nervous system: Secondary | ICD-10-CM

## 2019-07-23 NOTE — Progress Notes (Signed)
NEUROLOGY FOLLOW UP OFFICE NOTE  Erryn Kirchmann [id number]  HISTORY OF PRESENT ILLNESS: Margaret Ortiz is a 59 year old right-handed female with Grave's disease, plaque psoriasis, cardiomyopathy and residual right sided weakness, aphasia and dysarthriasecondary to left MCA stroke whofollows up for CVA.  PCP, PT/OT and PMR notes reviewed.  UPDATE: Current medications: Eliquis, atorvastatin80mg , Coreg 3.125mg  twice daily, gabapentin 600mg  twice daily.  She stopped tramadol because she said it was causing headache.      On 02/18/19, she developed right facial numbness, as well as bilateral occipital headache.  Headache comes and goes and responds to tramadol.  She says she thinks her speech is worse.  MRI of brain without contrast on 03/29/2019 was personally reviewed and demonstrated chronic left MCA territory infarct involving the insula, frontal, temporal and parietal lobes and subcortical white matter but no acute changes.  It was felt that she no longer needs botox injection.  LDL from 03/03/2019 was 99.  HISTORY: She was admitted to [facility] 05/19/17 for left MCA stroke. She was found to have a thrombus on imaging but was determined not to be a candidate for intervention due to completed stroke. She was diagnosed with cardiomyopathy as echocardiogram demonstrated a LV EF of 15-20%. Other testing is not available to me at this time. Repeat echocardiogram on 11/30 showed improved EF of 20-25%. It was believed that her stroke was likely cardioembolic secondary to undiagnosed cardiomyopathy. She was started on Coumadin which was later changed to Eliquis.  She subsequently developed significant dysphagia. MBS on 06/27/17 indicated that her diet could be upgraded to regular diet with thin liquids. She was admitted to inpatient rehab until March 2019. She has since moved to [place] to [place] her sister   Echocardiogram from 02/03/18 showed EF 25-30% with  severe global LV systolic dysfunction, moderate diastolic dysfunction, mild MR and mild LAE.   Past medications:  Coumadin, baclofen ineffective.  PAST MEDICAL HISTORY: Past Medical History:  Diagnosis Date  . Aphasia 09/17/2017  . 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  . Cardiac LV ejection fraction 21-30%   . Cardioembolic stroke ([place]) [id number]   Right hemiparesis  . Cardiomyopathy ([place]) 04/2017  . Dysphagia 05/19/2017   Last known diet upgraded to regular diet with thin liquids on 06/27/2017, no records available  . 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 Fall 09/17/2017  . Graves disease   . H/O ischemic left MCA stroke 09/17/2017  . Hemiparesis of right dominant side as late effect of cerebral infarction ([place]) 09/17/2017  . Hyperlipidemia   . Hypertension   . Neuropathy 05/19/2017   s/p stroke  . Plaque psoriasis   . Shoulder subluxation, right, initial encounter 09/17/2017  . Weakness 09/17/2017    MEDICATIONS: Current Outpatient Medications on File Prior to Visit  Medication Sig Dispense Refill  . apixaban (ELIQUIS) 5 MG TABS tablet Take 1 tablet (5 mg total) by mouth 2 (two) times daily. 180 tablet 1  . atorvastatin (LIPITOR) 80 MG tablet Take 1 tablet (80 mg total) by mouth daily. 90 tablet 2  . carvedilol (COREG) 3.125 MG tablet Take 1 tablet (3.125 mg total) by mouth 2 (two) times daily. 180 tablet 2  . clobetasol cream (TEMOVATE) [id number] % Apply 1 application topically 2 (two) times daily. 30 g 5  . esomeprazole (NEXIUM) 40 MG capsule Take 1 capsule (40 mg total) by mouth  daily at 12 noon. 90 capsule 1  . gabapentin (NEURONTIN) 300 MG capsule Take 2 capsules in the am and 2 capsules in the pm 340 capsule 1  . OVER THE COUNTER MEDICATION CBD Oil    . traMADol (ULTRAM) 50 MG tablet Take 1 tablet (50 mg total) by mouth every 12 (twelve) hours as needed. 60 tablet 5     No current facility-administered medications on file prior to visit.    ALLERGIES: Allergies  Allergen Reactions  . Dust Mite Extract   . Penicillins     FAMILY HISTORY: Family History  Problem Relation Age of Onset  . Hyperlipidemia Mother   . Hypertension Mother   . Early death Father   . Hypertension Sister   . Heart attack Brother   . Mental illness Brother    SOCIAL HISTORY: Social History   Socioeconomic History  . Marital status: Divorced    Spouse name: Not on file  . Number of children: 1  . Years of education: 69  . Highest education level: Professional school degree (e.g., MD, DDS, DVM, JD)  Occupational History  . Occupation: Community education officer - [employer]  Tobacco Use  . Smoking status: Former Research scientist (life sciences)  . Smokeless tobacco: Never Used  Substance and Sexual Activity  . Alcohol use: Never  . Drug use: Never  . Sexual activity: Not Currently  Other Topics Concern  . Not on file  Social History Narrative   Divorced.  From [place], moved to [place] to stay with her family after having a stroke March 2019   Graduate degree, works as a Community education officer.   Former smoker.   Exercise routinely prior to stroke.   Wears a hearing aid.   Smoke alarms in the home.   Wears her seatbelt.      Patient is right-handed. She drinks two cup of coffee a day. She now resides with her sister and brother-in-law.   Social Determinants of Health   Financial Resource Strain:   . Difficulty of Paying Living Expenses: Not on file  Food Insecurity:   . Worried About Charity fundraiser in the Last Year: Not on file  . Ran Out of Food in the Last Year: Not on file  Transportation Needs:   . Lack of Transportation (Medical): Not on file  . Lack of Transportation (Non-Medical): Not on file  Physical Activity:   . Days of Exercise per Week: Not on file  . Minutes of Exercise per Session: Not on file  Stress:   . Feeling of Stress : Not on file  Social Connections:   . Frequency  of Communication with Friends and Family: Not on file  . Frequency of Social Gatherings with Friends and Family: Not on file  . Attends Religious Services: Not on file  . Active Member of Clubs or Organizations: Not on file  . Attends Archivist Meetings: Not on file  . Marital Status: Not on file  Intimate Partner Violence:   . Fear of Current or Ex-Partner: Not on file  . Emotionally Abused: Not on file  . Physically Abused: Not on file  . Sexually Abused: Not on file    PHYSICAL EXAM: Blood pressure 105/71, pulse (!) 107, height 5\' 1"  (1.549 m), weight 121 lb (54.9 kg), SpO2 98 %. General: No acute distress.  Patient appears well-groomed.   Head:  Normocephalic/atraumatic Eyes:  Fundi examined but not visualized Neck: supple, no paraspinal tenderness, full range of motion Heart:  Regular rate and  rhythm Lungs:  Clear to auscultation bilaterally Back: No paraspinal tenderness Neurological Exam: alert and oriented to person, place, and time. Attention span and concentration intact, recent and remote memory intact, fund of knowledge intact.  Some disfluency of speech, difficulty repeating.  Slight right lower facial weakness.  Otherwise, CN II-XII intact. Bulk normal.  Increased right sided tone.  Right upper extremity plegic except muscle strength 1+/5 right finger flexion, right foot drop.  Otherwise, 5/5.  Sensation to pinprick and vibration intact.  Deep tendon reflexes 3+ right upper and lower extremities, 2+ left upper and lower extremities,.  Left finger to nose without dysmetria, unable to assess right.  Right hemiplegic gait with cane.    IMPRESSION: 1.  Right hemiparesis and expressive aphasia secondary to left MCA stroke, likely cardioembolic 2.  Cardiomyopathy  PLAN: 1.  Secondary stroke prevention as per PCP/cardiology:  Eliquis for secondary stroke prevention  Atorvastatin 80mg  (LDL goal less than 70)  Blood pressure control 2.  Gabapentin for neuropathic  pain 3.  Follow up in one year  Metta Clines, DO  CC: Howard Pouch, DO

## 2019-07-23 NOTE — Therapy (Signed)
[facility] [address] [place], [place], [address] Phone: [phone]   Fax:  [phone]  Physical Therapy Treatment  Patient Details  Name: Margaret Ortiz MRN: [id number] Date of Birth: Jun 27, 1960 Referring Provider (PT): Howard Pouch   Encounter Date: 07/23/2019  PT End of Session - 07/23/19 1212    Visit Number  6    Number of Visits  13    Date for PT Re-Evaluation  08/08/19   due to delay in scheduling   Authorization Type  BCBCS    PT Start Time  1103    PT Stop Time  1145    PT Time Calculation (min)  42 min    Activity Tolerance  Patient tolerated treatment well    Behavior During Therapy  [facility] for tasks assessed/performed       Past Medical History:  Diagnosis Date  . Aphasia 09/17/2017  . 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  . Cardiac LV ejection fraction 21-30%   . Cardioembolic stroke ([employer]) 33/29/5188   Right hemiparesis  . Cardiomyopathy ([place]) 04/2017  . Dysphagia 05/19/2017   Last known diet upgraded to regular diet with thin liquids on 06/27/2017, no records available  . 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 Fall 09/17/2017  . Graves disease   . H/O ischemic left MCA stroke 09/17/2017  . Hemiparesis of right dominant side as late effect of cerebral infarction ([place]) 09/17/2017  . Hyperlipidemia   . Hypertension   . Neuropathy 05/19/2017   s/p stroke  . Plaque psoriasis   . Shoulder subluxation, right, initial encounter 09/17/2017  . Weakness 09/17/2017    Past Surgical History:  Procedure Laterality Date  . NO PAST SURGERIES      There were no vitals filed for this visit.  Subjective Assessment - 07/23/19 1107    Subjective  Made an appointment with [facility] on the 15th. Saw the foot doctor yesterday and were talking about getting her a new shoe.    Pertinent History  PMH:  graves disease, HLD, HTN    How long can you walk comfortably?  walks everyday - 30 minutes in the morning and 30 minutes in the afternoon    Patient Stated Goals  wants to work on walking and resistance work, wants stability exercises. wants to know how to get up off the floor - reports she really needs to work on flexion and ABD    Currently in Pain?  Yes    Pain Score  6     Pain Location  --   all down R side   Pain Orientation  Right    Pain Descriptors / Indicators  Aching    Pain Relieving Factors  exercises.                       [facility] Adult PT Treatment/Exercise - 07/23/19 1217      Transfers   Transfers  Stand to Sit;Sit to Stand    Sit to Stand  --    Stand to Sit  --    Comments  2 x 10 reps sit <> stands at mat table with no UE support: with green floor bubble under LLE for increased weight shifting towards RLE, cues for hip ABD. Pt with incr difficulty with eccentric control when sittng, cues to perform slowly. loosened strap of AFO on R to  help with anterior translation of tibia and for additional DF ROM in closed chain       Neuro Re-ed    Neuro Re-ed Details   At edge of mat performed step taps with LLE for stance on RLE and prep for curb training onto 4" step - 2 x 10 reps with no UE support, cues for weight shifting towards RLE, pt tends to perform hurriedly at times. 4 reps of L placed on step and SLS on RLE with head motions (2 x 5 reps head turns, 2 x 5 reps head nods) cues for RLE hip extension activation. With single UE support on chair: with red theraband around distal thighs side steps to L for increased weight bearing and weight shifting towards RLE and hip abd strengthening, cues for technique 2 x 10 reps.       Exercises   Other Exercises   Step ups with LLE onto 4" step with no UE support x5 reps to prep for curb training and LLE strengthening, pt demonstrating incr RLE circumduction and hip hiking without use of UE support, min guard for safety        Knee/Hip Exercises: Stretches   Gastroc Stretch  Right;3 reps;30 seconds    Gastroc Stretch Limitations  at counter with brace off, initial cues for technique             PT Education - 07/23/19 1212    Education Details  continue with HEP    Person(s) Educated  Patient    Methods  Explanation    Comprehension  Verbalized understanding       PT Short Term Goals - 07/19/19 1127      PT SHORT TERM GOAL #1   Title  The patient will be able to perform HEP mod indep for LE strength (ankle isolated control, hamstring, hip extensors). ALL STGS DUE 07/18/19    Baseline  pt reporting performing HEP    Time  7   7 weeks due to delay in scheduling.   Period  Weeks    Status  Achieved    Target Date  07/18/19      PT SHORT TERM GOAL #2   Title  Patient will improve BERG balance score to at leas a 44/56 in order to demo improved balance.    Baseline  46/56 on 07/19/19    Time  7    Period  Weeks    Status  Achieved      PT SHORT TERM GOAL #3   Title  Patient will ambulate at least 500' outdoors with mod I in order to demo improved endurance for functional gait.    Baseline  unable to assess today due to weather.    Time  7    Period  Weeks    Status  Deferred      PT SHORT TERM GOAL #4   Title  Patient will decrease TUG to 16 seconds or less with SPC in order to demo decr fall risk.    Baseline  16.75 seconds on 07/19/19    Status  Not Met        PT Long Term Goals - 07/23/19 1216      PT LONG TERM GOAL #1   Title  The patient will negotiate a curb mod indep. ALL LTGS DUE 08/08/19    Time  10   10 weeks due to delay in scheduling   Period  Weeks      PT LONG TERM  GOAL #2   Title  The patient will move floor<>stand with mod indep using L UE support on surface.    Baseline  not yet assessed.    Time  10    Period  Weeks    Status  New      PT LONG TERM GOAL #3   Title  The patient will improve gait speed without device from 1.33 ft/sec to > or equal to 1.8  ft/sec to demo improving balance/stability without a device.    Baseline  1.33 ft/sec no AD    Status  New      PT LONG TERM GOAL #4   Title  Patient will improve BERG balance score to a 47/56 in order to demo decr fall risk.    Baseline  42/56    Time  10    Period  Weeks    Status  New      PT LONG TERM GOAL #5   Title  Patient will be independent with final HEP in order to build upon functional gains made in therapy.    Time  10    Period  Weeks    Status  New            Plan - 07/23/19 1213    Clinical Impression Statement  Focus of today's skilled session was RLE ROM and activities for weight shifting and weight bearing through RLE. Pt tolerated session well. When performing step ups with LLE onto 4" step to practice curb transfers with no UE support, pt demonstrating increased RLE circimduction and hiphiking to raise RLE onto step. Pt with difficulty with eccentric control with sit <> stands when weight is shifted to RLE. Will continue to progress towards LTGs.    Personal Factors and Comorbidities  Comorbidity 3+;Past/Current Experience;Time since onset of injury/illness/exacerbation    Comorbidities  graves disease, HLD, HTN    Examination-Activity Limitations  Stairs;Transfers;Locomotion Level    Examination-Participation Restrictions  Community Activity    Stability/Clinical Decision Making  Stable/Uncomplicated    Rehab Potential  Good    PT Frequency  2x / week    PT Duration  6 weeks    PT Treatment/Interventions  ADLs/Self Care Home Management;Aquatic Therapy;Electrical Stimulation;Therapeutic exercise;Therapeutic activities;Functional mobility training;Stair training;Gait training;DME Instruction;Balance training;Neuromuscular re-education;Patient/family education;Orthotic Fit/Training;Energy conservation;Passive range of motion    PT Next Visit Plan  progress curb training and step ups with LLE. add to HEP as appropriate. standing balance, need practice with curb  and floor <> mat transfers, tall kneeling and half kneeling activities, weight shifting to [place] and Agree with Plan of Care  Patient       Patient will benefit from skilled therapeutic intervention in order to improve the following deficits and impairments:  Abnormal gait, Decreased balance, Decreased activity tolerance, Decreased coordination, Decreased endurance, Decreased range of motion, Difficulty walking, Decreased strength, Impaired tone, Pain  Visit Diagnosis: Muscle weakness (generalized)  Other lack of coordination  Other symptoms and signs involving the nervous system  Hemiparesis of right dominant side as late effect of cerebral infarction [facility])     Problem List Patient Active Problem List   Diagnosis Date Noted  . Hematuria 05/31/2019  . Encounter for chronic pain management 01/22/2019  . Right spastic hemiplegia ([place]) 11/27/2018  . Elevated LFTs 11/20/2018  . 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 Unintentional weight loss 11/05/2018  .  Fatigue 11/05/2018  . Bloody stool 11/05/2018  . Polyarthralgia 11/05/2018  . Chronic anticoagulation 11/05/2018  . Difficulty coping with disease 03/31/2018  . Hemiparesis of right dominant side as late effect of cerebral infarction ([place]) 09/17/2017  . Cardioembolic stroke ([place]) 22/40/0180  . 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 Cardiomyopathy ([place]) 09/17/2017  . H/O ischemic left MCA stroke 09/17/2017  . Cardiac LV ejection fraction 21-30%   . Graves disease   . Plaque psoriasis   . Neuropathy 05/19/2017    Arliss Journey, PT, DPT  07/23/2019, 12:23 PM  [facility] [address] [place], [place], [address] Phone: [phone]   Fax:  [phone]  Name: Margaret Ortiz MRN: [id number] Date of Birth: [birth year]

## 2019-07-23 NOTE — Telephone Encounter (Signed)
Does patient need a visit to stop the tramadol? Please advise.

## 2019-07-25 NOTE — Progress Notes (Signed)
Cardiology Office Note   Date:  07/27/2019   ID:  Margaret Ortiz, Margaret Ortiz 09/01/1960, MRN [id number]  PCP:  Howard Pouch A, DO    No chief complaint on file.  Chronic systolic heart failure  Wt Readings from Last 3 Encounters:  07/27/19 121 lb (54.9 kg)  07/26/19 121 lb (54.9 kg)  07/02/19 120 lb (54.4 kg)       History of Present Illness: Margaret Ortiz is a 59 y.o. female  Who had a stroke in November 2018.  Per the records: "Patient was found down May 19, 2017 was found to be secondary to a left MCA stroke. Was felt to likely have been cardioembolic from undiagnosed cardiomyopathy since her ejection fraction was found to be 15-20%. Repeat echo on November 30, 2018showed improved ejection fraction of 20-25%. She suffered from dysphasia required an NG tubeintially. SHe was admitted to acute rehab December 4 to July 01, 2017. She was upgraded to a regular diet with thin liquids via MBS on June 27, 2017. Apparently ambulated with assistance at acute rehab. She had been following in the outpatient setting withvisits including cardiology and neurology appointments. Otherwise she stayed at a [facility] ([facility]) until 09/11/2017 when she was discharged and moved to [facility]. Cardiomyopathy, unspecified type (HCC)/Cardiac LV ejection fraction 21-30%"  Recent echocardiogram done in [place] in February 2019 showed ejection fraction 15 to 20% with mild to moderate mitral regurgitation.  Repeat echo in 01/2018 showed: Impressions:  - Severe global reduction in LV systolic function; moderate diastolic dysfunction; mild MR; mild LAE. Despite the low EF, she had been walking 30-40 minutes at a time, several times a day.She is always very interested in the LVEF and trying to get this higher.  Med titration has been limited by low BP.  She has typically been class I.  Persistent right sided weakness.  She was considering moving back to [place].  She used to work at [employer].     We have not used Entresto in the past since she has been class I and her blood pressure has been borderline low.   Past Medical History:  Diagnosis Date  . Aphasia 09/17/2017  . 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  . Cardiac LV ejection fraction 21-30%   . Cardioembolic stroke ([place]) [id number]   Right hemiparesis  . Cardiomyopathy ([place]) 04/2017  . Dysphagia 05/19/2017   Last known diet upgraded to regular diet with thin liquids on 06/27/2017, no records available  . 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 Fall 09/17/2017  . Graves disease   . H/O ischemic left MCA stroke 09/17/2017  . Hemiparesis of right dominant side as late effect of cerebral infarction ([place]) 09/17/2017  . Hyperlipidemia   . Hypertension   . Neuropathy 05/19/2017   s/p stroke  . Plaque psoriasis   . Shoulder subluxation, right, initial encounter 09/17/2017  . Weakness 09/17/2017    Past Surgical History:  Procedure Laterality Date  . NO PAST SURGERIES       Current Outpatient Medications  Medication Sig Dispense Refill  . Acetaminophen (TYLENOL PO) Take 1 tablet by mouth as needed.    Marland Kitchen apixaban (ELIQUIS) 5 MG TABS tablet Take 1 tablet (5 mg total) by mouth 2 (two) times daily. 180 tablet 1  . atorvastatin (LIPITOR) 80 MG tablet Take 1 tablet (80 mg total) by mouth daily. 90 tablet 2  .  carvedilol (COREG) 3.125 MG tablet Take 1 tablet (3.125 mg total) by mouth 2 (two) times daily. 180 tablet 2  . clobetasol cream (TEMOVATE) [id number] % Apply 1 application topically 2 (two) times daily. 30 g 5  . esomeprazole (NEXIUM) 40 MG capsule Take 1 capsule (40 mg total) by mouth daily at 12 noon. 90 capsule 1  . gabapentin (NEURONTIN) 300 MG capsule Take 2 capsules in the am and 2 capsules in the pm 340 capsule 1  . Ibuprofen (ADVIL PO) Take 1 tablet by mouth as needed.    Marland Kitchen OVER THE COUNTER MEDICATION CBD Oil     . traMADol (ULTRAM) 50 MG tablet Take 1 tablet (50 mg total) by mouth every 12 (twelve) hours as needed. 60 tablet 5   No current facility-administered medications for this visit.    Allergies:   Dust mite extract and Penicillins    Social History:  The patient  reports that she has quit smoking. She has never used smokeless tobacco. She reports that she does not drink alcohol or use drugs.   Family History:  The patient's family history includes Early death in her father; Heart attack in her brother; Hyperlipidemia in her mother; Hypertension in her mother and sister; Mental illness in her brother.    ROS:  Please see the history of present illness.   Otherwise, review of systems are positive for right arm and leg pain.   All other systems are reviewed and negative.    PHYSICAL EXAM: VS:  BP 106/72   Pulse 80   Ht 5\' 1"  (1.549 m)   Wt 121 lb (54.9 kg)   SpO2 97%   BMI 22.86 kg/m  , BMI Body mass index is 22.86 kg/m. GEN: Well nourished, well developed, in no acute distress  HEENT: normal  Neck: no JVD, carotid bruits, or masses Cardiac: RRR; no murmurs, rubs, or gallops,no edema  Respiratory:  clear to auscultation bilaterally, normal work of breathing GI: soft, nontender, nondistended, + BS MS: no deformity or atrophy  Skin: warm and dry, no rash Neuro:  Strength and sensation are intact Psych: euthymic mood, full affect   EKG:   The ekg ordered today demonstrates NSR, nonspecific ST changes   Recent Labs: 11/06/2018: TSH 1.39 03/03/2019: ALT 37 07/02/2019: BUN 14; Creat 0.89; Hemoglobin 13.9; Platelets 261; Potassium 4.4; Sodium 138   Lipid Panel    Component Value Date/Time   CHOL 182 03/03/2019 1058   TRIG 89 03/03/2019 1058   HDL 67 03/03/2019 1058   CHOLHDL 2.7 03/03/2019 1058   CHOLHDL 3 06/22/2018 1043   VLDL 11.2 06/22/2018 1043   LDLCALC 99 03/03/2019 1058     Other studies Reviewed: Additional studies/ records that were reviewed today with results  demonstrating: .   ASSESSMENT AND PLAN:  1. Chronic systolic heart failure: Borderline blood pressure limits medical therapy.  Continue regular exercise to maintain stamina.  She has not typically had heart failure exacerbation.  Continue Coreg.  2. Prior CVA: Residual right-sided weakness with speech impairment.  This is her biggest cause of disability. 3. Mitral regurgitation: Mild to moderate in the past.  No signs of congestive heart failure. 4. Anticoagulated: Eliquis for further stroke prevention.  No aspirin while on ELiquis.  No bleeding issues.    Current medicines are reviewed at length with the patient today.  The patient concerns regarding her medicines were addressed.  The following changes have been made:  No change  Labs/ tests ordered today include:  No orders of the defined types were placed in this encounter.   Recommend 150 minutes/week of aerobic exercise Low fat, low carb, high fiber diet recommended  Disposition:   FU in 7 months; she is requesting echo at that time   Signed, Larae Grooms, MD  07/27/2019 10:36 AM    [facility] [place], [place], [place]  [address] Phone: [phone]; Fax: [phone]

## 2019-07-26 ENCOUNTER — Encounter: Payer: Self-pay | Admitting: Neurology

## 2019-07-26 ENCOUNTER — Other Ambulatory Visit: Payer: Self-pay

## 2019-07-26 ENCOUNTER — Ambulatory Visit (INDEPENDENT_AMBULATORY_CARE_PROVIDER_SITE_OTHER): Payer: BLUE CROSS/BLUE SHIELD | Admitting: Neurology

## 2019-07-26 VITALS — BP 105/71 | HR 107 | Ht 61.0 in | Wt 121.0 lb

## 2019-07-26 DIAGNOSIS — I69320 Aphasia following cerebral infarction: Secondary | ICD-10-CM

## 2019-07-26 DIAGNOSIS — I69351 Hemiplegia and hemiparesis following cerebral infarction affecting right dominant side: Secondary | ICD-10-CM

## 2019-07-26 DIAGNOSIS — I429 Cardiomyopathy, unspecified: Secondary | ICD-10-CM | POA: Diagnosis not present

## 2019-07-26 NOTE — Patient Instructions (Signed)
1.  Continue current medications 2.  Follow up in one year

## 2019-07-27 ENCOUNTER — Encounter: Payer: Self-pay | Admitting: Interventional Cardiology

## 2019-07-27 ENCOUNTER — Ambulatory Visit (INDEPENDENT_AMBULATORY_CARE_PROVIDER_SITE_OTHER): Payer: BLUE CROSS/BLUE SHIELD | Admitting: Interventional Cardiology

## 2019-07-27 VITALS — BP 106/72 | HR 80 | Ht 61.0 in | Wt 121.0 lb

## 2019-07-27 DIAGNOSIS — I639 Cerebral infarction, unspecified: Secondary | ICD-10-CM | POA: Diagnosis not present

## 2019-07-27 DIAGNOSIS — I340 Nonrheumatic mitral (valve) insufficiency: Secondary | ICD-10-CM | POA: Diagnosis not present

## 2019-07-27 DIAGNOSIS — I69351 Hemiplegia and hemiparesis following cerebral infarction affecting right dominant side: Secondary | ICD-10-CM | POA: Diagnosis not present

## 2019-07-27 DIAGNOSIS — I5022 Chronic systolic (congestive) heart failure: Secondary | ICD-10-CM | POA: Diagnosis not present

## 2019-07-27 NOTE — Patient Instructions (Signed)
Medication Instructions:  Your physician recommends that you continue on your current medications as directed. Please refer to the Current Medication list given to you today.  *If you need a refill on your cardiac medications before your next appointment, please call your pharmacy*  Lab Work: None ordered  If you have labs (blood work) drawn today and your tests are completely normal, you will receive your results only by: Marland Kitchen MyChart Message (if you have MyChart) OR . A paper copy in the mail If you have any lab test that is abnormal or we need to change your treatment, we will call you to review the results.  Testing/Procedures: Your physician has requested that you have an echocardiogram in SEPTEMBER 2021. Echocardiography is a painless test that uses sound waves to create images of your heart. It provides your doctor with information about the size and shape of your heart and how well your heart's chambers and valves are working. This procedure takes approximately one hour. There are no restrictions for this procedure.   Follow-Up: At [facility], you and your health needs are our priority.  As part of our continuing mission to provide you with exceptional heart care, we have created designated Provider Care Teams.  These Care Teams include your primary Cardiologist (physician) and Advanced Practice Providers (APPs -  Physician Assistants and Nurse Practitioners) who all work together to provide you with the care you need, when you need it.  Your next appointment:   7 month(s)  The format for your next appointment:   In Person  Provider:   You may see Casandra Doffing, MD or one of the following Advanced Practice Providers on your designated Care Team:    Melina Copa, PA-C  Ermalinda Barrios, PA-C   Other Instructions

## 2019-07-28 ENCOUNTER — Other Ambulatory Visit: Payer: Self-pay

## 2019-07-28 ENCOUNTER — Encounter: Payer: Self-pay | Admitting: Family Medicine

## 2019-07-28 ENCOUNTER — Ambulatory Visit (INDEPENDENT_AMBULATORY_CARE_PROVIDER_SITE_OTHER): Payer: BLUE CROSS/BLUE SHIELD | Admitting: Family Medicine

## 2019-07-28 ENCOUNTER — Ambulatory Visit: Payer: BLUE CROSS/BLUE SHIELD | Attending: Family Medicine | Admitting: Occupational Therapy

## 2019-07-28 VITALS — Ht 61.0 in | Wt 121.0 lb

## 2019-07-28 DIAGNOSIS — M792 Neuralgia and neuritis, unspecified: Secondary | ICD-10-CM

## 2019-07-28 DIAGNOSIS — R2689 Other abnormalities of gait and mobility: Secondary | ICD-10-CM | POA: Diagnosis present

## 2019-07-28 DIAGNOSIS — R29818 Other symptoms and signs involving the nervous system: Secondary | ICD-10-CM

## 2019-07-28 DIAGNOSIS — I69318 Other symptoms and signs involving cognitive functions following cerebral infarction: Secondary | ICD-10-CM | POA: Insufficient documentation

## 2019-07-28 DIAGNOSIS — I69351 Hemiplegia and hemiparesis following cerebral infarction affecting right dominant side: Secondary | ICD-10-CM | POA: Diagnosis present

## 2019-07-28 DIAGNOSIS — G8929 Other chronic pain: Secondary | ICD-10-CM | POA: Diagnosis present

## 2019-07-28 DIAGNOSIS — M25511 Pain in right shoulder: Secondary | ICD-10-CM | POA: Diagnosis present

## 2019-07-28 DIAGNOSIS — I69398 Other sequelae of cerebral infarction: Secondary | ICD-10-CM | POA: Diagnosis not present

## 2019-07-28 DIAGNOSIS — R2681 Unsteadiness on feet: Secondary | ICD-10-CM | POA: Diagnosis present

## 2019-07-28 DIAGNOSIS — M6281 Muscle weakness (generalized): Secondary | ICD-10-CM | POA: Diagnosis present

## 2019-07-28 DIAGNOSIS — R278 Other lack of coordination: Secondary | ICD-10-CM

## 2019-07-28 MED ORDER — HYDROCODONE-ACETAMINOPHEN 5-325 MG PO TABS: 0.5000 | ORAL_TABLET | Freq: Two times a day (BID) | ORAL | 0 refills | Status: AC | PRN

## 2019-07-28 NOTE — Patient Instructions (Signed)
Follow-up in 1-2 weeks on chronic pain to see how you are doing on new medication. Remember do not take tramadol when taking new medication.   COVID-19 Vaccine Information can be found at: [email or web address].uk For questions related to vaccine distribution or appointments, please email [email or web address] or call [phone].  Covid Vaccine appointment go to [email or web address].

## 2019-07-28 NOTE — Progress Notes (Signed)
VIRTUAL VISIT VIA VIDEO  I connected with [name] on 07/28/19 at  3:00 PM EST by a video enabled telemedicine application and verified that I am speaking with the correct person using two identifiers. Location patient: Home Location provider: [facility], Office Persons participating in the virtual visit: Patient, Dr. Raoul Pitch and R.Baker, LPN  I discussed the limitations of evaluation and management by telemedicine and the availability of in person appointments. The patient expressed understanding and agreed to proceed.   SUBJECTIVE Chief Complaint  Patient presents with  . Pain    HPI: Margaret Ortiz is a 59 y.o.  female presents today to discuss her pain management.  Patient unfortunately has suffered from cerebrovascular accident with hemiparesis of right dominant side.  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  She is continually working with physical therapy and occupational therapy to increase her strength and endurance.  NSAIDs are contraindicated secondary to the use with her blood thinners.  She had been taking Tylenol at higher doses and as a consequence liver enzymes had elevated.  Tramadol 25-50 mg twice daily as needed has been used for pain and she has been taking this "only when needed ".  She has presented here a few times and asked for daily pain medicine and it has been explained to her she can take higher doses of NSAIDs or acetaminophen secondary to her chronic conditions.  She has been instructed to take the tramadol twice daily as needed for pain and she does not do so.  Today she states when she takes 50 mg of tramadol she had a headache and she got dizzy and wanted to try different pain medication.  Patient is prescribed gabapentin 600 mg twice daily.  Higher doses caused her sedation.  ROS: See pertinent positives and negatives per HPI.  Patient Active Problem List   Diagnosis Date Noted  . Hematuria 05/31/2019  . Encounter for chronic pain  management 01/22/2019  . Right spastic hemiplegia ([place]) 11/27/2018  . Elevated LFTs 11/20/2018  . 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 Unintentional weight loss 11/05/2018  . Fatigue 11/05/2018  . Bloody stool 11/05/2018  . Polyarthralgia 11/05/2018  . Chronic anticoagulation 11/05/2018  . Difficulty coping with disease 03/31/2018  . Hemiparesis of right dominant side as late effect of cerebral infarction ([place]) 09/17/2017  . Cardioembolic stroke ([place]) [id number]  . 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 Cardiomyopathy ([place]) 09/17/2017  . H/O ischemic left MCA stroke 09/17/2017  . Cardiac LV ejection fraction 21-30%   . Graves disease   . Plaque psoriasis   . Neuropathy 05/19/2017    Social History   Tobacco Use  . Smoking status: Former Research scientist (life sciences)  . Smokeless tobacco: Never Used  Substance Use Topics  . Alcohol use: Never    Current Outpatient Medications:  .  Acetaminophen (TYLENOL PO), Take 1 tablet by mouth as needed., Disp: , Rfl:  .  apixaban (ELIQUIS) 5 MG TABS tablet, Take 1 tablet (5 mg total) by mouth 2 (two) times daily., Disp: 180 tablet, Rfl: 1 .  atorvastatin (LIPITOR) 80 MG tablet, Take 1 tablet (80 mg total) by mouth daily., Disp: 90 tablet, Rfl: 2 .  carvedilol (COREG) 3.125 MG tablet, Take 1 tablet (3.125 mg total) by mouth 2 (two) times daily., Disp: 180 tablet, Rfl: 2 .  clobetasol cream (TEMOVATE) [id number] %, Apply 1 application topically 2 (two) times daily., Disp: 30 g,  Rfl: 5 .  esomeprazole (NEXIUM) 40 MG capsule, Take 1 capsule (40 mg total) by mouth daily at 12 noon., Disp: 90 capsule, Rfl: 1 .  gabapentin (NEURONTIN) 300 MG capsule, Take 2 capsules in the am and 2 capsules in the pm, Disp: 340 capsule, Rfl: 1 .  Ibuprofen (ADVIL PO), Take 1 tablet by mouth as needed., Disp: , Rfl:  .  OVER THE COUNTER MEDICATION, CBD Oil, Disp: , Rfl:  .  traMADol (ULTRAM) 50 MG tablet,  Take 1 tablet (50 mg total) by mouth every 12 (twelve) hours as needed., Disp: 60 tablet, Rfl: 5 .  HYDROcodone-acetaminophen (NORCO/VICODIN) 5-325 MG tablet, Take 0.5 tablets by mouth 2 (two) times daily as needed for up to 5 days for moderate pain., Disp: 5 tablet, Rfl: 0  Allergies  Allergen Reactions  . Dust Mite Extract   . Penicillins     OBJECTIVE: Ht 5\' 1"  (1.549 m)   Wt 121 lb (54.9 kg)   BMI 22.86 kg/m  Gen: No acute distress. Nontoxic in appearance.  HENT: AT. [place].  MMM.  Eyes:Pupils Equal Round Reactive to light, Extraocular movements intact,  Conjunctiva without redness, discharge or icterus. Neuro:  Alert. Oriented x3  Psych: Normal affect, dress and demeanor. Normal speech. Normal thought content and judgment.  ASSESSMENT AND PLAN: Margaret Ortiz is a 59 y.o. 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  Patient again was explained that secondary to her chronic conditions chronic NSAIDs (daily) and aspirin are contraindicated.  Therefore the next level of pain management would be tramadol-which she is prescribed.  Unfortunately now she is reporting dizziness and headache with tramadol twice daily as needed.  She is on gabapentin which cannot be increased any higher secondary to side effects. -DC tramadol.  Patient aware not to take tramadol with new medication.  If new medication is more effective will call and cancel tramadol at her mail-in pharmacy. -Start hydrocodone-acetaminophen 5-3 25 (half tab twice daily as needed).  Attempting to use lowest narcotic possible and lowest Tylenol dose possible. Continue gabapentin- can take 1-2 tab in morning if  desired and 2 tabs evening. She reports 2 tabs in the morning was causing SE.  -She has been prescribed baclofen low-dose -she declines to take the medication. Continue PT -[place] controlled substance database reviewed and appropriate 07/28/19 -NSAIDs contraindicated secondary to chronic anticoagulation -Higher doses of Tylenol contraindicated secondary to elevated liver enzymes with the use. Discussed with her today if pain is not controlled with regimen>> then would refer her back to orthopedic to see if they have anything further to offer her.  Could also consider trying Cymbalta at low dose. Follow-up in 1 to 2 weeks.     No orders of the defined types were placed in this encounter.  Meds ordered this encounter  Medications  . HYDROcodone-acetaminophen (NORCO/VICODIN) 5-325 MG tablet    Sig: Take 0.5 tablets by mouth 2 (two) times daily as needed for up to 5 days for moderate pain.    Dispense:  5 tablet    Refill:  [place], DO 07/28/2019

## 2019-07-28 NOTE — Therapy (Addendum)
[facility] [address] [place], [place], [address] Phone: [phone]   Fax:  [phone]  Occupational Therapy Treatment  Patient Details  Name: Margaret Ortiz MRN: [id number] Date of Birth: 06-Jun-1961 Referring Provider (OT): Dr. Metta Clines   Encounter Date: 07/28/2019  OT End of Session - 07/28/19 1225    Visit Number  2    Number of Visits  11    Date for OT Re-Evaluation  08/28/19    Authorization Type  [employer]    OT Start Time  1021    OT Stop Time  1100    OT Time Calculation (min)  39 min    Activity Tolerance  Patient tolerated treatment well    Behavior During Therapy  [facility] for tasks assessed/performed       Past Medical History:  Diagnosis Date  . Aphasia 09/17/2017  . 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  . Cardiac LV ejection fraction 21-30%   . Cardioembolic stroke ([place]) [id number]   Right hemiparesis  . Cardiomyopathy ([place]) 04/2017  . Dysphagia 05/19/2017   Last known diet upgraded to regular diet with thin liquids on 06/27/2017, no records available  . 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 Fall 09/17/2017  . Graves disease   . H/O ischemic left MCA stroke 09/17/2017  . Hemiparesis of right dominant side as late effect of cerebral infarction ([place]) 09/17/2017  . Hyperlipidemia   . Hypertension   . Neuropathy 05/19/2017   s/p stroke  . Plaque psoriasis   . Shoulder subluxation, right, initial encounter 09/17/2017  . Weakness 09/17/2017    Past Surgical History:  Procedure Laterality Date  . NO PAST SURGERIES      There were no vitals filed for this visit.  Subjective Assessment - 07/28/19 1224    Subjective   Pt reports right shoulger pain is better today    Patient Stated Goals  decrease shoulder pain    Currently in Pain?  Yes    Pain Score  4     Pain Location  Shoulder    Pain  Orientation  Right    Pain Descriptors / Indicators  Aching    Pain Type  Chronic pain    Pain Onset  More than a month ago    Pain Frequency  Intermittent    Aggravating Factors   malpositioning    Pain Relieving Factors  repositioning    Multiple Pain Sites  No             Treatment: Supine joint and soft tissue mobs to right shoulder followed by P/ROM shoulder flexion with therapist facilitating shoulder and scapular positioning Seated gentle self stretch reach for floor, min v.c Seated rolling physioball forwards and back for low range shoulder flexion and weightbearing with bilateral UE's. Seated slow range shoulder flexion and horizontal abduction with RUE supported on cane, assisting with LUe, min v.c for techniques. with                OT Short Term Goals - 12/02/18 1322      OT SHORT TERM GOAL #1   Title  Pt/family independent with updated HEP for RUE-- check STGs 08/28/18    Status  Achieved      OT SHORT TERM GOAL #2   Title  Pt will report R shoulder/neck pain less than or equal to 5/10 for functional  tasks.    Status  Achieved      OT SHORT TERM GOAL #3   Title  Pt will verbalize understanding of proper positioning of RUE to decr pain.    Status  Achieved        OT Long Term Goals - 07/22/19 1038      OT LONG TERM GOAL #1   Title  Pt will perform simple home maintenance tasks mod I incorporating RUE as stabilizer or using hand-over-hand techniques when able.    Time  5    Period  Weeks    Status  New    Target Date  08/28/19      OT LONG TERM GOAL #2   Title  Pt will report R shoulder pain less than or equal to 3/10 for ADLs.    Time  5    Period  Weeks    Status  New   5/10     OT LONG TERM GOAL #3   Title  I with updated HEP.    Time  5    Period  Weeks    Status  New      OT LONG TERM GOAL #4   Title  Pt will verbalize/ demonstrate understanding of positioning to minimize pain and risk for RUE injury.    Time  5    Period   Weeks    Status  New            Plan - 07/28/19 1753    Clinical Impression Statement  Pt is progressing towards goals. She reports no pain at end of session.    OT Occupational Profile and History  Problem Focused Assessment - Including review of records relating to presenting problem    Occupational Profile and client history currently impacting functional performance  Pt was independent prior to CVA and living in [place], [place].  Pt currently living with sister/brother-in-law unable to work, drive, or live alone safely.    Occupational performance deficits (Please refer to evaluation for details):  ADL's;IADL's;Leisure;Work;Social Participation    Body Structure / Function / Physical Skills  ADL;Balance;Body mechanics;Pain;ROM;UE functional use;Tone;IADL;GMC;FMC;Coordination;Sensation;Strength;Dexterity;Decreased knowledge of precautions    Rehab Potential  Fair    Clinical Decision Making  Limited treatment options, no task modification necessary    Comorbidities Affecting Occupational Performance:  May have comorbidities impacting occupational performance    Modification or Assistance to Complete Evaluation   No modification of tasks or assist necessary to complete eval    OT Frequency  2x / week    OT Duration  --   5 weeks plus eval   OT Treatment/Interventions  Self-care/ADL training;Electrical Stimulation;Therapeutic exercise;Patient/family education;Splinting;Neuromuscular education;Paraffin;Moist Heat;Fluidtherapy;Energy conservation;Therapist, nutritional;Therapeutic activities;Cryotherapy;Ultrasound;DME and/or AE instruction;Manual Therapy;Passive range of motion;Cognitive remediation/compensation;Balance training;Aquatic Therapy    Plan  new resting hand splint, pain reduction strategies,  work towards goals.    Consulted and Agree with Plan of Care  Patient       Patient will benefit from skilled therapeutic intervention in order to improve the following  deficits and impairments:   Body Structure / Function / Physical Skills: ADL, Balance, Body mechanics, Pain, ROM, UE functional use, Tone, IADL, GMC, FMC, Coordination, Sensation, Strength, Dexterity, Decreased knowledge of precautions       Visit Diagnosis: Muscle weakness (generalized)  Other lack of coordination  Other symptoms and signs involving the nervous system  Hemiparesis of right dominant side as late effect of cerebral infarction (HCC)  Chronic right shoulder pain  Problem List Patient Active Problem List   Diagnosis Date Noted  . Hematuria 05/31/2019  . Encounter for chronic pain management 01/22/2019  . Right spastic hemiplegia ([place]) 11/27/2018  . Elevated LFTs 11/20/2018  . 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 Unintentional weight loss 11/05/2018  . Fatigue 11/05/2018  . Bloody stool 11/05/2018  . Polyarthralgia 11/05/2018  . Chronic anticoagulation 11/05/2018  . Difficulty coping with disease 03/31/2018  . Hemiparesis of right dominant side as late effect of cerebral infarction ([place]) 09/17/2017  . Cardioembolic stroke ([place]) [id number]  . 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 Cardiomyopathy ([place]) 09/17/2017  . H/O ischemic left MCA stroke 09/17/2017  . Cardiac LV ejection fraction 21-30%   . Graves disease   . Plaque psoriasis   . Neuropathy 05/19/2017    Kalen Neidert 07/29/2019, 5:13 PM Theone Murdoch, OTR/L [phone] Phone: [phone] 5:13 PM 07/29/19 [facility] [address] [place], [place], [address] Phone: [phone]   Fax:  [phone]  Name: Margaret Ortiz MRN: [id number] Date of Birth: [birth year]

## 2019-07-30 ENCOUNTER — Ambulatory Visit: Payer: BLUE CROSS/BLUE SHIELD | Admitting: Physical Therapy

## 2019-07-30 ENCOUNTER — Other Ambulatory Visit: Payer: Self-pay

## 2019-07-30 ENCOUNTER — Encounter: Payer: Self-pay | Admitting: Physical Therapy

## 2019-07-30 DIAGNOSIS — R29818 Other symptoms and signs involving the nervous system: Secondary | ICD-10-CM

## 2019-07-30 DIAGNOSIS — M6281 Muscle weakness (generalized): Secondary | ICD-10-CM | POA: Diagnosis not present

## 2019-07-30 DIAGNOSIS — I69351 Hemiplegia and hemiparesis following cerebral infarction affecting right dominant side: Secondary | ICD-10-CM

## 2019-07-30 DIAGNOSIS — R278 Other lack of coordination: Secondary | ICD-10-CM

## 2019-07-30 NOTE — Therapy (Signed)
[facility] [address] [place], [place], [address] Phone: [phone]   Fax:  [phone]  Physical Therapy Treatment  Patient Details  Name: Margaret Ortiz MRN: [id number] Date of Birth: 1960/07/14 Referring Provider (PT): Howard Pouch   Encounter Date: 07/30/2019  PT End of Session - 07/30/19 1158    Visit Number  7    Number of Visits  13    Date for PT Re-Evaluation  08/08/19   due to delay in scheduling   Authorization Type  BCBCS    PT Start Time  1105    PT Stop Time  1148    PT Time Calculation (min)  43 min    Equipment Utilized During Treatment  Gait belt    Activity Tolerance  Patient tolerated treatment well    Behavior During Therapy  [facility] for tasks assessed/performed       Past Medical History:  Diagnosis Date  . Aphasia 09/17/2017  . 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  . Cardiac LV ejection fraction 21-30%   . Cardioembolic stroke ([place]) 22/29/7989   Right hemiparesis  . Cardiomyopathy ([place]) 04/2017  . Dysphagia 05/19/2017   Last known diet upgraded to regular diet with thin liquids on 06/27/2017, no records available  . 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 Fall 09/17/2017  . Graves disease   . H/O ischemic left MCA stroke 09/17/2017  . Hemiparesis of right dominant side as late effect of cerebral infarction ([place]) 09/17/2017  . Hyperlipidemia   . Hypertension   . Neuropathy 05/19/2017   s/p stroke  . Plaque psoriasis   . Shoulder subluxation, right, initial encounter 09/17/2017  . Weakness 09/17/2017    Past Surgical History:  Procedure Laterality Date  . NO PAST SURGERIES      There were no vitals filed for this visit.  Subjective Assessment - 07/30/19 1108    Subjective  Nothing is new from the last time. Has been walking outside with her cane. No falls. Saw Dr. Raoul Pitch the  other day and was prescribed HYDROcodone-acetaminophen because she has been dizzy from the tramadol. Hasn't picked it up yet.    Pertinent History  PMH: graves disease, HLD, HTN    How long can you walk comfortably?  walks everyday - 30 minutes in the morning and 30 minutes in the afternoon    Patient Stated Goals  wants to work on walking and resistance work, wants stability exercises. wants to know how to get up off the floor - reports she really needs to work on flexion and ABD    Currently in Pain?  No/denies                       [facility] Adult PT Treatment/Exercise - 07/30/19 1209      Transfers   Transfers  Stand to Sit;Sit to Stand    Sit to Stand  5: Supervision    Stand to Sit  5: Supervision    Stand to Sit Details (indicate cue type and reason)  Verbal cues for technique    Stand to Sit Details  cues for eccentric control when lowering     Comments  1 x 10 reps sit <> stands from mat table with no UE support - blue balance disc placed under LLE for increased weight shifting and strenghtnening towards RLE, cues to keep weight  shift towards R in standing .      Ambulation/Gait   Curb  4: Min assist;Other (comment)   close min guard   Curb Details (indicate cue type and reason)  x4 reps ascending and descending. pt with most difficulty ascending curb with LLE and SPC, pt needs cues for cane placement and to make sure that cane is steady on surface before performing step up, needed min A at one instance due to cane not fully being placed on the ground. close min guard for balance when shifting towards RLE       Therapeutic Activites    Therapeutic Activities  Other Therapeutic Activities    Other Therapeutic Activities   Curb training with [place] up onto long black 4" step 1 x 5 reps - cues for cane placement and making sure LLE is placed fully on step, followed by 5 reps descending first with RLE, progressed to practicing onto 6" step with continued cues for cane placement  ascending and descending step 1 x 10 reps, min guard x2 instances when ascending step.       Exercises   Exercises  Other Exercises    Other Exercises   Standing on outside of // bars: step ups with LLE onto 4" long black step to prep for curb training 2 x 8 reps - cues for weight shifting towards RLE. 1 x 10 reps weight shifting towards R and tapping LLE to 6" step - cues for "soft" step for controlled weight shifting as pt with tendency to hurriedly place LLE on and off step.           Balance Exercises - 07/30/19 1202      Balance Exercises: Standing   Standing Eyes Closed  Narrow base of support (BOS);Foam/compliant surface;3 reps;30 secs    SLS  Foam/compliant surface;Eyes open;Limitations    SLS Limitations  SLS on RLE on blue foam, step taps with LLE to 4" step for weight shifting and ankle balance strategy on R 1 x 10 reps    Other Standing Exercises  with strap loosened on AFO for additional closed chain ankle DF stretch, modified SLS - placing LLE on 4" step, cues for weight shifting, 2 x 5 reps head turns, 2 x 5 reps head nods           PT Short Term Goals - 07/19/19 1127      PT SHORT TERM GOAL #1   Title  The patient will be able to perform HEP mod indep for LE strength (ankle isolated control, hamstring, hip extensors). ALL STGS DUE 07/18/19    Baseline  pt reporting performing HEP    Time  7   7 weeks due to delay in scheduling.   Period  Weeks    Status  Achieved    Target Date  07/18/19      PT SHORT TERM GOAL #2   Title  Patient will improve BERG balance score to at leas a 44/56 in order to demo improved balance.    Baseline  46/56 on 07/19/19    Time  7    Period  Weeks    Status  Achieved      PT SHORT TERM GOAL #3   Title  Patient will ambulate at least 500' outdoors with mod I in order to demo improved endurance for functional gait.    Baseline  unable to assess today due to weather.    Time  7    Period  Weeks  Status  Deferred      PT SHORT  TERM GOAL #4   Title  Patient will decrease TUG to 16 seconds or less with SPC in order to demo decr fall risk.    Baseline  16.75 seconds on 07/19/19    Status  Not Met        PT Long Term Goals - 07/23/19 1216      PT LONG TERM GOAL #1   Title  The patient will negotiate a curb mod indep. ALL LTGS DUE 08/08/19    Time  10   10 weeks due to delay in scheduling   Period  Weeks      PT LONG TERM GOAL #2   Title  The patient will move floor<>stand with mod indep using L UE support on surface.    Baseline  not yet assessed.    Time  10    Period  Weeks    Status  New      PT LONG TERM GOAL #3   Title  The patient will improve gait speed without device from 1.33 ft/sec to > or equal to 1.8 ft/sec to demo improving balance/stability without a device.    Baseline  1.33 ft/sec no AD    Status  New      PT LONG TERM GOAL #4   Title  Patient will improve BERG balance score to a 47/56 in order to demo decr fall risk.    Baseline  42/56    Time  10    Period  Weeks    Status  New      PT LONG TERM GOAL #5   Title  Patient will be independent with final HEP in order to build upon functional gains made in therapy.    Time  10    Period  Weeks    Status  New            Plan - 07/30/19 1219    Clinical Impression Statement  Focus of today's skilled session focused on curb training and step ups, NMR towards RLE and RLE strengthening. Pt needing min guard and one episode of min A during curb training with SPC, has difficulty with RLE weight shift and needs cueing for proper cane placement. Remainder of session focused on activities with RLE weight shifting, needs cues for controlled weight shift towards R to tap LLE. Will continue to progress towards LTGs.    Personal Factors and Comorbidities  Comorbidity 3+;Past/Current Experience;Time since onset of injury/illness/exacerbation    Comorbidities  graves disease, HLD, HTN    Examination-Activity Limitations   Stairs;Transfers;Locomotion Level    Examination-Participation Restrictions  Community Activity    Stability/Clinical Decision Making  Stable/Uncomplicated    Rehab Potential  Good    PT Frequency  2x / week    PT Duration  6 weeks    PT Treatment/Interventions  ADLs/Self Care Home Management;Aquatic Therapy;Electrical Stimulation;Therapeutic exercise;Therapeutic activities;Functional mobility training;Stair training;Gait training;DME Instruction;Balance training;Neuromuscular re-education;Patient/family education;Orthotic Fit/Training;Energy conservation;Passive range of motion    PT Next Visit Plan  progress curb training and step ups with LLE. add to HEP as appropriate. standing balance, need practice with curb and floor <> mat transfers, tall kneeling and half kneeling activities, weight shifting to [place] and Agree with Plan of Care  Patient       Patient will benefit from skilled therapeutic intervention in order to improve the following  deficits and impairments:  Abnormal gait, Decreased balance, Decreased activity tolerance, Decreased coordination, Decreased endurance, Decreased range of motion, Difficulty walking, Decreased strength, Impaired tone, Pain  Visit Diagnosis: Other lack of coordination  Muscle weakness (generalized)  Other symptoms and signs involving the nervous system  Hemiparesis of right dominant side as late effect of cerebral infarction [facility])     Problem List Patient Active Problem List   Diagnosis Date Noted  . Hematuria 05/31/2019  . Encounter for chronic pain management 01/22/2019  . Right spastic hemiplegia ([place]) 11/27/2018  . Elevated LFTs 11/20/2018  . 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 Unintentional weight loss 11/05/2018  . Fatigue 11/05/2018  . Bloody stool 11/05/2018  . Polyarthralgia 11/05/2018  . Chronic anticoagulation 11/05/2018  .  Difficulty coping with disease 03/31/2018  . Hemiparesis of right dominant side as late effect of cerebral infarction ([place]) 09/17/2017  . Cardioembolic stroke ([place]) 11/91/4782  . 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 Cardiomyopathy ([place]) 09/17/2017  . H/O ischemic left MCA stroke 09/17/2017  . Cardiac LV ejection fraction 21-30%   . Graves disease   . Plaque psoriasis   . Neuropathy 05/19/2017    Arliss Journey, PT, DPT  07/30/2019, 12:25 PM  [facility] [address] [place], [place], [address] Phone: [phone]   Fax:  [phone]  Name: Margaret Ortiz MRN: [id number] Date of Birth: 11-17-1960

## 2019-08-02 ENCOUNTER — Ambulatory Visit: Payer: BLUE CROSS/BLUE SHIELD | Admitting: Physical Therapy

## 2019-08-02 ENCOUNTER — Encounter: Payer: Self-pay | Admitting: Physical Therapy

## 2019-08-02 ENCOUNTER — Other Ambulatory Visit: Payer: Self-pay

## 2019-08-02 DIAGNOSIS — M6281 Muscle weakness (generalized): Secondary | ICD-10-CM | POA: Diagnosis not present

## 2019-08-02 DIAGNOSIS — R2689 Other abnormalities of gait and mobility: Secondary | ICD-10-CM

## 2019-08-02 DIAGNOSIS — R29818 Other symptoms and signs involving the nervous system: Secondary | ICD-10-CM

## 2019-08-02 DIAGNOSIS — R278 Other lack of coordination: Secondary | ICD-10-CM

## 2019-08-02 NOTE — Therapy (Signed)
[facility] [address] [place], [place], [address] Phone: [phone]   Fax:  [phone]  Physical Therapy Treatment  Patient Details  Name: Margaret Ortiz MRN: [id number] Date of Birth: Feb 21, 1961 Referring Provider (PT): Howard Pouch   Encounter Date: 08/02/2019  PT End of Session - 08/02/19 1205    Visit Number  8    Number of Visits  13    Date for PT Re-Evaluation  08/08/19   due to delay in scheduling   Authorization Type  BCBCS    PT Start Time  1113   pt's transportation arrived late and pt needing to use restroom at beginning of session   PT Stop Time  1148    PT Time Calculation (min)  35 min    Activity Tolerance  Patient tolerated treatment well    Behavior During Therapy  [facility] for tasks assessed/performed       Past Medical History:  Diagnosis Date  . Aphasia 09/17/2017  . 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  . Cardiac LV ejection fraction 21-30%   . Cardioembolic stroke ([place]) 45/36/4680   Right hemiparesis  . Cardiomyopathy ([place]) 04/2017  . Dysphagia 05/19/2017   Last known diet upgraded to regular diet with thin liquids on 06/27/2017, no records available  . 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 Fall 09/17/2017  . Graves disease   . H/O ischemic left MCA stroke 09/17/2017  . Hemiparesis of right dominant side as late effect of cerebral infarction ([place]) 09/17/2017  . Hyperlipidemia   . Hypertension   . Neuropathy 05/19/2017   s/p stroke  . Plaque psoriasis   . Shoulder subluxation, right, initial encounter 09/17/2017  . Weakness 09/17/2017    Past Surgical History:  Procedure Laterality Date  . NO PAST SURGERIES      There were no vitals filed for this visit.  Subjective Assessment - 08/02/19 1114    Subjective  Wants to try the step ups again today. Has been walking. No  falls.    Pertinent History  PMH: graves disease, HLD, HTN    How long can you walk comfortably?  walks everyday - 30 minutes in the morning and 30 minutes in the afternoon    Patient Stated Goals  wants to work on walking and resistance work, wants stability exercises. wants to know how to get up off the floor - reports she really needs to work on flexion and ABD    Currently in Pain?  No/denies                       [facility] Adult PT Treatment/Exercise - 08/02/19 1214      Transfers   Transfers  Stand to Sit;Sit to Stand    Sit to Stand  5: Supervision    Stand to Sit  5: Supervision    Stand to Sit Details (indicate cue type and reason)  Verbal cues for technique    Stand to Sit Details  cues for eccentric control     Comments  1 x 10 reps sit <> stands from mat table with blue balance disc under LLE for weight shifting and weight bearing towards R. Attempted sit <> stands with green theraband around distal thighs to help cue for hip ABD, however pt unable to perform on R without compensatory hip hiking  Neuro Re-ed    Neuro Re-ed Details   On red mat on floor: tall kneeling mini squats 1 x 10 reps, tall kneeling mini squats with bias towards R side 1 x 10 reps, needed assist from therapist for proper technique, with green theraband around thighs - 2 x 10 reps L hip ABD in tall kneeling position for weight shifting and weight bearing through RLE, cues for posture and glute activation, single UE support on table. 2 x 10 reps with RLE hip flexion and extension, facing mat table sideways on floor with single UE support, needing assist from therapist at pelvis for hip protraction.      Exercises   Other Exercises   Standing in // bars: with no UE support stepping up onto 6" step with LLE to practice curb training, and then stepping up with RLE (pt compensates by using R hip hiking and circumduction to place RLE), cues for weight shift to R, 1 x 10 reps.                 PT Short Term Goals - 07/19/19 1127      PT SHORT TERM GOAL #1   Title  The patient will be able to perform HEP mod indep for LE strength (ankle isolated control, hamstring, hip extensors). ALL STGS DUE 07/18/19    Baseline  pt reporting performing HEP    Time  7   7 weeks due to delay in scheduling.   Period  Weeks    Status  Achieved    Target Date  07/18/19      PT SHORT TERM GOAL #2   Title  Patient will improve BERG balance score to at leas a 44/56 in order to demo improved balance.    Baseline  46/56 on 07/19/19    Time  7    Period  Weeks    Status  Achieved      PT SHORT TERM GOAL #3   Title  Patient will ambulate at least 500' outdoors with mod I in order to demo improved endurance for functional gait.    Baseline  unable to assess today due to weather.    Time  7    Period  Weeks    Status  Deferred      PT SHORT TERM GOAL #4   Title  Patient will decrease TUG to 16 seconds or less with SPC in order to demo decr fall risk.    Baseline  16.75 seconds on 07/19/19    Status  Not Met        PT Long Term Goals - 07/23/19 1216      PT LONG TERM GOAL #1   Title  The patient will negotiate a curb mod indep. ALL LTGS DUE 08/08/19    Time  10   10 weeks due to delay in scheduling   Period  Weeks      PT LONG TERM GOAL #2   Title  The patient will move floor<>stand with mod indep using L UE support on surface.    Baseline  not yet assessed.    Time  10    Period  Weeks    Status  New      PT LONG TERM GOAL #3   Title  The patient will improve gait speed without device from 1.33 ft/sec to > or equal to 1.8 ft/sec to demo improving balance/stability without a device.    Baseline  1.33 ft/sec no AD  Status  New      PT LONG TERM GOAL #4   Title  Patient will improve BERG balance score to a 47/56 in order to demo decr fall risk.    Baseline  42/56    Time  10    Period  Weeks    Status  New      PT LONG TERM GOAL #5   Title  Patient will be  independent with final HEP in order to build upon functional gains made in therapy.    Time  10    Period  Weeks    Status  New            Plan - 08/02/19 1207    Clinical Impression Statement  Focus of today's skilled session focused on LLE strengthening for curb training, RLE strengthening and weight shifting/weight bearing activities. Pt still needing min A to help with placement of RLE for floor <> stand transfers. Able to perform step ups with LLE onto 6" step with no UE support, pt unable to step RLE up without compensatory hip hiking. Will continue to progress towards LTGs.    Personal Factors and Comorbidities  Comorbidity 3+;Past/Current Experience;Time since onset of injury/illness/exacerbation    Comorbidities  graves disease, HLD, HTN    Examination-Activity Limitations  Stairs;Transfers;Locomotion Level    Examination-Participation Restrictions  Community Activity    Stability/Clinical Decision Making  Stable/Uncomplicated    Rehab Potential  Good    PT Frequency  2x / week    PT Duration  6 weeks    PT Treatment/Interventions  ADLs/Self Care Home Management;Aquatic Therapy;Electrical Stimulation;Therapeutic exercise;Therapeutic activities;Functional mobility training;Stair training;Gait training;DME Instruction;Balance training;Neuromuscular re-education;Patient/family education;Orthotic Fit/Training;Energy conservation;Passive range of motion    PT Next Visit Plan  goals due by 2/14. progress curb training and step ups with LLE. add to HEP as appropriate. standing balance,  floor <> mat transfers, tall kneeling and half kneeling activities, weight shifting to [place] and Agree with Plan of Care  Patient       Patient will benefit from skilled therapeutic intervention in order to improve the following deficits and impairments:  Abnormal gait, Decreased balance, Decreased activity tolerance, Decreased coordination, Decreased  endurance, Decreased range of motion, Difficulty walking, Decreased strength, Impaired tone, Pain  Visit Diagnosis: Other lack of coordination  Muscle weakness (generalized)  Other symptoms and signs involving the nervous system  Other abnormalities of gait and mobility     Problem List Patient Active Problem List   Diagnosis Date Noted  . Hematuria 05/31/2019  . Encounter for chronic pain management 01/22/2019  . Right spastic hemiplegia ([place]) 11/27/2018  . Elevated LFTs 11/20/2018  . 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 Unintentional weight loss 11/05/2018  . Fatigue 11/05/2018  . Bloody stool 11/05/2018  . Polyarthralgia 11/05/2018  . Chronic anticoagulation 11/05/2018  . Difficulty coping with disease 03/31/2018  . Hemiparesis of right dominant side as late effect of cerebral infarction ([place]) 09/17/2017  . Cardioembolic stroke ([place]) 54/02/8118  . 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 Cardiomyopathy ([place]) 09/17/2017  . H/O ischemic left MCA stroke 09/17/2017  . Cardiac LV ejection fraction 21-30%   . Graves disease   . Plaque psoriasis   . Neuropathy 05/19/2017    Arliss Journey, PT, DPT  08/02/2019, 12:15 PM  [facility] [address]  [place], [place], [address] Phone: [phone]   Fax:  [phone]  Name: Margaret Ortiz MRN: [id number] Date of Birth: 25-May-1961

## 2019-08-04 ENCOUNTER — Ambulatory Visit: Payer: BLUE CROSS/BLUE SHIELD | Admitting: Occupational Therapy

## 2019-08-04 ENCOUNTER — Other Ambulatory Visit: Payer: Self-pay

## 2019-08-04 DIAGNOSIS — M25511 Pain in right shoulder: Secondary | ICD-10-CM

## 2019-08-04 DIAGNOSIS — M6281 Muscle weakness (generalized): Secondary | ICD-10-CM | POA: Diagnosis not present

## 2019-08-04 DIAGNOSIS — G8929 Other chronic pain: Secondary | ICD-10-CM

## 2019-08-04 DIAGNOSIS — I69351 Hemiplegia and hemiparesis following cerebral infarction affecting right dominant side: Secondary | ICD-10-CM

## 2019-08-04 NOTE — Therapy (Signed)
[facility] [address] [place], [place], [address] Phone: [phone]   Fax:  [phone]  Occupational Therapy Treatment  Patient Details  Name: Margaret Ortiz MRN: [id number] Date of Birth: 18-Aug-1960 No data recorded  Encounter Date: 08/04/2019  OT End of Session - 08/04/19 1055    Visit Number  3    Number of Visits  11    Date for OT Re-Evaluation  08/28/19    Authorization Type  [employer]    OT Start Time  [id number]    OT Stop Time  1055    OT Time Calculation (min)  40 min    Activity Tolerance  Patient tolerated treatment well    Behavior During Therapy  [facility] for tasks assessed/performed       Past Medical History:  Diagnosis Date  . Aphasia 09/17/2017  . 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  . Cardiac LV ejection fraction 21-30%   . Cardioembolic stroke ([place]) [id number]   Right hemiparesis  . Cardiomyopathy ([place]) 04/2017  . Dysphagia 05/19/2017   Last known diet upgraded to regular diet with thin liquids on 06/27/2017, no records available  . 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 Fall 09/17/2017  . Graves disease   . H/O ischemic left MCA stroke 09/17/2017  . Hemiparesis of right dominant side as late effect of cerebral infarction ([place]) 09/17/2017  . Hyperlipidemia   . Hypertension   . Neuropathy 05/19/2017   s/p stroke  . Plaque psoriasis   . Shoulder subluxation, right, initial encounter 09/17/2017  . Weakness 09/17/2017    Past Surgical History:  Procedure Laterality Date  . NO PAST SURGERIES      There were no vitals filed for this visit.  Subjective Assessment - 08/04/19 1020    Subjective   I took the gabapentin so pain only 3/10 but worse without medicine    Pertinent History  Pt with CVA 05/19/2017    Patient Stated Goals  decrease shoulder pain    Currently in Pain?  Yes    Pain  Score  3     Pain Location  Shoulder    Pain Orientation  Right    Pain Descriptors / Indicators  Aching    Pain Type  Chronic pain    Pain Onset  More than a month ago    Pain Frequency  Intermittent    Aggravating Factors   malpositioning    Pain Relieving Factors  proper positioning, pain meds       Fabricated and fitted new resting hand splint for Rt hand. Issued and reviewed wear and care                    OT Education - 08/04/19 1057    Education Details  splint wear and care    Person(s) Educated  Patient    Methods  Explanation    Comprehension  Verbalized understanding       OT Short Term Goals - 12/02/18 1322      OT SHORT TERM GOAL #1   Title  Pt/family independent with updated HEP for RUE-- check STGs 08/28/18    Status  Achieved      OT SHORT TERM GOAL #2   Title  Pt will report R shoulder/neck pain less than or equal to 5/10 for functional tasks.    Status  Achieved  OT SHORT TERM GOAL #3   Title  Pt will verbalize understanding of proper positioning of RUE to decr pain.    Status  Achieved        OT Long Term Goals - 08/04/19 1056      OT LONG TERM GOAL #1   Title  Pt will perform simple home maintenance tasks mod I incorporating RUE as stabilizer or using hand-over-hand techniques when able.    Time  5    Period  Weeks    Status  New      OT LONG TERM GOAL #2   Title  Pt will report R shoulder pain less than or equal to 3/10 for ADLs.    Time  5    Period  Weeks    Status  New   5/10     OT LONG TERM GOAL #3   Title  I with updated HEP.    Time  5    Period  Weeks    Status  On-going      OT LONG TERM GOAL #4   Title  Pt will verbalize/ demonstrate understanding of positioning to minimize pain and risk for RUE injury.    Time  5    Period  Weeks    Status  On-going            Plan - 08/04/19 1057    Clinical Impression Statement  Pt progressing towards goals. Pt w/ well fitted resting hand splint issued  today    Occupational performance deficits (Please refer to evaluation for details):  ADL's;IADL's;Leisure;Work;Social Participation    Body Structure / Function / Physical Skills  ADL;Balance;Body mechanics;Pain;ROM;UE functional use;Tone;IADL;GMC;FMC;Coordination;Sensation;Strength;Dexterity;Decreased knowledge of precautions    Rehab Potential  Good    OT Frequency  2x / week    OT Duration  --   5 weeks plus eval   OT Treatment/Interventions  Self-care/ADL training;Electrical Stimulation;Therapeutic exercise;Patient/family education;Splinting;Neuromuscular education;Paraffin;Moist Heat;Fluidtherapy;Energy conservation;Therapist, nutritional;Therapeutic activities;Cryotherapy;Ultrasound;DME and/or AE instruction;Manual Therapy;Passive range of motion;Cognitive remediation/compensation;Balance training;Aquatic Therapy    Plan  assess splint, pain reduction strategies, work towards goals.    Consulted and Agree with Plan of Care  Patient       Patient will benefit from skilled therapeutic intervention in order to improve the following deficits and impairments:   Body Structure / Function / Physical Skills: ADL, Balance, Body mechanics, Pain, ROM, UE functional use, Tone, IADL, GMC, FMC, Coordination, Sensation, Strength, Dexterity, Decreased knowledge of precautions       Visit Diagnosis: Hemiparesis of right dominant side as late effect of cerebral infarction [facility])  Chronic right shoulder pain    Problem List Patient Active Problem List   Diagnosis Date Noted  . Hematuria 05/31/2019  . Encounter for chronic pain management 01/22/2019  . Right spastic hemiplegia ([place]) 11/27/2018  . Elevated LFTs 11/20/2018  . 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 Unintentional weight loss 11/05/2018  . Fatigue 11/05/2018  . Bloody stool 11/05/2018  . Polyarthralgia 11/05/2018  . Chronic anticoagulation 11/05/2018  . Difficulty coping  with disease 03/31/2018  . Hemiparesis of right dominant side as late effect of cerebral infarction ([place]) 09/17/2017  . Cardioembolic stroke ([place]) [id number]  . 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 Cardiomyopathy ([place]) 09/17/2017  . H/O ischemic left MCA stroke 09/17/2017  . Cardiac LV ejection fraction 21-30%   . Graves disease   . Plaque psoriasis   . Neuropathy 05/19/2017  Carey Bullocks, OTR/L 08/04/2019, 11:03 AM  [facility] [address] [place], [place], [address] Phone: [phone]   Fax:  [phone]  Name: Margaret Ortiz MRN: [id number] Date of Birth: 1960-08-15

## 2019-08-06 ENCOUNTER — Encounter
Payer: BLUE CROSS/BLUE SHIELD | Attending: Physical Medicine & Rehabilitation | Admitting: Physical Medicine & Rehabilitation

## 2019-08-06 ENCOUNTER — Other Ambulatory Visit: Payer: Self-pay

## 2019-08-06 ENCOUNTER — Ambulatory Visit: Payer: BLUE CROSS/BLUE SHIELD | Admitting: Physical Therapy

## 2019-08-06 ENCOUNTER — Encounter: Payer: Self-pay | Admitting: Physical Medicine & Rehabilitation

## 2019-08-06 VITALS — BP 108/70 | HR 58 | Temp 97.9°F | Ht 61.0 in | Wt 123.0 lb

## 2019-08-06 DIAGNOSIS — R29818 Other symptoms and signs involving the nervous system: Secondary | ICD-10-CM

## 2019-08-06 DIAGNOSIS — G8111 Spastic hemiplegia affecting right dominant side: Secondary | ICD-10-CM | POA: Insufficient documentation

## 2019-08-06 DIAGNOSIS — M6281 Muscle weakness (generalized): Secondary | ICD-10-CM

## 2019-08-06 DIAGNOSIS — M7918 Myalgia, other site: Secondary | ICD-10-CM | POA: Insufficient documentation

## 2019-08-06 DIAGNOSIS — I69351 Hemiplegia and hemiparesis following cerebral infarction affecting right dominant side: Secondary | ICD-10-CM

## 2019-08-06 DIAGNOSIS — R278 Other lack of coordination: Secondary | ICD-10-CM

## 2019-08-06 DIAGNOSIS — R2689 Other abnormalities of gait and mobility: Secondary | ICD-10-CM

## 2019-08-06 NOTE — Progress Notes (Signed)
Subjective:    Patient ID: Margaret Ortiz, female    DOB: 24-Feb-1961, 59 y.o.   MRN: [id number]  HPI  Would like to move back to her home in [place] but needs some bathroom and laundry room renovations needed.  Patient does not know how she will get this accomplished.  She thinks a Education officer, museum might help her get the renovations in [place].  She also has steps in her house in [place].  She has her washer dryer downstairs.  She is currently unable to ambulate safely on the steps and is unlikely to be able to achieve this goal.  Currently staying in sister's , sister cooks, pt does her own laundry, able to dress herself.  She ambulates with a cane and right AFO. Has stationary and UE ergometer at her sister's place Walks 57min twice a day  Pain Inventory Average Pain 3 Pain Right Now 3 My pain is intermittent, dull and aching  In the last 24 hours, has pain interfered with the following? General activity 3 Relation with others 3 Enjoyment of life 3 What TIME of day is your pain at its worst? daytime Sleep (in general) Fair  Pain is worse with: still for too long Pain improves with: medication Relief from Meds: 10  Mobility walk without assistance walk with assistance use a cane ability to climb steps?  no do you drive?  no  Function disabled: date disabled . I need assistance with the following:  meal prep, household duties and shopping  Neuro/Psych trouble walking spasms dizziness confusion  Prior Studies Any changes since last visit?  no  Physicians involved in your care Any changes since last visit?  no   Family History  Problem Relation Age of Onset  . Hyperlipidemia Mother   . Hypertension Mother   . Early death Father   . Hypertension Sister   . Heart attack Brother   . Mental illness Brother    Social History   Socioeconomic History  . Marital status: Divorced    Spouse name: Not on file  . Number of children: 1  . Years of education: 65    . Highest education level: Professional school degree (e.g., MD, DDS, DVM, JD)  Occupational History  . Occupation: Community education officer - [employer]  Tobacco Use  . Smoking status: Former Research scientist (life sciences)  . Smokeless tobacco: Never Used  Substance and Sexual Activity  . Alcohol use: Never  . Drug use: Never  . Sexual activity: Not Currently  Other Topics Concern  . Not on file  Social History Narrative   Divorced.  From [place], moved to [place] to stay with her family after having a stroke March 2019   Graduate degree, works as a Community education officer.   Former smoker.   Exercise routinely prior to stroke.   Wears a hearing aid.   Smoke alarms in the home.   Wears her seatbelt.      Patient is right-handed. She drinks two cup of coffee a day. She now resides with her sister and brother-in-law.   Social Determinants of Health   Financial Resource Strain:   . Difficulty of Paying Living Expenses: Not on file  Food Insecurity:   . Worried About Charity fundraiser in the Last Year: Not on file  . Ran Out of Food in the Last Year: Not on file  Transportation Needs:   . Lack of Transportation (Medical): Not on file  . Lack of Transportation (Non-Medical): Not on file  Physical Activity:   .  Days of Exercise per Week: Not on file  . Minutes of Exercise per Session: Not on file  Stress:   . Feeling of Stress : Not on file  Social Connections:   . Frequency of Communication with Friends and Family: Not on file  . Frequency of Social Gatherings with Friends and Family: Not on file  . Attends Religious Services: Not on file  . Active Member of Clubs or Organizations: Not on file  . Attends Archivist Meetings: Not on file  . Marital Status: Not on file   Past Surgical History:  Procedure Laterality Date  . NO PAST SURGERIES     Past Medical History:  Diagnosis Date  . Aphasia 09/17/2017  . Aphasia as late effect of cerebrovascular accident (CVA) 05/19/2017   s/p stroke, attempts to  speak very difficult to understand.  . Cardiac LV ejection fraction 21-30%   . Cardioembolic stroke ([place]) [id number]   Right hemiparesis  . Cardiomyopathy ([place]) 04/2017  . Dysphagia 05/19/2017   Last known diet upgraded to regular diet with thin liquids on 06/27/2017, no records available  . 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 Fall 09/17/2017  . Graves disease   . H/O ischemic left MCA stroke 09/17/2017  . Hemiparesis of right dominant side as late effect of cerebral infarction ([place]) 09/17/2017  . Hyperlipidemia   . Hypertension   . Neuropathy 05/19/2017   s/p stroke  . Plaque psoriasis   . Shoulder subluxation, right, initial encounter 09/17/2017  . Weakness 09/17/2017   BP 108/70   Pulse (!) 58   Temp 97.9 F (36.6 C)   Ht 5\' 1"  (1.549 m)   Wt 123 lb (55.8 kg)   SpO2 97%   BMI 23.24 kg/m   Opioid Risk Score:   Fall Risk Score:  `1  Depression screen PHQ 2/9  No flowsheet data found.   Review of Systems  Constitutional: Negative.   HENT: Negative.   Eyes: Negative.   Respiratory: Negative.   Cardiovascular: Negative.   Gastrointestinal: Negative.   Endocrine: Negative.   Genitourinary: Negative.   Musculoskeletal: Positive for arthralgias, back pain, gait problem and myalgias.  Skin: Negative.   Allergic/Immunologic: Negative.   Neurological: Positive for dizziness and numbness.  Hematological: Negative.   Psychiatric/Behavioral: Negative.   All other systems reviewed and are negative.      Objective:   Physical Exam Vitals and nursing note reviewed.  Constitutional:      Appearance: Normal appearance.  HENT:     Head: Normocephalic and atraumatic.  Eyes:     Extraocular Movements: Extraocular movements intact.     Conjunctiva/sclera: Conjunctivae normal.     Pupils: Pupils are equal, round, and reactive to light.  Musculoskeletal:     Comments: Patient has scapular winging on the right side with atrophy in the rhomboids and  periscapular musculature. 1.5 fingerbreadth subluxation right shoulder unchanged She has no pain with shoulder external rotation or shoulder abduction to 90 degrees. There is tenderness palpation over the right upper trapezius right infraspinatus fossa as well as the right suboccipital area  Skin:    General: Skin is warm and dry.  Neurological:     General: No focal deficit present.     Mental Status: She is alert and oriented to person, place, and time.     Gait: Gait abnormal.     Comments: Motor strength is 3 - at the right deltoid 0 at the right shoulder external rotation  are's to minus at the biceps trace finger flexors 0 finger extensors  She remains aphasic.  She has difficulty with receptive language, comprehension as well as expression she has sparse verbal output dysfluent.  She ambulates with a quad cane as well as a right AFO.  She does have evidence of right knee instability.  Psychiatric:        Mood and Affect: Mood normal.        Behavior: Behavior normal.        Thought Content: Thought content normal.        Judgment: Judgment normal.           Assessment & Plan:   1.  Right spastic hemiparesis , aphasia due to remote Left MCA visit.  Pt states she will regain normal strength in the RUE in the course of 5 years.  We discussed that this is highly unlikely and that the majority of motor recovery occurs within the first 6 mo after stroke.  We discussed that her post stroke shoulder pain is associated with the weakness and abnormal muscle firing patterns due to the stroke. She indicates that she plans to return to [place] and live independently.  She does not have a good plan in terms of problem solving the challenges that she would have.  She has poor insight into her deficits.  In terms of driving her reaction time would not allow for safe driving.  Would not recommend return to driving.  We discussed that her trapezius pain is due to overuse of this muscle to  compensate for deltoid weakness.   We discussed the trigger point injection will not improve her hemiparesis and will provide some temporary relief for the myofascial pain.  Trigger Point Injection  Indication: Right peri scapular  Myofascial pain not relieved by medication management and other conservative care.  Informed consent was obtained after describing risk and benefits of the procedure with the patient, this includes bleeding, bruising, infection and medication side effects.  The patient wishes to proceed and has given written consent.  The patient was placed in a seated position.  The RIght trapezius , C5 paraspinal, RIght infraspinatus area was marked and prepped with Betadine.  It was entered with a 25-gauge 1-1/2 inch needle and 1 mL of 1% lidocaine was injected into each of 3 trigger points, after negative draw back for blood.  The patient tolerated the procedure well.  Post procedure instructions were given.  As discussed with patient we could repeat this procedure in a couple months if needed.  Patient will call to schedule  Over half of the 25 min visit was spent counseling and coordinating care.

## 2019-08-06 NOTE — Therapy (Signed)
[facility] [address] [place], [place], [address] Phone: [phone]   Fax:  [phone]  Physical Therapy Treatment  Patient Details  Name: Margaret Ortiz MRN: [id number] Date of Birth: 07-28-1960 Referring Provider (PT): Howard Pouch   Encounter Date: 08/06/2019  PT End of Session - 08/06/19 1157    Visit Number  9    Number of Visits  13    Date for PT Re-Evaluation  09/05/19    Authorization Type  BC[employer]    PT Start Time  1102    PT Stop Time  1143    PT Time Calculation (min)  41 min    Equipment Utilized During Treatment  Gait belt    Activity Tolerance  Patient tolerated treatment well    Behavior During Therapy  [facility] for tasks assessed/performed       Past Medical History:  Diagnosis Date  . Aphasia 09/17/2017  . 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  . Cardiac LV ejection fraction 21-30%   . Cardioembolic stroke ([place]) 84/53/6468   Right hemiparesis  . Cardiomyopathy ([place]) 04/2017  . Dysphagia 05/19/2017   Last known diet upgraded to regular diet with thin liquids on 06/27/2017, no records available  . 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 Fall 09/17/2017  . Graves disease   . H/O ischemic left MCA stroke 09/17/2017  . Hemiparesis of right dominant side as late effect of cerebral infarction ([place]) 09/17/2017  . Hyperlipidemia   . Hypertension   . Neuropathy 05/19/2017   s/p stroke  . Plaque psoriasis   . Shoulder subluxation, right, initial encounter 09/17/2017  . Weakness 09/17/2017    Past Surgical History:  Procedure Laterality Date  . NO PAST SURGERIES      There were no vitals filed for this visit.  Subjective Assessment - 08/06/19 1105    Subjective  States that she can walk better than before. Feels like the step up exercises are really helping.    Pertinent History  PMH: graves  disease, HLD, HTN    How long can you walk comfortably?  walks everyday - 30 minutes in the morning and 30 minutes in the afternoon    Patient Stated Goals  wants to work on walking and resistance work, wants stability exercises. wants to know how to get up off the floor - reports she really needs to work on flexion and ABD    Currently in Pain?  Yes    Pain Score  3     Pain Location  Shoulder    Pain Orientation  Right    Pain Descriptors / Indicators  Aching    Pain Relieving Factors  proper positioning, pain meds.         [facility] PT Assessment - 08/06/19 1117      Berg Balance Test   Sit to Stand  Able to stand without using hands and stabilize independently    Standing Unsupported  Able to stand safely 2 minutes    Sitting with Back Unsupported but Feet Supported on Floor or Stool  Able to sit safely and securely 2 minutes    Stand to Sit  Sits safely with minimal use of hands    Transfers  Able to transfer safely, minor use of hands    Standing Unsupported with Eyes Closed  Able to stand 10 seconds safely    Standing  Unsupported with Feet Together  Able to place feet together independently and stand 1 minute safely    From Standing, Reach Forward with Outstretched Arm  Can reach forward >12 cm safely (5")    From Standing Position, Pick up Object from [place] to pick up shoe safely and easily    From Standing Position, Turn to Look Behind Over each Shoulder  Looks behind from both sides and weight shifts well    Turn 360 Degrees  Able to turn 360 degrees safely but slowly   4.35 to R, 5.63 to L    Standing Unsupported, Alternately Place Feet on Step/Stool  Able to complete 4 steps without aid or supervision    Standing Unsupported, One Foot in Front  Able to plae foot ahead of the other independently and hold 30 seconds    Standing on One Leg  Able to lift leg independently and hold equal to or more than 3 seconds   4 seconds, standing on LLE   Total Score  48    Berg  comment:  48/56                   [facility] Adult PT Treatment/Exercise - 08/06/19 1207      Ambulation/Gait   Ambulation/Gait  Yes    Ambulation/Gait Assistance  5: Supervision;4: Min guard    Ambulation Distance (Feet)  115 Feet    Assistive device  None;Straight cane    Gait Pattern  Step-through pattern;Decreased stance time - right;Decreased step length - left;Decreased hip/knee flexion - right;Decreased dorsiflexion - right;Decreased weight shift to right;Poor foot clearance - right;Right hip hike    Ambulation Surface  Level;Indoor    Gait velocity  1.75 ft/sec with SPC, 1.79 ft/sec with no AD    Stairs  Yes    Stairs Assistance  5: Supervision    Stair Management Technique  One rail Left;Alternating pattern;Step to pattern    Number of Stairs  4    Height of Stairs  6    Curb  Other (comment);5: Supervision   close min guard ascending    Curb Details (indicate cue type and reason)  supervision descending curb, pt needs cueing for proper LLE and cane placement for step up and to make sure LLE is fully on curb before stepping up, needing min guard for balance ascending, pt with improve staibility with proper SPC placement ascending x5 reps    Gait Comments  min guard no AD. educated pt that based on fall risk per BERG and gait speed to continue use of SPC, also educated pt on improved gait mechanics and incr stance on RLE when using SPC, pt verbalized understanding       Therapeutic Activites    Therapeutic Activities  Other Therapeutic Activities    Other Therapeutic Activities  to prep for curb at staircase: LLE step ups onto 6" step with single UE support on railing progressing to no UE support with min guard from therapist for incr weight shifting towards RLE. Next to mat table: performed stand <> floor transfers on red mat x3 reps, pt given incr time to problem solve, needing min A initial rep due to poor descent down to floor, after instructing pt on proper technique  (stepping back with LLE and using LUE to help lower), pt able to perform remaining 2 reps with min guard/supervision             PT Education - 08/06/19 1156    Education  Details  progess towards goals    Person(s) Educated  Patient    Methods  Explanation    Comprehension  Verbalized understanding       PT Short Term Goals - 07/19/19 1127      PT SHORT TERM GOAL #1   Title  The patient will be able to perform HEP mod indep for LE strength (ankle isolated control, hamstring, hip extensors). ALL STGS DUE 07/18/19    Baseline  pt reporting performing HEP    Time  7   7 weeks due to delay in scheduling.   Period  Weeks    Status  Achieved    Target Date  07/18/19      PT SHORT TERM GOAL #2   Title  Patient will improve BERG balance score to at leas a 44/56 in order to demo improved balance.    Baseline  46/56 on 07/19/19    Time  7    Period  Weeks    Status  Achieved      PT SHORT TERM GOAL #3   Title  Patient will ambulate at least 500' outdoors with mod I in order to demo improved endurance for functional gait.    Baseline  unable to assess today due to weather.    Time  7    Period  Weeks    Status  Deferred      PT SHORT TERM GOAL #4   Title  Patient will decrease TUG to 16 seconds or less with SPC in order to demo decr fall risk.    Baseline  16.75 seconds on 07/19/19    Status  Not Met        PT Long Term Goals - 08/06/19 1108      PT LONG TERM GOAL #1   Title  The patient will negotiate a curb mod indep. ALL LTGS DUE 08/08/19    Baseline  min guard ascending, supervision descending with SPC.    Time  10   10 weeks due to delay in scheduling   Period  Weeks    Status  Not Met      PT LONG TERM GOAL #2   Title  The patient will move floor<>stand with mod indep using L UE support on surface.    Baseline  perform floor <> stand 3 reps today - initial min A, and remaining 2 reps are min guard/supervision.    Time  10    Period  Weeks    Status  Not Met       PT LONG TERM GOAL #3   Title  The patient will improve gait speed without device from 1.33 ft/sec to > or equal to 1.8 ft/sec to demo improving balance/stability without a device.    Baseline  18.71 seconds with SPC = 1.75 ft/sec, 18.31 seconds with no AD = 1.79 ft/sec.    Status  Partially Met      PT LONG TERM GOAL #4   Title  Patient will improve BERG balance score to a 47/56 in order to demo decr fall risk.    Baseline  48/56 on 08/06/19    Time  10    Period  Weeks    Status  Achieved      PT LONG TERM GOAL #5   Title  Patient will be independent with final HEP in order to build upon functional gains made in therapy.    Baseline  pt reports performing at home and is  walking.    Time  10    Period  Weeks    Status  Partially Met       Revised/on-going LTGs for re-cert:  PT Long Term Goals - 08/06/19 1218      PT LONG TERM GOAL #1   Title  The patient will negotiate a curb with SPC with supervision. ALL LTGS DUE 08/27/19    Baseline  min guard ascending, supervision descending with SPC.    Time  3    Period  Weeks    Status  Revised    Target Date  08/27/19      PT LONG TERM GOAL #2   Title  The patient will move floor<>stand with mod indep using L UE support on surface.    Baseline  perform floor <> stand 3 reps today - initial min A, and remaining 2 reps are min guard/supervision.    Time  3    Period  Weeks    Status  On-going      PT LONG TERM GOAL #3   Title  Pt will improve gait speed with SPC to at least 1.85 ft/sec to demo decr fall risk.    Baseline  18.71 seconds with SPC = 1.75 ft/sec, 18.31 seconds with no AD = 1.79 ft/sec.    Time  3    Period  Weeks    Status  Revised      PT LONG TERM GOAL #5   Title  Patient will be independent with final HEP in order to build upon functional gains made in therapy.    Baseline  pt reports performing at home and is walking, will need further review and potential additons    Time  3    Period  Weeks    Status   On-going           08/06/19 1214  Plan  Clinical Impression Statement Focus of today's skilled session was assessing pt's LTGs. Pt has met 1 out of 5 LTGs in regards to improving BERG score -48/56 today (previously 46/56), with pt still being in the moderate fall risk category. Pt has improved her gait speed with both SPC (1.75 ft/sec) and with no AD (1.79 ft/sec), but not quite to goal level, continued to educate on fall risk and importance of using a SPC. Pt continues to need min guard/supervision for floor <> stand transfers and for curbs with SPC. Will extend POC for 1-2x a week for an additional 3 weeks to continue to focus on curb training, floor transfers, LE strength, and finalizing pt's HEP. LTGs revised as appropriate.  Personal Factors and Comorbidities Comorbidity 3+;Past/Current Experience;Time since onset of injury/illness/exacerbation  Comorbidities graves disease, HLD, HTN  Examination-Activity Limitations Stairs;Transfers;Locomotion Level  Examination-Participation Restrictions Community Activity  Pt will benefit from skilled therapeutic intervention in order to improve on the following deficits Abnormal gait;Decreased balance;Decreased activity tolerance;Decreased coordination;Decreased endurance;Decreased range of motion;Difficulty walking;Decreased strength;Impaired tone;Pain  Stability/Clinical Decision Making Stable/Uncomplicated  Rehab Potential Good  PT Frequency 2x / week (1-2x a week)  PT Duration 3 weeks  PT Treatment/Interventions ADLs/Self Care Home Management;Aquatic Therapy;Electrical Stimulation;Therapeutic exercise;Therapeutic activities;Functional mobility training;Stair training;Gait training;DME Instruction;Balance training;Neuromuscular re-education;Patient/family education;Orthotic Fit/Training;Energy conservation;Passive range of motion  PT Next Visit Plan curb training. review HEP (add as needed for R weight shift)  floor <> mat transfers, tall kneeling and  half kneeling activities, weight shifting to [place] and Agree with Plan of Care Patient  Patient will benefit from skilled therapeutic intervention in order to improve the following deficits and impairments:     Visit Diagnosis: Hemiparesis of right dominant side as late effect of cerebral infarction [facility])  Other lack of coordination  Muscle weakness (generalized)  Other symptoms and signs involving the nervous system  Other abnormalities of gait and mobility     Problem List Patient Active Problem List   Diagnosis Date Noted  . Hematuria 05/31/2019  . Encounter for chronic pain management 01/22/2019  . Right spastic hemiplegia ([place]) 11/27/2018  . Elevated LFTs 11/20/2018  . 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 Unintentional weight loss 11/05/2018  . Fatigue 11/05/2018  . Bloody stool 11/05/2018  . Polyarthralgia 11/05/2018  . Chronic anticoagulation 11/05/2018  . Difficulty coping with disease 03/31/2018  . Hemiparesis of right dominant side as late effect of cerebral infarction ([place]) 09/17/2017  . Cardioembolic stroke ([place]) 77/82/4235  . 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 Cardiomyopathy ([place]) 09/17/2017  . H/O ischemic left MCA stroke 09/17/2017  . Cardiac LV ejection fraction 21-30%   . Graves disease   . Plaque psoriasis   . Neuropathy 05/19/2017    Arliss Journey, PT, DPT  08/06/2019, 12:13 PM  [facility] [address] [place], [place], [address] Phone: [phone]   Fax:  [phone]  Name: Margaret Ortiz MRN: [id number] Date of Birth: September 15, 1960

## 2019-08-06 NOTE — Patient Instructions (Signed)
Trigger Point Injection Trigger points are areas where you have pain. A trigger point injection is a shot given in the trigger point to help relieve pain for a few days to a few months. Common places for trigger points include:  The neck.  The shoulders.  The upper back.  The lower back. A trigger point injection will not cure long-term (chronic) pain permanently. These injections do not always work for every person. For some people, they can help to relieve pain for a few days to a few months. Tell a health care provider about:  Any allergies you have.  All medicines you are taking, including vitamins, herbs, eye drops, creams, and over-the-counter medicines.  Any problems you or family members have had with anesthetic medicines.  Any blood disorders you have.  Any surgeries you have had.  Any medical conditions you have. What are the risks? Generally, this is a safe procedure. However, problems may occur, including:  Infection.  Bleeding or bruising.  Allergic reaction to the injected medicine.  Irritation of the skin around the injection site. What happens before the procedure? Ask your health care provider about:  Changing or stopping your regular medicines. This is especially important if you are taking diabetes medicines or blood thinners.  Taking medicines such as aspirin and ibuprofen. These medicines can thin your blood. Do not take these medicines unless your health care provider tells you to take them.  Taking over-the-counter medicines, vitamins, herbs, and supplements. What happens during the procedure?   Your health care provider will feel for trigger points. A marker may be used to circle the area for the injection.  The skin over the trigger point will be washed with a germ-killing (antiseptic) solution.  A thin needle is used for the injection. You may feel pain or a twitching feeling when the needle enters the trigger point.  A numbing solution may  be injected into the trigger point. Sometimes a medicine to keep down inflammation is also injected.  Your health care provider may move the needle around the area where the trigger point is located until the tightness and twitching goes away.  After the injection, your health care provider may put gentle pressure over the injection site.  The injection site will be covered with a bandage (dressing). The procedure may vary among health care providers and hospitals. What can I expect after treatment? After treatment, you may have:  Soreness and stiffness for 1-2 days.  A dressing. This can be taken off in a few hours or as told by your health care provider. Follow these instructions at home: Injection site care  Remove your dressing as told by your health care provider.  Check your injection site every day for signs of infection. Check for: ? Redness, swelling, or pain. ? Fluid or blood. ? Warmth. ? Pus or a bad smell. Managing pain, stiffness, and swelling  If directed, put ice on the affected area. ? Put ice in a plastic bag. ? Place a towel between your skin and the bag. ? Leave the ice on for 20 minutes, 2-3 times a day. General instructions  If you were asked to stop your regular medicines, ask your health care provider when you may start taking them again.  Return to your normal activities as told by your health care provider. Ask your health care provider what activities are safe for you.  Do not take baths, swim, or use a hot tub until your health care provider approves.    You may be asked to see an occupational or physical therapist for exercises that reduce muscle strain and stretch the area of the trigger point.  Keep all follow-up visits as told by your health care provider. This is important. Contact a health care provider if:  Your pain comes back, and it is worse than before the injection. You may need more injections.  You have chills or a fever.  The  injection site becomes more painful, red, swollen, or warm to the touch. Summary  A trigger point injection is a shot given in the trigger point to help relieve pain for a few days to a few months.  Common places for trigger point injections are the neck, shoulder, upper back, and lower back.  These injections do not always work for every person, but for some people, the injections can help to relieve pain for a few days to a few months.  Contact a health care provider if symptoms come back or they are worse than before treatment. Also, get help if the injection site becomes more painful, red, swollen, or warm to the touch. This information is not intended to replace advice given to you by your health care provider. Make sure you discuss any questions you have with your health care provider. Document Revised: 07/22/2018 Document Reviewed: 07/22/2018 Elsevier Patient Education  2020 [employer]  

## 2019-08-10 ENCOUNTER — Encounter: Payer: Self-pay | Admitting: Obstetrics and Gynecology

## 2019-08-10 ENCOUNTER — Other Ambulatory Visit: Payer: Self-pay

## 2019-08-10 ENCOUNTER — Ambulatory Visit (INDEPENDENT_AMBULATORY_CARE_PROVIDER_SITE_OTHER): Payer: BLUE CROSS/BLUE SHIELD | Admitting: Obstetrics and Gynecology

## 2019-08-10 VITALS — BP 122/78 | Ht 61.0 in | Wt 120.0 lb

## 2019-08-10 DIAGNOSIS — D259 Leiomyoma of uterus, unspecified: Secondary | ICD-10-CM | POA: Diagnosis not present

## 2019-08-10 NOTE — Patient Instructions (Addendum)
Let us know if you develop any symptoms such as abdominal pain, decreased appetite, extreme pelvic bloating or pressure, or any vaginal bleeding.  Uterine Fibroids  Uterine fibroids are lumps of tissue (tumors) in your womb (uterus). They are not cancer (are benign). Most women with this condition do not need treatment. Sometimes fibroids can affect your ability to have children (your fertility). If that happens, you may need surgery to take out the fibroids. Follow these instructions at home:  Take over-the-counter and prescription medicines only as told by your doctor. Your doctor may suggest NSAIDs (such as aspirin or ibuprofen) to help with pain.  Ask your doctor if you should: ? Take iron pills. ? Eat more foods that have iron in them, such as dark green, leafy vegetables.  If directed, apply heat to your back or belly to reduce pain. Use the heat source that your doctor recommends, such as a moist heat pack or a heating pad. ? Put a towel between your skin and the heat source. ? Leave the heat on for 20-30 minutes. ? Remove the heat if your skin turns bright red. This is especially important if you are unable to feel pain, heat, or cold. You may have a greater risk of getting burned.  Pay close attention to your period (menstrual) cycles. Tell your doctor about any changes, such as: ? A heavier blood flow than usual. ? Needing to use more pads or tampons than normal. ? A change in how many days your period lasts. ? A change in symptoms that come with your period, such as cramps or back pain.  Keep all follow-up visits as told by your doctor. This is important. Your doctor may need to watch your fibroids over time for any changes. Contact a doctor if you:  Have pain that does not get better with medicine or heat, such as pain or cramps in: ? Your back. ? The area between your hip bones (pelvic area). ? Your belly.  Have new bleeding between your periods.  Have more bleeding  during or between your periods.  Feel very tired or weak.  Feel light-headed. Get help right away if you:  Pass out (faint).  Have pain in the area between your hip bones that suddenly gets worse.  Have bleeding that soaks a tampon or pad in 30 minutes or less. Summary  Uterine fibroids are lumps of tissue (tumors) in your womb (uterus). They are not cancer.  The only treatment that most women need is taking aspirin or ibuprofen for pain.  Contact a doctor if you have pain or cramps that do not get better with medicine.  Make sure you know what symptoms you should get help for right away. This information is not intended to replace advice given to you by your health care provider. Make sure you discuss any questions you have with your health care provider. Document Revised: 05/23/2017 Document Reviewed: 05/06/2017 Elsevier Patient Education  2020 [employer].

## 2019-08-10 NOTE — Progress Notes (Addendum)
   Ernestene Nucci  30-Nov-1960 [id number]  HPI The patient is a 59 y.o. menopausal G3P1021 who is referred here due to incidental finding of a left posterior 4.2 cm fibroid on recent abdominal/pelvic CT imaging performed to work up RLQ pain and hematuria.  She unfortunately had a cerebrovascular accident with right hemiparesis in 2018 and has been managing sequelae from that.  She says she was aware she had a fibroid and was told this about 5 years ago.  She denies any vaginal bleeding or pelvic/abdominal pain.  Past medical history,surgical history, problem list, medications, allergies, family history and social history were all reviewed and documented as reviewed in the EPIC chart.  ROS:  Feeling well. No dyspnea or chest pain on exertion.  No abdominal pain, change in bowel habits, black or bloody stools.  No urinary tract symptoms. GYN ROS: no abnormal bleeding, pelvic pain or discharge  Physical Exam  BP 122/78   Ht 5\' 1"  (1.549 m)   Wt 120 lb (54.4 kg)   BMI 22.67 kg/m   General: Pleasant female, no acute distress, alert and oriented.  Moderate degree of dysphasia present with intact coherent speech and thought process. PELVIC EXAM: Deferred  CT imaging is independently reviewed and I do agree with impression of the posterior left fibroid.  Assessment 59 y.o. [id number] postmenopausal female with asymptomatic posterior uterine fibroid  Plan We discussed the management surveillance of a fibroid is typically minimal if one is not experiencing any symptoms such as vaginal bleeding, or pelvic pain, or bloating/changes in appetite or bowel habits attributable to other causes.  She is not having any of these concerns at this time.  We discussed fibroids and showed diagrams to illustrate what they are.  Her fibroid is fairly small.  I recommended that she keep an eye on development of any symptoms and to let us know if that is occurring we would be happy to see her back for further evaluation at  that point.  All of her questions were answered to her satisfaction today.  Joseph Pierini MD 08/10/19

## 2019-08-11 ENCOUNTER — Ambulatory Visit: Payer: BLUE CROSS/BLUE SHIELD | Admitting: Occupational Therapy

## 2019-08-11 DIAGNOSIS — R29818 Other symptoms and signs involving the nervous system: Secondary | ICD-10-CM

## 2019-08-11 DIAGNOSIS — I69351 Hemiplegia and hemiparesis following cerebral infarction affecting right dominant side: Secondary | ICD-10-CM

## 2019-08-11 DIAGNOSIS — M6281 Muscle weakness (generalized): Secondary | ICD-10-CM

## 2019-08-11 DIAGNOSIS — R278 Other lack of coordination: Secondary | ICD-10-CM

## 2019-08-12 NOTE — Therapy (Signed)
[facility] [address] [place], [place], [address] Phone: [phone]   Fax:  [phone]  Occupational Therapy Treatment  Patient Details  Name: Margaret Ortiz MRN: [id number] Date of Birth: 15-Dec-1960 No data recorded  Encounter Date: 08/11/2019  OT End of Session - 08/12/19 1110    Visit Number  4    Number of Visits  11    Date for OT Re-Evaluation  08/28/19    Authorization Type  BCBS    OT Start Time  1150    OT Stop Time  1230    OT Time Calculation (min)  40 min    Activity Tolerance  Patient tolerated treatment well    Behavior During Therapy  [facility] for tasks assessed/performed       Past Medical History:  Diagnosis Date  . Aphasia 09/17/2017  . 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  . Cardiac LV ejection fraction 21-30%   . Cardioembolic stroke ([place]) [id number]   Right hemiparesis  . Cardiomyopathy ([place]) 04/2017  . Dysphagia 05/19/2017   Last known diet upgraded to regular diet with thin liquids on 06/27/2017, no records available  . Dysphagia as late effect of cerebral aneurysm 09/17/2017  . Fall 09/17/2017  . Graves disease   . H/O ischemic left MCA stroke 09/17/2017  . Hemiparesis of right dominant side as late effect of cerebral infarction ([place]) 09/17/2017  . Hyperlipidemia   . Hypertension   . Neuropathy 05/19/2017   s/p stroke  . Plaque psoriasis   . Shoulder subluxation, right, initial encounter 09/17/2017  . Weakness 09/17/2017    Past Surgical History:  Procedure Laterality Date  . NO PAST SURGERIES      There were no vitals filed for this visit.  Subjective Assessment - 08/12/19 1109    Pertinent History  Pt with CVA 05/19/2017    Patient Stated Goals  decrease shoulder pain    Currently in Pain?  Yes    Pain Score  3     Pain Location  Shoulder    Pain Orientation  Right    Pain Descriptors /  Indicators  Aching    Pain Type  Chronic pain    Pain Onset  More than a month ago    Pain Frequency  Constant    Aggravating Factors   malpositioning    Pain Relieving Factors  repostioning            Treatment: Hotpack applied to right shoulder x 10 mins, no adverse reaction.while therapist performed scapular and joint mobs, supine closed chain shoulder flexion and passive stretch to elbow, wrist and digits. Seated low range self ROM with RUE supported on cane, min-mod facilitation. Discussion with pt. Regarding imprortance of avoiding activities that increase shoulder pain, and compensatory shoulder hike. Therapist reviewed that it is unlikely that pt will regain active movement in RUE and that minimizing pain and maintaining ROM is goal at this point. Pt reports she plans to return to her own home with some assist from her son. Therapist reinforced that pt will need continued family assist for safety.                  OT Short Term Goals - 12/02/18 1322      OT SHORT TERM GOAL #1   Title  Pt/family independent with updated HEP for RUE-- check STGs 08/28/18    Status  Achieved      OT SHORT TERM GOAL #2   Title  Pt will report R shoulder/neck pain less than or equal to 5/10 for functional tasks.    Status  Achieved      OT SHORT TERM GOAL #3   Title  Pt will verbalize understanding of proper positioning of RUE to decr pain.    Status  Achieved        OT Long Term Goals - 08/04/19 1056      OT LONG TERM GOAL #1   Title  Pt will perform simple home maintenance tasks mod I incorporating RUE as stabilizer or using hand-over-hand techniques when able.    Time  5    Period  Weeks    Status  New      OT LONG TERM GOAL #2   Title  Pt will report R shoulder pain less than or equal to 3/10 for ADLs.    Time  5    Period  Weeks    Status  New   5/10     OT LONG TERM GOAL #3   Title  I with updated HEP.    Time  5    Period  Weeks    Status  On-going       OT LONG TERM GOAL #4   Title  Pt will verbalize/ demonstrate understanding of positioning to minimize pain and risk for RUE injury.    Time  5    Period  Weeks    Status  On-going            Plan - 08/12/19 1111    Clinical Impression Statement  Pt progressing slowly towards goals. Pt received a trigger point injection from Dr. Letta Pate. Pt remains limited by decreased understanding that deficits will likely persist as pt is now approx 2 years post CVA.    Occupational performance deficits (Please refer to evaluation for details):  ADL's;IADL's;Leisure;Work;Social Participation    Body Structure / Function / Physical Skills  ADL;Balance;Body mechanics;Pain;ROM;UE functional use;Tone;IADL;GMC;FMC;Coordination;Sensation;Strength;Dexterity;Decreased knowledge of precautions    Rehab Potential  Good    OT Frequency  2x / week    OT Duration  --   5 weeks plus eval   OT Treatment/Interventions  Self-care/ADL training;Electrical Stimulation;Therapeutic exercise;Patient/family education;Splinting;Neuromuscular education;Paraffin;Moist Heat;Fluidtherapy;Energy conservation;Therapist, nutritional;Therapeutic activities;Cryotherapy;Ultrasound;DME and/or AE instruction;Manual Therapy;Passive range of motion;Cognitive remediation/compensation;Balance training;Aquatic Therapy    Plan  assess splint, pain reduction strategies, work towards goals.    Consulted and Agree with Plan of Care  Patient       Patient will benefit from skilled therapeutic intervention in order to improve the following deficits and impairments:   Body Structure / Function / Physical Skills: ADL, Balance, Body mechanics, Pain, ROM, UE functional use, Tone, IADL, GMC, FMC, Coordination, Sensation, Strength, Dexterity, Decreased knowledge of precautions       Visit Diagnosis: Hemiparesis of right dominant side as late effect of cerebral infarction [facility])  Other lack of coordination  Muscle weakness  (generalized)  Other symptoms and signs involving the nervous system    Problem List Patient Active Problem List   Diagnosis Date Noted  . Hematuria 05/31/2019  . Encounter for chronic pain management 01/22/2019  . Right spastic hemiplegia ([place]) 11/27/2018  . Elevated LFTs 11/20/2018  . 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 Unintentional weight loss 11/05/2018  . Fatigue 11/05/2018  . Bloody stool 11/05/2018  . Polyarthralgia 11/05/2018  . Chronic anticoagulation 11/05/2018  .  Difficulty coping with disease 03/31/2018  . Hemiparesis of right dominant side as late effect of cerebral infarction ([place]) 09/17/2017  . Cardioembolic stroke ([place]) [id number]  . 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 Cardiomyopathy ([place]) 09/17/2017  . H/O ischemic left MCA stroke 09/17/2017  . Cardiac LV ejection fraction 21-30%   . Graves disease   . Plaque psoriasis   . Neuropathy 05/19/2017    Salvador Coupe 08/12/2019, 11:13 AM  [facility] [address] [place], [place], [address] Phone: [phone]   Fax:  [phone]  Name: Titilayo Vaugh MRN: [id number] Date of Birth: June 19, 1961

## 2019-08-13 ENCOUNTER — Other Ambulatory Visit: Payer: Self-pay

## 2019-08-13 ENCOUNTER — Encounter: Payer: Self-pay | Admitting: Physical Therapy

## 2019-08-13 ENCOUNTER — Encounter: Payer: Self-pay | Admitting: Occupational Therapy

## 2019-08-13 ENCOUNTER — Ambulatory Visit: Payer: BLUE CROSS/BLUE SHIELD | Admitting: Physical Therapy

## 2019-08-13 ENCOUNTER — Ambulatory Visit: Payer: BLUE CROSS/BLUE SHIELD | Admitting: Occupational Therapy

## 2019-08-13 DIAGNOSIS — I69351 Hemiplegia and hemiparesis following cerebral infarction affecting right dominant side: Secondary | ICD-10-CM

## 2019-08-13 DIAGNOSIS — R2689 Other abnormalities of gait and mobility: Secondary | ICD-10-CM

## 2019-08-13 DIAGNOSIS — M6281 Muscle weakness (generalized): Secondary | ICD-10-CM

## 2019-08-13 DIAGNOSIS — R278 Other lack of coordination: Secondary | ICD-10-CM

## 2019-08-13 DIAGNOSIS — R29818 Other symptoms and signs involving the nervous system: Secondary | ICD-10-CM

## 2019-08-13 DIAGNOSIS — G8929 Other chronic pain: Secondary | ICD-10-CM

## 2019-08-13 DIAGNOSIS — R2681 Unsteadiness on feet: Secondary | ICD-10-CM

## 2019-08-13 NOTE — Therapy (Signed)
[facility] [address] [place], [place], [address] Phone: [phone]   Fax:  [phone]  Occupational Therapy Treatment  Patient Details  Name: Margaret Ortiz MRN: [id number] Date of Birth: 16-Dec-1960 No data recorded  Encounter Date: 08/13/2019  OT End of Session - 08/13/19 1237    Visit Number  5    Number of Visits  11    Date for OT Re-Evaluation  08/28/19    Authorization Type  BCBS    OT Start Time  1234    OT Stop Time  1314    OT Time Calculation (min)  40 min    Activity Tolerance  Patient tolerated treatment well    Behavior During Therapy  [facility] for tasks assessed/performed       Past Medical History:  Diagnosis Date  . Aphasia 09/17/2017  . 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  . Cardiac LV ejection fraction 21-30%   . Cardioembolic stroke ([place]) [id number]   Right hemiparesis  . Cardiomyopathy ([place]) 04/2017  . Dysphagia 05/19/2017   Last known diet upgraded to regular diet with thin liquids on 06/27/2017, no records available  . 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 Fall 09/17/2017  . Graves disease   . H/O ischemic left MCA stroke 09/17/2017  . Hemiparesis of right dominant side as late effect of cerebral infarction ([place]) 09/17/2017  . Hyperlipidemia   . Hypertension   . Neuropathy 05/19/2017   s/p stroke  . Plaque psoriasis   . Shoulder subluxation, right, initial encounter 09/17/2017  . Weakness 09/17/2017    Past Surgical History:  Procedure Laterality Date  . NO PAST SURGERIES      There were no vitals filed for this visit.  Subjective Assessment - 08/13/19 1236    Pertinent History  Pt with CVA 05/19/2017    Patient Stated Goals  decrease shoulder pain    Currently in Pain?  Yes    Pain Score  3     Pain Location  Shoulder    Pain Orientation  Right    Pain Descriptors /  Indicators  Aching    Pain Type  Chronic pain    Pain Onset  More than a month ago    Pain Frequency  Intermittent    Aggravating Factors   malpositioning    Pain Relieving Factors  repositioning    Multiple Pain Sites  No              Treatment: Hotpack applied to right shoulder x 10 mins, no adverse reaction.while therapist performed soft tissue mobs to shoulder and traps  supine closed chain shoulder flexion and passive stretch to elbow, wrist and digits. Weightbearing through edge of mat with body on arm movements Seated low range self ROM with RUE supported on cane, min-mod facilitation. Discussing regarding  importance of avoiding activities that cause pt to hike her shoulder in order to move RUE.               OT Short Term Goals - 12/02/18 1322      OT SHORT TERM GOAL #1   Title  Pt/family independent with updated HEP for RUE-- check STGs 08/28/18    Status  Achieved      OT SHORT TERM GOAL #2   Title  Pt will report R shoulder/neck pain less than or equal to 5/10 for functional  tasks.    Status  Achieved      OT SHORT TERM GOAL #3   Title  Pt will verbalize understanding of proper positioning of RUE to decr pain.    Status  Achieved        OT Long Term Goals - 08/04/19 1056      OT LONG TERM GOAL #1   Title  Pt will perform simple home maintenance tasks mod I incorporating RUE as stabilizer or using hand-over-hand techniques when able.    Time  5    Period  Weeks    Status  New      OT LONG TERM GOAL #2   Title  Pt will report R shoulder pain less than or equal to 3/10 for ADLs.    Time  5    Period  Weeks    Status  New   5/10     OT LONG TERM GOAL #3   Title  I with updated HEP.    Time  5    Period  Weeks    Status  On-going      OT LONG TERM GOAL #4   Title  Pt will verbalize/ demonstrate understanding of positioning to minimize pain and risk for RUE injury.    Time  5    Period  Weeks    Status  On-going             Plan - 08/13/19 1516    Clinical Impression Statement  Pt is progressing slowly towards goals. She benefits from reinforcement of proper positioning.    Occupational performance deficits (Please refer to evaluation for details):  ADL's;IADL's;Leisure;Work;Social Participation    Body Structure / Function / Physical Skills  ADL;Balance;Body mechanics;Pain;ROM;UE functional use;Tone;IADL;GMC;FMC;Coordination;Sensation;Strength;Dexterity;Decreased knowledge of precautions    Rehab Potential  Good    OT Frequency  2x / week    OT Duration  --   5 weeks plus eval   OT Treatment/Interventions  Self-care/ADL training;Electrical Stimulation;Therapeutic exercise;Patient/family education;Splinting;Neuromuscular education;Paraffin;Moist Heat;Fluidtherapy;Energy conservation;Therapist, nutritional;Therapeutic activities;Cryotherapy;Ultrasound;DME and/or AE instruction;Manual Therapy;Passive range of motion;Cognitive remediation/compensation;Balance training;Aquatic Therapy    Plan  assess splint, pain reduction strategies, work towards goals.    Consulted and Agree with Plan of Care  Patient       Patient will benefit from skilled therapeutic intervention in order to improve the following deficits and impairments:   Body Structure / Function / Physical Skills: ADL, Balance, Body mechanics, Pain, ROM, UE functional use, Tone, IADL, GMC, FMC, Coordination, Sensation, Strength, Dexterity, Decreased knowledge of precautions       Visit Diagnosis: Hemiparesis of right dominant side as late effect of cerebral infarction [facility])  Other lack of coordination  Muscle weakness (generalized)  Other symptoms and signs involving the nervous system  Other abnormalities of gait and mobility  Chronic right shoulder pain    Problem List Patient Active Problem List   Diagnosis Date Noted  . Hematuria 05/31/2019  . Encounter for chronic pain management 01/22/2019  . Right spastic  hemiplegia ([place]) 11/27/2018  . Elevated LFTs 11/20/2018  . 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 Unintentional weight loss 11/05/2018  . Fatigue 11/05/2018  . Bloody stool 11/05/2018  . Polyarthralgia 11/05/2018  . Chronic anticoagulation 11/05/2018  . Difficulty coping with disease 03/31/2018  . Hemiparesis of right dominant side as late effect of cerebral infarction ([place]) 09/17/2017  . Cardioembolic stroke ([place]) [id number]  . 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 Cardiomyopathy ([place]) 09/17/2017  . H/O ischemic left MCA stroke 09/17/2017  . Cardiac LV ejection fraction 21-30%   . Graves disease   . Plaque psoriasis   . Neuropathy 05/19/2017    Jamelle Noy 08/13/2019, 3:17 PM  [facility] [address] [place], [place], [address] Phone: [phone]   Fax:  [phone]  Name: Margaret Ortiz MRN: [id number] Date of Birth: 1961/02/09

## 2019-08-13 NOTE — Therapy (Signed)
[facility] [address] [place], [place], [address] Phone: [phone]   Fax:  [phone]  Physical Therapy Treatment  Patient Details  Name: Margaret Ortiz MRN: [id number] Date of Birth: July 23, 1960 Referring Provider (PT): Howard Pouch   Encounter Date: 08/13/2019  PT End of Session - 08/13/19 1317    Visit Number  10    Number of Visits  13    Date for PT Re-Evaluation  09/05/19    Authorization Type  BCBS    PT Start Time  1315    PT Stop Time  1400    PT Time Calculation (min)  45 min    Equipment Utilized During Treatment  Gait belt    Activity Tolerance  Patient tolerated treatment well    Behavior During Therapy  [facility] for tasks assessed/performed       Past Medical History:  Diagnosis Date  . Aphasia 09/17/2017  . 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  . Cardiac LV ejection fraction 21-30%   . Cardioembolic stroke ([place]) 18/84/1660   Right hemiparesis  . Cardiomyopathy ([place]) 04/2017  . Dysphagia 05/19/2017   Last known diet upgraded to regular diet with thin liquids on 06/27/2017, no records available  . 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 Fall 09/17/2017  . Graves disease   . H/O ischemic left MCA stroke 09/17/2017  . Hemiparesis of right dominant side as late effect of cerebral infarction ([place]) 09/17/2017  . Hyperlipidemia   . Hypertension   . Neuropathy 05/19/2017   s/p stroke  . Plaque psoriasis   . Shoulder subluxation, right, initial encounter 09/17/2017  . Weakness 09/17/2017    Past Surgical History:  Procedure Laterality Date  . NO PAST SURGERIES      There were no vitals filed for this visit.  Subjective Assessment - 08/13/19 1314    Pertinent History  PMH: graves disease, HLD, HTN    How long can you walk comfortably?  walks everyday - 30 minutes in the morning and 30  minutes in the afternoon    Patient Stated Goals  wants to work on walking and resistance work, wants stability exercises. wants to know how to get up off the floor - reports she really needs to work on flexion and ABD    Currently in Pain?  Yes    Pain Location  Shoulder    Pain Orientation  Right    Pain Descriptors / Indicators  Aching    Pain Type  Chronic pain    Pain Onset  More than a month ago    Pain Frequency  Intermittent    Aggravating Factors   malpositioning    Pain Relieving Factors  repositioning            [facility] Adult PT Treatment/Exercise - 08/13/19 1322      Transfers   Transfers  Stand to Sit;Sit to Stand;Floor to Transfer    Sit to Stand  5: Supervision    Stand to Sit  5: Supervision    Floor to Transfer  4: Min guard    Floor to Transfer Details (indicate cue type and reason)  pt needed step by step cues for technique to get on red mat on floor and back up with UE assist on mat table.       Ambulation/Gait   Ambulation/Gait  Yes    Ambulation/Gait Assistance  5: Supervision;4: Min guard    Ambulation/Gait Assistance Details  supervision with cane to enter/exit gym. min guard assist with no device/brace only for gait in session with cues for increased stance time on right LE and increased step length with left LE.     Ambulation Distance (Feet)  30 Feet   x2 brace only/no cane   Assistive device  Straight cane;None    Gait Pattern  Step-through pattern;Decreased stance time - right;Decreased step length - left;Decreased hip/knee flexion - right;Decreased dorsiflexion - right;Decreased weight shift to right;Poor foot clearance - right;Right hip hike    Ambulation Surface  Level;Indoor      Neuro Re-ed    Neuro Re-ed Details   for strengthening/muscle re-ed: tall kneeling on red mat- mini squats x 10 in midline, then 10 with right lateral bias; with green band around knees- lateral stepping out/in, then fwd/bwd for 10 reps each; with left foot forward for  half kneeling- lifting right knee up off mat for LE extension/then back down for 10 reps. cues needed for form/technique with all ex's, UE support on mat table and assist for right foot clearance with movements of right LE.           Balance Exercises - 08/13/19 1336      Balance Exercises: Standing   Standing Eyes Closed  Wide (BOA);Head turns;Foam/compliant surface;Other reps (comment);30 secs;Limitations    Standing Eyes Closed Limitations  standing across blue foam beam: feet hip width apart for EC no head movements, progressing to EC head movements left<>right and up<>down. min guard to min assist for balance.     Balance Beam  standing across blue foam beam: alternating fwd stepping to floor/back onto beam, then alternating bwd stepping to floor/back onto beam, 10 reps each with each leg, cues for increased step length/height. no UE support with occasional touch to bars, min assist for balance.     Tandem Gait  Forward;Retro;Upper extremity support;3 reps;Limitations    Tandem Gait Limitations  on blue foam beam- cues on form and technique    Sidestepping  Foam/compliant support;Upper extremity support;3 reps;Limitations    Sidestepping Limitations  on blue foam beam- cues on form and technique          PT Short Term Goals - 07/19/19 1127      PT SHORT TERM GOAL #1   Title  The patient will be able to perform HEP mod indep for LE strength (ankle isolated control, hamstring, hip extensors). ALL STGS DUE 07/18/19    Baseline  pt reporting performing HEP    Time  7   7 weeks due to delay in scheduling.   Period  Weeks    Status  Achieved    Target Date  07/18/19      PT SHORT TERM GOAL #2   Title  Patient will improve BERG balance score to at leas a 44/56 in order to demo improved balance.    Baseline  46/56 on 07/19/19    Time  7    Period  Weeks    Status  Achieved      PT SHORT TERM GOAL #3   Title  Patient will ambulate at least 500' outdoors with mod I in order to demo  improved endurance for functional gait.    Baseline  unable to assess today due to weather.    Time  7    Period  Weeks    Status  Deferred      PT SHORT TERM  GOAL #4   Title  Patient will decrease TUG to 16 seconds or less with SPC in order to demo decr fall risk.    Baseline  16.75 seconds on 07/19/19    Status  Not Met        PT Long Term Goals - 08/06/19 1218      PT LONG TERM GOAL #1   Title  The patient will negotiate a curb with SPC with supervision. ALL LTGS DUE 08/27/19    Baseline  min guard ascending, supervision descending with SPC.    Time  3    Period  Weeks    Status  Revised    Target Date  08/27/19      PT LONG TERM GOAL #2   Title  The patient will move floor<>stand with mod indep using L UE support on surface.    Baseline  perform floor <> stand 3 reps today - initial min A, and remaining 2 reps are min guard/supervision.    Time  3    Period  Weeks    Status  On-going      PT LONG TERM GOAL #3   Title  Pt will improve gait speed with SPC to at least 1.85 ft/sec to demo decr fall risk.    Baseline  18.71 seconds with SPC = 1.75 ft/sec, 18.31 seconds with no AD = 1.79 ft/sec.    Time  3    Period  Weeks    Status  Revised      PT LONG TERM GOAL #5   Title  Patient will be independent with final HEP in order to build upon functional gains made in therapy.    Baseline  pt reports performing at home and is walking, will need further review and potential additons    Time  3    Period  Weeks    Status  On-going            Plan - 08/13/19 1318    Clinical Impression Statement  Today's skilled session continued to focus on LE strengthening, gait mechanics with brace only and balance reactions. The pt is progressing toward goals and should benefit from contineud PT to progress toward unmet goals.    Personal Factors and Comorbidities  Comorbidity 3+;Past/Current Experience;Time since onset of injury/illness/exacerbation    Comorbidities  graves disease,  HLD, HTN    Examination-Activity Limitations  Stairs;Transfers;Locomotion Level    Examination-Participation Restrictions  Community Activity    Stability/Clinical Decision Making  Stable/Uncomplicated    Rehab Potential  Good    PT Frequency  2x / week   1-2x a week   PT Duration  3 weeks    PT Treatment/Interventions  ADLs/Self Care Home Management;Aquatic Therapy;Electrical Stimulation;Therapeutic exercise;Therapeutic activities;Functional mobility training;Stair training;Gait training;DME Instruction;Balance training;Neuromuscular re-education;Patient/family education;Orthotic Fit/Training;Energy conservation;Passive range of motion    PT Next Visit Plan  curb training. review HEP (add as needed for R weight shift)  floor <> mat transfers, tall kneeling and half kneeling activities, weight shifting to [place] and Agree with Plan of Care  Patient       Patient will benefit from skilled therapeutic intervention in order to improve the following deficits and impairments:  Abnormal gait, Decreased balance, Decreased activity tolerance, Decreased coordination, Decreased endurance, Decreased range of motion, Difficulty walking, Decreased strength, Impaired tone, Pain  Visit Diagnosis: Hemiparesis of right dominant side as late effect of cerebral infarction (  [facility])  Muscle weakness (generalized)  Other abnormalities of gait and mobility  Unsteadiness on feet  Other symptoms and signs involving the nervous system     Problem List Patient Active Problem List   Diagnosis Date Noted  . Hematuria 05/31/2019  . Encounter for chronic pain management 01/22/2019  . Right spastic hemiplegia ([place]) 11/27/2018  . Elevated LFTs 11/20/2018  . 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 Unintentional weight loss 11/05/2018  . Fatigue 11/05/2018  . Bloody stool 11/05/2018  . Polyarthralgia 11/05/2018  .  Chronic anticoagulation 11/05/2018  . Difficulty coping with disease 03/31/2018  . Hemiparesis of right dominant side as late effect of cerebral infarction ([place]) 09/17/2017  . Cardioembolic stroke ([place]) 16/24/4695  . 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 Cardiomyopathy ([place]) 09/17/2017  . H/O ischemic left MCA stroke 09/17/2017  . Cardiac LV ejection fraction 21-30%   . Graves disease   . Plaque psoriasis   . Neuropathy 05/19/2017    Willow Ora, PTA, [facility] [address], [place], [place] [address] [phone] 08/13/19, 5:57 PM  Name: Margaret Ortiz MRN: [id number] Date of Birth: 1961/06/15

## 2019-08-13 NOTE — Patient Instructions (Signed)
Lateral Weight Shift: Upper Trunk Leading   Sit with feet flat on floor. Bring ____ shoulder, head and arm toward side then sit upright, whift weight to other side. Hold __10__ seconds. Return to upright position by pushing through arm Repeat _10-20___ times per session. Do __2__ sessions per day.

## 2019-08-17 ENCOUNTER — Ambulatory Visit: Payer: BLUE CROSS/BLUE SHIELD | Admitting: Physical Therapy

## 2019-08-17 ENCOUNTER — Other Ambulatory Visit: Payer: Self-pay

## 2019-08-17 ENCOUNTER — Encounter: Payer: Self-pay | Admitting: Physical Therapy

## 2019-08-17 ENCOUNTER — Ambulatory Visit: Payer: BLUE CROSS/BLUE SHIELD | Admitting: Occupational Therapy

## 2019-08-17 DIAGNOSIS — M6281 Muscle weakness (generalized): Secondary | ICD-10-CM | POA: Diagnosis not present

## 2019-08-17 DIAGNOSIS — I69351 Hemiplegia and hemiparesis following cerebral infarction affecting right dominant side: Secondary | ICD-10-CM

## 2019-08-17 DIAGNOSIS — R2681 Unsteadiness on feet: Secondary | ICD-10-CM

## 2019-08-17 DIAGNOSIS — R2689 Other abnormalities of gait and mobility: Secondary | ICD-10-CM

## 2019-08-18 NOTE — Therapy (Signed)
[facility] [address] [place], [place], [address] Phone: [phone]   Fax:  [phone]  Occupational Therapy Treatment  Patient Details  Name: Margaret Ortiz MRN: [id number] Date of Birth: 1961/01/06 No data recorded  Encounter Date: 08/17/2019  OT End of Session - 08/17/19 1323    Visit Number  6    Number of Visits  11    Date for OT Re-Evaluation  08/28/19    Authorization Type  BCBS    OT Start Time  1320    OT Stop Time  1355    OT Time Calculation (min)  35 min    Activity Tolerance  Patient tolerated treatment well    Behavior During Therapy  [facility] for tasks assessed/performed       Past Medical History:  Diagnosis Date  . Aphasia 09/17/2017  . 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  . Cardiac LV ejection fraction 21-30%   . Cardioembolic stroke ([place]) [id number]   Right hemiparesis  . Cardiomyopathy ([place]) 04/2017  . Dysphagia 05/19/2017   Last known diet upgraded to regular diet with thin liquids on 06/27/2017, no records available  . Dysphagia as late effect of cerebral aneurysm 09/17/2017  . Fall 09/17/2017  . Graves disease   . H/O ischemic left MCA stroke 09/17/2017  . Hemiparesis of right dominant side as late effect of cerebral infarction ([place]) 09/17/2017  . Hyperlipidemia   . Hypertension   . Neuropathy 05/19/2017   s/p stroke  . Plaque psoriasis   . Shoulder subluxation, right, initial encounter 09/17/2017  . Weakness 09/17/2017    Past Surgical History:  Procedure Laterality Date  . NO PAST SURGERIES      There were no vitals filed for this visit.  Subjective Assessment - 08/18/19 1608    Currently in Pain?  Yes    Pain Score  4     Pain Location  Shoulder    Pain Orientation  Right    Pain Descriptors / Indicators  Aching    Pain Type  Chronic pain    Pain Onset  More than a month ago    Pain  Frequency  Constant    Aggravating Factors   malpositioning    Pain Relieving Factors  repositioning, exercise             Treatment: sidelying scapular mobilization to right scapula followed by supine self ROM shoulder flexion. Seated reach for the floor for gentle self stretch.  Weightbearing through RUE edge of mat with body on arm movements, min facilitation. Standing at mat to roll medium ball forwards and back for self ROM shoulder flexion. Pt practiced using RUE to hold bottles while opening with LUE, min v.c to avoid compensation.                OT Short Term Goals - 12/02/18 1322      OT SHORT TERM GOAL #1   Title  Pt/family independent with updated HEP for RUE-- check STGs 08/28/18    Status  Achieved      OT SHORT TERM GOAL #2   Title  Pt will report R shoulder/neck pain less than or equal to 5/10 for functional tasks.    Status  Achieved      OT SHORT TERM GOAL #3   Title  Pt will verbalize understanding of proper positioning of RUE to decr pain.  Status  Achieved        OT Long Term Goals - 08/18/19 1607      OT LONG TERM GOAL #1   Title  Pt will perform simple home maintenance tasks mod I incorporating RUE as stabilizer or using hand-over-hand techniques when able.    Time  5    Period  Weeks    Status  On-going      OT LONG TERM GOAL #2   Title  Pt will report R shoulder pain less than or equal to 3/10 for ADLs.    Time  5    Period  Weeks    Status  On-going   5/10     OT LONG TERM GOAL #3   Title  I with updated HEP.    Time  5    Period  Weeks    Status  On-going      OT LONG TERM GOAL #4   Title  Pt will verbalize/ demonstrate understanding of positioning to minimize pain and risk for RUE injury.    Time  5    Period  Weeks    Status  On-going            Plan - 08/18/19 1605    Clinical Impression Statement  Pt is progressing slowly towards goals. She demonstrates poor overall awareness and asks each visit when the  movement will return to [place]. Therapist has reinforced that ift is unlikely that she will recover motor function in RUE and that the goal of therapy is to maintin ROM and decrease pain..    Occupational performance deficits (Please refer to evaluation for details):  ADL's;IADL's;Leisure;Work;Social Participation    Body Structure / Function / Physical Skills  ADL;Balance;Body mechanics;Pain;ROM;UE functional use;Tone;IADL;GMC;FMC;Coordination;Sensation;Strength;Dexterity;Decreased knowledge of precautions    Rehab Potential  Good    OT Frequency  2x / week    OT Duration  --   5 weeks plus eval   OT Treatment/Interventions  Self-care/ADL training;Electrical Stimulation;Therapeutic exercise;Patient/family education;Splinting;Neuromuscular education;Paraffin;Moist Heat;Fluidtherapy;Energy conservation;Therapist, nutritional;Therapeutic activities;Cryotherapy;Ultrasound;DME and/or AE instruction;Manual Therapy;Passive range of motion;Cognitive remediation/compensation;Balance training;Aquatic Therapy    Plan  continue to reinforce proper positioning and gentle ROM    Consulted and Agree with Plan of Care  Patient       Patient will benefit from skilled therapeutic intervention in order to improve the following deficits and impairments:   Body Structure / Function / Physical Skills: ADL, Balance, Body mechanics, Pain, ROM, UE functional use, Tone, IADL, GMC, FMC, Coordination, Sensation, Strength, Dexterity, Decreased knowledge of precautions       Visit Diagnosis: Hemiparesis of right dominant side as late effect of cerebral infarction (HCC)  Muscle weakness (generalized)  Other abnormalities of gait and mobility    Problem List Patient Active Problem List   Diagnosis Date Noted  . Hematuria 05/31/2019  . Encounter for chronic pain management 01/22/2019  . Right spastic hemiplegia ([place]) 11/27/2018  . Elevated LFTs 11/20/2018  . 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 Unintentional weight loss 11/05/2018  . Fatigue 11/05/2018  . Bloody stool 11/05/2018  . Polyarthralgia 11/05/2018  . Chronic anticoagulation 11/05/2018  . Difficulty coping with disease 03/31/2018  . Hemiparesis of right dominant side as late effect of cerebral infarction ([place]) 09/17/2017  . Cardioembolic stroke ([place]) [id number]  . 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 Cardiomyopathy ([place]) 09/17/2017  . H/O ischemic left MCA stroke 09/17/2017  . Cardiac LV  ejection fraction 21-30%   . Graves disease   . Plaque psoriasis   . Neuropathy 05/19/2017    Roi Jafari 08/18/2019, 4:09 PM  [facility] [address] [place], [place], [address] Phone: [phone]   Fax:  [phone]  Name: Magdala Ratliff MRN: [id number] Date of Birth: May 04, 1961

## 2019-08-18 NOTE — Therapy (Signed)
[facility] [address] [place], [place], [address] Phone: [phone]   Fax:  [phone]  Physical Therapy Treatment  Patient Details  Name: Margaret Ortiz MRN: [id number] Date of Birth: 13-Feb-1961 Referring Provider (PT): Howard Pouch   Encounter Date: 08/17/2019     08/17/19 1238  PT Visits / Re-Eval  Visit Number 11  Number of Visits 13  Date for PT Re-Evaluation 09/05/19  Authorization  Authorization Type BCBS  PT Time Calculation  PT Start Time 1233  PT Stop Time 1314  PT Time Calculation (min) 41 min  PT - End of Session  Equipment Utilized During Treatment Gait belt  Activity Tolerance Patient tolerated treatment well  Behavior During Therapy [facility] for tasks assessed/performed    Past Medical History:  Diagnosis Date  . Aphasia 09/17/2017  . 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  . Cardiac LV ejection fraction 21-30%   . Cardioembolic stroke ([place]) 01/65/5374   Right hemiparesis  . Cardiomyopathy ([place]) 04/2017  . Dysphagia 05/19/2017   Last known diet upgraded to regular diet with thin liquids on 06/27/2017, no records available  . 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 Fall 09/17/2017  . Graves disease   . H/O ischemic left MCA stroke 09/17/2017  . Hemiparesis of right dominant side as late effect of cerebral infarction ([place]) 09/17/2017  . Hyperlipidemia   . Hypertension   . Neuropathy 05/19/2017   s/p stroke  . Plaque psoriasis   . Shoulder subluxation, right, initial encounter 09/17/2017  . Weakness 09/17/2017    Past Surgical History:  Procedure Laterality Date  . NO PAST SURGERIES      There were no vitals filed for this visit.     08/17/19 1236  Symptoms/Limitations  Subjective No falls. Does feel the Gabapentin makes her very tired. Reports the HEP is going okay at home,  limited due no help at home.  How long can you walk comfortably? walks everyday - 30 minutes in the morning and 30 minutes in the afternoon  Patient Stated Goals wants to work on walking and resistance work, wants stability exercises. wants to know how to get up off the floor - reports she really needs to work on flexion and ABD  Pain Assessment  Currently in Pain? Yes  Pain Score 4  Pain Location Shoulder  Pain Orientation Right  Pain Descriptors / Indicators Aching  Pain Type Chronic pain  Pain Onset More than a month ago  Pain Frequency Intermittent  Aggravating Factors  malpositioning  Pain Relieving Factors repositioning      08/17/19 1240  Transfers  Transfers Sit to Stand;Stand to Sit  Sit to Stand 5: Supervision;With upper extremity assist;From bed;From chair/3-in-1  Stand to Sit 5: Supervision;With upper extremity assist;To bed;To chair/3-in-1  Ambulation/Gait  Ambulation/Gait Yes  Ambulation/Gait Assistance 5: Supervision;4: Min guard  Ambulation/Gait Assistance Details mostly supervision, min guard assist for balance with outdoor decline x1.   Ambulation Distance (Feet) 500 Feet (x1)  Assistive device Straight cane (right AFO)  Gait Pattern Step-through pattern;Decreased stance time - right;Decreased step length - left;Decreased hip/knee flexion - right;Decreased dorsiflexion - right;Decreased weight shift to right;Poor foot clearance - right;Right hip hike  Ambulation Surface Level;Indoor;Unlevel;Outdoor;Paved  Curb Other (comment) (min guard)  Curb Details (indicate cue type and reason) x3 reps with cane/AFO on outdoor curb with cues to slow down, weight shift and for  step through after descent or ascent.   High Level Balance  High Level Balance Activities Side stepping;Marching forwards;Marching backwards;Tandem walking (tandem fwd/bwd)  High Level Balance Comments on blue mat in parallel bars: 3 laps each with no UE support. cues on ex form/technique and weight  shifting.   Self-Care  Self-Care Other Self-Care Comments  Self-Care comments Reinforced discussion from previous sessions about PT wrapping up next week after pt mentioned trying to get HHPT. Discussed that she would not qualify for this as she has been getting OPPT. Pt then stating can she just continue with OPPT. Discussed need for progress, plateau in progress and the ultimate goal for her to work on her own outside of PT. Pt reluctantly verbalizing understanding.        Balance Exercises - 08/17/19 1312      Balance Exercises: Standing   Rockerboard  Anterior/posterior;Lateral;EC;30 seconds;Other reps (comment);Limitations    Rockerboard Limitations  both ways holding board steady: EC no UE support/no head movements with cues on posture/weight shifting for balance, min guard to min assist for balance.   (Pended)           PT Short Term Goals - 07/19/19 1127      PT SHORT TERM GOAL #1   Title  The patient will be able to perform HEP mod indep for LE strength (ankle isolated control, hamstring, hip extensors). ALL STGS DUE 07/18/19    Baseline  pt reporting performing HEP    Time  7   7 weeks due to delay in scheduling.   Period  Weeks    Status  Achieved    Target Date  07/18/19      PT SHORT TERM GOAL #2   Title  Patient will improve BERG balance score to at leas a 44/56 in order to demo improved balance.    Baseline  46/56 on 07/19/19    Time  7    Period  Weeks    Status  Achieved      PT SHORT TERM GOAL #3   Title  Patient will ambulate at least 500' outdoors with mod I in order to demo improved endurance for functional gait.    Baseline  unable to assess today due to weather.    Time  7    Period  Weeks    Status  Deferred      PT SHORT TERM GOAL #4   Title  Patient will decrease TUG to 16 seconds or less with SPC in order to demo decr fall risk.    Baseline  16.75 seconds on 07/19/19    Status  Not Met        PT Long Term Goals - 08/06/19 1218      PT  LONG TERM GOAL #1   Title  The patient will negotiate a curb with SPC with supervision. ALL LTGS DUE 08/27/19    Baseline  min guard ascending, supervision descending with SPC.    Time  3    Period  Weeks    Status  Revised    Target Date  08/27/19      PT LONG TERM GOAL #2   Title  The patient will move floor<>stand with mod indep using L UE support on surface.    Baseline  perform floor <> stand 3 reps today - initial min A, and remaining 2 reps are min guard/supervision.    Time  3    Period  Weeks    Status  On-going      PT LONG TERM GOAL #3   Title  Pt will improve gait speed with SPC to at least 1.85 ft/sec to demo decr fall risk.    Baseline  18.71 seconds with SPC = 1.75 ft/sec, 18.31 seconds with no AD = 1.79 ft/sec.    Time  3    Period  Weeks    Status  Revised      PT LONG TERM GOAL #5   Title  Patient will be independent with final HEP in order to build upon functional gains made in therapy.    Baseline  pt reports performing at home and is walking, will need further review and potential additons    Time  3    Period  Weeks    Status  On-going         08/17/19 1239  Plan  Clinical Impression Statement Today's skilled session continued to focus on gait on multiple surfaces, curbs and balance reactions with up to min assist for balance. Reinforced discussion from previous sessions about PT wrapping up next week after pt mentioned trying to get HHPT. Discussed that she would not qualify for this as she has been getting OPPT. Pt then stating can she just continue with OPPT. Discussed need for progress, plateau in progress and the ultimate goal for her to work on her own outside of PT. Pt reluctantly verbalizing understanding. Plan is to wrap up PT in next 2 sessions.  Personal Factors and Comorbidities Comorbidity 3+;Past/Current Experience;Time since onset of injury/illness/exacerbation  Comorbidities graves disease, HLD, HTN  Examination-Activity Limitations  Stairs;Transfers;Locomotion Level  Examination-Participation Restrictions Community Activity  Pt will benefit from skilled therapeutic intervention in order to improve on the following deficits Abnormal gait;Decreased balance;Decreased activity tolerance;Decreased coordination;Decreased endurance;Decreased range of motion;Difficulty walking;Decreased strength;Impaired tone;Pain  Stability/Clinical Decision Making Stable/Uncomplicated  Rehab Potential Good  PT Frequency 2x / week (1-2x a week)  PT Duration 3 weeks  PT Treatment/Interventions ADLs/Self Care Home Management;Aquatic Therapy;Electrical Stimulation;Therapeutic exercise;Therapeutic activities;Functional mobility training;Stair training;Gait training;DME Instruction;Balance training;Neuromuscular re-education;Patient/family education;Orthotic Fit/Training;Energy conservation;Passive range of motion  PT Next Visit Plan revise HEP, begin to look at LTGs for anticipated discharge in next 2 sessions.  PT Home Exercise Plan [id number]  Consulted and Agree with Plan of Care Patient          Patient will benefit from skilled therapeutic intervention in order to improve the following deficits and impairments:  Abnormal gait, Decreased balance, Decreased activity tolerance, Decreased coordination, Decreased endurance, Decreased range of motion, Difficulty walking, Decreased strength, Impaired tone, Pain  Visit Diagnosis: Hemiparesis of right dominant side as late effect of cerebral infarction (HCC)  Muscle weakness (generalized)  Other abnormalities of gait and mobility  Unsteadiness on feet     Problem List Patient Active Problem List   Diagnosis Date Noted  . Hematuria 05/31/2019  . Encounter for chronic pain management 01/22/2019  . Right spastic hemiplegia ([place]) 11/27/2018  . Elevated LFTs 11/20/2018  . 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 Unintentional weight loss  11/05/2018  . Fatigue 11/05/2018  . Bloody stool 11/05/2018  . Polyarthralgia 11/05/2018  . Chronic anticoagulation 11/05/2018  . Difficulty coping with disease 03/31/2018  . Hemiparesis of right dominant side as late effect of cerebral infarction ([place]) 09/17/2017  . Cardioembolic stroke ([place]) 76/72/0947  . 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 Cardiomyopathy ([place]) 09/17/2017  . H/O ischemic left MCA stroke 09/17/2017  .  Cardiac LV ejection fraction 21-30%   . Graves disease   . Plaque psoriasis   . Neuropathy 05/19/2017    Willow Ora, PTA, [facility] [address] [place], [place] [address] [phone] 08/18/19, 8:58 PM   Name: Margaret Ortiz MRN: [id number] Date of Birth: 01-13-1961

## 2019-08-19 ENCOUNTER — Ambulatory Visit: Payer: BLUE CROSS/BLUE SHIELD | Admitting: Physical Therapy

## 2019-08-19 ENCOUNTER — Ambulatory Visit: Payer: BLUE CROSS/BLUE SHIELD | Admitting: Occupational Therapy

## 2019-08-19 ENCOUNTER — Other Ambulatory Visit: Payer: Self-pay

## 2019-08-19 ENCOUNTER — Encounter: Payer: Self-pay | Admitting: Physical Therapy

## 2019-08-19 DIAGNOSIS — M6281 Muscle weakness (generalized): Secondary | ICD-10-CM | POA: Diagnosis not present

## 2019-08-19 DIAGNOSIS — R2681 Unsteadiness on feet: Secondary | ICD-10-CM

## 2019-08-19 DIAGNOSIS — I69318 Other symptoms and signs involving cognitive functions following cerebral infarction: Secondary | ICD-10-CM

## 2019-08-19 DIAGNOSIS — R278 Other lack of coordination: Secondary | ICD-10-CM

## 2019-08-19 DIAGNOSIS — R29818 Other symptoms and signs involving the nervous system: Secondary | ICD-10-CM

## 2019-08-19 DIAGNOSIS — G8929 Other chronic pain: Secondary | ICD-10-CM

## 2019-08-19 DIAGNOSIS — I69351 Hemiplegia and hemiparesis following cerebral infarction affecting right dominant side: Secondary | ICD-10-CM

## 2019-08-19 DIAGNOSIS — R2689 Other abnormalities of gait and mobility: Secondary | ICD-10-CM

## 2019-08-19 DIAGNOSIS — M25511 Pain in right shoulder: Secondary | ICD-10-CM

## 2019-08-19 NOTE — Therapy (Signed)
[facility] [address] [place], [place], [address] Phone: [phone]   Fax:  [phone]  Occupational Therapy Treatment  Patient Details  Name: Margaret Ortiz MRN: [id number] Date of Birth: 26-Jan-1961 No data recorded  Encounter Date: 08/19/2019  OT End of Session - 08/19/19 1353    Visit Number  7    Number of Visits  11    Date for OT Re-Evaluation  08/28/19    Authorization Type  BCBS    OT Start Time  1020    OT Stop Time  1100    OT Time Calculation (min)  40 min    Activity Tolerance  Patient tolerated treatment well    Behavior During Therapy  [facility] for tasks assessed/performed       Past Medical History:  Diagnosis Date  . Aphasia 09/17/2017  . 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  . Cardiac LV ejection fraction 21-30%   . Cardioembolic stroke ([place]) [id number]   Right hemiparesis  . Cardiomyopathy ([place]) 04/2017  . Dysphagia 05/19/2017   Last known diet upgraded to regular diet with thin liquids on 06/27/2017, no records available  . 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 Fall 09/17/2017  . Graves disease   . H/O ischemic left MCA stroke 09/17/2017  . Hemiparesis of right dominant side as late effect of cerebral infarction ([place]) 09/17/2017  . Hyperlipidemia   . Hypertension   . Neuropathy 05/19/2017   s/p stroke  . Plaque psoriasis   . Shoulder subluxation, right, initial encounter 09/17/2017  . Weakness 09/17/2017    Past Surgical History:  Procedure Laterality Date  . NO PAST SURGERIES      There were no vitals filed for this visit.            Treatment: splint assessment and minor modifications for improved comfort and fit. Pt brought in her estim unit, therapist  reviewed application and electrode placement and use with patient for wrist extension, (Pt utilized preset  settings and just adjusted intensity. Therapist recommended pt does not use for longer than 15 mins at a time, and she should use sub maximal intensity to minimize risk for injury. Pt returned demonstration. Discussed with pt that she soul wear give Mohr sling when walking for long time periods but she does not need to wear in her home. Seated low range shoulder flexion rollling ball on floor then picking up ball to lap with bilateral UE's. Pt practiced sweeping while incorporating her RUE as an assist/ stabilizer. Standing to roll ball along mat with gentle shoulder flexion strtech, min v.c Weightbearing through mat with RUE, with body on arm movements, min-mod facilitation.              OT Short Term Goals - 12/02/18 1322      OT SHORT TERM GOAL #1   Title  Pt/family independent with updated HEP for RUE-- check STGs 08/28/18    Status  Achieved      OT SHORT TERM GOAL #2   Title  Pt will report R shoulder/neck pain less than or equal to 5/10 for functional tasks.    Status  Achieved      OT SHORT TERM GOAL #3   Title  Pt will verbalize understanding of proper positioning of RUE to decr pain.    Status  Achieved  OT Long Term Goals - 08/18/19 1607      OT LONG TERM GOAL #1   Title  Pt will perform simple home maintenance tasks mod I incorporating RUE as stabilizer or using hand-over-hand techniques when able.    Time  5    Period  Weeks    Status  On-going      OT LONG TERM GOAL #2   Title  Pt will report R shoulder pain less than or equal to 3/10 for ADLs.    Time  5    Period  Weeks    Status  On-going   5/10     OT LONG TERM GOAL #3   Title  I with updated HEP.    Time  5    Period  Weeks    Status  On-going      OT LONG TERM GOAL #4   Title  Pt will verbalize/ demonstrate understanding of positioning to minimize pain and risk for RUE injury.    Time  5    Period  Weeks    Status  On-going              Patient will benefit from skilled  therapeutic intervention in order to improve the following deficits and impairments:           Visit Diagnosis: Hemiparesis of right dominant side as late effect of cerebral infarction (HCC)  Muscle weakness (generalized)  Other abnormalities of gait and mobility  Other symptoms and signs involving the nervous system  Other lack of coordination  Chronic right shoulder pain  Other symptoms and signs involving cognitive functions following cerebral infarction    Problem List Patient Active Problem List   Diagnosis Date Noted  . Hematuria 05/31/2019  . Encounter for chronic pain management 01/22/2019  . Right spastic hemiplegia ([place]) 11/27/2018  . Elevated LFTs 11/20/2018  . 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 Unintentional weight loss 11/05/2018  . Fatigue 11/05/2018  . Bloody stool 11/05/2018  . Polyarthralgia 11/05/2018  . Chronic anticoagulation 11/05/2018  . Difficulty coping with disease 03/31/2018  . Hemiparesis of right dominant side as late effect of cerebral infarction ([place]) 09/17/2017  . Cardioembolic stroke ([place]) [id number]  . 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 Cardiomyopathy ([place]) 09/17/2017  . H/O ischemic left MCA stroke 09/17/2017  . Cardiac LV ejection fraction 21-30%   . Graves disease   . Plaque psoriasis   . Neuropathy 05/19/2017    Amayia Ciano 08/19/2019, 1:54 PM  [facility] [address] [place], [place], [address] Phone: [phone]   Fax:  [phone]  Name: Arnette Pello MRN: [id number] Date of Birth: Apr 05, 1961

## 2019-08-20 NOTE — Therapy (Signed)
[facility] [address] [place], [place], [address] Phone: [phone]   Fax:  [phone]  Physical Therapy Treatment  Patient Details  Name: Ramey Ketcherside MRN: [id number] Date of Birth: 1961-01-29 Referring Provider (PT): Howard Pouch   Encounter Date: 08/19/2019  PT End of Session - 08/19/19 0938    Visit Number  12    Number of Visits  13    Date for PT Re-Evaluation  09/05/19    Authorization Type  BCBS    PT Start Time  0932    PT Stop Time  1013    PT Time Calculation (min)  41 min    Equipment Utilized During Treatment  Gait belt    Activity Tolerance  Patient tolerated treatment well    Behavior During Therapy  [facility] for tasks assessed/performed       Past Medical History:  Diagnosis Date  . Aphasia 09/17/2017  . 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  . Cardiac LV ejection fraction 21-30%   . Cardioembolic stroke ([place]) 82/42/3536   Right hemiparesis  . Cardiomyopathy ([place]) 04/2017  . Dysphagia 05/19/2017   Last known diet upgraded to regular diet with thin liquids on 06/27/2017, no records available  . 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 Fall 09/17/2017  . Graves disease   . H/O ischemic left MCA stroke 09/17/2017  . Hemiparesis of right dominant side as late effect of cerebral infarction ([place]) 09/17/2017  . Hyperlipidemia   . Hypertension   . Neuropathy 05/19/2017   s/p stroke  . Plaque psoriasis   . Shoulder subluxation, right, initial encounter 09/17/2017  . Weakness 09/17/2017    Past Surgical History:  Procedure Laterality Date  . NO PAST SURGERIES      There were no vitals filed for this visit.  Subjective Assessment - 08/19/19 0935    Subjective  Reports her right LE is tight today. No falls. Does have a little headache today, 3/10. No change in shoulder pain. Does keep  stating "I'll get it back", deferred this to OT who is addressing shoulder function.    Pertinent History  PMH: graves disease, HLD, HTN    How long can you walk comfortably?  walks everyday - 30 minutes in the morning and 30 minutes in the afternoon    Currently in Pain?  Yes    Pain Score  3     Pain Location  Shoulder    Pain Orientation  Right    Pain Descriptors / Indicators  Aching    Pain Type  Acute pain;Chronic pain    Pain Onset  More than a month ago    Pain Frequency  Constant    Multiple Pain Sites  Yes    Pain Score  3    Pain Location  Head    Pain Descriptors / Indicators  Headache    Pain Onset  Today    Pain Frequency  Occasional    Aggravating Factors   unsure    Pain Relieving Factors  medication           [facility] Adult PT Treatment/Exercise - 08/19/19 0939      Ambulation/Gait   Ambulation/Gait  Yes    Ambulation/Gait Assistance  5: Supervision    Ambulation/Gait Assistance Details  no imbalance noted or assistance needed.     Ambulation Distance (Feet)  500 Feet  Assistive device  Straight cane   right AFO   Gait Pattern  Step-through pattern;Decreased stance time - right;Decreased step length - left;Decreased hip/knee flexion - right;Decreased dorsiflexion - right;Decreased weight shift to right;Poor foot clearance - right;Right hip hike    Ambulation Surface  Level;Indoor    Curb  Other (comment)   min guard assist   Curb Details (indicate cue type and reason)  x2 reps with cane on outdoor curb, less cues needed this session vs with previous session.       Exercises   Exercises  Other Exercises    Other Exercises   Scifit LE's only for 6 minutes at level 4.0 with goal >/= 40 rpm for stengthening/activity tolerance      Knee/Hip Exercises: Stretches   Active Hamstring Stretch  Right;3 reps;30 seconds;Limitations    Active Hamstring Stretch Limitations  seated at edge of mat with cues on form and technique.           Balance Exercises -  08/19/19 1000      Balance Exercises: Standing   Other Standing Exercises  staggered stance: forward foot on 1 inch foam/back foot on floor for EC for 30 sec's x 3 reps each foot forward. Up to min assist for balance with cues on posture and weight shifting to assist with balance.          PT Short Term Goals - 07/19/19 1127      PT SHORT TERM GOAL #1   Title  The patient will be able to perform HEP mod indep for LE strength (ankle isolated control, hamstring, hip extensors). ALL STGS DUE 07/18/19    Baseline  pt reporting performing HEP    Time  7   7 weeks due to delay in scheduling.   Period  Weeks    Status  Achieved    Target Date  07/18/19      PT SHORT TERM GOAL #2   Title  Patient will improve BERG balance score to at leas a 44/56 in order to demo improved balance.    Baseline  46/56 on 07/19/19    Time  7    Period  Weeks    Status  Achieved      PT SHORT TERM GOAL #3   Title  Patient will ambulate at least 500' outdoors with mod I in order to demo improved endurance for functional gait.    Baseline  unable to assess today due to weather.    Time  7    Period  Weeks    Status  Deferred      PT SHORT TERM GOAL #4   Title  Patient will decrease TUG to 16 seconds or less with SPC in order to demo decr fall risk.    Baseline  16.75 seconds on 07/19/19    Status  Not Met        PT Long Term Goals - 08/06/19 1218      PT LONG TERM GOAL #1   Title  The patient will negotiate a curb with SPC with supervision. ALL LTGS DUE 08/27/19    Baseline  min guard ascending, supervision descending with SPC.    Time  3    Period  Weeks    Status  Revised    Target Date  08/27/19      PT LONG TERM GOAL #2   Title  The patient will move floor<>stand with mod indep using L UE support on surface.  Baseline  perform floor <> stand 3 reps today - initial min A, and remaining 2 reps are min guard/supervision.    Time  3    Period  Weeks    Status  On-going      PT LONG TERM  GOAL #3   Title  Pt will improve gait speed with SPC to at least 1.85 ft/sec to demo decr fall risk.    Baseline  18.71 seconds with SPC = 1.75 ft/sec, 18.31 seconds with no AD = 1.79 ft/sec.    Time  3    Period  Weeks    Status  Revised      PT LONG TERM GOAL #5   Title  Patient will be independent with final HEP in order to build upon functional gains made in therapy.    Baseline  pt reports performing at home and is walking, will need further review and potential additons    Time  3    Period  Weeks    Status  On-going            Plan - 08/19/19 0939    Clinical Impression Statement  Today's skilled session continued to address gait/curbs with cane, strengthening and balance reactions with rest breaks taken as needed. No other issues reported by pt in session.    Personal Factors and Comorbidities  Comorbidity 3+;Past/Current Experience;Time since onset of injury/illness/exacerbation    Comorbidities  graves disease, HLD, HTN    Examination-Activity Limitations  Stairs;Transfers;Locomotion Level    Examination-Participation Restrictions  Community Activity    Stability/Clinical Decision Making  Stable/Uncomplicated    Rehab Potential  Good    PT Frequency  2x / week   1-2x a week   PT Duration  3 weeks    PT Treatment/Interventions  ADLs/Self Care Home Management;Aquatic Therapy;Electrical Stimulation;Therapeutic exercise;Therapeutic activities;Functional mobility training;Stair training;Gait training;DME Instruction;Balance training;Neuromuscular re-education;Patient/family education;Orthotic Fit/Training;Energy conservation;Passive range of motion    PT Next Visit Plan  assess goals for discharge including HEP revisment as needed    PT [place] and Agree with Plan of Care  Patient       Patient will benefit from skilled therapeutic intervention in order to improve the following deficits and impairments:  Abnormal gait, Decreased balance,  Decreased activity tolerance, Decreased coordination, Decreased endurance, Decreased range of motion, Difficulty walking, Decreased strength, Impaired tone, Pain  Visit Diagnosis: Muscle weakness (generalized)  Other abnormalities of gait and mobility  Hemiparesis of right dominant side as late effect of cerebral infarction (HCC)  Unsteadiness on feet     Problem List Patient Active Problem List   Diagnosis Date Noted  . Hematuria 05/31/2019  . Encounter for chronic pain management 01/22/2019  . Right spastic hemiplegia ([place]) 11/27/2018  . Elevated LFTs 11/20/2018  . 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 Unintentional weight loss 11/05/2018  . Fatigue 11/05/2018  . Bloody stool 11/05/2018  . Polyarthralgia 11/05/2018  . Chronic anticoagulation 11/05/2018  . Difficulty coping with disease 03/31/2018  . Hemiparesis of right dominant side as late effect of cerebral infarction ([place]) 09/17/2017  . Cardioembolic stroke ([place]) 61/44/3154  . 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 Cardiomyopathy ([place]) 09/17/2017  . H/O ischemic left MCA stroke 09/17/2017  . Cardiac LV ejection fraction 21-30%   . Graves disease   . Plaque psoriasis   . Neuropathy 05/19/2017    Willow Ora, PTA, CLT  [facility] [address], [place], [place] [address] [phone] 08/20/19, 3:09 PM   Name: Kathe Wirick MRN: [id number] Date of Birth: 14-May-1961

## 2019-08-24 ENCOUNTER — Ambulatory Visit: Payer: BLUE CROSS/BLUE SHIELD | Attending: Family Medicine | Admitting: Occupational Therapy

## 2019-08-24 ENCOUNTER — Other Ambulatory Visit: Payer: Self-pay

## 2019-08-24 DIAGNOSIS — G8929 Other chronic pain: Secondary | ICD-10-CM | POA: Diagnosis present

## 2019-08-24 DIAGNOSIS — I69351 Hemiplegia and hemiparesis following cerebral infarction affecting right dominant side: Secondary | ICD-10-CM

## 2019-08-24 DIAGNOSIS — M25511 Pain in right shoulder: Secondary | ICD-10-CM | POA: Insufficient documentation

## 2019-08-24 DIAGNOSIS — I69318 Other symptoms and signs involving cognitive functions following cerebral infarction: Secondary | ICD-10-CM | POA: Diagnosis present

## 2019-08-24 DIAGNOSIS — M6281 Muscle weakness (generalized): Secondary | ICD-10-CM

## 2019-08-24 DIAGNOSIS — R29818 Other symptoms and signs involving the nervous system: Secondary | ICD-10-CM | POA: Diagnosis present

## 2019-08-24 DIAGNOSIS — R278 Other lack of coordination: Secondary | ICD-10-CM | POA: Diagnosis present

## 2019-08-24 DIAGNOSIS — R2681 Unsteadiness on feet: Secondary | ICD-10-CM | POA: Insufficient documentation

## 2019-08-24 NOTE — Therapy (Signed)
[facility] [address] [place], [place], [address] Phone: [phone]   Fax:  [phone]  Occupational Therapy Treatment  Patient Details  Name: Margaret Ortiz MRN: [id number] Date of Birth: 10-Feb-1961 No data recorded  Encounter Date: 08/24/2019  OT End of Session - 08/24/19 1610    Visit Number  8    Number of Visits  11    Date for OT Re-Evaluation  08/28/19    Authorization Type  BCBS    OT Start Time  [id number]    OT Stop Time  1314    OT Time Calculation (min)  39 min    Activity Tolerance  Patient tolerated treatment well    Behavior During Therapy  [facility] for tasks assessed/performed       Past Medical History:  Diagnosis Date  . Aphasia 09/17/2017  . 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  . Cardiac LV ejection fraction 21-30%   . Cardioembolic stroke ([place]) [id number]   Right hemiparesis  . Cardiomyopathy ([place]) 04/2017  . Dysphagia 05/19/2017   Last known diet upgraded to regular diet with thin liquids on 06/27/2017, no records available  . 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 Fall 09/17/2017  . Graves disease   . H/O ischemic left MCA stroke 09/17/2017  . Hemiparesis of right dominant side as late effect of cerebral infarction ([place]) 09/17/2017  . Hyperlipidemia   . Hypertension   . Neuropathy 05/19/2017   s/p stroke  . Plaque psoriasis   . Shoulder subluxation, right, initial encounter 09/17/2017  . Weakness 09/17/2017    Past Surgical History:  Procedure Laterality Date  . NO PAST SURGERIES      There were no vitals filed for this visit.  Subjective Assessment - 08/24/19 1615    Subjective   Pt reports she had shoulder pain yesterday after overdoing it.    Currently in Pain?  Yes    Pain Score  2     Pain Location  Shoulder    Pain Orientation  Right    Pain Descriptors / Indicators   Aching    Pain Type  Chronic pain    Pain Onset  More than a month ago    Pain Frequency  Intermittent    Aggravating Factors   malpositioning, overuse    Pain Relieving Factors  stretching, proper positioning              Treatment:sidelying to perform gravity eliminated shoulder flexion with scapular facilitation,  Standing shoulder flexion rollling ball table top for gentle stretch then in quadraped on mat, min-mod facilitation/ v.c Weightbearing through mat with RUE, with body on arm movements, min-mod facilitation. Seated gentle tableslides and weightbearing through bilateral elbows, min v.c P/ROM forearm supination / pronation and finger flexion/ extension, therapist reviewed proper techniques with patient                        OT Long Term Goals - 08/18/19 1607      OT LONG TERM GOAL #1   Title  Pt will perform simple home maintenance tasks mod I incorporating RUE as stabilizer or using hand-over-hand techniques when able.    Time  5    Period  Weeks    Status  On-going      OT LONG TERM GOAL #2   Title  Pt will report  R shoulder pain less than or equal to 3/10 for ADLs.    Time  5    Period  Weeks    Status  On-going   5/10     OT LONG TERM GOAL #3   Title  I with updated HEP.    Time  5    Period  Weeks    Status  On-going      OT LONG TERM GOAL #4   Title  Pt will verbalize/ demonstrate understanding of positioning to minimize pain and risk for RUE injury.    Time  5    Period  Weeks    Status  On-going            Plan - 08/24/19 1613    Clinical Impression Statement  Pt is progressing slowly towards goals. She demonstrates poor overall awareness and asks each visit when the movement will return to [place]. Therapist has reinforced that ift is unlikely that she will recover motor function in RUE and that the goal of therapy is to maintin ROM and decrease pain..    Occupational performance deficits (Please refer to evaluation for  details):  ADL's;IADL's;Leisure;Work;Social Participation    Body Structure / Function / Physical Skills  ADL;Balance;Body mechanics;Pain;ROM;UE functional use;Tone;IADL;GMC;FMC;Coordination;Sensation;Strength;Dexterity;Decreased knowledge of precautions    Rehab Potential  Good    OT Frequency  2x / week    OT Duration  --   5 weeks plus eval   OT Treatment/Interventions  Self-care/ADL training;Electrical Stimulation;Therapeutic exercise;Patient/family education;Splinting;Neuromuscular education;Paraffin;Moist Heat;Fluidtherapy;Energy conservation;Therapist, nutritional;Therapeutic activities;Cryotherapy;Ultrasound;DME and/or AE instruction;Manual Therapy;Passive range of motion;Cognitive remediation/compensation;Balance training;Aquatic Therapy    Plan  check goals, anticipate d/c next visit    Consulted and Agree with Plan of Care  Patient       Patient will benefit from skilled therapeutic intervention in order to improve the following deficits and impairments:   Body Structure / Function / Physical Skills: ADL, Balance, Body mechanics, Pain, ROM, UE functional use, Tone, IADL, GMC, FMC, Coordination, Sensation, Strength, Dexterity, Decreased knowledge of precautions       Visit Diagnosis: Muscle weakness (generalized)  Hemiparesis of right dominant side as late effect of cerebral infarction (HCC)  Other symptoms and signs involving the nervous system  Other lack of coordination  Chronic right shoulder pain  Other symptoms and signs involving cognitive functions following cerebral infarction    Problem List Patient Active Problem List   Diagnosis Date Noted  . Hematuria 05/31/2019  . Encounter for chronic pain management 01/22/2019  . Right spastic hemiplegia ([place]) 11/27/2018  . Elevated LFTs 11/20/2018  . 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 Unintentional weight loss 11/05/2018  . Fatigue 11/05/2018  . Bloody  stool 11/05/2018  . Polyarthralgia 11/05/2018  . Chronic anticoagulation 11/05/2018  . Difficulty coping with disease 03/31/2018  . Hemiparesis of right dominant side as late effect of cerebral infarction ([place]) 09/17/2017  . Cardioembolic stroke ([place]) [id number]  . 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 Cardiomyopathy ([place]) 09/17/2017  . H/O ischemic left MCA stroke 09/17/2017  . Cardiac LV ejection fraction 21-30%   . Graves disease   . Plaque psoriasis   . Neuropathy 05/19/2017    Mylin Hirano 08/24/2019, 4:16 PM  [facility] [address] [place], [place], [address] Phone: [phone]   Fax:  [phone]  Name: Margaret Ortiz MRN: [id number] Date of Birth: [birth year]

## 2019-08-26 ENCOUNTER — Other Ambulatory Visit: Payer: Self-pay

## 2019-08-26 ENCOUNTER — Ambulatory Visit: Payer: BLUE CROSS/BLUE SHIELD | Admitting: Occupational Therapy

## 2019-08-26 ENCOUNTER — Ambulatory Visit: Payer: BLUE CROSS/BLUE SHIELD | Admitting: Physical Therapy

## 2019-08-26 DIAGNOSIS — I69318 Other symptoms and signs involving cognitive functions following cerebral infarction: Secondary | ICD-10-CM

## 2019-08-26 DIAGNOSIS — I69351 Hemiplegia and hemiparesis following cerebral infarction affecting right dominant side: Secondary | ICD-10-CM

## 2019-08-26 DIAGNOSIS — R29818 Other symptoms and signs involving the nervous system: Secondary | ICD-10-CM

## 2019-08-26 DIAGNOSIS — M6281 Muscle weakness (generalized): Secondary | ICD-10-CM

## 2019-08-26 DIAGNOSIS — R278 Other lack of coordination: Secondary | ICD-10-CM

## 2019-08-26 DIAGNOSIS — R2681 Unsteadiness on feet: Secondary | ICD-10-CM

## 2019-08-26 DIAGNOSIS — G8929 Other chronic pain: Secondary | ICD-10-CM

## 2019-08-26 NOTE — Therapy (Signed)
[facility] [address] [place], [place], [address] Phone: [phone]   Fax:  [phone]  Physical Therapy Treatment/Discharge Summary  Patient Details  Name: Margaret Ortiz MRN: [id number] Date of Birth: 08-01-1960 Referring Provider (PT): Howard Pouch   Encounter Date: 08/26/2019  PT End of Session - 08/26/19 1211    Visit Number  13    Number of Visits  13    Date for PT Re-Evaluation  09/05/19    Authorization Type  BCBS    PT Start Time  1018    PT Stop Time  1101   5 minutes non billable due to pt using rest room   PT Time Calculation (min)  43 min    Equipment Utilized During Treatment  Gait belt    Activity Tolerance  Patient tolerated treatment well    Behavior During Therapy  [facility] for tasks assessed/performed       Past Medical History:  Diagnosis Date  . Aphasia 09/17/2017  . 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  . Cardiac LV ejection fraction 21-30%   . Cardioembolic stroke ([place]) 16/06/930   Right hemiparesis  . Cardiomyopathy ([place]) 04/2017  . Dysphagia 05/19/2017   Last known diet upgraded to regular diet with thin liquids on 06/27/2017, no records available  . 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 Fall 09/17/2017  . Graves disease   . H/O ischemic left MCA stroke 09/17/2017  . Hemiparesis of right dominant side as late effect of cerebral infarction ([place]) 09/17/2017  . Hyperlipidemia   . Hypertension   . Neuropathy 05/19/2017   s/p stroke  . Plaque psoriasis   . Shoulder subluxation, right, initial encounter 09/17/2017  . Weakness 09/17/2017    Past Surgical History:  Procedure Laterality Date  . NO PAST SURGERIES      There were no vitals filed for this visit.  Subjective Assessment - 08/26/19 1022    Subjective  Had some pain in the R shoulder today - took some medication and  it is feeling better.    Pertinent History  PMH: graves disease, HLD, HTN    How long can you walk comfortably?  walks everyday - 30 minutes in the morning and 30 minutes in the afternoon    Pain Onset  More than a month ago    Pain Onset  Today                       [facility] Adult PT Treatment/Exercise - 08/26/19 0001      Transfers   Comments  floor <> stand transfers, needed min A today, gave pt increased time to problem solve - but needing cues to step backwards first with LLE in order to safely lower to the ground, pt attempted to stand up at first with RLE (with therapist cueing pt to perform with LLE), pt reported pain in R knee after trying and needed min A to safely come back to stand pushing with LLE      Ambulation/Gait   Ambulation/Gait  Yes    Ambulation/Gait Assistance  5: Supervision    Ambulation/Gait Assistance Details  pt with R knee pain after floor <> stand transfer today, pain subsided after taking a lap around the therapy gym    Ambulation Distance (Feet)  230 Feet    Assistive device  Straight cane   R  AFO   Gait Pattern  Step-through pattern;Decreased stance time - right;Decreased step length - left;Decreased hip/knee flexion - right;Decreased dorsiflexion - right;Decreased weight shift to right;Poor foot clearance - right;Right hip hike    Ambulation Surface  Level;Indoor;Paved;Outdoor    Gait velocity  18.16 seconds = 1.80 ft/sec     Curb  Other (comment)   min guard   Curb Details (indicate cue type and reason)  x2 reps outdoor curb with SPC, needed initial cues for proper technique. pt initially more fearful and afraid to fall today         Access Code: [email or web address]  URL: [email or web address]  Date: 08/26/2019  Prepared by: Janann August    Had pt demo all of HEP today with proper technique, initial cues needed for gastroc stretch, wall squat, and supine march.   Exercises Heel rises with counter support - 10 reps - 1 sets -  2x daily - 7x weekly Standing Gastroc Stretch - 3 reps - 1 sets - 30 seconds hold - 2x daily - 7x weekly Wall Squat - 10 reps - 1 sets - 2x daily - 7x weekly Bridge with Hip Abduction and Resistance - 10 reps - 1 sets - 2x daily - 7x weekly Seated Heel Slide - 10 reps - 2 sets - 1x daily - 7x weekly Single Leg Stance - 3 reps - 1 sets - 10 seconds hold - 1x daily - 7x weekly Supine March - 10 reps - 2 sets - 1x daily - 7x weekly Sit to Stand without Arm Support - 10 reps - 2 sets - 1x daily - 7x weekly     PT Education - 08/26/19 1210    Education Details  progress towards goals, reviewed HEP    Person(s) Educated  Patient    Methods  Explanation;Handout;Demonstration    Comprehension  Verbalized understanding;Returned demonstration       PT Short Term Goals - 07/19/19 1127      PT SHORT TERM GOAL #1   Title  The patient will be able to perform HEP mod indep for LE strength (ankle isolated control, hamstring, hip extensors). ALL STGS DUE 07/18/19    Baseline  pt reporting performing HEP    Time  7   7 weeks due to delay in scheduling.   Period  Weeks    Status  Achieved    Target Date  07/18/19      PT SHORT TERM GOAL #2   Title  Patient will improve BERG balance score to at leas a 44/56 in order to demo improved balance.    Baseline  46/56 on 07/19/19    Time  7    Period  Weeks    Status  Achieved      PT SHORT TERM GOAL #3   Title  Patient will ambulate at least 500' outdoors with mod I in order to demo improved endurance for functional gait.    Baseline  unable to assess today due to weather.    Time  7    Period  Weeks    Status  Deferred      PT SHORT TERM GOAL #4   Title  Patient will decrease TUG to 16 seconds or less with SPC in order to demo decr fall risk.    Baseline  16.75 seconds on 07/19/19    Status  Not Met        PT Long Term Goals - 08/26/19 1212      PT LONG  TERM GOAL #1   Title  The patient will negotiate a curb with SPC with supervision. ALL  LTGS DUE 08/27/19    Baseline  min guard ascending, supervision descending with SPC.    Time  3    Period  Weeks    Status  Partially Met      PT LONG TERM GOAL #2   Title  The patient will move floor<>stand with mod indep using L UE support on surface.    Baseline  needing min A today on 08/26/19 and cues for proper technique.    Time  3    Period  Weeks    Status  Not Met      PT LONG TERM GOAL #3   Title  Pt will improve gait speed with SPC to at least 1.85 ft/sec to demo decr fall risk.    Baseline  1.80 ft/sec with SPC on 08/26/19    Time  3    Period  Weeks    Status  Partially Met      PT LONG TERM GOAL #5   Title  Patient will be independent with final HEP in order to build upon functional gains made in therapy.    Time  3    Period  Weeks    Status  Achieved         PHYSICAL THERAPY DISCHARGE SUMMARY  Visits from Start of Care: 13  Current functional level related to goals / functional outcomes: See goals   Remaining deficits: R hemiparesis - weakness, gait abnormalities, impaired balance.   Education / Equipment: HEP  Plan: Patient agrees to discharge.  Patient goals were partially met. Patient is being discharged due to meeting the stated rehab goals.  ?????         Plan - 08/26/19 1221    Clinical Impression Statement  Focus of today's skilled session was assessing pt's LTGs for discharge. Pt has partially met 2 out of 4 LTGs - pt can perform a curb with min guard for ascending and supervision descending with SPC, continues to need initial cues for proper technique. Pt improved her gait speed to 1.80 ft/sec today with SPC (improved, but not to goal). Pt did not meet LTG in regards to floor transfer, needed min A today and still needs cues for proper technique. Pt met LTG in regards to performing HEP. Continued to educate pt on safety with ambulation and that using her SPC and R AFO is the safest in the community (pt verbalized understanding but still was  asking today with just using the [facility]). At this time pt will be discharged from PT due to partially meeting her goals and has appeared to reach a plateau and baseline of functioning.    Personal Factors and Comorbidities  Comorbidity 3+;Past/Current Experience;Time since onset of injury/illness/exacerbation    Comorbidities  graves disease, HLD, HTN    Examination-Activity Limitations  Stairs;Transfers;Locomotion Level    Examination-Participation Restrictions  Community Activity    Stability/Clinical Decision Making  Stable/Uncomplicated    Rehab Potential  Good    PT Frequency  2x / week   1-2x a week   PT Duration  3 weeks    PT Treatment/Interventions  ADLs/Self Care Home Management;Aquatic Therapy;Electrical Stimulation;Therapeutic exercise;Therapeutic activities;Functional mobility training;Stair training;Gait training;DME Instruction;Balance training;Neuromuscular re-education;Patient/family education;Orthotic Fit/Training;Energy conservation;Passive range of motion    PT Next Visit Plan  D/C    PT Home Exercise Plan  [email or web address]    Consulted and Agree with Plan of  Care  Patient       Patient will benefit from skilled therapeutic intervention in order to improve the following deficits and impairments:  Abnormal gait, Decreased balance, Decreased activity tolerance, Decreased coordination, Decreased endurance, Decreased range of motion, Difficulty walking, Decreased strength, Impaired tone, Pain  Visit Diagnosis: Muscle weakness (generalized)  Hemiparesis of right dominant side as late effect of cerebral infarction [facility])  Other symptoms and signs involving the nervous system  Other lack of coordination  Unsteadiness on feet     Problem List Patient Active Problem List   Diagnosis Date Noted  . Hematuria 05/31/2019  . Encounter for chronic pain management 01/22/2019  . Right spastic hemiplegia ([place]) 11/27/2018  . Elevated LFTs 11/20/2018  . 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 Unintentional weight loss 11/05/2018  . Fatigue 11/05/2018  . Bloody stool 11/05/2018  . Polyarthralgia 11/05/2018  . Chronic anticoagulation 11/05/2018  . Difficulty coping with disease 03/31/2018  . Hemiparesis of right dominant side as late effect of cerebral infarction ([place]) 09/17/2017  . Cardioembolic stroke ([place]) 98/26/4158  . 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 Cardiomyopathy ([place]) 09/17/2017  . H/O ischemic left MCA stroke 09/17/2017  . Cardiac LV ejection fraction 21-30%   . Graves disease   . Plaque psoriasis   . Neuropathy 05/19/2017    Arliss Journey, PT, DPT  08/26/2019, 12:21 PM  [facility] [address] [place], [place], [address] Phone: [phone]   Fax:  [phone]  Name: Tilda Samudio MRN: [id number] Date of Birth: [birth year]

## 2019-08-26 NOTE — Patient Instructions (Addendum)
Lateral Weight Shift: Upper Trunk Leading   Sit with feet flat on floor. Bring ___right_ shoulder, head and arm toward side until forearm/elbow just touches sitting surface.help with your other hand Hold __10__ seconds. Return to upright position by pushing through arm Repeat _10___ times per session. Do __2__ sessions per day.     [email or web address]      .  SHOULDER: Flexion On Table    Place hands on table, elbows straight. Move hips away from body. Press hands down into table. Hold _5__ seconds. 10___ reps per set, __2_ sets per day, _7__ days per week   Copyright  VHI. All rights reserved.  Shoulder Flexion      Perform only lying down-Clasp hands together and raise arms above head, keeping elbows as straight as possible.  Limit to just above shoulder height do not raise back all the way, this can cause shoulder pain and injury. Do 10 repetitions, 2 times per day.

## 2019-08-26 NOTE — Therapy (Signed)
[facility] [address] [place], [place], [address] Phone: [phone]   Fax:  [phone]  Occupational Therapy Treatment  Patient Details   Name: Margaret Ortiz MRN: [id number] Date of Birth: 10-23-1960 No data recorded  Encounter Date: 08/26/2019   OCCUPATIONAL THERAPY DISCHARGE SUMMARY    Current functional level related to goals / functional outcomes: Pt met all long term goals   Remaining deficits: RUE weakness/ hemiplegia, spasticity, aphasia, poor awareness, decreased functional mobility   Education / Equipment: Pt was educated regarding the following: proper positioning of RUE and activities to avoid to minimize pain(ie: avoid use of weights and extreme stretching), Updates to HEP, splint wear and care. Pt verbalized or returned demonstration of all education. Therapist discussed realistic expectations with pt and that the goal of her exercises should be to maintain ROM and to minimize pain. Pt continues to believe that she will regain active movement in RUE, therapist reinforced that this is highly unlikely. Therapist also told patient that she would not need to return to therapy unless she has a change in status.(Pt initally though she would retrun to therapy in 2 mons). Plan: Patient agrees to discharge.  Patient goals were met. Patient is being discharged due to meeting the stated rehab goals.  ?????         OT End of Session - 08/26/19 1112    Visit Number  9    Number of Visits  11    Date for OT Re-Evaluation  08/28/19    Authorization Type  BCBS    OT Start Time  1104    OT Stop Time  1135    OT Time Calculation (min)  31 min    Activity Tolerance  Patient tolerated treatment well    Behavior During Therapy  WFL for tasks assessed/performed       Past Medical History:  Diagnosis Date  . Aphasia 09/17/2017  . Aphasia as late effect of cerebrovascular accident (CVA) 05/19/2017   s/p stroke,  attempts to speak very difficult to understand.  . Cardiac LV ejection fraction 21-30%   . Cardioembolic stroke ([place]) 65/08/5463   Right hemiparesis  . Cardiomyopathy ([place]) 04/2017  . Dysphagia 05/19/2017   Last known diet upgraded to regular diet with thin liquids on 06/27/2017, no records available  . 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 Fall 09/17/2017  . Graves disease   . H/O ischemic left MCA stroke 09/17/2017  . Hemiparesis of right dominant side as late effect of cerebral infarction ([place]) 09/17/2017  . Hyperlipidemia   . Hypertension   . Neuropathy 05/19/2017   s/p stroke  . Plaque psoriasis   . Shoulder subluxation, right, initial encounter 09/17/2017  . Weakness 09/17/2017    Past Surgical History:  Procedure Laterality Date  . NO PAST SURGERIES      There were no vitals filed for this visit.  Subjective Assessment - 08/26/19 1320    Currently in Pain?  No/denies    Pain Onset  Today                Treatment: Therapist checked progress towards goals and reviewed proper positioning  with pt. Supine closed chain self P/ROM shoulder flexion and abduction, min v.c to avoid over stretch Standing bilateral AA/ROM/P/ROM shoulder flexion rollling ball on mat for gentle stretch, min facilitation/ v.c Weightbearing through mat with RUE, with body on arm movements, min-mod facilitation.  OT Education - 08/26/19 1318    Education Details  HEP see pt instructstion, pt was instructed to perform self ROM shoulder flexion in supine, 10-20 reps each , positioning for sleep   Person(s) Educated  Patient    Methods  Explanation;Demonstration;Verbal cues;Handout    Comprehension  Verbalized understanding;Returned demonstration;Verbal cues required              OT Long Term Goals - 08/26/19 1127      OT LONG TERM GOAL #1   Title  Pt will perform simple home maintenance tasks mod I incorporating RUE as stabilizer or using  hand-over-hand techniques when able.    Time  5    Period  Weeks    Status  Achieved      OT LONG TERM GOAL #2   Title  Pt will report R shoulder pain less than or equal to 3/10 for ADLs.    Time  5    Period  Weeks    Status  Achieved      OT LONG TERM GOAL #3   Title  I with updated HEP.    Time  5    Period  Weeks    Status  Achieved      OT LONG TERM GOAL #4   Title  Pt will verbalize/ demonstrate understanding of positioning to minimize pain and risk for RUE injury.    Time  5    Period  Weeks    Status  Achieved            Plan - 08/26/19 1114    Clinical Impression Statement  Pt demonstrates progress towards goals She agrees with plans for d/c.    Occupational performance deficits (Please refer to evaluation for details):  ADL's;IADL's;Leisure;Work;Social Participation    Body Structure / Function / Physical Skills  ADL;Balance;Body mechanics;Pain;ROM;UE functional use;Tone;IADL;GMC;FMC;Coordination;Sensation;Strength;Dexterity;Decreased knowledge of precautions    Rehab Potential  Good    OT Frequency  2x / week    OT Duration  --   5 weeks plus eval   OT Treatment/Interventions  Self-care/ADL training;Electrical Stimulation;Therapeutic exercise;Patient/family education;Splinting;Neuromuscular education;Paraffin;Moist Heat;Fluidtherapy;Energy conservation;Therapist, nutritional;Therapeutic activities;Cryotherapy;Ultrasound;DME and/or AE instruction;Manual Therapy;Passive range of motion;Cognitive remediation/compensation;Balance training;Aquatic Therapy    Plan  d/c OT, therapist discussed with pt that she will not need to return to therapy unless she has a change in status.    Consulted and Agree with Plan of Care  Patient       Patient will benefit from skilled therapeutic intervention in order to improve the following deficits and impairments:   Body Structure / Function / Physical Skills: ADL, Balance, Body mechanics, Pain, ROM, UE functional use, Tone,  IADL, GMC, FMC, Coordination, Sensation, Strength, Dexterity, Decreased knowledge of precautions       Visit Diagnosis: Muscle weakness (generalized)  Hemiparesis of right dominant side as late effect of cerebral infarction ([facility])  Other symptoms and signs involving the nervous system  Other lack of coordination  Chronic right shoulder pain  Other symptoms and signs involving cognitive functions following cerebral infarction    Problem List Patient Active Problem List   Diagnosis Date Noted  . Hematuria 05/31/2019  . Encounter for chronic pain management 01/22/2019  . Right spastic hemiplegia ([place]) 11/27/2018  . Elevated LFTs 11/20/2018  . 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 Unintentional weight loss 11/05/2018  . Fatigue 11/05/2018  . Bloody stool 11/05/2018  . Polyarthralgia 11/05/2018  . Chronic anticoagulation 11/05/2018  .  Difficulty coping with disease 03/31/2018  . Hemiparesis of right dominant side as late effect of cerebral infarction ([place]) 09/17/2017  . Cardioembolic stroke ([place]) 59/92/3414  . 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 Cardiomyopathy ([place]) 09/17/2017  . H/O ischemic left MCA stroke 09/17/2017  . Cardiac LV ejection fraction 21-30%   . Graves disease   . Plaque psoriasis   . Neuropathy 05/19/2017    Sloane Junkin 08/26/2019, 1:33 PM Theone Murdoch, OTR/L [phone] Phone: [phone] 1:33 PM 08/26/19 [facility] [address] [place], [place], [address] Phone: [phone]   Fax:  [phone]  Name: Shenelle Klas MRN: [id number] Date of Birth: 01/13/1961

## 2019-09-07 NOTE — Telephone Encounter (Signed)
See other MyChart message

## 2019-09-14 ENCOUNTER — Encounter: Payer: Self-pay | Admitting: Family Medicine

## 2019-09-16 ENCOUNTER — Ambulatory Visit: Payer: BLUE CROSS/BLUE SHIELD | Attending: Internal Medicine

## 2019-09-16 DIAGNOSIS — Z23 Encounter for immunization: Secondary | ICD-10-CM

## 2019-09-16 NOTE — Progress Notes (Signed)
   Covid-19 Vaccination Clinic  Name:  Margaret Ortiz    MRN: [id number] DOB: [birth year]  09/16/2019  Margaret Ortiz was observed post Covid-19 immunization for 15 minutes without incident. She was provided with Vaccine Information Sheet and instruction to access the V-Safe system.   Margaret Ortiz was instructed to call 911 with any severe reactions post vaccine: Marland Kitchen Difficulty breathing  . Swelling of face and throat  . A fast heartbeat  . A bad rash all over body  . Dizziness and weakness   Immunizations Administered    Name Date Dose VIS Date Route   Pfizer COVID-19 Vaccine 09/16/2019 10:22 AM 0.3 mL 06/04/2019 Intramuscular   Manufacturer: [place]   Lot: [id number]   [place]: [id number]

## 2019-09-27 ENCOUNTER — Telehealth: Payer: Self-pay | Admitting: Podiatry

## 2019-09-27 NOTE — Telephone Encounter (Signed)
Left message for pt to call to schedule an appt to pick up her shoes.

## 2019-10-11 ENCOUNTER — Ambulatory Visit: Payer: BLUE CROSS/BLUE SHIELD | Attending: Internal Medicine

## 2019-10-11 DIAGNOSIS — Z23 Encounter for immunization: Secondary | ICD-10-CM

## 2019-10-11 NOTE — Progress Notes (Signed)
   Covid-19 Vaccination Clinic  Name:  Margaret Ortiz    MRN: [id number] DOB: [birth year]  10/11/2019  [name] was observed post Covid-19 immunization for 15 minutes without incident. She was provided with Vaccine Information Sheet and instruction to access the V-Safe system.   [name] was instructed to call 911 with any severe reactions post vaccine: Marland Kitchen Difficulty breathing  . Swelling of face and throat  . A fast heartbeat  . A bad rash all over body  . Dizziness and weakness   Immunizations Administered    Name Date Dose VIS Date Route   Pfizer COVID-19 Vaccine 10/11/2019 11:30 AM 0.3 mL 08/18/2018 Intramuscular   Manufacturer: [place]   Lot: [id number]   [place]: [id number]

## 2019-10-14 ENCOUNTER — Ambulatory Visit: Payer: BLUE CROSS/BLUE SHIELD | Admitting: Orthotics

## 2019-10-14 ENCOUNTER — Other Ambulatory Visit: Payer: Self-pay

## 2019-10-14 DIAGNOSIS — M79609 Pain in unspecified limb: Secondary | ICD-10-CM

## 2019-10-14 DIAGNOSIS — I69351 Hemiplegia and hemiparesis following cerebral infarction affecting right dominant side: Secondary | ICD-10-CM

## 2019-10-14 DIAGNOSIS — M21371 Foot drop, right foot: Secondary | ICD-10-CM

## 2019-10-14 DIAGNOSIS — B351 Tinea unguium: Secondary | ICD-10-CM

## 2019-10-14 NOTE — Progress Notes (Signed)
Sold her a pair of apex shoes, will ship to her house.

## 2019-10-20 ENCOUNTER — Telehealth: Payer: Self-pay

## 2019-10-20 NOTE — Telephone Encounter (Signed)
Katie from rehab called in needing the last office visit notes for the patient. As there is a  Games developer with the patients and facility understanding the patient and they can't get much info out of her and why she is visiting    Please advise    [facility]  Fax number [phone]  Attn: Joellen Jersey

## 2019-10-20 NOTE — Telephone Encounter (Signed)
PT was called and stated pt had just called and asked to be scheduled. PT office made aware we did not tell her to do this or refer her. They were given number to her Neurologist to see if they have sent referral for patient. No records can be sent from out office unless patient comes and signs record release form.

## 2019-10-21 ENCOUNTER — Telehealth: Payer: Self-pay | Admitting: Neurology

## 2019-10-21 NOTE — Telephone Encounter (Signed)
My chart message sent to Dr. Tomi Likens waiting on response

## 2019-10-21 NOTE — Telephone Encounter (Signed)
Katie from [facility] called requested a call back from a nurse. She said she needs to clarify where the patient is getting physical therapy at.

## 2019-10-21 NOTE — Telephone Encounter (Signed)
Patient called and requested a referral for physical therapy for her right side. She said she'd like to do the therapy at: [facility] at [address], [address].  Fax for facility: [phone]

## 2019-10-21 NOTE — Telephone Encounter (Signed)
I left message that I do not see any note of physical therapy

## 2019-10-22 ENCOUNTER — Other Ambulatory Visit: Payer: Self-pay

## 2019-10-22 DIAGNOSIS — I69351 Hemiplegia and hemiparesis following cerebral infarction affecting right dominant side: Secondary | ICD-10-CM

## 2019-10-22 DIAGNOSIS — R200 Anesthesia of skin: Secondary | ICD-10-CM

## 2019-10-22 DIAGNOSIS — I69320 Aphasia following cerebral infarction: Secondary | ICD-10-CM

## 2019-10-22 DIAGNOSIS — I429 Cardiomyopathy, unspecified: Secondary | ICD-10-CM

## 2019-10-22 DIAGNOSIS — Z8673 Personal history of transient ischemic attack (TIA), and cerebral infarction without residual deficits: Secondary | ICD-10-CM

## 2019-10-22 NOTE — Progress Notes (Signed)
Per pt and Dr. Tomi Likens approval Referral to [facility] Physical therapy added to pt chart.

## 2019-10-28 ENCOUNTER — Other Ambulatory Visit: Payer: Self-pay | Admitting: Neurology

## 2019-10-29 IMAGING — MR MR HEAD W/O CM
10 series · 48 of 48 positions shown · non-contrast
Comparison: None.

CLINICAL DATA: Right facial numbness and occipital headache.
Weakness with difficulty walking and talking. History of stroke.

EXAM:
MRI HEAD WITHOUT CONTRAST
TECHNIQUE: Multiplanar, multiecho pulse sequences of the brain and surrounding
structures were obtained without intravenous contrast.

[Series 2: T1 · sagittal · 5.0mm · 0.45mm/px · 2 of 21 slices shown]
[im 1/21]
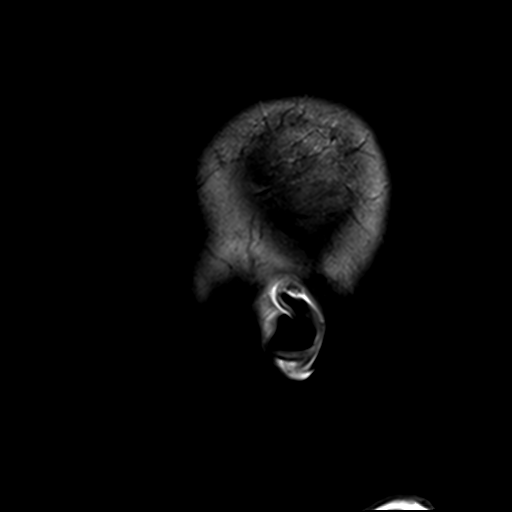
[im 21/21]
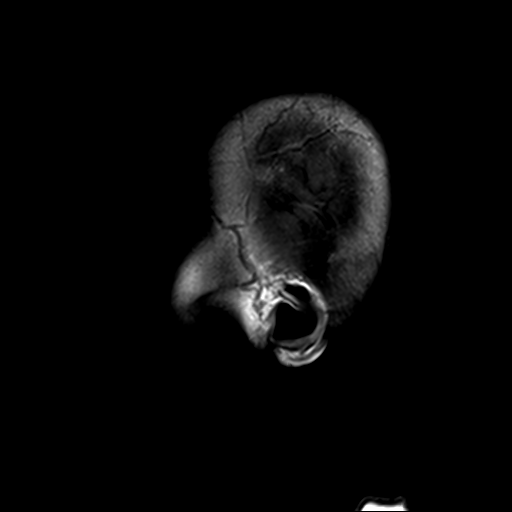

[Series 3: DWI · axial · 3.0mm · 1.80mm/px · z∈[+12,+158]mm · 9 of 100 slices shown (1 of 4)]
[im 1/100]
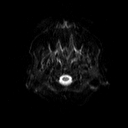
[im 13/100]
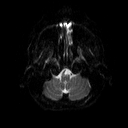
[im 25/100]
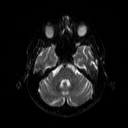
[im 38/100]
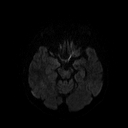
[im 50/100]
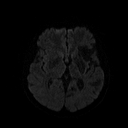
[im 62/100]
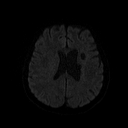
[im 75/100]
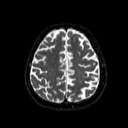
[im 87/100]
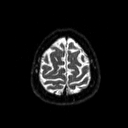
[im 100/100]
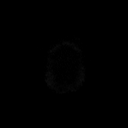

[Series 4: DWI · axial · 3.0mm · 1.80mm/px · z∈[+12,+158]mm · 4 of 47 slices shown (2 of 4)]
[im 1/47]
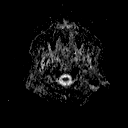
[im 16/47]
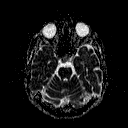
[im 31/47]
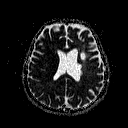
[im 47/47]
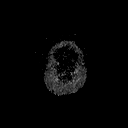

[Series 5: DWI · coronal · 5.0mm · 1.80mm/px · 6 of 67 slices shown (3 of 4)]
[im 1/67]
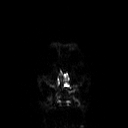
[im 14/67]
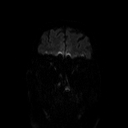
[im 27/67]
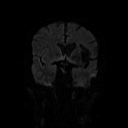
[im 40/67]
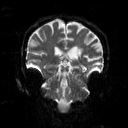
[im 53/67]
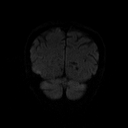
[im 67/67]
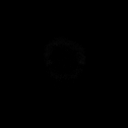

[Series 6: DWI · coronal · 5.0mm · 1.80mm/px · 3 of 34 slices shown (4 of 4)]
[im 1/34]
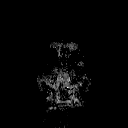
[im 17/34]
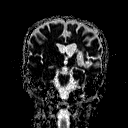
[im 34/34]
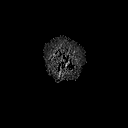

[Series 7: T2 · axial · 5.0mm · 0.51mm/px · z∈[+11,+157]mm · 2 of 22 slices shown (1 of 2)]
[im 1/22]
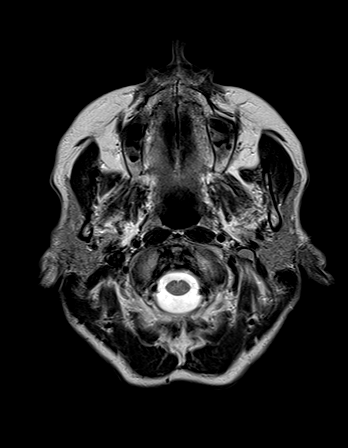
[im 22/22]
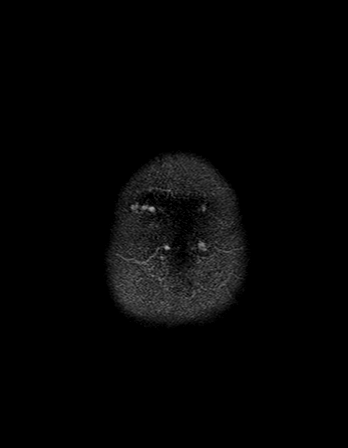

[Series 8: FLAIR · axial · 3.0mm · 0.45mm/px · z∈[+9,+161]mm · 3 of 34 slices shown]
[im 1/34]
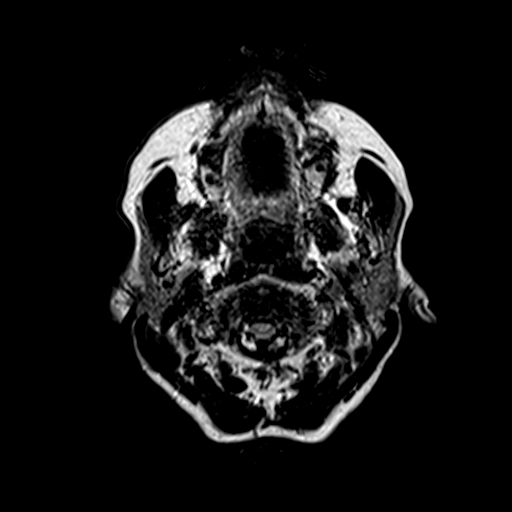
[im 17/34]
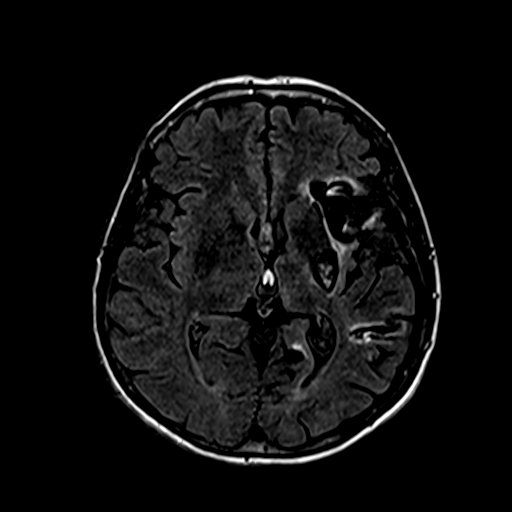
[im 34/34]
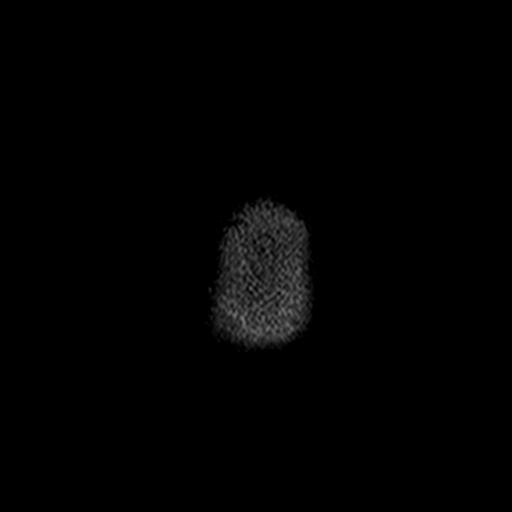

[Series 10: swi_images · axial · 4.0mm · 0.90mm/px · z∈[+7,+162]mm · 4 of 40 slices shown]
[im 1/40]
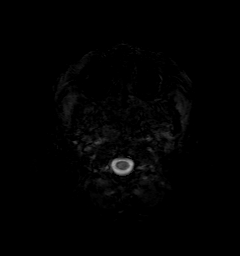
[im 14/40]
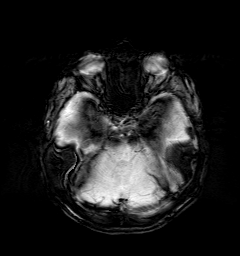
[im 27/40]
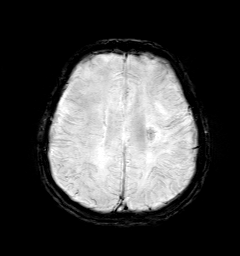
[im 40/40]
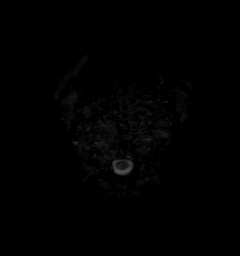

[Series 11: t1_mpr_tra · axial · 1.1mm · 0.71mm/px · z∈[+6,+162]mm · 13 of 144 slices shown]
[im 1/144]
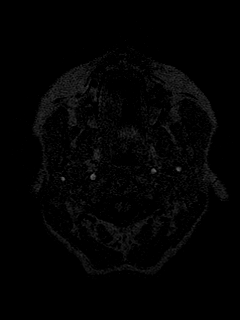
[im 12/144]
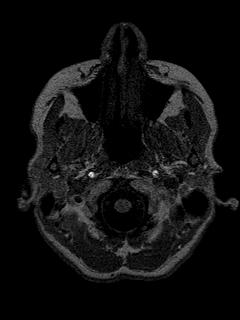
[im 24/144]
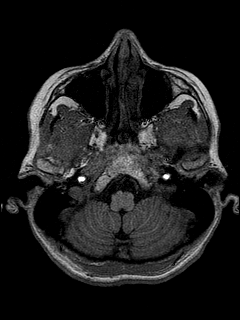
[im 36/144]
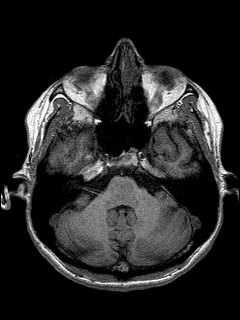
[im 48/144]
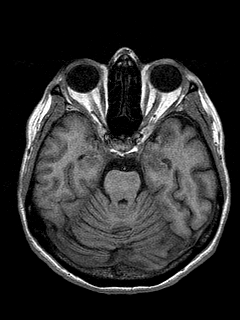
[im 60/144]
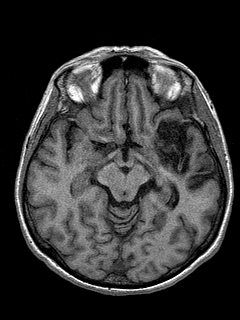
[im 72/144]
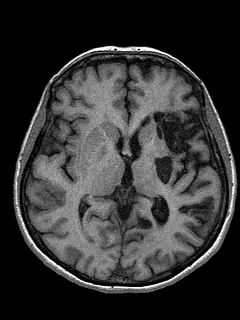
[im 84/144]
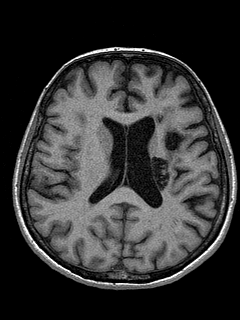
[im 96/144]
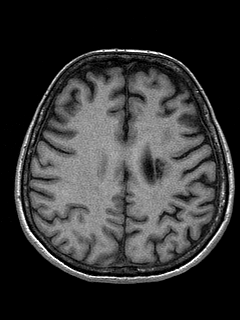
[im 108/144]
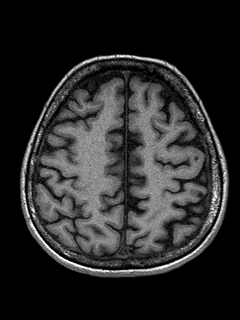
[im 120/144]
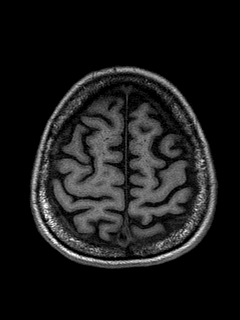
[im 132/144]
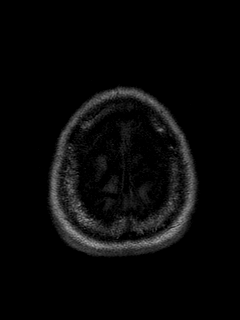
[im 144/144]
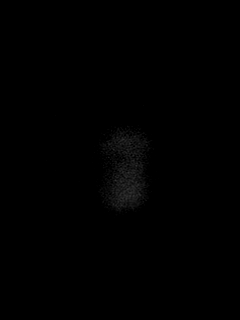

[Series 12: T2 · coronal · 5.0mm · 0.45mm/px · 2 of 27 slices shown (2 of 2)]
[im 1/27]
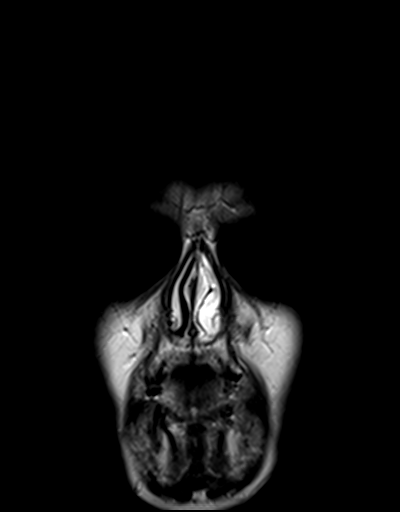
[im 27/27]
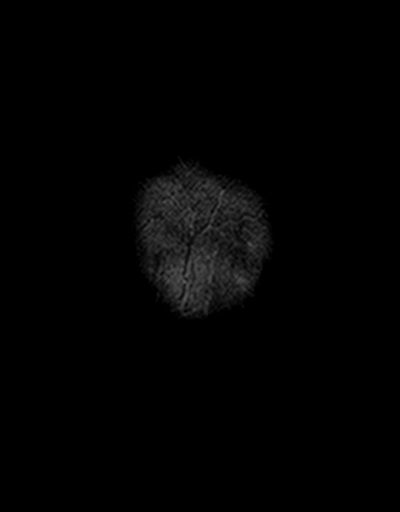

[48 of 48 positions shown; findings below may reference images not displayed]

FINDINGS: Brain: No acute infarct, mass, midline shift, or extra-axial fluid
collection is identified. There is a moderate-sized chronic left MCA
infarct involving the insula, frontal, temporal, and parietal lobes,
basal ganglia, and adjacent deep white matter with associated
chronic blood products. There is ex vacuo dilatation of the left
lateral ventricle. A small chronic medial right parieto-occipital
infarct is also noted.

Vascular: Major intracranial vascular flow voids are preserved.

Skull and upper cervical spine: Unremarkable bone marrow signal.

Sinuses/Orbits: Unremarkable orbits. Paranasal sinuses and mastoid
air cells are clear.

Other: None.
IMPRESSION: 1. No acute intracranial abnormality.
2. Chronic left cerebral hemispheric infarcts as above.

## 2019-11-01 ENCOUNTER — Encounter: Payer: Self-pay | Admitting: Family Medicine

## 2019-11-04 ENCOUNTER — Other Ambulatory Visit: Payer: Self-pay | Admitting: Family Medicine

## 2019-11-04 DIAGNOSIS — I69891 Dysphagia following other cerebrovascular disease: Secondary | ICD-10-CM

## 2019-11-30 ENCOUNTER — Other Ambulatory Visit: Payer: Self-pay | Admitting: Interventional Cardiology

## 2019-11-30 DIAGNOSIS — I639 Cerebral infarction, unspecified: Secondary | ICD-10-CM

## 2019-11-30 DIAGNOSIS — I69351 Hemiplegia and hemiparesis following cerebral infarction affecting right dominant side: Secondary | ICD-10-CM

## 2019-11-30 NOTE — Telephone Encounter (Signed)
Pt last saw Dr Irish Lack 07/27/19, last labs 07/02/19 Creat 0.89, age 59, weight 54.4kg, based on specified criteria pt is on appropriate dosage of Eliquis 5mg  BID.  Will refill rx.

## 2019-12-01 ENCOUNTER — Encounter: Payer: Self-pay | Admitting: Family Medicine

## 2019-12-01 ENCOUNTER — Other Ambulatory Visit: Payer: Self-pay

## 2019-12-01 DIAGNOSIS — Z8673 Personal history of transient ischemic attack (TIA), and cerebral infarction without residual deficits: Secondary | ICD-10-CM

## 2019-12-03 ENCOUNTER — Ambulatory Visit (INDEPENDENT_AMBULATORY_CARE_PROVIDER_SITE_OTHER): Payer: BLUE CROSS/BLUE SHIELD | Admitting: Family Medicine

## 2019-12-03 ENCOUNTER — Encounter: Payer: Self-pay | Admitting: Family Medicine

## 2019-12-03 ENCOUNTER — Other Ambulatory Visit: Payer: Self-pay

## 2019-12-03 VITALS — BP 109/72 | HR 81 | Temp 98.3°F | Resp 18 | Ht 61.0 in | Wt 121.5 lb

## 2019-12-03 DIAGNOSIS — R4589 Other symptoms and signs involving emotional state: Secondary | ICD-10-CM | POA: Diagnosis not present

## 2019-12-03 DIAGNOSIS — I69351 Hemiplegia and hemiparesis following cerebral infarction affecting right dominant side: Secondary | ICD-10-CM | POA: Diagnosis not present

## 2019-12-03 DIAGNOSIS — G8111 Spastic hemiplegia affecting right dominant side: Secondary | ICD-10-CM

## 2019-12-03 DIAGNOSIS — G629 Polyneuropathy, unspecified: Secondary | ICD-10-CM

## 2019-12-03 DIAGNOSIS — M792 Neuralgia and neuritis, unspecified: Secondary | ICD-10-CM

## 2019-12-03 DIAGNOSIS — I69398 Other sequelae of cerebral infarction: Secondary | ICD-10-CM

## 2019-12-03 DIAGNOSIS — G8929 Other chronic pain: Secondary | ICD-10-CM

## 2019-12-03 MED ORDER — OXYCODONE HCL 5 MG PO TABS: 2.5000 mg | ORAL_TABLET | Freq: Two times a day (BID) | ORAL | 0 refills | Status: DC

## 2019-12-03 NOTE — Patient Instructions (Addendum)
We are changing your pain mediation.  Oxycodone 5 mg tab (you can take 1/2 tab - 1 tab every 12 hours)  Follow up in 3 months on pain.

## 2019-12-03 NOTE — Progress Notes (Signed)
Patient Care Team    Relationship Specialty Notifications Start End  Ma Hillock, DO PCP - General Family Medicine  09/16/17   [name], MD Consulting Physician Cardiology  05/18/18   [name], DO Consulting Physician Neurology  05/18/18     SUBJECTIVE Chief Complaint  Patient presents with   Shoulder Pain    Pt states she is here for pain in her R shoulder. Pt is taking 1/2 tablet of Tramadol BID.     HPI: Margaret Ortiz is a 59 y.o.  female presents today to discuss her pain management.  Patient unfortunately has suffered from cerebrovascular accident with hemiparesis of right dominant side.  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  She is continually working with physical therapy and occupational therapy to increase her strength and endurance.  She was referred last appointment again to physical therapy.  NSAIDs are contraindicated secondary to the use with her blood thinners.  She had been taking Tylenol at higher doses and as a consequence liver enzymes had elevated.   Tramadol 25 mg twice daily as needed had been prescribed for her.  Tramadol 25 mg was not enough to cover her pain, but tramadol 50 mg caused her to be dizzy and have side effects.   She has presented here a few times and asked for daily pain medicine and it has been explained to her she can not  take higher doses of NSAIDs or acetaminophen secondary to her chronic conditions.  Patient is prescribed gabapentin 600 mg twice daily.  Higher doses of gabapentin caused her sedation. She was described very low-dose hydrocodone 5-3 25 (half tab) twice daily and she reports it was helpful but she stopped taking it because it did have some Tylenol in it.  Although this has been explained to her that would be a very low daily dose and safe.   Today he presents to discuss for pain management.  And asks if pain is going to be permanent  ROS: See pertinent positives and negatives per HPI.  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 11/20/2018   Unintentional weight loss 11/05/2018   Fatigue 11/05/2018   Bloody stool 11/05/2018   Polyarthralgia 11/05/2018   Chronic anticoagulation 11/05/2018   Difficulty coping with disease 03/31/2018   Hemiparesis of right dominant side as late effect of cerebral infarction ([place]) 48/18/5631   Cardioembolic stroke ([place]) 49/70/2637   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) 09/17/2017   Cardiomyopathy ([place]) 09/17/2017   H/O ischemic left MCA stroke 09/17/2017   Cardiac LV ejection fraction 21-30%    Graves disease    Plaque psoriasis    Neuropathy 05/19/2017    Social History   Tobacco Use   Smoking status: Former Smoker   Smokeless tobacco: Never Used  Substance Use Topics   Alcohol use: Never    Current Outpatient Medications:    atorvastatin (LIPITOR) 80 MG tablet, Take 1 tablet (80 mg total) by mouth daily., Disp: 90 tablet, Rfl: 2   carvedilol (COREG) 3.125 MG tablet, Take 1 tablet (3.125 mg total) by mouth 2 (two) times daily., Disp: 180 tablet, Rfl: 2   clobetasol cream (TEMOVATE) 8.58 %, Apply 1 application topically 2 (two) times daily., Disp: 30 g, Rfl:  5   ELIQUIS 5 MG TABS tablet, TAKE 1 TABLET TWICE A DAY, Disp: 180 tablet, Rfl: 1   esomeprazole (NEXIUM) 40 MG capsule, TAKE 1 CAPSULE DAILY AT 12 NOON, Disp: 90 capsule, Rfl: 0   gabapentin (NEURONTIN) 300 MG capsule, TAKE 2 CAPSULES IN THE MORNING AND 2 CAPSULES IN THE EVENING, Disp: 340 capsule, Rfl: 3   oxyCODONE (ROXICODONE) 5 MG immediate release tablet, Take 0.5-1 tablets (2.5-5 mg total) by mouth in the morning and at bedtime., Disp: 180 tablet, Rfl: 0  Allergies  Allergen Reactions   Dust Mite  Extract    Penicillins Rash    OBJECTIVE: BP 109/72 (BP Location: Left Arm, Patient Position: Sitting, Cuff Size: Normal)    Pulse 81    Temp 98.3 F (36.8 C) (Temporal)    Resp 18    Ht 5\' 1"  (1.549 m)    Wt 121 lb 8 oz (55.1 kg)    SpO2 94%    BMI 22.96 kg/m  Gen: Afebrile. No acute distress.  HENT: AT. [place].  Eyes:Pupils Equal Round Reactive to light, Extraocular movements intact,  Conjunctiva without redness, discharge or icterus. Neuro: Walks well with assistance of cane. PERLA. EOMi. Alert. Oriented x3 Psych: Normal affect, dress and demeanor. Normal speech. Normal thought content and judgment.    ASSESSMENT AND PLAN: Margaret Ortiz is a 59 y.o. 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   Patient again was explained that secondary to her chronic conditions chronic NSAIDs (daily) and aspirin are contraindicated.   - Therefore the next level of pain management would have been tramadol-which she is not effective at low-dose and caused undesirable side effects at increased dose.  >>DC'd last visit - She is on gabapentin which cannot be increased any higher secondary to side effects. -hydrocodone-acetaminophen 5-3 25 (half tab twice daily as needed) was tried.  Patient discontinued medication because it had Tylenol-despite counseling this would be a low daily dose.   -She has been prescribed baclofen low-dose -she declines to take the medication. -Last medication to offer his oxycodone 2.5 mg twice daily as needed this was called into her mail-in pharmacy per her request. -[place] controlled substance database reviewed and appropriate  12/03/19 -NSAIDs contraindicated secondary to chronic anticoagulation -Higher doses of Tylenol contraindicated secondary to elevated liver enzymes with the use. Discussed with her today if pain is not controlled with regimen>> then would refer her back to orthopedic to see if they have anything further to offer her.  Could also consider trying Cymbalta at low dose if pain still not controlled on follow-up We again discussed today the neurological deficits and pain that has been present since her stroke are likely to be permanent.  She has worked with physical therapy and has done excellent.  However, she does not seem to completely understand returning to her normal baseline prestroke, is not likely to occur. Follow-up in 3 months, sooner if pain not controlled   No orders of the defined types were placed in this encounter.  Meds ordered this encounter  Medications   oxyCODONE (ROXICODONE) 5 MG immediate release tablet    Sig: Take 0.5-1 tablets (2.5-5 mg total) by mouth in the morning and at bedtime.    Dispense:  180 tablet  Refill:  [place], DO 12/03/2019

## 2019-12-15 ENCOUNTER — Telehealth: Payer: Self-pay

## 2019-12-15 NOTE — Telephone Encounter (Signed)
If the information/content has not changed for her> then may change date.

## 2019-12-15 NOTE — Telephone Encounter (Signed)
Pt would like the date of these forms that we did in December 2020 changed to 12/14/2019 or 12/15/2019. Please advise.

## 2019-12-15 NOTE — Telephone Encounter (Signed)
Requested to speak to nurse about her evaluation being mailed to the government ?    Please call patient [phone]

## 2019-12-16 NOTE — Telephone Encounter (Signed)
The dates were changed to 12/15/2019. Papers faxed to number on paperwork with confirmation received. Copy mailed to patient.

## 2019-12-28 NOTE — Progress Notes (Signed)
NEUROLOGY FOLLOW UP OFFICE NOTE  Margaret Ortiz [id number]  HISTORY OF PRESENT ILLNESS: Margaret Ortiz is a 59 year old right-handed female with Grave's disease, plaque psoriasis, cardiomyopathy and residual right sided weakness, aphasia and dysarthriasecondary to left MCA stroke whofollows up for stroke.  UPDATE: Current medications: Eliquis, atorvastatin80mg , Coreg 3.125mg  twice daily, gabapentin 600mg  twice daily.    Has undergone another round of PT.  Still with pain in her right leg.  She would like to be able to drive if possible.  We referred her to 2 occupational therapy driving evaluators but she has not been contacted yet.  HISTORY: She was admitted to [facility] 05/19/17 for left MCA stroke. She was found to have a thrombus on imaging but was determined not to be a candidate for intervention due to completed stroke. She was diagnosed with cardiomyopathy as echocardiogram demonstrated a LV EF of 15-20%. Other testing is not available to me at this time. Repeat echocardiogram on 11/30 showed improved EF of 20-25%. It was believed that her stroke was likely cardioembolic secondary to undiagnosed cardiomyopathy. She was started on Coumadin which was later changed to Eliquis.  She subsequently developed significant dysphagia. MBS on 06/27/17 indicated that her diet could be upgraded to regular diet with thin liquids. She was admitted to inpatient rehab until March 2019.Echocardiogram from 02/03/18 showed EF 25-30% with severe global LV systolic dysfunction, moderate diastolic dysfunction, mild MR and mild LAE.  On 02/18/19, she developed right facial numbness, as well as bilateral occipital headache. Headache comes and goes and responds to tramadol. She says she thinks her speech is worse. MRI of brain without contrast on 03/29/2019 was personally reviewed and demonstrated chronic left MCA territory infarct involving the insula, frontal, temporal and parietal  lobes and subcortical white matter but no acute changes.      She has since moved to [place] to [employer] her sister   Past medications:  Coumadin, baclofen ineffective, tramadol (increased headaches).  PAST MEDICAL HISTORY: Past Medical History:  Diagnosis Date  . Aphasia 09/17/2017  . 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  . Cardiac LV ejection fraction 21-30%   . Cardioembolic stroke ([place]) 56/21/3086   Right hemiparesis  . Cardiomyopathy ([place]) 04/2017  . Dysphagia 05/19/2017   Last known diet upgraded to regular diet with thin liquids on 06/27/2017, no records available  . 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 Fall 09/17/2017  . Graves disease   . H/O ischemic left MCA stroke 09/17/2017  . Hemiparesis of right dominant side as late effect of cerebral infarction ([place]) 09/17/2017  . Hyperlipidemia   . Hypertension   . Neuropathy 05/19/2017   s/p stroke  . Plaque psoriasis   . Shoulder subluxation, right, initial encounter 09/17/2017  . Weakness 09/17/2017    MEDICATIONS: Current Outpatient Medications on File Prior to Visit  Medication Sig Dispense Refill  . atorvastatin (LIPITOR) 80 MG tablet Take 1 tablet (80 mg total) by mouth daily. 90 tablet 2  . carvedilol (COREG) 3.125 MG tablet Take 1 tablet (3.125 mg total) by mouth 2 (two) times daily. 180 tablet 2  . clobetasol cream (TEMOVATE) 5.78 % Apply 1 application topically 2 (two) times daily. 30 g 5  . ELIQUIS 5 MG TABS tablet TAKE 1 TABLET TWICE A DAY 180 tablet 1  . esomeprazole (NEXIUM) 40 MG capsule TAKE 1 CAPSULE DAILY AT 12 NOON  90 capsule 0  . gabapentin (NEURONTIN) 300 MG capsule TAKE 2 CAPSULES IN THE MORNING AND 2 CAPSULES IN THE EVENING 340 capsule 3  . oxyCODONE (ROXICODONE) 5 MG immediate release tablet Take 0.5-1 tablets (2.5-5 mg total) by mouth in the morning and at bedtime. 180 tablet 0   No  current facility-administered medications on file prior to visit.    ALLERGIES: Allergies  Allergen Reactions  . Dust Mite Extract   . Penicillins Rash    FAMILY HISTORY: Family History  Problem Relation Age of Onset  . Hyperlipidemia Mother   . Hypertension Mother   . Early death Father   . Hypertension Sister   . Heart attack Brother   . Mental illness Brother    SOCIAL HISTORY: Social History   Socioeconomic History  . Marital status: Divorced    Spouse name: Not on file  . Number of children: 1  . Years of education: 15  . Highest education level: Professional school degree (e.g., MD, DDS, DVM, JD)  Occupational History  . Occupation: Community education officer - [employer]  Tobacco Use  . Smoking status: Former Research scientist (life sciences)  . Smokeless tobacco: Never Used  Vaping Use  . Vaping Use: Never used  Substance and Sexual Activity  . Alcohol use: Never  . Drug use: Never  . Sexual activity: Not Currently  Other Topics Concern  . Not on file  Social History Narrative   Divorced.  From [place], moved to [place] to stay with her family after having a stroke March 2019   Graduate degree, works as a Community education officer.   Former smoker.   Exercise routinely prior to stroke.   Wears a hearing aid.   Smoke alarms in the home.   Wears her seatbelt.      Patient is right-handed. She drinks two cup of coffee a day. She now resides with her sister and brother-in-law.   Social Determinants of Health   Financial Resource Strain:   . Difficulty of Paying Living Expenses:   Food Insecurity:   . Worried About Charity fundraiser in the Last Year:   . Arboriculturist in the Last Year:   Transportation Needs:   . Film/video editor (Medical):   Marland Kitchen Lack of Transportation (Non-Medical):   Physical Activity:   . Days of Exercise per Week:   . Minutes of Exercise per Session:   Stress:   . Feeling of Stress :   Social Connections:   . Frequency of Communication with Friends and Family:   .  Frequency of Social Gatherings with Friends and Family:   . Attends Religious Services:   . Active Member of Clubs or Organizations:   . Attends Archivist Meetings:   Marland Kitchen Marital Status:   Intimate Partner Violence:   . Fear of Current or Ex-Partner:   . Emotionally Abused:   Marland Kitchen Physically Abused:   . Sexually Abused:     PHYSICAL EXAM: Blood pressure 110/70, pulse (!) 57, height 5\' 1"  (1.549 m), weight 120 lb (54.4 kg), SpO2 98 %. General: No acute distress.  Patient appears well-groomed.   Head:  Normocephalic/atraumatic Eyes:  Fundi examined but not visualized Neck: supple, no paraspinal tenderness, full range of motion Heart:  Regular rate and rhythm Lungs:  Clear to auscultation bilaterally Back: No paraspinal tenderness Neurological Exam: alert and oriented to person, place, and time. Attention span and concentration intact, recent and remote memory intact, fund of knowledge intact.  Speech fluent and not  dysarthric, language intact.  CN II-XII intact. Bulk normal.  Increased right sided tone.  Right upper extremity monoplegia except 1+/5 right finger flexion, 3+/5 right hip flexion, otherwise plegic with right foot drop in right leg.  Otherwise, 5/5.  Sensation to pinprick and vibration intact.  Deep tendon reflexes 3+ right upper and lower extremities, 2+ left upper and lower extremities.  Left finger to nose without dysmetria, unable to assess right.  Right hemiplegic gait with cane.    IMPRESSION: 1.  Right hemiparesis and expressive aphasia secondary to left MCA stroke, likely cardioembolic 2.  Cardiomyopathy  PLAN: 1.  Will see where to send her for formal driving evaluation and recommendations for any assisted driving devices. 2.  Secondary stroke prevention as per PCP/cardiology. 3.  From a neurologic standpoint, she may follow up as needed.  Gabapentin can be refilled by her PCP.  If pain continues to be a problem, recommend referral to PM&R or pain  management.  Metta Clines, DO  CC: Howard Pouch, DO

## 2019-12-29 ENCOUNTER — Other Ambulatory Visit: Payer: Self-pay

## 2019-12-29 ENCOUNTER — Ambulatory Visit (INDEPENDENT_AMBULATORY_CARE_PROVIDER_SITE_OTHER): Payer: BLUE CROSS/BLUE SHIELD | Admitting: Neurology

## 2019-12-29 ENCOUNTER — Encounter: Payer: Self-pay | Admitting: Neurology

## 2019-12-29 VITALS — BP 110/70 | HR 57 | Ht 61.0 in | Wt 120.0 lb

## 2019-12-29 DIAGNOSIS — I69351 Hemiplegia and hemiparesis following cerebral infarction affecting right dominant side: Secondary | ICD-10-CM

## 2019-12-29 DIAGNOSIS — I639 Cerebral infarction, unspecified: Secondary | ICD-10-CM

## 2019-12-29 DIAGNOSIS — I429 Cardiomyopathy, unspecified: Secondary | ICD-10-CM

## 2019-12-29 NOTE — Patient Instructions (Signed)
1.  We will find out where to send you for evaluation of driving with recommendations 2.  Otherwise, you may follow up with me as needed

## 2020-01-15 ENCOUNTER — Other Ambulatory Visit: Payer: Self-pay | Admitting: Family Medicine

## 2020-01-15 ENCOUNTER — Other Ambulatory Visit: Payer: Self-pay | Admitting: Interventional Cardiology

## 2020-01-15 DIAGNOSIS — I63412 Cerebral infarction due to embolism of left middle cerebral artery: Secondary | ICD-10-CM

## 2020-01-15 DIAGNOSIS — I69891 Dysphagia following other cerebrovascular disease: Secondary | ICD-10-CM

## 2020-01-17 ENCOUNTER — Other Ambulatory Visit: Payer: Self-pay

## 2020-01-17 DIAGNOSIS — I69891 Dysphagia following other cerebrovascular disease: Secondary | ICD-10-CM

## 2020-01-17 MED ORDER — ESOMEPRAZOLE MAGNESIUM 40 MG PO CPDR: DELAYED_RELEASE_CAPSULE | ORAL | 0 refills | Status: DC

## 2020-02-15 IMAGING — US ULTRASOUND ABDOMEN COMPLETE
1 series · 14 of 25 positions shown · non-contrast
Comparison: None.

CLINICAL DATA: Elevated liver function tests.

EXAM:
ABDOMEN ULTRASOUND COMPLETE

[Series 1: ultrasound abdomen complete · 0.19mm/px · 14 of 264 slices shown]
[im 1/264]
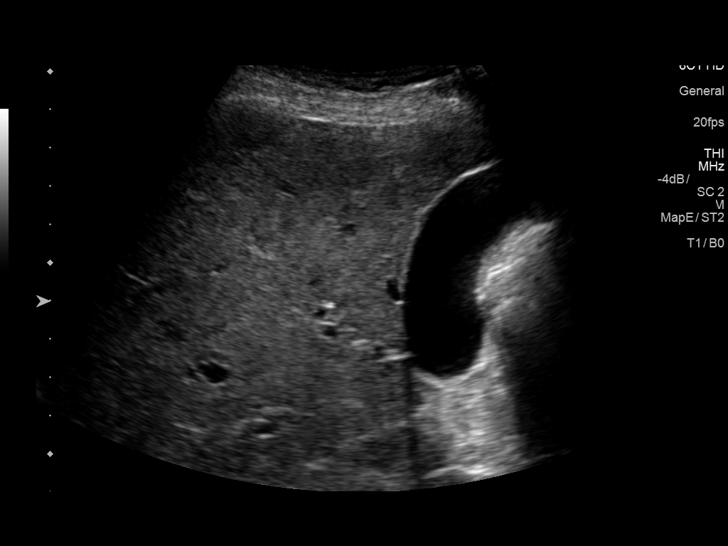
[im 22/264]
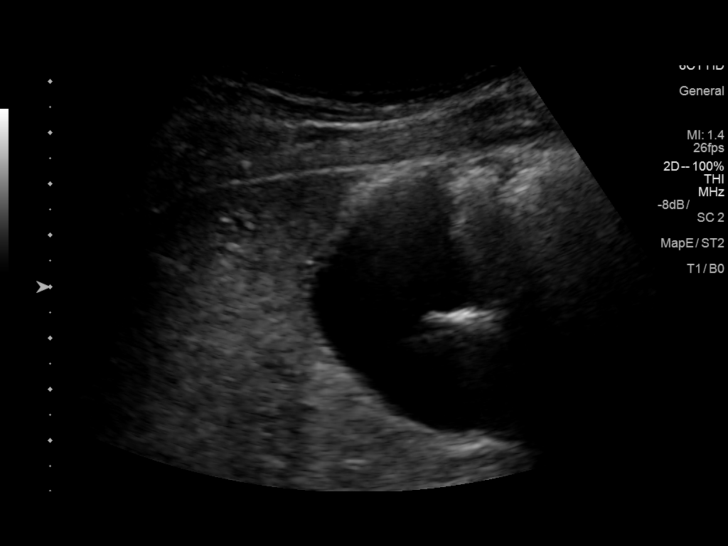
[im 44/264]
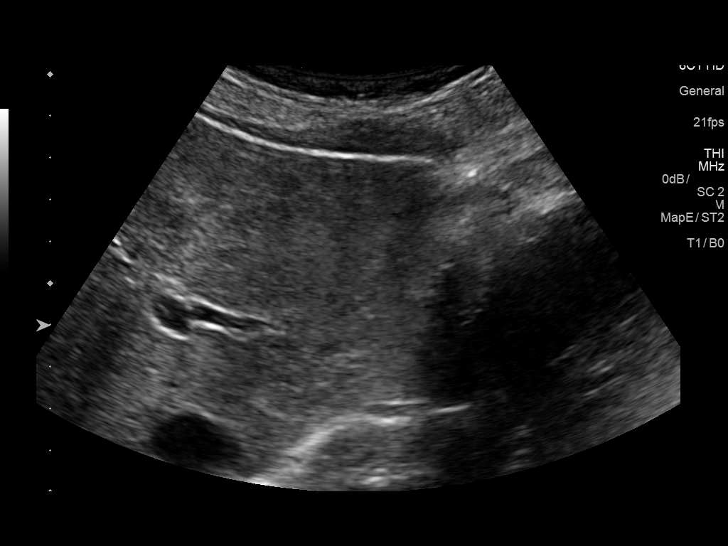
[im 66/264]
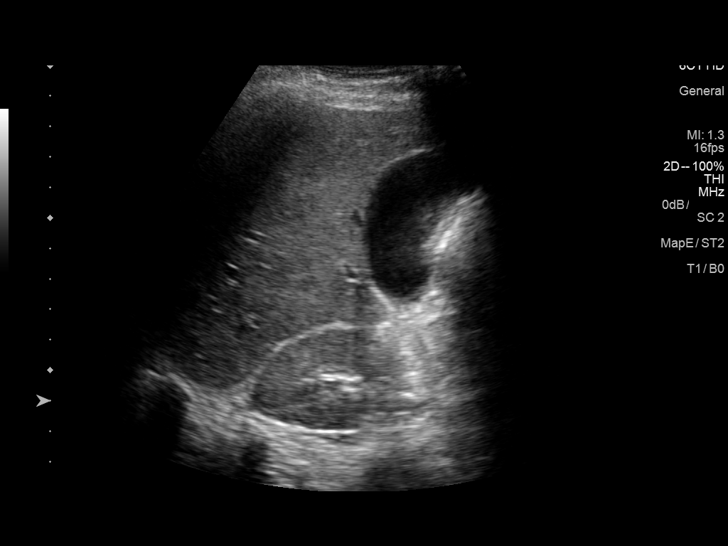
[im 88/264]
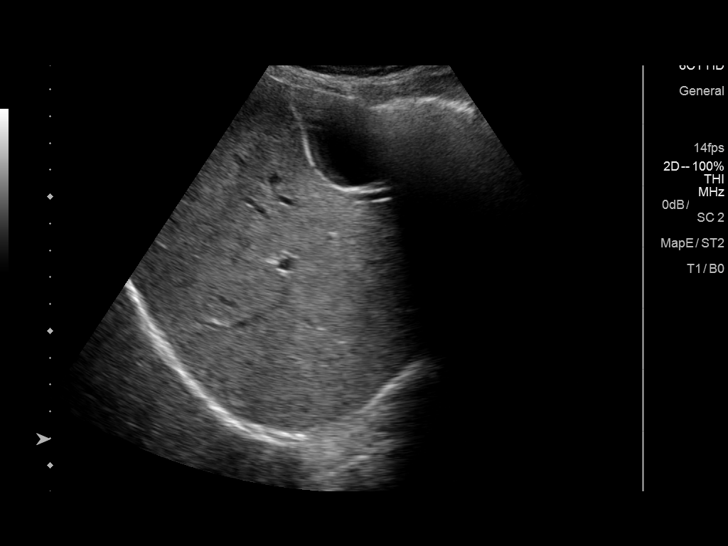
[im 99/264]
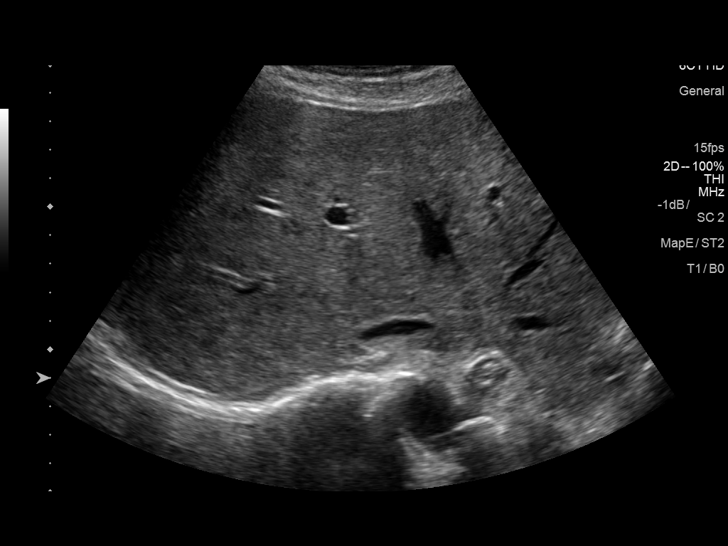
[im 121/264]
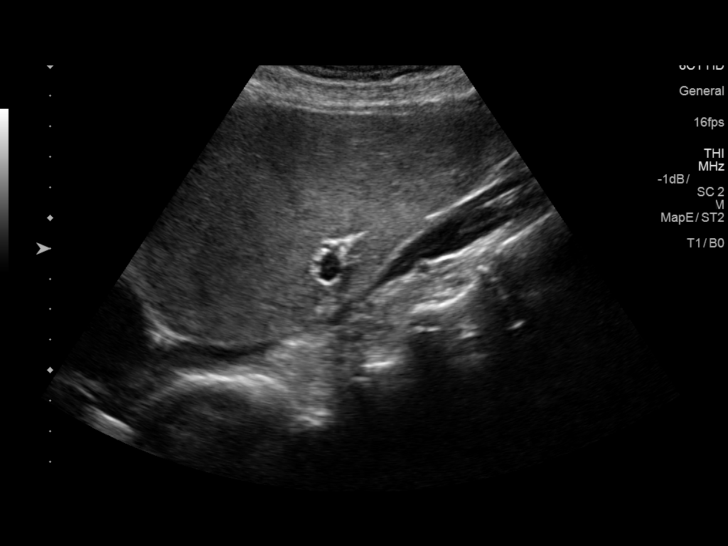
[im 143/264]
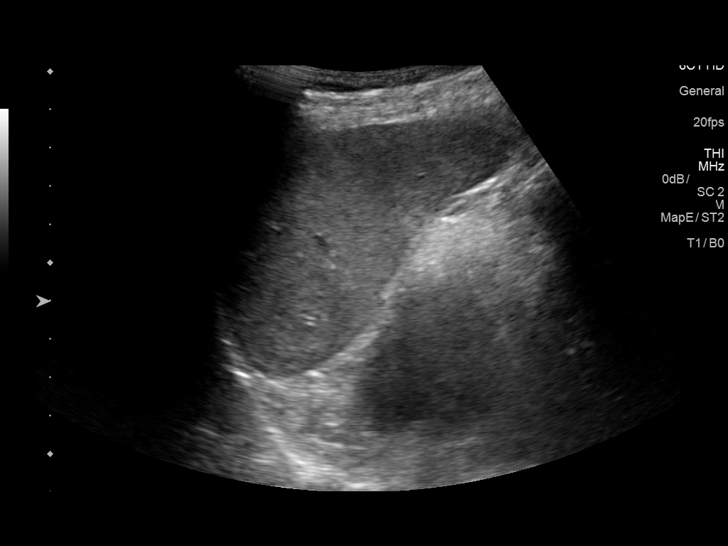
[im 165/264]
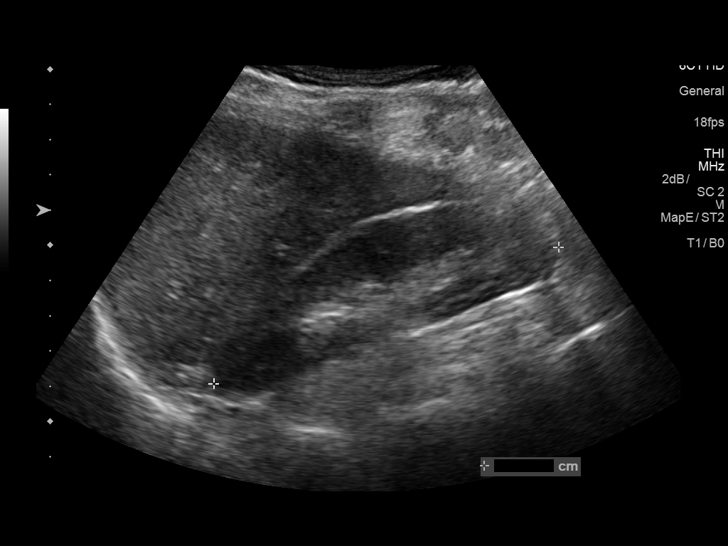
[im 176/264]
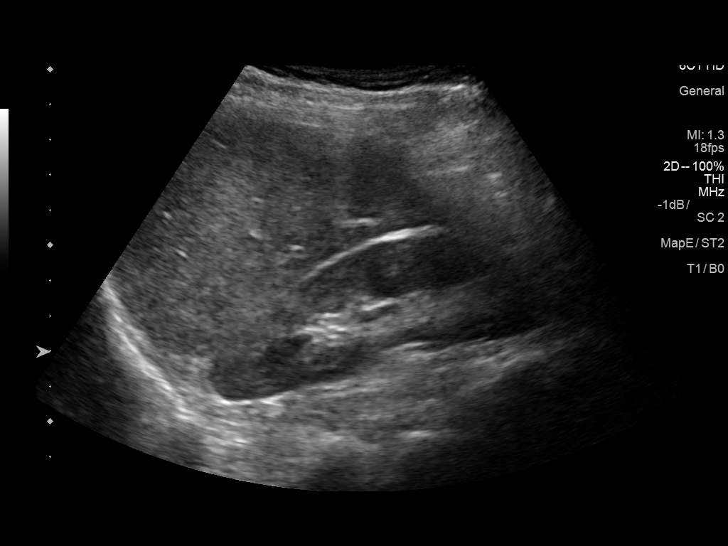
[im 198/264]
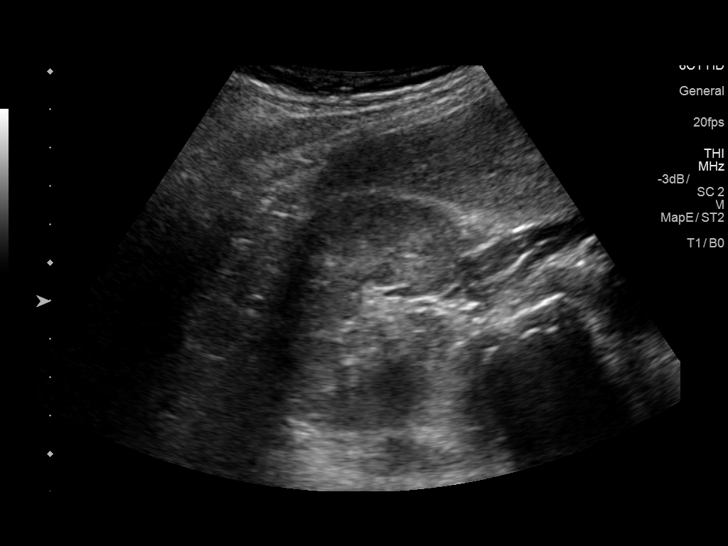
[im 220/264]
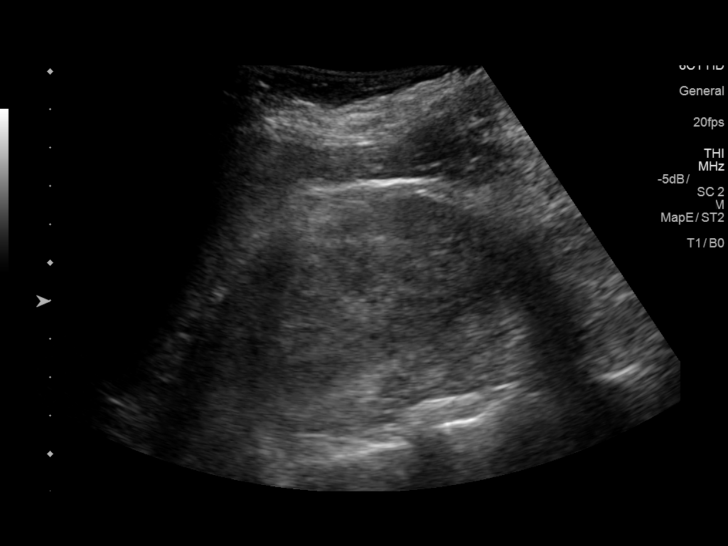
[im 242/264]
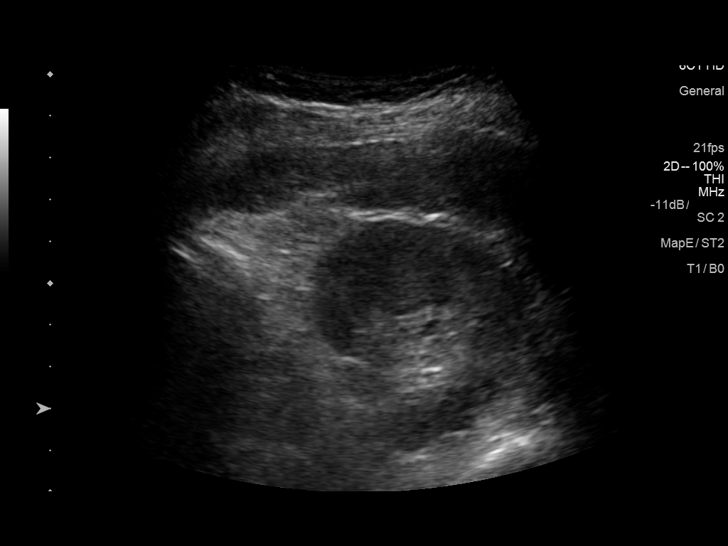
[im 264/264]
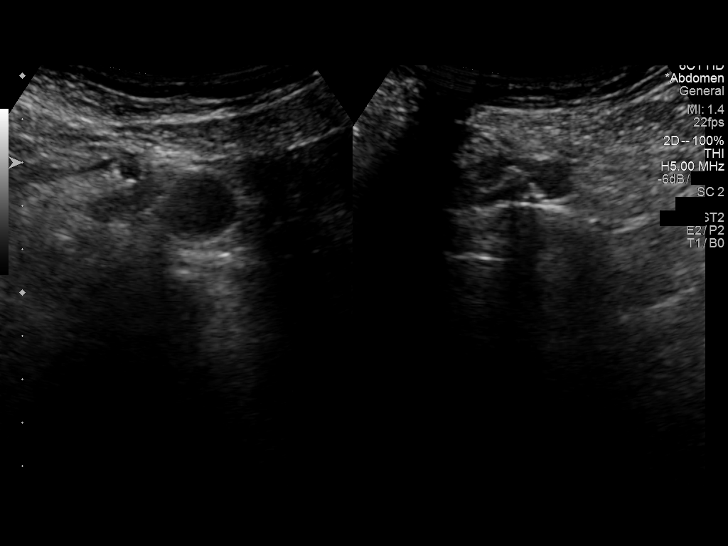

[14 of 25 positions shown; findings below may reference images not displayed]

FINDINGS: Gallbladder: No gallstones or wall thickening visualized. No
sonographic Murphy sign noted by sonographer.

Common bile duct: Diameter: 3 mm which is within normal limits.

Liver: No focal lesion identified. Within normal limits in
parenchymal echogenicity. Portal vein is patent on color Doppler
imaging with normal direction of blood flow towards the liver.

IVC: No abnormality visualized.

Pancreas: Not visualized due to overlying bowel gas.

Spleen: Size and appearance within normal limits.

Right Kidney: Length: 10.5 cm. Echogenicity within normal limits. No
mass or hydronephrosis visualized.

Left Kidney: Length: 9.8 cm. Echogenicity within normal limits. No
mass or hydronephrosis visualized.

Abdominal aorta: No aneurysm visualized.

Other findings: None.
IMPRESSION: Pancreas not visualized due to overlying bowel gas. No definite
abnormality seen in the abdomen.

## 2020-02-16 ENCOUNTER — Telehealth: Payer: Self-pay | Admitting: Neurology

## 2020-02-16 NOTE — Telephone Encounter (Signed)
Called [facility] to see if patient had been scheduled from the referral we sent. They reached out to the patient 3 times and left vm with no response back from patient.

## 2020-02-25 ENCOUNTER — Telehealth (HOSPITAL_COMMUNITY): Payer: Self-pay | Admitting: Interventional Cardiology

## 2020-02-25 NOTE — Telephone Encounter (Signed)
Patient called and cancelled echocardiogram for 03/01/2020 and does not wish to have. Order will be removed from the [facility].

## 2020-03-01 ENCOUNTER — Other Ambulatory Visit (HOSPITAL_COMMUNITY): Payer: BLUE CROSS/BLUE SHIELD

## 2020-03-10 ENCOUNTER — Telehealth: Payer: Self-pay | Admitting: Family Medicine

## 2020-03-10 NOTE — Telephone Encounter (Signed)
Scheduled [facility] for med refill

## 2020-03-10 NOTE — Telephone Encounter (Signed)
Patient's last chronic pain appointment was in June and this medication was discontinued.  If she is desiring refills on tramadol or any other controlled substance for her pain, we will need to get her scheduled prior to prescribing.  Again, please inform the patient this is not the medication we agreed she was to take after her last visit in June.  She was to take the oxycodone half a tab twice daily as needed.

## 2020-03-10 NOTE — Telephone Encounter (Signed)
Verified with [pt that she is taking tramadol for pain management. RF request for tamadol 50 mg LOV:12/03/19 Next ov: 03/22/20 Last written: 07/05/19 (60,5)  Please advise

## 2020-03-10 NOTE — Telephone Encounter (Signed)
[facility] is calling for refill of Tramadol, 90 day supply with 3 refills. Their return fax number is [phone] and call back number [phone]. Also gave address of [facility] [address] [place] [address].

## 2020-03-22 ENCOUNTER — Telehealth (INDEPENDENT_AMBULATORY_CARE_PROVIDER_SITE_OTHER): Payer: Medicare Other | Admitting: Family Medicine

## 2020-03-22 ENCOUNTER — Other Ambulatory Visit: Payer: Self-pay

## 2020-03-22 ENCOUNTER — Encounter: Payer: Self-pay | Admitting: Family Medicine

## 2020-03-22 DIAGNOSIS — I69891 Dysphagia following other cerebrovascular disease: Secondary | ICD-10-CM

## 2020-03-22 MED ORDER — TRAMADOL HCL 50 MG PO TABS: 50.0000 mg | ORAL_TABLET | Freq: Every day | ORAL | 1 refills | Status: AC

## 2020-03-22 MED ORDER — ESOMEPRAZOLE MAGNESIUM 40 MG PO CPDR: DELAYED_RELEASE_CAPSULE | ORAL | 3 refills | Status: DC

## 2020-03-22 NOTE — Progress Notes (Signed)
Patient Care Team    Relationship Specialty Notifications Start End  Ma Hillock, DO PCP - General Family Medicine  09/16/17   [name], MD Consulting Physician Cardiology  05/18/18   [name], DO Consulting Physician Neurology  05/18/18     SUBJECTIVE Chief Complaint  Patient presents with  . Follow-up    [facility]    HPI: Margaret Ortiz is a 59 y.o.  female presents today to discuss her pain management. Patient reports she has restarted the tramadol for pain. She takes 1 tab daily, in divided doses by her choice. She reports today she did not take the low dose 2.5 mg Roxicodone bc she was worried about the addictive properties. She did fill the oxycodone 12/10/2019.  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  She is continually working with physical therapy and occupational therapy to increase her strength and endurance.  She was referred last appointment again to physical therapy.  NSAIDs are contraindicated secondary to the use with her blood thinners.  She had been taking Tylenol at higher doses and as a consequence liver enzymes had elevated.   Tramadol 25 mg twice daily as needed had been prescribed for her.  Tramadol 25 mg was not enough to cover her pain, but tramadol 50 mg caused her to be dizzy and have side effects.   She has presented here a few times and asked for daily pain medicine and it has been explained to her she can not  take higher doses of NSAIDs or acetaminophen secondary to her chronic conditions.  Patient is prescribed gabapentin 600 mg twice daily.  Higher doses of gabapentin caused her sedation. She was prescribedvery low-dose hydrocodone 5-3 25 (half tab) twice daily and she reports it was helpful but she stopped taking it because it did have some Tylenol in it.  Although this has been explained to her that would be a very low  daily dose and safe.     ROS: See pertinent positives and negatives per HPI.  Patient Active Problem List   Diagnosis Date Noted  . Hematuria 05/31/2019  . Encounter for chronic pain management 01/22/2019  . Right spastic hemiplegia ([place]) 11/27/2018  . Elevated LFTs 11/20/2018  . 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 Unintentional weight loss 11/05/2018  . Fatigue 11/05/2018  . Bloody stool 11/05/2018  . Polyarthralgia 11/05/2018  . Chronic anticoagulation 11/05/2018  . Difficulty coping with disease 03/31/2018  . Hemiparesis of right dominant side as late effect of cerebral infarction ([place]) 09/17/2017  . Cardioembolic stroke ([place]) 97/67/3419  . 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 Cardiomyopathy ([place]) 09/17/2017  . H/O ischemic left MCA stroke 09/17/2017  . Cardiac LV ejection fraction 21-30%   . Graves disease   . Plaque psoriasis   . Neuropathy 05/19/2017    Social History   Tobacco Use  . Smoking status: Former Research scientist (life sciences)  . Smokeless tobacco: Never Used  Substance Use Topics  . Alcohol use: Never    Current Outpatient Medications:  .  atorvastatin (LIPITOR) 40 MG tablet, Take 40 mg by mouth daily., Disp: , Rfl:  .  Baclofen 5 MG TABS, Take 1 tablet by mouth 3 (three) times daily., Disp: , Rfl:  .  clobetasol cream (TEMOVATE) 3.79 %, Apply 1 application topically 2 (two) times daily., Disp: 30  g, Rfl: 5 .  ELIQUIS 5 MG TABS tablet, TAKE 1 TABLET TWICE A DAY, Disp: 180 tablet, Rfl: 1 .  esomeprazole (NEXIUM) 40 MG capsule, TAKE 1 CAPSULE DAILY AT 12 NOON, Disp: 90 capsule, Rfl: 3 .  gabapentin (NEURONTIN) 300 MG capsule, TAKE 2 CAPSULES IN THE MORNING AND 2 CAPSULES IN THE EVENING, Disp: 340 capsule, Rfl: 3 .  metoprolol succinate (TOPROL-XL) 25 MG 24 hr tablet, Take 12.5 mg by mouth daily., Disp: , Rfl:  .  traMADol (ULTRAM) 50 MG tablet, Take 1 tablet (50 mg total) by mouth daily.,  Disp: 90 tablet, Rfl: 1  Allergies  Allergen Reactions  . Dust Mite Extract   . Penicillins Rash    OBJECTIVE: BP 117/78   Pulse 62   Wt 123 lb (55.8 kg)   BMI 23.24 kg/m  Gen: Afebrile. No acute distress.  HENT: AT. [place].  Eyes:Pupils Equal Round Reactive to light, Extraocular movements intact,  Conjunctiva without redness, discharge or icterus. Neuro: Alert. Oriented x3  Psych: Normal affect, dress and demeanor. Normal speech. Normal thought content and judgment..    ASSESSMENT AND PLAN: Margaret Ortiz is a 59 y.o. 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  - NSaids contraindicated>Therefore the next level of pain management would have been tramadol-which she said was not effective at low-dose and caused undesirable side effects at increased dose.  >>it was discontinued and other meds prescribed> now desiring to placed back on Tramadol.  - Tramadol 50 mg -1 tab PO QD prescribed. Discussed with her today the importance of staying with one pain medication only.  - continue gabapentin which cannot be increased any higher secondary to side effects. -hydrocodone-acetaminophen 5-3 25 (half tab twice daily as needed) was tried.  Patient discontinued medication because it had Tylenol-despite counseling this would be a low daily dose.  Oxy 2.5 mg was prescribed- pt filled and did not take bc she was worried about addictive properties.  -She has been prescribed baclofen low-dose -she declines to take the medication. -[place] controlled substance database reviewed and appropriate 03/22/20 -NSAIDs contraindicated secondary to chronic  anticoagulation -Higher doses of Tylenol contraindicated secondary to elevated liver enzymes with the use.   Follow-up in 5.5 months, sooner if pain not controlled   No orders of the defined types were placed in this encounter.  Meds ordered this encounter  Medications  . traMADol (ULTRAM) 50 MG tablet    Sig: Take 1 tablet (50 mg total) by mouth daily.    Dispense:  90 tablet    Refill:  1  . esomeprazole (NEXIUM) 40 MG capsule    Sig: TAKE 1 CAPSULE DAILY AT 12 NOON    Dispense:  90 capsule    Refill:  [place], DO 03/22/2020

## 2020-04-25 NOTE — Telephone Encounter (Signed)
Left message for patient to call back.  She will need to schedule overdue f/u appt.  Need to find out why patient does not want to repeat echo.

## 2020-05-25 NOTE — Telephone Encounter (Signed)
Multiple attempts have been made to reach the patient to schedule her echo and follow up appt. Letter has been sent.

## 2021-04-02 ENCOUNTER — Other Ambulatory Visit: Payer: Self-pay | Admitting: Family Medicine

## 2021-04-02 DIAGNOSIS — I69891 Dysphagia following other cerebrovascular disease: Secondary | ICD-10-CM
